# Patient Record
Sex: Female | Born: 1975
Health system: Southern US, Community
[De-identification: ages and names within clinical notes are randomized; demographics above are authoritative.]

## PROBLEM LIST (undated history)

## (undated) DIAGNOSIS — Z9289 Personal history of other medical treatment: Secondary | ICD-10-CM

## (undated) DIAGNOSIS — D649 Anemia, unspecified: Secondary | ICD-10-CM

## (undated) DIAGNOSIS — Z87440 Personal history of urinary (tract) infections: Secondary | ICD-10-CM

## (undated) DIAGNOSIS — I10 Essential (primary) hypertension: Secondary | ICD-10-CM

## (undated) DIAGNOSIS — T7840XA Allergy, unspecified, initial encounter: Secondary | ICD-10-CM

## (undated) HISTORY — DX: Anemia, unspecified: D64.9

## (undated) HISTORY — PX: BREAST BIOPSY: SHX20

## (undated) HISTORY — DX: Personal history of other medical treatment: Z92.89

## (undated) HISTORY — DX: Personal history of urinary (tract) infections: Z87.440

## (undated) HISTORY — PX: GANGLION CYST EXCISION: SHX1691

## (undated) HISTORY — DX: Allergy, unspecified, initial encounter: T78.40XA

## (undated) HISTORY — DX: Essential (primary) hypertension: I10

## (undated) HISTORY — PX: CARPAL TUNNEL RELEASE: SHX101

---

## 2001-01-20 HISTORY — PX: ABDOMINAL HYSTERECTOMY: SHX81

## 2002-06-13 ENCOUNTER — Encounter: Payer: Self-pay | Admitting: Obstetrics and Gynecology

## 2002-06-20 ENCOUNTER — Encounter (INDEPENDENT_AMBULATORY_CARE_PROVIDER_SITE_OTHER): Payer: Self-pay

## 2002-06-20 ENCOUNTER — Inpatient Hospital Stay (HOSPITAL_COMMUNITY): Admission: RE | Admit: 2002-06-20 | Discharge: 2002-06-22 | Payer: Self-pay | Admitting: Obstetrics and Gynecology

## 2009-01-20 ENCOUNTER — Emergency Department (HOSPITAL_BASED_OUTPATIENT_CLINIC_OR_DEPARTMENT_OTHER): Admission: EM | Admit: 2009-01-20 | Discharge: 2009-01-20 | Payer: Self-pay | Admitting: Emergency Medicine

## 2009-03-01 ENCOUNTER — Emergency Department (HOSPITAL_BASED_OUTPATIENT_CLINIC_OR_DEPARTMENT_OTHER): Admission: EM | Admit: 2009-03-01 | Discharge: 2009-03-01 | Payer: Self-pay | Admitting: Emergency Medicine

## 2009-04-10 ENCOUNTER — Ambulatory Visit: Payer: Self-pay | Admitting: Family

## 2009-04-10 ENCOUNTER — Ambulatory Visit: Payer: Self-pay | Admitting: Diagnostic Radiology

## 2009-04-10 ENCOUNTER — Ambulatory Visit (HOSPITAL_BASED_OUTPATIENT_CLINIC_OR_DEPARTMENT_OTHER): Admission: RE | Admit: 2009-04-10 | Discharge: 2009-04-10 | Payer: Self-pay | Admitting: Internal Medicine

## 2009-04-10 ENCOUNTER — Telehealth: Payer: Self-pay | Admitting: Family

## 2009-04-10 DIAGNOSIS — I10 Essential (primary) hypertension: Secondary | ICD-10-CM | POA: Insufficient documentation

## 2009-04-11 ENCOUNTER — Telehealth: Payer: Self-pay | Admitting: Family

## 2009-04-12 ENCOUNTER — Ambulatory Visit: Payer: Self-pay

## 2009-04-12 ENCOUNTER — Encounter: Payer: Self-pay | Admitting: Family

## 2009-04-30 ENCOUNTER — Telehealth: Payer: Self-pay | Admitting: Family

## 2009-05-02 ENCOUNTER — Telehealth: Payer: Self-pay | Admitting: Family

## 2009-05-10 ENCOUNTER — Ambulatory Visit: Payer: Self-pay | Admitting: Family

## 2009-05-11 ENCOUNTER — Telehealth: Payer: Self-pay | Admitting: Family

## 2009-05-11 ENCOUNTER — Encounter: Payer: Self-pay | Admitting: Family

## 2009-05-11 LAB — CONVERTED CEMR LAB
Chlamydia, DNA Probe: NEGATIVE
GC Probe Amp, Genital: NEGATIVE
Trich, Wet Prep: NONE SEEN
WBC, Wet Prep HPF POC: NONE SEEN
Yeast Wet Prep HPF POC: NONE SEEN

## 2009-05-14 ENCOUNTER — Telehealth (INDEPENDENT_AMBULATORY_CARE_PROVIDER_SITE_OTHER): Payer: Self-pay | Admitting: *Deleted

## 2009-05-15 ENCOUNTER — Telehealth: Payer: Self-pay | Admitting: Family

## 2009-05-16 ENCOUNTER — Encounter: Payer: Self-pay | Admitting: Family

## 2009-06-04 ENCOUNTER — Ambulatory Visit: Payer: Self-pay | Admitting: Family

## 2009-06-04 DIAGNOSIS — R5383 Other fatigue: Secondary | ICD-10-CM

## 2009-06-04 DIAGNOSIS — L708 Other acne: Secondary | ICD-10-CM | POA: Insufficient documentation

## 2009-06-04 DIAGNOSIS — R5381 Other malaise: Secondary | ICD-10-CM | POA: Insufficient documentation

## 2009-06-04 DIAGNOSIS — R7309 Other abnormal glucose: Secondary | ICD-10-CM | POA: Insufficient documentation

## 2009-06-04 LAB — CONVERTED CEMR LAB
Free T4: 0.85 ng/dL (ref 0.80–1.80)
Hgb A1c MFr Bld: 5.7 % — ABNORMAL HIGH (ref <5.7)
T3, Free: 2.8 pg/mL (ref 2.3–4.2)
TSH: 0.497 microintl units/mL (ref 0.350–4.500)
Testosterone: 51.58 ng/dL (ref 10–70)

## 2009-06-05 ENCOUNTER — Encounter: Payer: Self-pay | Admitting: Family

## 2009-06-29 ENCOUNTER — Telehealth: Payer: Self-pay | Admitting: Internal Medicine

## 2009-07-09 ENCOUNTER — Ambulatory Visit: Payer: Self-pay | Admitting: Family

## 2009-07-11 ENCOUNTER — Telehealth: Payer: Self-pay | Admitting: Family

## 2009-07-12 ENCOUNTER — Encounter: Payer: Self-pay | Admitting: Family

## 2009-07-13 ENCOUNTER — Encounter: Payer: Self-pay | Admitting: Family

## 2009-08-08 ENCOUNTER — Ambulatory Visit: Payer: Self-pay | Admitting: Family

## 2009-09-03 ENCOUNTER — Ambulatory Visit: Payer: Self-pay | Admitting: Family

## 2009-09-03 LAB — CONVERTED CEMR LAB
Trich, Wet Prep: NONE SEEN
WBC, Wet Prep HPF POC: NONE SEEN
Yeast Wet Prep HPF POC: NONE SEEN

## 2009-09-05 ENCOUNTER — Telehealth: Payer: Self-pay | Admitting: Family

## 2009-09-06 ENCOUNTER — Telehealth: Payer: Self-pay | Admitting: Family

## 2009-10-17 ENCOUNTER — Telehealth: Payer: Self-pay | Admitting: Family

## 2009-10-19 ENCOUNTER — Ambulatory Visit: Payer: Self-pay | Admitting: Family

## 2009-10-19 LAB — CONVERTED CEMR LAB
Candida species: NEGATIVE
Gardnerella vaginalis: POSITIVE — AB
Trichomonal Vaginitis: NEGATIVE

## 2009-10-22 ENCOUNTER — Telehealth: Payer: Self-pay | Admitting: Family

## 2009-11-22 ENCOUNTER — Telehealth: Payer: Self-pay | Admitting: Family

## 2009-12-21 ENCOUNTER — Telehealth: Payer: Self-pay | Admitting: Family

## 2010-02-10 ENCOUNTER — Encounter: Payer: Self-pay | Admitting: Obstetrics and Gynecology

## 2010-02-11 ENCOUNTER — Telehealth: Payer: Self-pay | Admitting: Family

## 2010-02-17 LAB — CONVERTED CEMR LAB
ALT: 13 units/L (ref 0–35)
AST: 17 units/L (ref 0–37)
Albumin: 4.1 g/dL (ref 3.5–5.2)
Alkaline Phosphatase: 56 units/L (ref 39–117)
BUN: 9 mg/dL (ref 6–23)
Basophils Absolute: 0 10*3/uL (ref 0.0–0.1)
Basophils Relative: 0 % (ref 0–1)
CO2: 21 meq/L (ref 19–32)
Calcium: 9.2 mg/dL (ref 8.4–10.5)
Chloride: 103 meq/L (ref 96–112)
Creatinine, Ser: 0.76 mg/dL (ref 0.40–1.20)
Eosinophils Absolute: 0.1 10*3/uL (ref 0.0–0.7)
Eosinophils Relative: 1 % (ref 0–5)
Glucose, Bld: 103 mg/dL — ABNORMAL HIGH (ref 70–99)
HCT: 36.4 % (ref 36.0–46.0)
Hemoglobin: 12.5 g/dL (ref 12.0–15.0)
Lymphocytes Relative: 35 % (ref 12–46)
Lymphs Abs: 2.7 10*3/uL (ref 0.7–4.0)
MCHC: 34.3 g/dL (ref 30.0–36.0)
MCV: 89.2 fL (ref 78.0–100.0)
Monocytes Absolute: 0.5 10*3/uL (ref 0.1–1.0)
Monocytes Relative: 6 % (ref 3–12)
Neutro Abs: 4.5 10*3/uL (ref 1.7–7.7)
Neutrophils Relative %: 58 % (ref 43–77)
Platelets: 338 10*3/uL (ref 150–400)
Potassium: 3.8 meq/L (ref 3.5–5.3)
Pro B Natriuretic peptide (BNP): 65.8 pg/mL (ref 0.0–100.0)
RBC: 4.08 M/uL (ref 3.87–5.11)
RDW: 12.9 % (ref 11.5–15.5)
Sodium: 137 meq/L (ref 135–145)
TSH: 0.509 microintl units/mL (ref 0.350–4.500)
Total Bilirubin: 0.2 mg/dL — ABNORMAL LOW (ref 0.3–1.2)
Total Protein: 7.2 g/dL (ref 6.0–8.3)
Trich, Wet Prep: NONE SEEN
WBC, Wet Prep HPF POC: NONE SEEN
WBC: 7.8 10*3/uL (ref 4.0–10.5)
Yeast Wet Prep HPF POC: NONE SEEN

## 2010-02-19 NOTE — Assessment & Plan Note (Signed)
Summary: weight gain /facial break out/  ?HRT/hea   Vital Signs:  Patient profile:   35 year old female Height:      65 inches Weight:      199.50 pounds BMI:     33.32 Temp:     98.5 degrees F oral Pulse rate:   84 / minute Pulse rhythm:   regular Resp:     12 per minute BP sitting:   132 / 78  (right arm) Cuff size:   regular  Vitals Entered By: Mervin Kung CMA (Jun 04, 2009 1:15 PM) CC: room 4  Pt would like hormones checked. Still having facial breakouts, mood swings and fluctuations with weight. Also states that bacterial infection is improved but not completely resolved. Is Patient Diabetic? No   CC:  room 4  Pt would like hormones checked. Still having facial breakouts and mood swings and fluctuations with weight. Also states that bacterial infection is improved but not completely resolved.Marland Kitchen  History of Present Illness: [name] is a pleasant 35 year old female who presents today with several complaints:  1)mood swings- feels teary.  Wants to sleep, wants to be alone often,   + fatigue.  + anxiety, occasional panic attacks. These symptoms have been present for several months. Denies suicide ideation.  2)Acne- she is seeing dermatology and was placed on OCP.  This is not helping her acne.  She is using an OTC wash.  "I feel like I am a teenager again!"  3)Bacterial Vaginosis- Last visit wet prep noted + clue cells.  She was treated with metronidazole tabs (can't affort co-pay for metrogel).  Noted improvment in her symptoms, but not resolved.    4) Weight fluctuations- notes that her weight can vary up to 7 pounds within a few days.  Eating healthier, exercising, denies LE edema.    Allergies: 1)  ! Ace Inhibitors  Physical Exam  General:  Well-developed,well-nourished,in no acute distress; alert,appropriate and cooperative throughout examination Head:  Normocephalic and atraumatic without obvious abnormalities. No apparent alopecia or balding. Lungs:  Normal  respiratory effort, chest expands symmetrically. Lungs are clear to auscultation, no crackles or wheezes. Heart:  Normal rate and regular rhythm. S1 and S2 normal without gallop, murmur, click, rub or other extra sounds. Extremities:  No LE edema Skin:  + acne noted on face with some associated erythema.   Impression & Recommendations:  Problem # 1:  ANXIETY DEPRESSION (ICD-300.4) Assessment New Suspect that patient is suffering from symptoms of both Depression and Anxiety.  Will likely benefit from addition of SSRI.  Pt instructed on common side effects and red flags per pt. instructions.  Plan f/u in 1 month.   Problem # 2:  VAGINITIS (ICD-616.10) Assessment: Improved Improved but not clinically resolved.  Will give addition rx of metronidazole.  The following medications were removed from the medication list:    Metronidazole 500 Mg Tabs (Metronidazole) ..... One tab by mouth three times a day x 7 days Her updated medication list for this problem includes:    Metronidazole 500 Mg Tabs (Metronidazole) ..... One tablet by mouth three times a day x 7 days  Problem # 3:  OTHER ACNE (ICD-706.1) Assessment: Deteriorated Will add Benzamycin gel.  Cont OCP, will check testosterone level.  The following medications were removed from the medication list:    Metronidazole 500 Mg Tabs (Metronidazole) ..... One tab by mouth three times a day x 7 days Her updated medication list for this problem includes:  Metronidazole 500 Mg Tabs (Metronidazole) ..... One tablet by mouth three times a day x 7 days    Benzoyl Peroxide-erythromycin 5-3 % Gel (Benzoyl peroxide-erythromycin) .Marland Kitchen... Wash and dry skin, then apply gel twice daily  Orders: T-Testosterone; Total [phone])  Problem # 4:  FATIGUE (ICD-780.79) Assessment: New May be related to depression.  Also TSH was WNL but borderline low last draw.  Will repeat today along with free T3/T4.  Will also check A1C as sugar was slightly elevated  last visit.  Per our records weight overall is actually down.    Complete Medication List: 1)  Hydrochlorothiazide 25 Mg Tabs (Hydrochlorothiazide) .... Take 1 tablet by mouth once a day 2)  Daily Multi Tabs (Multiple vitamins-minerals) .... Take 1 tablet by mouth once a day 3)  Ortho Tri-cyclen (28) 0.18/0.215/0.25 Mg-35 Mcg Tabs (Norgestim-eth estrad triphasic) .... Take as direct 4)  Celexa 20 Mg Tabs (Citalopram hydrobromide) .... One half tablet by mouth daily x 1 week 5)  Metronidazole 500 Mg Tabs (Metronidazole) .... One tablet by mouth three times a day x 7 days 6)  Benzoyl Peroxide-erythromycin 5-3 % Gel (Benzoyl peroxide-erythromycin) .... Wash and dry skin, then apply gel twice daily  Other Orders: T-TSH ([id number]) T-T3, Free [id number]) T-T4, Free [phone] [id number]) T-Hgb A1C ([id number])  Patient Instructions: 1)  Citalopram: please start 1/2 tablet one a day for one week, then increase to a full tablet. 2)  It will likely take several weeks before you will notice improvement. 3)  Side effects of this medicine may include drowsiness or nausea.  If this becomes an issue for you call for further instructions. 4)  Very rarely people may develop suicidal thoughts when taking these types of medicines- should this happen to you, discontinue medication and go directly to the emergency room. 5)  Please arrange a follow up appointment in 1 month  Prescriptions: BENZOYL PEROXIDE-ERYTHROMYCIN 5-3 % GEL (BENZOYL PEROXIDE-ERYTHROMYCIN) Wash and dry skin, then apply gel twice daily  #1 x 5   Entered and Authorized by:   Lemont Fillers FNP   Signed by:   Lemont Fillers FNP on 06/04/2009   Method used:   Electronically to        [facility]. [phone]* (retail)       [address]       [place], [place]  [address]       Ph: [phone]       Fax: [phone]   RxID:   [id number] METRONIDAZOLE 500 MG TABS (METRONIDAZOLE) one tablet by mouth three times a day x 7 days   #21 x 0   Entered and Authorized by:   Lemont Fillers FNP   Signed by:   Lemont Fillers FNP on 06/04/2009   Method used:   Electronically to        [facility] [phone]* (retail)       [address]       [place], [place]  [address]       Ph: [phone]       Fax: [phone]   RxID:   [id number] CELEXA 20 MG TABS (CITALOPRAM HYDROBROMIDE) one half tablet by mouth daily x 1 week  #30 x 1   Entered and Authorized by:   Lemont Fillers FNP   Signed by:   Lemont Fillers FNP on 06/04/2009   Method used:   Electronically to        [facility] [address] [id number]* (retail)       [address]       [place], [place]  [address]       Ph: [phone]       Fax: [phone]   RxID:   [id number]   Current Allergies (reviewed today): ! ACE INHIBITORS

## 2010-02-19 NOTE — Progress Notes (Signed)
Summary: refill--metronidazole  Phone Note Refill Request Message from:  Patient on November 22, 2009 10:22 AM  Refills Requested: Medication #1:  METRONIDAZOLE 500 MG TABS one tablet by mouth two times a day x 7 days.   Dosage confirmed as above?Dosage Confirmed Pt states she is having same symptoms as before and would like refill sent to pharmacy. "Does not feel she needs to be seen as her symptoms are the same". Please advise.  Initial call taken by: Mervin Kung CMA Duncan Dull),  November 22, 2009 10:24 AM  Follow-up for Phone Call        Metronidazole was sent to her pharmacy.  If symptoms not resolved in 1 week she will need to be seen in office. Follow-up by: Lemont Fillers FNP,  November 22, 2009 10:45 AM  Additional Follow-up for Phone Call Additional follow up Details #1::        Attempted to reach pt and received message that service is temporarily unavailable. Nicki Guadalajara Fergerson CMA Duncan Dull)  November 22, 2009 11:35 AM     Additional Follow-up for Phone Call Additional follow up Details #2::    Pt notified and voices understanding. Nicki Guadalajara Fergerson CMA Duncan Dull)  November 22, 2009 2:59 PM   Prescriptions: METRONIDAZOLE 500 MG TABS (METRONIDAZOLE) one tablet by mouth two times a day x 7 days  #14 x 0   Entered and Authorized by:   Lemont Fillers FNP   Signed by:   Lemont Fillers FNP on 11/22/2009   Method used:   Electronically to        [facility] [phone]* (retail)       [address]       [place], [place]  [address]       Ph: [phone]       Fax: [phone]   RxID:   [id number]

## 2010-02-19 NOTE — Miscellaneous (Signed)
Summary: Orders Update  Clinical Lists Changes  Orders: Added new Test order of Venous Duplex Lower Extremity (Venous Duplex Lower) - Signed 

## 2010-02-19 NOTE — Progress Notes (Signed)
Summary: Lab Results  Phone Note Call from Patient Call back at Work Phone [phone]   Caller: Patient Call For: Lemont Fillers FNP Reason for Call: Lab or Test Results Summary of Call: patient called and left voice message requesting lab results. Initial call taken by: Glendell Docker CMA,  September 05, 2009 11:57 AM  Follow-up for Phone Call        Pls call lab to request wet prep results.  Follow-up by: Lemont Fillers FNP,  September 05, 2009 12:02 PM  Additional Follow-up for Phone Call Additional follow up Details #1::        Lab results have not come back yet, results should be available by tomorrow or Friday Additional Follow-up by: Brenton Grills MA,  September 05, 2009 12:09 PM    Additional Follow-up for Phone Call Additional follow up Details #2::    Called pt. She is feeling pretty good- only mild vaginal irritation. Will call back when results become available. Follow-up by: Lemont Fillers FNP,  September 05, 2009 12:13 PM

## 2010-02-19 NOTE — Progress Notes (Signed)
Summary: Medication effects on employment drug screening  Phone Note Call from Patient   Caller: Patient Call For: Lemont Fillers FNP Summary of Call: Pt has signed a HIPAA release form 4.8.11, pls call Kandice Hams at [phone] to advise if pt's current medications will dilute a urine drug test. Initial call taken by: Lannette Donath,  May 02, 2009 2:27 PM  Follow-up for Phone Call        If employer has not yet done so, they will need to fax [place] HIPPA release form prior to [place] releasing medical info. Follow-up by: Lemont Fillers FNP,  May 02, 2009 3:38 PM  Additional Follow-up for Phone Call Additional follow up Details #1::        Pt states she dropped off HIPAA paperwork Friday 11th, I left a message for Medical Records at [facility] to call me back to see if they have her form, I'm not sure where else it would be. I'll send this to Marj too to see if she knows Diane Tomerlin  May 02, 2009 4:15 PM    Additional Follow-up for Phone Call Additional follow up Details #2::    Called patient.  She tells me that she was told by her employer that her urine was found to be diluted.  She signed medical release form and given me permission to call Kandice Hams and let her know that she is on a diurectic and that this may dilute her urine.  I have called Ms. Pricilla Holm and advised as such.  Follow-up by: Lemont Fillers FNP,  May 02, 2009 5:04 PM

## 2010-02-19 NOTE — Miscellaneous (Signed)
Summary: Controlled Substance Agreement  Controlled Substance Agreement   Imported By: Lanelle Bal 07/18/2009 08:51:00  _____________________________________________________________________  External Attachment:    Type:   Image     Comment:   External Document

## 2010-02-19 NOTE — Progress Notes (Signed)
Summary: yeast inf?  Phone Note Call from Patient Call back at [phone]  wk   Caller: Patient Call For: Lemont Fillers FNP Summary of Call: Pt called stating that she has been taking an abx from her dermatologist and she thinks she has yeast infection now.  Requesting rx for Diflucan.  Please advise.  Mervin Kung CMA  June 29, 2009 11:20 AM   Follow-up for Phone Call        Pt notified that rx had been sent to pharmacy.  Nicki Guadalajara Fergerson CMA  June 29, 2009 2:55 PM     New/Updated Medications: FLUCONAZOLE 150 MG TABS (FLUCONAZOLE) one by mouth once daily Prescriptions: FLUCONAZOLE 150 MG TABS (FLUCONAZOLE) one by mouth once daily  #2 x 0   Entered and Authorized by:   D. Thomos Lemons DO   Signed by:   D. Thomos Lemons DO on 06/29/2009   Method used:   Electronically to        [facility] [phone]* (retail)       [address]       [place], [place]  [address]       Ph: [phone]       Fax: [phone]   RxID:   [id number]

## 2010-02-19 NOTE — Progress Notes (Signed)
Summary: needs appt.  Phone Note Call from Patient   Caller: Patient Call For: Lemont Fillers FNP Summary of Call: Pt. requesting refill on Fluconazole.  She states that her symptoms have not gone away.  We gave her an rx. on 04/10/09 with 1 refill and she has used that refill as well.  Please advise?  Mervin Kung CMA  April 30, 2009 1:19 PM   Follow-up for Phone Call        Did you complete metronidazole?  If so she should return for follow up- wet prep showed bacteria rather than yeast last visit.   Follow-up by: Lemont Fillers FNP,  April 30, 2009 1:25 PM  Additional Follow-up for Phone Call Additional follow up Details #1::        Left message on machine to return call. Refill denial sent to pharmacy.  Mervin Kung CMA  April 30, 2009 1:53 PM   Advised pt. she needs to f/'u in the office since she still has symptoms.  Appt. made for 05/01/09 with Irja Wheless at 1:15pm.  Mervin Kung CMA  April 30, 2009 2:34 PM

## 2010-02-19 NOTE — Progress Notes (Signed)
Summary: refill Metronidazole  Phone Note Refill Request Call back at [phone] Message from:  Pharmacy on October 17, 2009 8:32 AM  Refills Requested: Medication #1:  METRONIDAZOLE 500 MG TABS one tablet by mouth two times a day x 7 days- do not drink alcohol while on this medication.   Dosage confirmed as above?Dosage Confirmed   Last Refilled: 09/07/2009 Left message for pt to return my call. If needs refill on antibiotic, pt will need to be seen. Nicki Guadalajara Fergerson CMA Duncan Dull)  October 17, 2009 8:33 AM    Follow-up for Phone Call        patient returned call stated her symptoms have not completely resolved she wanted to know if treatment could be repeated Follow-up by: Glendell Docker CMA,  October 17, 2009 1:05 PM  Additional Follow-up for Phone Call Additional follow up Details #1::        I will need to repeat a wet prep to determine what medication will best treat her symptoms.  Please have pt schedule a f/u visit. Additional Follow-up by: Lemont Fillers FNP,  October 17, 2009 1:12 PM    Additional Follow-up for Phone Call Additional follow up Details #2::    Advised pt per [facility] instructions. Pt scheduled f/u for 9/30 @ 9am. Mervin Kung CMA Duncan Dull)  October 18, 2009 9:55 AM

## 2010-02-19 NOTE — Progress Notes (Signed)
Summary: [facility] Refill  Phone Note Call from Patient Call back at Work Phone [phone]   Caller: Patient Call For: Lemont Fillers FNP Summary of Call: patient called and left voice message requesting a refill on  HCTZ. She states she is out of her medication and she is needing refills to her pharmacy Initial call taken by: Glendell Docker CMA,  May 15, 2009 1:03 PM  Follow-up for Phone Call        pls call pt and notify her that rx has been sent to pharmacy Follow-up by: Lemont Fillers FNP,  May 15, 2009 1:18 PM    Prescriptions: HYDROCHLOROTHIAZIDE 25 MG TABS (HYDROCHLOROTHIAZIDE) Take 1 tablet by mouth once a day  #30 x 3   Entered and Authorized by:   Lemont Fillers FNP   Signed by:   Lemont Fillers FNP on 05/15/2009   Method used:   Electronically to        [facility] [phone]* (retail)       [address]       [place], [place]  [address]       Ph: [phone]       Fax: [phone]   RxID:   [id number]   Appended Document: [facility] Refill Left message on machine that rx was sent to pharmacy and to call back if she has any questions.

## 2010-02-19 NOTE — Progress Notes (Signed)
----   Converted from flag ---- ---- 05/14/2009 10:42 AM, Lannette Donath wrote: Pls call Tonya back at [facility] re. lab test [phone]. ------------------------------ Spoke to [facility] @ [facility] and confirmed culture should be for GC/chlamydia per office note of 05/10/09.

## 2010-02-19 NOTE — Progress Notes (Signed)
  Phone Note Outgoing Call   Summary of Call: left message for patient to pick up rx at pharmacy. Initial call taken by: Lemont Fillers FNP,  May 11, 2009 11:43 AM    New/Updated Medications: METRONIDAZOLE 500 MG TABS (METRONIDAZOLE) one tab by mouth three times a day x 7 days Prescriptions: METRONIDAZOLE 500 MG TABS (METRONIDAZOLE) one tab by mouth three times a day x 7 days  #21 x 0   Entered and Authorized by:   Lemont Fillers FNP   Signed by:   Lemont Fillers FNP on 05/11/2009   Method used:   Electronically to        [facility] [phone]* (retail)       [address]       [place], [place]  [address]       Ph: [phone]       Fax: [phone]   RxID:   [id number]

## 2010-02-19 NOTE — Assessment & Plan Note (Signed)
Summary: STILL HAS INFECTION/DT   Vital Signs:  Patient profile:   35 year old female Weight:      204.75 pounds BMI:     34.20 O2 Sat:      100 % on Room air Temp:     98.2 degrees F oral Pulse rate:   86 / minute Pulse rhythm:   regular Resp:     16 per minute BP sitting:   124 / 82  (right arm) Cuff size:   large  Vitals Entered By: Glendell Docker CMA (May 10, 2009 1:12 PM)  O2 Flow:  Room air CC: Rm 2- vaginal discharge and odor Comments vaginal discharge cottage chees like, ongoing for the past week   CC:  Rm 2- vaginal discharge and odor.  History of Present Illness: [name] is a 35 year old female who presents today with complaint of thick white vaginal discharge, fishy odor.  She was treated in the end of March with metronidazole for BV.  Noted initially that these symptoms improved.   Denies abdominal pain or dyspareunia.    Allergies: 1)  ! Ace Inhibitors  Physical Exam  General:  Well-developed,well-nourished,in no acute distress; alert,appropriate and cooperative throughout examination Genitalia:  normal introitus and no external lesions.  Thin white discharge is noted.  + Fishy odor.   Psych:  Cognition and judgment appear intact. Alert and cooperative with normal attention span and concentration. No apparent delusions, illusions, hallucinations   Impression & Recommendations:  Problem # 1:  VAGINITIS (ICD-616.10) Assessment Deteriorated Wet prep and Gc/Chlamydia DNA probe sent to lab.  Will plan to treat accordingly.  The following medications were removed from the medication list:    Metronidazole 500 Mg Tabs (Metronidazole) ..... One tab by mouth two times a day x 7 days  Complete Medication List: 1)  Hydrochlorothiazide 25 Mg Tabs (Hydrochlorothiazide) .... Take 1 tablet by mouth once a day 2)  Daily Multi Tabs (Multiple vitamins-minerals) .... Take 1 tablet by mouth once a day 3)  Nouritress (hair Vitamin)  .... Take 1 tablet by mouth once a  day 4)  Ortho Tri-cyclen (28) 0.18/0.215/0.25 Mg-35 Mcg Tabs (Norgestim-eth estrad triphasic) .... Take as direct  Patient Instructions: 1)  I will call you with your results. 2)  Have a nice Easter.  Current Allergies (reviewed today): ! ACE INHIBITORS  Appended Document: Orders Update    Clinical Lists Changes  Orders: Added new Test order of T-Wet Prep by Molecular Probe [phone]) - Signed Added new Test order of T-Culture, GC Screen [phone]) - Signed Added new Service order of Specimen Handling ([id number]) - Signed

## 2010-02-19 NOTE — Progress Notes (Signed)
Summary: Cymbalta prior auth.  Phone Note Call from Patient Call back at [phone]   Caller: Patient Call For: Lemont Fillers FNP Summary of Call: Pt states her insurance does not cover Cymbalta and would like replacement. Please advise.  Mervin Kung CMA  July 11, 2009 1:12 PM   Follow-up for Phone Call        Spoke to [place] at [phone] [facility]). Cymbalta is covered for once daily dosing. Prior auth for Cymbalta 30mg  bid has been initiated. Case # [id number]; form will be faxed.  Mervin Kung CMA  July 11, 2009 2:39 PM   Additional Follow-up for Phone Call Additional follow up Details #1::        Spoke to pt. and advised her of prior auth process.  Notified pt. we have samples she can pick up at the front desk to take until insurance reaches a determination.  Mervin Kung CMA  July 11, 2009 2:52 PM     Additional Follow-up for Phone Call Additional follow up Details #2::    Form completed and faxed to [employer] at [phone]. Awaiting determination.  Mervin Kung CMA  July 12, 2009 2:32 PM     Additional Follow-up for Phone Call Additional follow up Details #3:: Details for Additional Follow-up Action Taken: Per Tyler Deis (pharmacist) @ [employer], Cymbalta 30mg  two times a day has been approved until 08/13/09.  If pt. tolerates two times a day dose then ins. will cover 60mg  once daily without need for prior auth. Left message of approval on [facility] voicemail. Advised pt. of approval and scheduled pt. a 1 month f/u for 08/08/09 @ 2:15pm.  Mervin Kung CMA  July 17, 2009 9:08 AM    Appended Document: Cymbalta prior auth. Received determination letter from [employer] stating Cymbalta 30mg  two times a day has been approved until 08/11/09; not 08/13/09 as stated above.

## 2010-02-19 NOTE — Assessment & Plan Note (Signed)
Summary: 1 MONTH FU/DT   Vital Signs:  Patient profile:   35 year old female Weight:      194.50 pounds BMI:     32.48 O2 Sat:      98 % on Room air Temp:     98.0 degrees F oral Pulse rate:   64 / minute Pulse rhythm:   regular Resp:     16 per minute BP sitting:   114 / 80  (right arm) Cuff size:   regular  Vitals Entered By: Glendell Docker CMA (July 09, 2009 9:50 AM)  O2 Flow:  Room air CC: Rm 5-1 Month Follow  Comments  medications reviewed, refill on Celexa, completed Fluconazole, and metronidazole, no longer taking Ortho Tri Cyclen   CC:  Rm 5-1 Month Follow .  History of Present Illness: [name] is a 35 year old female who presents today for follow up of her depression.  She notes some improvement since starting celexa, but is still having crying spells on a daily basis.  She often wishes to be alone.  Notes + anxiety at times (several times a week).  These episodes of anxiety are accompanied by crying and heart racing.  She reports that these episodes happens most often at home. Overall, she feels that her home life and work life are going pretty well.  She notes some stress due to her teenage children.  Patient denies suicide ideation.  Allergies: 1)  ! Ace Inhibitors  Physical Exam  General:  Well-developed,well-nourished,in no acute distress; alert,appropriate and cooperative throughout examination Psych:  Cognition and judgment appear intact. Alert and cooperative with normal attention span and concentration. No apparent delusions, illusions, hallucinations   Impression & Recommendations:  Problem # 1:  ANXIETY DEPRESSION (ICD-300.4) Assessment Improved Slight improvement in patient's depression and anxiety, however improvement is not yet satisfactory.  Will plan to change citalopram to Cymbalta and add in as needed klonopin to be used for anxiety.  Plan f/u in 1 month.  If no improvement at that time, will consider referral to psychiatry.  We did discuss  possibility of referral to counseling, however patient notes that she has undergone counseling in the past and that she did not find it to be very helpful.  She declines therapy referral at this time.  15 minutes was spent with patient.  Greater than 50% of this time was spent counseling patient on her depression and anxiety.    Complete Medication List: 1)  Hydrochlorothiazide 25 Mg Tabs (Hydrochlorothiazide) .... Take 1 tablet by mouth once a day 2)  Daily Multi Tabs (Multiple vitamins-minerals) .... Take 1 tablet by mouth once a day 3)  Ortho Tri-cyclen (28) 0.18/0.215/0.25 Mg-35 Mcg Tabs (Norgestim-eth estrad triphasic) .... Take as direct 4)  Celexa 20 Mg Tabs (Citalopram hydrobromide) .... One half tablet by mouth daily x 1 week 5)  Benzoyl Peroxide-erythromycin 5-3 % Gel (Benzoyl peroxide-erythromycin) .... Wash and dry skin, then apply gel twice daily 6)  Fluconazole 150 Mg Tabs (Fluconazole) .... One by mouth once daily 7)  Cymbalta 30 Mg Cpep (Duloxetine hcl) .... One tablet by mouth daily for one week, then increase to 2 tablet by mouth daily on second week 8)  Klonopin 0.5 Mg Tabs (Clonazepam) .... One tablet by mouth three times daily as needed for anxiety 9)  Solodyn 105 Mg Xr24h-tab (Minocycline hcl) .... One tablet by mouth daily  Patient Instructions: 1)  Stop Celexa, start Cymbalta. 2)  Please follow up in 1 month- sooner  if problems or concerns. Prescriptions: KLONOPIN 0.5 MG TABS (CLONAZEPAM) one tablet by mouth three times daily as needed for anxiety  #30 x 0   Entered and Authorized by:   Lemont Fillers FNP   Signed by:   Lemont Fillers FNP on 07/09/2009   Method used:   Print then Give to Patient   RxID:   [id number] CYMBALTA 30 MG CPEP (DULOXETINE HCL) one tablet by mouth daily for one week, then increase to 2 tablet by mouth daily on second week  #60 x 1   Entered and Authorized by:   Lemont Fillers FNP   Signed by:   Lemont Fillers FNP  on 07/09/2009   Method used:   Electronically to        [facility] [phone]* (retail)       [address]       [place], [place]  [address]       Ph: [phone]       Fax: [phone]   RxID:   [id number]

## 2010-02-19 NOTE — Medication Information (Signed)
Summary: Prior Authorization for Cymbalta/[employer]  Prior Authorization for Cymbalta/[employer]   Imported By: Lanelle Bal 07/30/2009 13:45:14  _____________________________________________________________________  External Attachment:    Type:   Image     Comment:   External Document

## 2010-02-19 NOTE — Progress Notes (Signed)
Summary: refill--metronidazole, needs appt  Phone Note Call from Patient Call back at [phone]   Caller: Patient Call For: Lemont Fillers FNP Summary of Call: Received voice message from pt stating that the pharmacy had sent [place] a request for medication refill on Monday and said we have not responded. Left message on pt's voicemail that we refilled HCTZ on 12/17/09 and have not received any other refill requests since. Advised pt to call us if needs other refills. Nicki Guadalajara Fergerson CMA Duncan Dull)  December 21, 2009 2:30 PM   Follow-up for Phone Call        Pt returned my call and states she needs refill on metronidazole. Advised pt per last phone note that she would need to be seen. Pt at work until [employer] today. Pt states that the infection seems to clear for a short while and then returns. Please advise. Nicki Guadalajara Fergerson CMA Duncan Dull)  December 21, 2009 2:34 PM   Additional Follow-up for Phone Call Additional follow up Details #1::        Please have her schedule apt first thing on Monday morning.  We really need to do another wet prep.  If symptoms worsen she can be seen in saturday AM clinic or urgent care. Additional Follow-up by: Lemont Fillers FNP,  December 21, 2009 2:57 PM    Additional Follow-up for Phone Call Additional follow up Details #2::    Advised pt per [facility] instructions. Pt states she will see about going to the clinic at [facility]. Mervin Kung CMA Duncan Dull)  December 21, 2009 3:03 PM

## 2010-02-19 NOTE — Assessment & Plan Note (Signed)
Summary: 1 month fu/dt   Vital Signs:  Patient profile:   35 year old female Height:      65 inches (165.10 cm) Weight:      194.25 pounds (88.30 kg) BMI:     32.44 Temp:     98.0 degrees F (36.67 degrees C) oral Pulse rate:   86 / minute BP sitting:   122 / 88  (right arm) Cuff size:   large  Vitals Entered By: Brenton Grills MA (September 03, 2009 3:56 PM) CC: 1 month F/U/aj, Depression   Primary Care Ja Ohman:  Lemont Fillers FNP  CC:  1 month F/U/aj and Depression.  History of Present Illness: [name] is a 35 year old female who presents today for follow up of her depression. Overall, she notes that  her depression is improving.  Not quite where she would like to be- notes that several times a week, she notes that she feels down, wants to be alone.  Notes that her anxiety has improved- rarely needign to take the klonopin.  Denies any significant life  stressors.     Vaginal Candidiasis-  Patient notes that she continues to have vaginal itching/discharge, no improvment with the weekly diflucan.   Depression History:      Positive alarm features for depression include fatigue (loss of energy).  However, she denies significant weight loss, significant weight gain, insomnia, impaired concentration (indecisiveness), and recurrent thoughts of death or suicide.         Current Medications (verified): 1)  Hydrochlorothiazide 25 Mg Tabs (Hydrochlorothiazide) .... Take 1 Tablet By Mouth Once A Day 2)  Daily Multi  Tabs (Multiple Vitamins-Minerals) .... Take 1 Tablet By Mouth Once A Day 3)  Ortho Tri-Cyclen (28) 0.18/0.215/0.25 Mg-35 Mcg Tabs (Norgestim-Eth Estrad Triphasic) .... Take As Direct 4)  Benzoyl Peroxide-Erythromycin 5-3 % Gel (Benzoyl Peroxide-Erythromycin) .... Wash and Dry Skin, Then Apply Gel Twice Daily 5)  Fluconazole 150 Mg Tabs (Fluconazole) .... One By Mouth Once Weekly As Needed For Vaginal Itching/discharge 6)  Celexa 20 Mg Tabs (Citalopram Hydrobromide)  .... One Tablet By Mouth Daily 7)  Klonopin 0.5 Mg Tabs (Clonazepam) .... One Tablet By Mouth Three Times Daily As Needed For Anxiety 8)  Solodyn 105 Mg Xr24h-Tab (Minocycline Hcl) .... One Tablet By Mouth Daily  Allergies (verified): 1)  ! Ace Inhibitors  Physical Exam  General:  Well-developed,well-nourished,in no acute distress; alert,appropriate and cooperative throughout examination Head:  Normocephalic and atraumatic without obvious abnormalities. No apparent alopecia or balding. Abdomen:  Bowel sounds positive,abdomen soft and non-tender without masses, organomegaly or hernias noted. Genitalia:  Pelvic Exam:        External: normal female genitalia without lesions or masses        Vagina: normal without lesions or masses        Cervix: surgically absent        Adnexa: normal bimanual exam without masses or fullness        Uterus:surgically absent        Pap smear: not performed   Impression & Recommendations:  Problem # 1:  ANXIETY DEPRESSION (ICD-300.4) Assessment Improved Improving, depression is still not quite where she would like to be.  Will try increasing citalopram to 40 mg. Pt to follow up in 2 months.    Problem # 2:  VAGINITIS (ICD-616.10) Assessment: Deteriorated Will send wet prep and will plan to treat accordingly. Her updated medication list for this problem includes:    Solodyn 105 Mg Xr24h-tab (  Minocycline hcl) ..... One tablet by mouth daily  Orders: T-Wet Prep by [facility] [phone]) Specimen Handling (29[address])  Complete Medication List: 1)  Hydrochlorothiazide 25 Mg Tabs (Hydrochlorothiazide) .... Take 1 tablet by mouth once a day 2)  Daily Multi Tabs (Multiple vitamins-minerals) .... Take 1 tablet by mouth once a day 3)  Ortho Tri-cyclen (28) 0.18/0.215/0.25 Mg-35 Mcg Tabs (Norgestim-eth estrad triphasic) .... Take as direct 4)  Benzoyl Peroxide-erythromycin 5-3 % Gel (Benzoyl peroxide-erythromycin) .... Wash and dry skin, then apply gel  twice daily 5)  Fluconazole 150 Mg Tabs (Fluconazole) .... One by mouth once weekly as needed for vaginal itching/discharge 6)  Citalopram Hydrobromide 40 Mg Tabs (Citalopram hydrobromide) .... One tablet by mouth daily 7)  Klonopin 0.5 Mg Tabs (Clonazepam) .... One tablet by mouth three times daily as needed for anxiety 8)  Solodyn 105 Mg Xr24h-tab (Minocycline hcl) .... One tablet by mouth daily  Patient Instructions: 1)  We will contact you with the results of your vaginal swab. 2)  Please plan to follow up in 2 months for your depression, sooner if your symptoms worsen or do not improve.  Prescriptions: CITALOPRAM HYDROBROMIDE 40 MG TABS (CITALOPRAM HYDROBROMIDE) one tablet by mouth daily  #30 x 2   Entered and Authorized by:   Lemont Fillers FNP   Signed by:   Lemont Fillers FNP on 09/03/2009   Method used:   Electronically to        [facility] [phone]* (retail)       [address]       [place], [place]  [address]       Ph: [phone]       Fax: [phone]   RxID:   [id number]

## 2010-02-19 NOTE — Assessment & Plan Note (Signed)
Summary: 1 month f/u / tf,cma--Rm 4   Vital Signs:  Patient profile:   35 year old female Height:      65 inches Weight:      200 pounds BMI:     33.40 Temp:     98.1 degrees F oral Pulse rate:   84 / minute Pulse rhythm:   regular Resp:     18 per minute BP sitting:   136 / 86  (right arm) Cuff size:   large  Vitals Entered By: Mervin Kung CMA Duncan Dull) (August 08, 2009 2:21 PM) CC: Room 4  1 month follow up. Pt states she is more moody on Cymbalta than with Celexa. Will need refill on HCTZ if pt. is to continue taking it. Is Patient Diabetic? No Comments Pt has completed Celexa, Benzoyl Peroxide-erythromycin and Fluconazole. Nicki Guadalajara Fergerson CMA (AAMA)  August 08, 2009 2:28 PM    CC:  Room 4  1 month follow up. Pt states she is more moody on Cymbalta than with Celexa. Will need refill on HCTZ if pt. is to continue taking it.Marland Kitchen  History of Present Illness: [name] is a 35 year old female who presents today for follow up of her anxiety and depression.  Last visit she was noted to have some improvement in her overall depression, but was still having crying spells nearly every day.  She was switched to cymbalta from Celexa.  Since switching to cymbalta she reports that her crying spells are less frequent but overall  she feels more irritable. Reports that she felt better on the Citalopram.  She denies suicide ideation.  Overall anxiety has improved, reports that she rarely needs the klonopin.    Acne- notes that her dermatologist has placed her on minocycline for her acne and plans to continue her on this medication for 4-5 months.  Notes that since she started the abx she has had frequent yeast infections (reports + vaginal itching without odor) which have not been improved by OTC monistat. She has had some improvement with Diflucan.  Allergies: 1)  ! Ace Inhibitors  Physical Exam  General:  Well-developed,well-nourished,in no acute distress; alert,appropriate and cooperative  throughout examination Psych:  Oriented X3, memory intact for recent and remote, and normally interactive.     Impression & Recommendations:  Problem # 1:  ANXIETY DEPRESSION (ICD-300.4) Assessment Unchanged Pt felt better on Celexa.  Will transition off of Cymbalta and resume celexa as noted in pt instructions. 15 minutes were spent with pt.  Greater than 50% of this time was spent counseling patient on her anxiety and depression.  Problem # 2:  VAGINITIS (ICD-616.10) Assessment: Deteriorated Will add Diflucan once weekly as needed for recurrent candidiasis while on minocycline. Her updated medication list for this problem includes:    Solodyn 105 Mg Xr24h-tab (Minocycline hcl) ..... One tablet by mouth daily  Complete Medication List: 1)  Hydrochlorothiazide 25 Mg Tabs (Hydrochlorothiazide) .... Take 1 tablet by mouth once a day 2)  Daily Multi Tabs (Multiple vitamins-minerals) .... Take 1 tablet by mouth once a day 3)  Ortho Tri-cyclen (28) 0.18/0.215/0.25 Mg-35 Mcg Tabs (Norgestim-eth estrad triphasic) .... Take as direct 4)  Benzoyl Peroxide-erythromycin 5-3 % Gel (Benzoyl peroxide-erythromycin) .... Wash and dry skin, then apply gel twice daily 5)  Fluconazole 150 Mg Tabs (Fluconazole) .... One by mouth once weekly as needed for vaginal itching/discharge 6)  Celexa 20 Mg Tabs (Citalopram hydrobromide) .... One tablet by mouth daily 7)  Klonopin 0.5 Mg Tabs (Clonazepam) .Marland KitchenMarland KitchenMarland Kitchen  One tablet by mouth three times daily as needed for anxiety 8)  Solodyn 105 Mg Xr24h-tab (Minocycline hcl) .... One tablet by mouth daily  Patient Instructions: 1)  Drop your cymbalta down to 30mg  once daily for one week, then stop.  Begin celexa 20mg  by mouth daily after you complete the cymbalta. 2)  Follow up in 1 month.  Call sooner if you develop worsening symptoms of depression or anxiety. Prescriptions: CELEXA 20 MG TABS (CITALOPRAM HYDROBROMIDE) one tablet by mouth daily  #30 x 1   Entered and  Authorized by:   Lemont Fillers FNP   Signed by:   Lemont Fillers FNP on 08/08/2009   Method used:   Electronically to        [facility] [phone]* (retail)       [address]       [place], [place]  [address]       Ph: [phone]       Fax: [phone]   RxID:   [id number]    Current Allergies (reviewed today): ! ACE INHIBITORS

## 2010-02-19 NOTE — Letter (Signed)
Summary: Controlled Substances Contract  [place] at [facility]  [address]   [place], [place] [address]   Phone: [phone]  Fax: [phone]    [facility] Controlled Substances Contract         Patient Name: Nicole Quinn Patient DOB: 25-Mar-1975        Patient MRN:  [id number]        Physician's Name: _________________________________________   Patients must complete this contract before doctors at the [facility] office will be willing to prescribe controlled substances. I understand that: ___1)  I am responsible for my controlled substance medications.  If my prescription is lost, misplaced or stolen, or if I take more than prescribed, my doctor will not write me a new prescription. ___2)  I will not request or accept controlled substances or controlled substance prescriptions from any other doctor or clinic while I am receiving controlled substance treatment at [facility].  The ONLY exception is if controlled substances are prescribed for the treatment of an acute condition that is NOT the diagnosis for which I am receiving treatment at [facility].   I will call my physician at [facility] if I receive controlled substance or controlled substance prescriptions from anywhere else. ___3)  Controlled substance refills will be made ONLY during regular office hours. ___4)  Refills will not be made if I run out early.  Refills will not be made during work-in or urgent care visits.  Refills will NOT be made for "emergencies", such as on a Friday afternoon or by on call service at night or weekends.  I understand that I am required to call at least 2 business days prior to expiration date for controlled substance and/or needing controlled substance refills.   ___5)  I will not use illicit (illegal) drugs.  ___6)  I agree to take urine or blood drug tests when requested for routine screening. ___7)  I agree to use only ONE pharmacy  for filling ALL my controlled substance prescriptions.             Name and Location of Pharmacy:                                                                                                                  ___8)  I understand that my doctor may review my use of controlled substances using the [facility] Controlled Substance Reporting System. ___9)  If I behave in an abusive way towards [facility] staff, my controlled substance prescriptions may be stopped, and I may be dismissed from this practice. ___10)  I understand that if I break any of the above terms of this contract, my pain prescription and/or treatment may be stopped immediately.  If I get controlled substances from someone else or use illegal drugs, I may be reported to all my doctors, medical facilities and appropriate authorities.  I have been fully informed by [facility] physicians and the staff regarding psychological dependence (  addiction) to controlled substances.  I understand that I should stop my medication ONLY under medical supervision or I may have withdrawal symptoms.  ***I have read this contract and it has been explained to me by [facility] physicians and/or their staff, and I fully understand the consequences of violating any of the terms of this contract.    Patient Signature _________________________________________ Date July 09, 2009   [facility] Staff Signature ____________________________________ Date July 09, 2009

## 2010-02-19 NOTE — Assessment & Plan Note (Signed)
Summary: gyn exam / tf,cma--Rm 4   Vital Signs:  Patient profile:   35 year old female Height:      65 inches Weight:      192 pounds BMI:     32.07 Temp:     98.2 degrees F oral Pulse rate:   78 / minute Pulse rhythm:   regular Resp:     16 per minute BP sitting:   122 / 82  (right arm) Cuff size:   regular  Vitals Entered By: Mervin Kung CMA Duncan Dull) (October 19, 2009 9:10 AM) CC: Rm 4  Pt having itching and clear discharge intermittent for some time, now has odor. Is Patient Diabetic? No Pain Assessment Patient in pain? no      Comments Pt no longer takes ortho tri cyclen, benzoyl peroxide, klonopin or solodyn. States Dr Joseph Art stopped meds. Nicki Guadalajara Fergerson CMA Duncan Dull)  October 19, 2009 9:17 AM   Primary Care Provider:  Lemont Fillers FNP  CC:  Rm 4  Pt having itching and clear discharge intermittent for some time and now has odor.Marland Kitchen  History of Present Illness: [name] is a 35 year old female who presents today with complaint of vaginal odor for several days.  Fishy/strong odor-  more noticeable after intercourse.  Itching and clear discharge x 3 days.  She is off of the minocycline and OCP which was being prescribed by her Dermatologist for her acne.  Patient tried diflucan on 9/23 without improvement.    Allergies: 1)  ! Ace Inhibitors  Physical Exam  General:  Well-developed,well-nourished,in no acute distress; alert,appropriate and cooperative throughout examination Genitalia:  normal introitus and no external lesions.  Cervix is surgically absent.  + white thick discharge noted on vaginal walls.    Impression & Recommendations:  Problem # 1:  VAGINITIS (ICD-616.10) Assessment Deteriorated Wet prep performed today and sent to the lab for evaluation.  Will plan to treat based on results.   The following medications were removed from the medication list:    Solodyn 105 Mg Xr24h-tab (Minocycline hcl) ..... One tablet by mouth daily  Metronidazole 500 Mg Tabs (Metronidazole) ..... One tablet by mouth two times a day x 7 days- do not drink alcohol while on this medication  Orders: Specimen Handling ([address]) T-Wet Prep by [facility] [phone])  Complete Medication List: 1)  Hydrochlorothiazide 25 Mg Tabs (Hydrochlorothiazide) .... Take 1 tablet by mouth once a day 2)  Daily Multi Tabs (Multiple vitamins-minerals) .... Take 1 tablet by mouth once a day 3)  Citalopram Hydrobromide 40 Mg Tabs (Citalopram hydrobromide) .... One tablet by mouth daily  Patient Instructions: 1)  We will contact you about your lab results.  Current Allergies (reviewed today): ! ACE INHIBITORS  Appended Document: gyn exam / tf,cma--Rm 4 Exam  Psych: A and O x 3. Calm, pleasant affect.

## 2010-02-19 NOTE — Letter (Signed)
   [place] at [facility] [address] [place], [place]  [address]  [place] Phone: [phone]      April 10, 2009   Avital TOWNSEND [address] [place], [place] [address]  RE:  LAB RESULTS  Dear  Ms. Viviann Spare,  The following is an interpretation of your most recent lab tests.  Please take note of any instructions provided or changes to medications that have resulted from your lab work.  ELECTROLYTES:  Good - no changes needed  KIDNEY FUNCTION TESTS:  Good - no changes needed  THYROID STUDIES:  Thyroid studies normal TSH: 0.509     DIABETIC STUDIES:  Good - no changes needed Blood Glucose: 103    CBC:  Good - no changes needed   Sincerely Yours,    Lemont Fillers FNP

## 2010-02-19 NOTE — Letter (Signed)
   [place] at [facility] [address] [place], [place]  [address]  [place] Phone: [phone]      May 16, 2009   Crissy TOWNSEND [address] [place], [place] [address]  RE:  LAB RESULTS  Dear  Ms. Viviann Spare,  The following is an interpretation of your most recent lab tests.  Please take note of any instructions provided or changes to medications that have resulted from your lab work.  The testing we performed was negative for Gonorrhea and Chlamydia.  The swab we sent to the lab was consistent with bacterial infection.  Please call if your symptoms are not improved after completing the metronidazole.    Sincerely Yours,    Lemont Fillers FNP

## 2010-02-19 NOTE — Assessment & Plan Note (Signed)
Summary: Nicole Quinn, new to est- jr   Vital Signs:  Patient profile:   35 year old female Height:      65 inches Weight:      203.50 pounds BMI:     33.99 O2 Sat:      98 % on Room air Temp:     98.4 degrees F oral Pulse rate:   92 / minute Pulse rhythm:   regular Resp:     16 per minute BP sitting:   130 / 88  (right arm) Cuff size:   regular  Vitals Entered By: Mervin Kung CMA (April 10, 2009 1:49 PM)  O2 Flow:  Room air Is Patient Diabetic? No   History of Present Illness: [name] is a 35 year old female who presents today to establish care.  Was being seen at [facility] for primary care previously.    Patient notes that she has developed swelling and shortness of breath which started on saturday. Notes  that she had a rash on her forearms last week but this resolved on its own.    Notes + 40 pound weight gain since january.  Notes bilateral lower extremity pain and tightness in her legs, notes that she has been wearing flip flops since she developed swelling.    Preventative- goes to gym, does elliptical, treadmill, nautilus.  Not sure when last tetanus shot was- thinks [facility] has this info. Notes healthy diet.    Preventive Screening-Counseling & Management  Alcohol-Tobacco     Alcohol drinks/day: 0     Smoking Status: never  Caffeine-Diet-Exercise     Caffeine use/day: 1-2 cans daily     Does Patient Exercise: yes     Type of exercise: treadmill, elliptical     Exercise (avg: min/session): 30-60     Times/week: 3  Allergies (verified): 1)  ! 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 Resp: Denies cough CV:  mild chest aching, not worse with exertion,  + SOB worse with exertion GI:  Denies nausea or vomitting GU: Denies dysuria Lymphatic: Denies lymphadenopathy Musculoskeletal:  Denies muscle/joint pain Skin:  Denies Rashes Psychiatric: Denies depression or anxiety Neuro: Denies numbness   Physical Exam  General:  Well-developed,well-nourished,in no acute distress; alert,appropriate and cooperative throughout examination Head:  Normocephalic and atraumatic without obvious abnormalities. No apparent alopecia or balding. Eyes:  PERRLA Ears:  External ear exam shows no significant lesions or deformities.  Otoscopic examination reveals clear canals, tympanic membranes are intact bilaterally without bulging, retraction, inflammation or discharge. Hearing is grossly normal bilaterally. Mouth:  Oral mucosa and oropharynx without lesions or exudates.  Teeth in good repair. Neck:  No deformities, masses, or tenderness noted. Breasts:  No mass, nodules, thickening,  tenderness, bulging, retraction, inflamation, nipple discharge or skin changes noted.   Lungs:  Normal respiratory effort, chest expands symmetrically. Lungs are clear to auscultation, no crackles or wheezes. Heart:  Normal rate and regular rhythm. S1 and S2 normal without gallop, murmur, click, rub or other extra sounds. Abdomen:  Bowel sounds positive,abdomen  soft and non-tender without masses, organomegaly or hernias noted. Genitalia:  uterus is surgically absent, + thick white discharge which is adherent to vaginal walls, no adnexal fullness on bimanual exam. Msk:  No deformity or scoliosis noted of thoracic or lumbar spine.   Extremities:  2+LLE swelling, 1-2+ RLE swelling Neurologic:  alert & oriented X3 and gait normal.   Skin:  Intact without suspicious lesions or rashes Cervical Nodes:  No lymphadenopathy noted Psych:  Cognition and judgment appear intact. Alert and cooperative with normal attention span and concentration. No apparent delusions, illusions, hallucinations   Impression & Recommendations:  Problem # 1:  Preventive Health Care (ICD-V70.0) Assessment Comment Only Patient counselled on diet, exercise, immunizations reviewed- may need tetanus, will try to obtain records from [facility] medical.   Orders: T-Comprehensive Metabolic Panel [id number]) T-CBC w/Diff ([id number]) T-TSH [phone])  Problem # 2:  INCREASED BLOOD PRESSURE (ICD-796.2) Notes + compliance with meds and low sodium diet. BP control is reasonable.   Her updated medication list for this problem includes:    Hydrochlorothiazide 25 Mg Tabs (Hydrochlorothiazide) .Marland Kitchen... Take 1 tablet by mouth once a day  BP today: 130/88  Instructed in low sodium diet (DASH Handout) and behavior modification.    Problem # 3:  EDEMA (ICD-782.3) Assessment: New I am concerned about patient's SOB and asymmetric lower extremity edema that she could have a PE, will send for CTA chest to r/o PE, as well as bilateral LE doppler to rule out DVT.   (EKG is NSR without any signs of ischemia.) Diastolic CHF is another possibility in setting of HTN and ? of left atrial enlargement on EKG.  Plan to refer for 2-D echo as well.   Her updated medication list for this problem includes:    Hydrochlorothiazide 25 Mg Tabs (Hydrochlorothiazide) .Marland Kitchen... Take 1 tablet by mouth once a  day  Orders: T-BNP  (B Natriuretic Peptide) ([id number]) Misc. Referral (Misc. Ref) Echo Referral (Echo)  Problem # 4:  VAGINITIS, CANDIDAL (ICD-112.1) Assessment: New  Her updated medication list for this problem includes:    Fluconazole 150 Mg Tabs (Fluconazole) ..... One tablet by mouth x 1, may repeat in 3 days as needed  Orders: T-Wet Prep by [facility] [id number]) Specimen Handling ([id number])  Complete Medication List: 1)  Hydrochlorothiazide 25 Mg Tabs (Hydrochlorothiazide) .... Take 1 tablet by mouth once a day 2)  Daily Multi Tabs (Multiple vitamins-minerals) .... Take 1 tablet by mouth once a day 3)  Nouritress (hair Vitamin)  .... Take 1 tablet by mouth once a day 4)  Ortho Tri-cyclen (28) 0.18/0.215/0.25 Mg-35 Mcg Tabs (Norgestim-eth estrad triphasic) .... Take as direct 5)  Fluconazole 150 Mg Tabs (Fluconazole) .... One tablet by mouth x 1, may repeat in 3 days as needed  Patient Instructions: 1)  Please follow up in 2 weeks. 2)  Complete CT scan today- I will call you with the results. 3)  Complete lab work today. 4)  Go to ER if you develop worsening shortness of breath or chest pain.   Prescriptions: FLUCONAZOLE 150 MG TABS (FLUCONAZOLE) one tablet by mouth x 1, may repeat in 3 days as needed  #1 x 1  Entered and Authorized by:   Lemont Fillers FNP   Signed by:   Lemont Fillers FNP on 04/10/2009   Method used:   Print then Give to Patient   RxID:   [id number]   Current Allergies (reviewed today): ! ACE INHIBITORS       Vital Signs:  Patient Profile:   35 year old female Height:     65 inches Weight:      203.50 pounds BMI:     33.99 O2 Sat:      98 % Temp:     98.4 degrees F oral Pulse rate:   92 / minute Pulse rhythm:   regular Resp:     16 per minute BP sitting:   130 / 88 Cuff size:   regular

## 2010-02-19 NOTE — Progress Notes (Signed)
Summary: please call patient   Phone Note Call from Patient   Caller: Patient Call For: Jeromey Kruer Summary of Call: patient would like return call [phone] Initial call taken by: Roselle Locus,  April 11, 2009 8:24 AM  Follow-up for Phone Call        I spoke to pt. and notified her of test results and need to start Metrogel per Ayerim Berquist. Follow-up by: Mervin Kung CMA,  April 11, 2009 11:57 AM

## 2010-02-19 NOTE — Progress Notes (Signed)
Summary: med change--lm  Phone Note Call from Patient Call back at [facility] Phone [phone]   Caller: Patient Summary of Call: Instead of the gel you called in for this patient, it would cheaper for patient to have the pills. Call with any questions Initial call taken by: Michaelle Copas,  April 11, 2009 9:36 AM  Follow-up for Phone Call        Please call pt and advise her that I have sent in pills to her pharmacy instead of gel.  Do not drink ETOH while on this medicine.  Metrogel rx was cancelled per Trula Ore at [facility].  Left message on machine to return call. Mervin Kung CMA  April 11, 2009 3:50 PM  Follow-up by: Lemont Fillers FNP,  April 11, 2009 1:24 PM  Additional Follow-up for Phone Call Additional follow up Details #1::        Pt. notified of rx change and advised to avoid alcohol while on this medication. Nicki Guadalajara Fergerson CMA  April 12, 2009 9:12 AM     New/Updated Medications: METRONIDAZOLE 500 MG TABS (METRONIDAZOLE) one tab by mouth two times a day x 7 days Prescriptions: METRONIDAZOLE 500 MG TABS (METRONIDAZOLE) one tab by mouth two times a day x 7 days  #14 x 0   Entered and Authorized by:   Lemont Fillers FNP   Signed by:   Lemont Fillers FNP on 04/11/2009   Method used:   Electronically to        [facility] [phone]* (retail)       [address]       [place], [place]  [address]       Ph: [phone]       Fax: [phone]   RxID:   [id number]

## 2010-02-19 NOTE — Progress Notes (Signed)
Summary: wet prep result  Phone Note Outgoing Call   Summary of Call: Please call patient and let her know that her wet prep notes + bacteria.  Will treat with metronidazole.  Rx has been sent to pharmacy. Initial call taken by: Lemont Fillers FNP,  October 22, 2009 8:24 AM  Follow-up for Phone Call        Pt notified. Nicki Guadalajara Fergerson CMA (AAMA)  October 22, 2009 12:10 PM     New/Updated Medications: METRONIDAZOLE 500 MG TABS (METRONIDAZOLE) one tablet by mouth two times a day x 7 days Prescriptions: METRONIDAZOLE 500 MG TABS (METRONIDAZOLE) one tablet by mouth two times a day x 7 days  #14 x 0   Entered and Authorized by:   Lemont Fillers FNP   Signed by:   Lemont Fillers FNP on 10/22/2009   Method used:   Electronically to        [facility] [phone]* (retail)       [address]       [place], [place]  [address]       Ph: [phone]       Fax: [phone]   RxID:   [id number]

## 2010-02-19 NOTE — Medication Information (Signed)
Summary: Approval for Cymbalta/[employer]  Approval for Cymbalta/[employer]   Imported By: Lanelle Bal 08/06/2009 08:36:20  _____________________________________________________________________  External Attachment:    Type:   Image     Comment:   External Document

## 2010-02-19 NOTE — Progress Notes (Signed)
  Phone Note Outgoing Call   Summary of Call: Called CT at [facility]- they told me they were still waiting on labs.  I reviewed renal function with sharon at CT, she will call patient and request that she come in tonight to complete CT.  She did tell me that she called patient at 9:30PM and she did not answer- but she will try again. Initial call taken by: Lemont Fillers FNP,  April 10, 2009 9:52 PM

## 2010-02-19 NOTE — Letter (Signed)
   [place] [facility] [address] [place], [place]  [address]  [place] Phone: [phone]      Jun 05, 2009   Parminder TOWNSEND [address] [place], [place] [address]  RE:  LAB RESULTS  Dear  Ms. Viviann Spare,  The following is an interpretation of your most recent lab tests.  Please take note of any instructions provided or changes to medications that have resulted from your lab work.  THYROID STUDIES:  Thyroid studies normal TSH: 0.497     Testosterone level is normal.  Thyroid studies are normal.  Your diabetic testing shows that your sugars are at the upper limit of normal.  Continue to work hard on healthy eating, diet and exercise.  Avoid concentrated sweets (i.e. soda, candy), substitute whole grained pasta and bread for white bread/pasta.  Try to eat smaller portions of carbohydrates and more vegetables.  We will discuss in further detail at your appointment in June.     Sincerely Yours,    Lemont Fillers FNP

## 2010-02-19 NOTE — Progress Notes (Signed)
Summary: CT / wet prep results  Phone Note Outgoing Call   Summary of Call: Please call ms townsend and let her know CTA chest negative for PE, also her wet prep looks like she has a bacterial infection.  I would like her to start metrogel. Initial call taken by: Lemont Fillers FNP,  April 11, 2009 7:21 AM  Follow-up for Phone Call        Pt advised CT negative for PE and wet prep shows bacterial infection.  Pt. made aware that rx. has been sent to pharmacy for this and instructions given. Follow-up by: Mervin Kung CMA,  April 11, 2009 8:31 AM    New/Updated Medications: METROGEL-VAGINAL 0.75 % GEL (METRONIDAZOLE) one applicator full in vagina at bedtime for 5 nights Prescriptions: METROGEL-VAGINAL 0.75 % GEL (METRONIDAZOLE) one applicator full in vagina at bedtime for 5 nights  #1 tube x 0   Entered and Authorized by:   Lemont Fillers FNP   Signed by:   Lemont Fillers FNP on 04/11/2009   Method used:   Electronically to        [facility] [phone]* (retail)       [address]       [place], [place]  [address]       Ph: [phone]       Fax: [phone]   RxID:   [id number]

## 2010-02-19 NOTE — Progress Notes (Signed)
Summary: Lab Results  Phone Note Outgoing Call   Summary of Call: Please call patient and let her know that it looks like she has a bacterial infection.  I have sent metrogel to her pharmacy.  thanks Initial call taken by: Lemont Fillers FNP,  September 06, 2009 12:57 PM  Follow-up for Phone Call        patient has been informed per Sandford Craze instructions, patient is requesting the pills instead of the cream. She states the cost is less for the pills  Follow-up by: Glendell Docker CMA,  September 07, 2009 12:55 PM  Additional Follow-up for Phone Call Additional follow up Details #1::        rx was sent. Additional Follow-up by: Lemont Fillers FNP,  September 07, 2009 1:45 PM    Additional Follow-up for Phone Call Additional follow up Details #2::    patient aware of rx change Follow-up by: Glendell Docker CMA,  September 10, 2009 8:01 AM  New/Updated Medications: METROGEL-VAGINAL 0.75 % GEL (METRONIDAZOLE) one applicator full in vagina at bedtime x 5 days METRONIDAZOLE 500 MG TABS (METRONIDAZOLE) one tablet by mouth two times a day x 7 days- do not drink alcohol while on this medication Prescriptions: METRONIDAZOLE 500 MG TABS (METRONIDAZOLE) one tablet by mouth two times a day x 7 days- do not drink alcohol while on this medication  #14 x 0   Entered and Authorized by:   Lemont Fillers FNP   Signed by:   Lemont Fillers FNP on 09/07/2009   Method used:   Electronically to        [facility] [phone]* (retail)       [address]       [place], [place]  [address]       Ph: [phone]       Fax: [phone]   RxID:   [id number] METROGEL-VAGINAL 0.75 % GEL (METRONIDAZOLE) one applicator full in vagina at bedtime x 5 days  #1 x 0   Entered and Authorized by:   Lemont Fillers FNP   Signed by:   Lemont Fillers FNP on 09/06/2009   Method used:   Electronically to        [facility] [phone]* (retail)       [address]       [place], [place]  [address]       Ph: [phone]       Fax: [phone]   RxID:   [id number]

## 2010-02-21 NOTE — Progress Notes (Signed)
Summary: refill --hctz  Phone Note Refill Request Message from:  Fax from Pharmacy on February 11, 2010 11:23 AM  Refills Requested: Medication #1:  HYDROCHLOROTHIAZIDE 25 MG TABS Take 1 tablet by mouth once a day   Dosage confirmed as above?Dosage Confirmed   Brand Name Necessary? No   Supply Requested: 1 month   Last Refilled: 01/14/2010  Method Requested: Electronic Next Appointment Scheduled: none Initial call taken by: Roselle Locus,  February 11, 2010 11:24 AM    Prescriptions: HYDROCHLOROTHIAZIDE 25 MG TABS (HYDROCHLOROTHIAZIDE) Take 1 tablet by mouth once a day  #30 Each x 1   Entered by:   Mervin Kung CMA (AAMA)   Authorized by:   Lemont Fillers FNP   Signed by:   Mervin Kung CMA (AAMA) on 02/11/2010   Method used:   Faxed to ...       [facility] [phone] (retail)       [address]       [place], [place]  [address]       Ph: [phone]       Fax: [phone]   RxID:   [id number]

## 2010-02-27 ENCOUNTER — Telehealth: Payer: Self-pay | Admitting: Family

## 2010-03-01 ENCOUNTER — Encounter: Payer: Self-pay | Admitting: Family

## 2010-03-01 ENCOUNTER — Ambulatory Visit (INDEPENDENT_AMBULATORY_CARE_PROVIDER_SITE_OTHER): Payer: Managed Care, Other (non HMO) | Admitting: Family

## 2010-03-01 DIAGNOSIS — R609 Edema, unspecified: Secondary | ICD-10-CM | POA: Insufficient documentation

## 2010-03-01 DIAGNOSIS — N898 Other specified noninflammatory disorders of vagina: Secondary | ICD-10-CM

## 2010-03-01 DIAGNOSIS — N760 Acute vaginitis: Secondary | ICD-10-CM

## 2010-03-01 DIAGNOSIS — I10 Essential (primary) hypertension: Secondary | ICD-10-CM

## 2010-03-02 ENCOUNTER — Encounter: Payer: Self-pay | Admitting: Family

## 2010-03-02 LAB — CONVERTED CEMR LAB
Candida species: NEGATIVE
Gardnerella vaginalis: POSITIVE — AB
Trichomonal Vaginitis: NEGATIVE

## 2010-03-04 ENCOUNTER — Telehealth: Payer: Self-pay | Admitting: Family

## 2010-03-07 NOTE — Assessment & Plan Note (Signed)
Summary: showing signs of bacterial infection?/ss--rm 5   Vital Signs:  Patient profile:   35 year old female Menstrual status:  hysterectomy Height:      65 inches Weight:      206.50 pounds BMI:     34.49 Temp:     98.1 degrees F oral Pulse rate:   84 / minute Pulse rhythm:   regular Resp:     16 per minute BP sitting:   156 / 96  (right arm) Cuff size:   large  Vitals Entered By: Mervin Kung CMA Duncan Dull) (March 01, 2010 8:17 AM) CC: Pt has vaginal discharge and slight itching. Also notes that BP has been elevated recently and having swelling all over. Is Patient Diabetic? No Pain Assessment Patient in pain? no      Comments Pt completed Metronidazole.      Menstrual Status hysterectomy   Primary Care Provider:  Lemont Fillers FNP  CC:  Pt has vaginal discharge and slight itching. Also notes that BP has been elevated recently and having swelling all over.Marland Kitchen  History of Present Illness: [name] is a 35 year old female who presents today with CC of vaginal discharge.  1.  Vaginal Discharge-  off and on for months. White, thin discharge.  +Odor.  Denies associated fever of pelvic pain.    2.  HTN-  Pt is continuing HCTZ.  Notes feeling of swelling in hands, feet and face.  Notes some shortness of breath with exertion- mild.  She attributes this to deconditioning.  Denies associated chest pain.  145/92.  Allergies: 1)  ! Ace Inhibitors  Past History:  Past Medical History: Last updated: 04/10/2009 Hx of anemia Allergies HTN Hx of UTI endometriosis  Past Surgical History: Last updated: 04/10/2009 partial hysterectomy--2003  Review of Systems       see HPI  Physical Exam  General:  Well-developed,well-nourished,in no acute distress; alert,appropriate and cooperative throughout examination Lungs:  Normal respiratory effort, chest expands symmetrically. Lungs are clear to auscultation, no crackles or wheezes. Heart:  Normal rate and  regular rhythm. S1 and S2 normal without gallop, murmur, click, rub or other extra sounds. Genitalia:  + thick white vaginal discharge noted on exam Extremities:  trace lower extremity edema.  No significant facial edema  is noted.   Impression & Recommendations:  Problem # 1:  VAGINITIS (ICD-616.10) Assessment New Wet prep was performed today.  Will await results and plan treatment accordingly.  Pt was instructed to avoid synthetic fabric underwear, and thongs and only wear cotton briefs.    Problem # 2:  EDEMA (ICD-782.3) Assessment: New Will refer for 2-D echo to further evaluate. Her updated medication list for this problem includes:    Hydrochlorothiazide 25 Mg Tabs (Hydrochlorothiazide) .Marland Kitchen... Take 1 tablet by mouth once a day  Orders: Misc. Referral (Misc. Ref)  Problem # 3:  HYPERTENSION (ICD-401.9) Assessment: Deteriorated F/u BP was improved 145/92. A  Pt to check bp at home and call us with the results.  If elevated next visit, will need to add antihypertensive medication. Her updated medication list for this problem includes:    Hydrochlorothiazide 25 Mg Tabs (Hydrochlorothiazide) .Marland Kitchen... Take 1 tablet by mouth once a day  Orders: Misc. Referral (Misc. Ref)  BP today: 156/96 Prior BP: 122/82 (10/19/2009)  Labs Reviewed: K+: 3.8 (04/10/2009) Creat: : 0.76 (04/10/2009)     Complete Medication List: 1)  Hydrochlorothiazide 25 Mg Tabs (Hydrochlorothiazide) .... Take 1 tablet by mouth once a day  2)  Daily Multi Tabs (Multiple vitamins-minerals) .... Take 1 tablet by mouth once a day 3)  Citalopram Hydrobromide 40 Mg Tabs (Citalopram hydrobromide) .... One tablet by mouth daily  Other Orders: Specimen Handling ([address]) T-Wet Prep by [facility] [phone])  Patient Instructions: 1)  Please check your blood pressure once daily for then next week and call us with the results.  Call if 160/90. 2)  You will be contacted about your echocardiogram and the results of  your wet prep. 3)  Follow up in 1 month.   Orders Added: 1)  Specimen Handling [[address]] 2)  T-Wet Prep by [facility] [[id number]] 3)  Misc. Referral [Misc. Ref] 4)  Est. Patient Level III [[address]]     Current Allergies (reviewed today): ! ACE INHIBITORS

## 2010-03-07 NOTE — Progress Notes (Signed)
Summary: med request  Phone Note Call from Patient   Caller: Patient Call For: Lemont Fillers FNP Summary of Call: Received message from pt that she has appt on Friday for wet prep but is wanting to know if we will prescribe something for her until she can be seen. Advised pt that we cannot prescribe anything until Efraim Kaufmann knows what she is treating. Pt voices understanding. Initial call taken by: Mervin Kung CMA Duncan Dull),  February 27, 2010 4:09 PM

## 2010-03-08 ENCOUNTER — Ambulatory Visit (HOSPITAL_COMMUNITY): Payer: Managed Care, Other (non HMO) | Attending: Family

## 2010-03-08 DIAGNOSIS — R609 Edema, unspecified: Secondary | ICD-10-CM | POA: Insufficient documentation

## 2010-03-08 DIAGNOSIS — R0609 Other forms of dyspnea: Secondary | ICD-10-CM | POA: Insufficient documentation

## 2010-03-08 DIAGNOSIS — R0989 Other specified symptoms and signs involving the circulatory and respiratory systems: Secondary | ICD-10-CM

## 2010-03-11 ENCOUNTER — Telehealth: Payer: Self-pay | Admitting: Family

## 2010-03-12 ENCOUNTER — Telehealth: Payer: Self-pay | Admitting: Family

## 2010-03-13 ENCOUNTER — Ambulatory Visit (INDEPENDENT_AMBULATORY_CARE_PROVIDER_SITE_OTHER): Payer: Managed Care, Other (non HMO) | Admitting: Family

## 2010-03-13 ENCOUNTER — Encounter: Payer: Self-pay | Admitting: Family

## 2010-03-13 DIAGNOSIS — B351 Tinea unguium: Secondary | ICD-10-CM | POA: Insufficient documentation

## 2010-03-13 DIAGNOSIS — I10 Essential (primary) hypertension: Secondary | ICD-10-CM

## 2010-03-13 LAB — CONVERTED CEMR LAB
ALT: 14 units/L (ref 0–35)
AST: 21 units/L (ref 0–37)
Albumin: 4.4 g/dL (ref 3.5–5.2)
Alkaline Phosphatase: 86 units/L (ref 39–117)
BUN: 15 mg/dL (ref 6–23)
Bilirubin, Direct: 0.1 mg/dL (ref 0.0–0.3)
CO2: 25 meq/L (ref 19–32)
Calcium: 9.3 mg/dL (ref 8.4–10.5)
Chloride: 104 meq/L (ref 96–112)
Creatinine, Ser: 0.79 mg/dL (ref 0.40–1.20)
Glucose, Bld: 74 mg/dL (ref 70–99)
Indirect Bilirubin: 0.3 mg/dL (ref 0.0–0.9)
Potassium: 3.8 meq/L (ref 3.5–5.3)
Sodium: 140 meq/L (ref 135–145)
Total Bilirubin: 0.4 mg/dL (ref 0.3–1.2)
Total Protein: 7.3 g/dL (ref 6.0–8.3)

## 2010-03-13 NOTE — Progress Notes (Signed)
  Phone Note Outgoing Call   Summary of Call: Spoke to patient.  She told me that her DBP did not go below 100 over the weekend.  Will add lopressor  Instructed patient to continue to monitor her pressure and let us know if her BP does not improve.  Reminded her to f/u in 1 month.  Also discussed wet prep results- will send metrogel to her pharmacy. Initial call taken by: Lemont Fillers FNP,  March 04, 2010 8:35 AM    New/Updated Medications: LOPRESSOR 50 MG TABS (METOPROLOL TARTRATE) one tablet by mouth two times a day METROGEL-VAGINAL 0.75 % GEL (METRONIDAZOLE) one applicator full in vagina at bedtime for 5 nights Prescriptions: METROGEL-VAGINAL 0.75 % GEL (METRONIDAZOLE) one applicator full in vagina at bedtime for 5 nights  #1 x 0   Entered and Authorized by:   Lemont Fillers FNP   Signed by:   Lemont Fillers FNP on 03/04/2010   Method used:   Electronically to        [facility] [phone]* (retail)       [address]       [place], [place]  [address]       Ph: [phone]       Fax: [phone]   RxID:   [id number] LOPRESSOR 50 MG TABS (METOPROLOL TARTRATE) one tablet by mouth two times a day  #60 x 1   Entered and Authorized by:   Lemont Fillers FNP   Signed by:   Lemont Fillers FNP on 03/04/2010   Method used:   Electronically to        [facility] [phone]* (retail)       [address]       [place], [place]  [address]       Ph: [phone]       Fax: [phone]   RxID:   [id number]

## 2010-03-13 NOTE — Progress Notes (Signed)
Summary: losartan concern  Phone Note Call from Patient Call back at Work Phone [phone]   Caller: Patient Call For: Lemont Fillers FNP Summary of Call: Received voice message from pt wanting to know if Losartan could possibly cause the same reaction that she has to ace inhibitors (lip swelling). She read that could be one of the side effects of Losartan and is concerned about taking it. Please advise. Initial call taken by: Mervin Kung CMA Duncan Dull),  March 04, 2010 11:44 AM  Follow-up for Phone Call        Spoke with pt.  Informed her that rx for Lopressor has been sent to pharmacy which is a different class of medications. Follow-up by: Lemont Fillers FNP,  March 04, 2010 12:27 PM

## 2010-03-19 NOTE — Assessment & Plan Note (Signed)
Summary: Possible nail fungus--rm 5   Vital Signs:  Patient profile:   35 year old female Menstrual status:  hysterectomy Height:      65 inches Weight:      204.25 pounds BMI:     34.11 Temp:     98.0 degrees F oral Pulse rate:   72 / minute Pulse rhythm:   regular Resp:     16 per minute BP sitting:   122 / 88  (right arm) Cuff size:   large  Vitals Entered By: Mervin Kung CMA Duncan Dull) (March 13, 2010 8:12 AM) CC: Pt states she lost the second toenail on each foot.  Is Patient Diabetic? No Pain Assessment Patient in pain? no      Comments Pt has completed Metrogel. Nicki Guadalajara Fergerson CMA (AAMA)  March 13, 2010 8:16 AM    Primary Care Provider:  Lemont Fillers FNP  CC:  Pt states she lost the second toenail on each foot. Marland Kitchen  History of Present Illness: Ms.  Viviann Spare is a 35 year old female who presents today due to loss of toenail.  She reports that the nail on her right second toes fell completely off two days ago.  The left second toe is also partially off.  Denies pain.  Noted some peeling, irregularity of the right 2nd toenail prior to falling off.  Admits to Agricultural engineer.    HTN- reports that she feels a little tired since starting the beta blocker.  Otherwise feeling well.    Vaginintis-  symptoms resolved.  Completed metrogel.  Allergies: 1)  ! Ace Inhibitors  Past History:  Past Medical History: Last updated: 04/10/2009 Hx of anemia Allergies HTN Hx of UTI endometriosis  Past Surgical History: Last updated: 04/10/2009 partial hysterectomy--2003  Review of Systems       see hpi  Physical Exam  General:  Well-developed,well-nourished,in no acute distress; alert,appropriate and cooperative throughout examination Extremities:  No peripheral edema is noted.  Right second toenail is absent.  Left second toenail is peeling and irregular.  Other toenails are noted to be slightly discolored.   Psych:  Cognition and judgment appear  intact. Alert and cooperative with normal attention span and concentration. No apparent delusions, illusions, hallucinations   Impression & Recommendations:  Problem # 1:  ONYCHOMYCOSIS, BILATERAL (ICD-110.1) Assessment New Will start terbinafine 250mg  by mouth daily.  Will plan to check baseline creatinine and lfts.  Pt to f/u in 6 weeks.  Will likely require 12 week treatment.  Her updated medication list for this problem includes:    Terbinafine Hcl 250 Mg Tabs (Terbinafine hcl) ..... One tablet by mouth daily  Problem # 2:  HYPERTENSION (ICD-401.9) Assessment: Improved BP is improved today since lopressor was added to her regimen.  She will let me know if the fatigue does not improve, or if it becomes too bothersome for her.   Her updated medication list for this problem includes:    Hydrochlorothiazide 25 Mg Tabs (Hydrochlorothiazide) .Marland Kitchen... Take 1 tablet by mouth once a day    Lopressor 50 Mg Tabs (Metoprolol tartrate) ..... One tablet by mouth two times a day  Orders: TLB-BMP (Basic Metabolic Panel-BMET) (80048-METABOL)  BP today: 122/88 Prior BP: 156/96 (03/01/2010)  Labs Reviewed: K+: 3.8 (04/10/2009) Creat: : 0.76 (04/10/2009)     Complete Medication List: 1)  Hydrochlorothiazide 25 Mg Tabs (Hydrochlorothiazide) .... Take 1 tablet by mouth once a day 2)  Daily Multi Tabs (Multiple vitamins-minerals) .... Take 1 tablet by  mouth once a day 3)  Citalopram Hydrobromide 40 Mg Tabs (Citalopram hydrobromide) .... One tablet by mouth daily 4)  Lopressor 50 Mg Tabs (Metoprolol tartrate) .... One tablet by mouth two times a day 5)  Metrogel-vaginal 0.75 % Gel (Metronidazole) .... One applicator full in vagina at bedtime for 5 nights 6)  Terbinafine Hcl 250 Mg Tabs (Terbinafine hcl) .... One tablet by mouth daily  Other Orders: TLB-Hepatic/Liver Function Pnl (80076-HEPATIC)  Patient Instructions: 1)  Please complete your lab work on the first floor. 2)  Follow up in 6  weeks. Prescriptions: TERBINAFINE HCL 250 MG TABS (TERBINAFINE HCL) one tablet by mouth daily  #30 x 1   Entered and Authorized by:   Lemont Fillers FNP   Signed by:   Lemont Fillers FNP on 03/13/2010   Method used:   Electronically to        [facility] [phone]* (retail)       [address]       [place], [place]  [address]       Ph: [phone]       Fax: [phone]   RxID:   [id number]    Orders Added: 1)  TLB-BMP (Basic Metabolic Panel-BMET) [80048-METABOL] 2)  TLB-Hepatic/Liver Function Pnl [80076-HEPATIC] 3)  Est. Patient Level III [32951]    Current Allergies (reviewed today): ! ACE INHIBITORS

## 2010-03-19 NOTE — Letter (Signed)
   [place] [facility] [address] [place], [place]  [address]  [place] Phone: [phone]      March 13, 2010   Nicole Quinn [address] [place], [place] [address]  RE:  LAB RESULTS  Dear  [name],  The following is an interpretation of your most recent lab tests.  Please take note of any instructions provided or changes to medications that have resulted from your lab work.  ELECTROLYTES:  Good - no changes needed  KIDNEY FUNCTION TESTS:  Good - no changes needed  LIVER FUNCTION TESTS:  Good - no changes needed     Sincerely Yours,    Lemont Fillers FNP  Appended Document:  Mailed.

## 2010-03-19 NOTE — Progress Notes (Signed)
Summary: toenail concern  Phone Note Call from Patient   Caller: Patient Call For: Lemont Fillers FNP Summary of Call: Received message from pt stating 2 toenails fell off over the weekend and she is wanting to know if there is anything she should be worried about?  Left message on voicemail @ [phone] to return my call.  Initial call taken by: Mervin Kung CMA Duncan Dull),  March 11, 2010 10:42 AM  Follow-up for Phone Call        Pt returned my call and states that her toenails looked they had cracked and then they fell off. She lost toenails from the second toe on each foot over the weekend. She denies any changes to her nails. No discoloration noted per pt. Denies redness or swelling of the toes. Wants to know if she should be concerned? Follow-up by: Mervin Kung CMA Duncan Dull),  March 11, 2010 10:53 AM  Additional Follow-up for Phone Call Additional follow up Details #1::        If she has not had any injury to her toenails, it may be due to toenail fungus.  I would recommend that she book apt to be seen and I will evaluate her for possible treatment with an antifungal medication. Additional Follow-up by: Lemont Fillers FNP,  March 11, 2010 10:57 AM    Additional Follow-up for Phone Call Additional follow up Details #2::    Pt notified and scheduled follow up for 03/13/10 @ 8:15am. Mervin Kung CMA (AAMA)  March 11, 2010 11:34 AM

## 2010-03-19 NOTE — Progress Notes (Signed)
Summary: echo results  Phone Note Call from Patient Call back at Work Phone [phone]   Summary of Call: Pt would like return call regarding echocardiogram results. Please advise. Initial call taken by: Mervin Kung CMA Duncan Dull),  March 12, 2010 8:22 AM  Follow-up for Phone Call        called pt. reviewed echo results. Follow-up by: Lemont Fillers FNP,  March 12, 2010 8:55 AM

## 2010-04-01 ENCOUNTER — Telehealth: Payer: Self-pay | Admitting: Family

## 2010-04-09 NOTE — Progress Notes (Signed)
Summary: refill-metrogel  Phone Note Refill Request Message from:  Fax from Pharmacy on April 01, 2010 8:47 AM  Refills Requested: Medication #1:  METROGEL-VAGINAL 0.75 % GEL one applicator full in vagina at bedtime for 5 nights   Dosage confirmed as above?Dosage Confirmed   Brand Name Necessary? No   Supply Requested: 1 month   Last Refilled: 03/04/2010 [facility] [address] [place] [address] fax [phone]   Method Requested: Electronic Next Appointment Scheduled: none Initial call taken by: Elba Barman,  April 01, 2010 8:49 AM    Prescriptions: METROGEL-VAGINAL 0.75 % GEL (METRONIDAZOLE) one applicator full in vagina at bedtime for 5 nights  #1 x 0   Entered and Authorized by:   Lemont Fillers FNP   Signed by:   Lemont Fillers FNP on 04/01/2010   Method used:   Electronically to        [facility] [phone]* (retail)       [address]       [place], [place]  [address]       Ph: [phone]       Fax: [phone]   RxID:   [id number]   Appended Document: refill-metrogel Pt wants to know if there is something she can take on an as needed basis when her symptoms of bacterial infection return or is there anything different she should be doing.   Appended Document: refill-metrogel She can try adding an over the counter probiotic supplement once daily to see if this helps.  She should wear cotton underwear, avoid use of douches, use a gentle soap.    Appended Document: refill-metrogel Pt notified.

## 2010-04-23 ENCOUNTER — Ambulatory Visit: Payer: Commercial Indemnity | Admitting: Family

## 2010-05-06 ENCOUNTER — Encounter: Payer: Self-pay | Admitting: Family

## 2010-05-07 ENCOUNTER — Ambulatory Visit (INDEPENDENT_AMBULATORY_CARE_PROVIDER_SITE_OTHER): Payer: Managed Care, Other (non HMO) | Admitting: Family

## 2010-05-07 ENCOUNTER — Encounter: Payer: Self-pay | Admitting: Family

## 2010-05-07 DIAGNOSIS — B9689 Other specified bacterial agents as the cause of diseases classified elsewhere: Secondary | ICD-10-CM

## 2010-05-07 DIAGNOSIS — A499 Bacterial infection, unspecified: Secondary | ICD-10-CM

## 2010-05-07 DIAGNOSIS — I10 Essential (primary) hypertension: Secondary | ICD-10-CM

## 2010-05-07 DIAGNOSIS — B351 Tinea unguium: Secondary | ICD-10-CM

## 2010-05-07 DIAGNOSIS — N760 Acute vaginitis: Secondary | ICD-10-CM

## 2010-05-07 MED ORDER — METRONIDAZOLE 500 MG PO TABS: 500.0000 mg | ORAL_TABLET | Freq: Three times a day (TID) | ORAL | Status: DC

## 2010-05-07 MED ORDER — AMLODIPINE BESYLATE 5 MG PO TABS: 5.0000 mg | ORAL_TABLET | Freq: Every day | ORAL | Status: DC

## 2010-05-07 NOTE — Patient Instructions (Addendum)
Please follow up in 1 month. Take your blood pressure once daily.  Do not take norvasc if your pressure is less than 110/80.

## 2010-05-07 NOTE — Progress Notes (Signed)
  Subjective:    Patient ID: Nicole Quinn, female    DOB: [birth year], 35 y.o.   MRN: [id number]  HPI  [name] is a  35 year old female who presents today for follow up of her HTN.  She has been checking at home and has note BP as high as187/146 on Friday night. She reports + compliance with her medication.  Denies LE edema.   Onychomycosis of toenails-  She continues terbinafine, notes that the affected toenail has grown back.   BV- she notes that she has intermittent recurrence of symptoms.  These symptoms resolved with the use of the metronidazole.  She feels that the metrogel causes her to develop a yeast infection.  She prefers the tabs.  Review of Systems see HPI    Past Medical History  Diagnosis Date  . Anemia   . Allergy   . Hypertension   . History of UTI   . Endometriosis     History   Social History  . Marital Status: Single    Spouse Name: N/A    Number of Children: 2  . Years of Education: N/A   Occupational History  .      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 Smoking status: Never Smoker   . Smokeless tobacco: Never Used  . Alcohol Use: No  . Drug Use: Not on file  . Sexually Active: Not on file   Other Topics Concern  . Not on file   Social History Narrative   Regular exercise: yes    Past Surgical History  Procedure Date  . Abdominal hysterectomy 2003    partial    Family History  Problem Relation Age of Onset  . Arthritis Mother   . Hyperlipidemia Mother   . Hypertension Mother   . Hypertension Father   . Diabetes Father   . Allergies Daughter   . Allergies Son   . Arthritis Maternal Grandmother   . Heart disease Maternal Grandmother   . Stroke Maternal Grandmother   . Hypertension Maternal Grandmother   . Leukemia Paternal Grandmother     Allergies  Allergen Reactions  . Ace Inhibitors     REACTION: lip swelling    Current Outpatient Prescriptions on File Prior to Visit    Medication Sig Dispense Refill  . citalopram (CELEXA) 40 MG tablet Take 40 mg by mouth daily.        . hydrochlorothiazide 25 MG tablet Take 25 mg by mouth daily.        . metoprolol (LOPRESSOR) 50 MG tablet Take 50 mg by mouth 2 (two) times daily.        . Multiple Vitamin (MULTIVITAMIN) tablet Take 1 tablet by mouth daily.          BP 134/86  Pulse 84  Temp(Src) 98.1 F (36.7 C) (Oral)  Resp 16  Wt 190 lb 1.9 oz (86.238 kg)    Objective:   Physical Exam  Constitutional: She appears well-developed and well-nourished.  Cardiovascular: Normal rate and regular rhythm.   Pulmonary/Chest: Effort normal and breath sounds normal.  Skin:       Right 2nd toenail has grown back- no significant discoloration or thickening is noted.           Assessment & Plan:

## 2010-05-08 DIAGNOSIS — B9689 Other specified bacterial agents as the cause of diseases classified elsewhere: Secondary | ICD-10-CM | POA: Insufficient documentation

## 2010-05-08 DIAGNOSIS — N760 Acute vaginitis: Secondary | ICD-10-CM | POA: Insufficient documentation

## 2010-05-08 NOTE — Assessment & Plan Note (Signed)
Pt with frequent recurrent symptoms.  No specific complaints today.  I have sent rx to her pharmacy with some refills to be used for "flare ups."

## 2010-05-08 NOTE — Assessment & Plan Note (Signed)
Clinically resolved.  Pt instructed to stop lamisil.

## 2010-05-08 NOTE — Assessment & Plan Note (Signed)
Will add norvasc to her regimen.  Her BP looks reasonable here in the office today.  I have instructed the patient to take BP prior to dose, and only take norvasc if BP >110/80.  F/u in 1 month.

## 2010-06-07 NOTE — H&P (Signed)
NAME:  Nicole Quinn, Nicole Quinn                      ACCOUNT NO.:  [id number]   MEDICAL RECORD NO.:  [id number]                   PATIENT TYPE:  INP   LOCATION:  NA                                   FACILITY:  [facility]   PHYSICIAN:  Katherine Roan, M.D.               DATE OF BIRTH:  [birth year]   DATE OF ADMISSION:  06/17/2002  DATE OF DISCHARGE:                                HISTORY & PHYSICAL   CHIEF COMPLAINT:  Continued abnormal bleeding and pelvic pain.   HISTORY OF PRESENT ILLNESS:  This is a 35 year old, gravida 2, para 2 female  who desires hysterectomy for heavy, painful periods.  She has had two D&C  hysteroscopy with ablation and desires permanent correction. She has also  failed on Depo-Provera and oral contraceptive therapy.   CURRENT MEDICATIONS:  Include atenolol, taken for mild hypertension.  She is  on no other medications.   PAST SURGICAL HISTORY:  1. As noted, she has had the above-mentioned gynecologic procedure.  2. Right carpal tunnel release.  3. Removal of cyst from her right hand.   REVIEW OF SYSTEMS:  HEENT:  She wears glasses but has noted no decrease in  vision or auditory acuity.  HEART:  No chest pain or shortness of breath.  She is on atenolol fo mild hypertension.  LUNGS:  No chronic cough, no  asthma.  GU:  No stress incontinence or history of UTIs.  GI:  No bowel  habit change, no melena, no anorexia.  MUSCLE, BONES, AND JOINTS:  No  fractures or arthritis.   SOCIAL HISTORY:  She works at [employer].  She does not smoke or  drink.   FAMILY HISTORY:  Both parents are 22, living and well.  She has one brother  who is living and well.  She has a mother and aunt with hypertension, and  her father is diabetic.   PHYSICAL EXAMINATION:  GENERAL:  Well-developed, pleasant female who is 35  years of age and appears to be her stated age.  She is oriented to time,  place, and recent events.  VITAL SIGNS:  Weight 166.  Blood pressure 120/80.  HEENT:  Unremarkable.  Oropharynx is not injected.  NECK:  Supple.  Carotid pulses are equal without bruits.  No adenopathy, and  the thyroid is not enlarged.  No bruits are heard.  BREASTS: No masses or tenderness.  LUNGS:  Clear to percussion and auscultation.  HEART:  Normal sinus rhythm, no murmur.  ABDOMEN:  Soft.  Live, spleen, or kidneys are not palpated.  Bowel sounds  are active and normal.  No bruits heard.  Femoral pulses are equal.  PELVIC:  Examination reveals a clean cervix.  Uterus is anterior, slightly  irregular and tender.  Adnexa negative.  EXTREMITIES:  Good range of motion.  Equal pulses and reflexes.   IMPRESSION:  Continued abnormal bleeding despite numerous conservative  attempts.   PLAN:  Hysterectomy.  Risks have been discussed with Yuritza including  bladder damage, bowel damage, infection, and hemorrhage with resulting risk  of a blood transfusion.  We have discussed the fact that she is 35 years of  age and is proceeding with definitive surgery.  She understands all of these  risks and is prepared to proceed.                                               Katherine Roan, M.D.    SDM/MEDQ  D:  06/17/2002  T:  06/17/2002  Job:  [id number]

## 2010-06-07 NOTE — Discharge Summary (Signed)
   NAME:  CHESNI, VOS                      ACCOUNT NO.:  [id number]   MEDICAL RECORD NO.:  [id number]                   PATIENT TYPE:  INP   LOCATION:  1610                                 [facility]ACILITY:  [facility]   PHYSICIAN:  Katherine Roan, M.D.               DATE OF BIRTH:  30-Oct-1975   DATE OF ADMISSION:  06/20/2002  DATE OF DISCHARGE:  06/22/2002                                 DISCHARGE SUMMARY   ADMISSION DIAGNOSES:  Menstrual pain and dysmenorrhea unresponsive to  conventional diagnosis.   DISCHARGE DIAGNOSES:  Menstrual pain and dysmenorrhea unresponsive to  conventional diagnosis.   OPERATION PERFORMED:  1. Pelvic examination under anesthesia.  2. Total hysterectomy.  3. Excision of cul-de-sac endometriosis.   BRIEF HISTORY:  The patient is a young 35 year old gravida 2, para 2 female  who had had numerous attempts at controlling her heavy, painful periods and  had opted for definitive therapy.  Prior to surgery her age and implications  of hysterectomy had been discussed at length.   A Pap smear was normal.   Her hemoglobin was 12.  Her routine chemistries revealed a potassium of 3.4.   Her cardiogram was said to be normal.   HOSPITAL COURSE:  The patient was admitted to the hospital and underwent a  hysterectomy with excision of cul-de-sac endometriosis.  Her postoperative  course was uncomplicated.  She was discharged on the second of June to home  and office care.  Asked to call for fever, bleeding, or any other  difficulties she may encounter.  She will return to the office in two weeks.   CONDITION ON DISCHARGE:  Improved.                                               Katherine Roan, M.D.    SDM/MEDQ  D:  07/04/2002  T:  07/04/2002  Job:  [id number]

## 2010-06-07 NOTE — Op Note (Signed)
NAME:  Nicole Quinn, Nicole Quinn                      ACCOUNT NO.:  [id number]   MEDICAL RECORD NO.:  [id number]                   PATIENT TYPE:  INP   LOCATION:  NA                                   FACILITY:  [facility]   PHYSICIAN:  Katherine Roan, M.D.               DATE OF BIRTH:  08-19-1975   DATE OF PROCEDURE:  06/20/2002  DATE OF DISCHARGE:                                 OPERATIVE REPORT   PREOPERATIVE DIAGNOSES:  Severe dysmenorrhea and menorrhagia, uncontrolled  with conservative measures, including hysteroscopy with ablation, dilatation  and curettage, oral contraceptive therapy and Depo-Provera.   POSTOPERATIVE DIAGNOSES:  Severe dysmenorrhea and menorrhagia, uncontrolled  with conservative measures, including hysteroscopy with ablation, dilatation  and curettage, oral contraceptive therapy and Depo-Provera, plus  endometriosis.   OPERATIONS PERFORMED:  Pelvic exam under anesthesia, excision of cul-de-sac,  endometriosis and total abdominal hysterectomy.   SURGEON:  Katherine Roan, M.D.   DESCRIPTION OF PROCEDURE:  The patient was placed in lithotomy position and  prepped and draped in the usual fashion.  Prior to preop exam under  anesthesia, confirmed a mobile, midline, essentially normal size uterus with  no masses.  A transverse incision was made in the abdomen after it was  infiltrated with 20 cc of 0.5% Marcaine with epinephrine, hemostasis with  the Bovie and the peritoneum was entered vertically.  The upper abdomen  appeared normal.  The abdominal viscera then packed away from the pelvic  viscera and visualization of the pelvis revealed cul-de-sac endometriosis,  both ovaries were free without evidence of involvement.  Cornual aspects of  the uterus were grasped and the utero ovarian anastomosis transected, as was  the round ligament.  The uterine vessels were skeletonized, the peritoneum  was dissected off the cervix.  The uterine vessels were skeletonized and  ligated with 0 chromic sutures, the cardinals and uterosacral ligaments were  handled in a similar fashion.  __________ of the vagina were entered, the  vagina was then closed in a vertical fashion with horizontal mattress  sutures of 0 chromic and 0 Vicryl.  The area of endometriosis was excised  and then closed with interrupted sutures of 2-0 Vicryl.  This in essence  provided culdoplasty.  The pelvis was then reperitonealized with interrupted  sutures of 3-0 Vicryl.  The pelvis was irrigated.  Parietal peritoneum was  closed with 2-0 PDS, sponge and needle count was verified to be correct.  The fascia was closed with a continuous suture of 2-0 PDS and one  interrupted suture at the midline.  The subcutaneum was closed with 3-0  Vicryl and the skin was closed with 3-0 plain.  A coverlet was applied, the  patient tolerated the procedure well.  Katherine Roan, M.D.    SDM/MEDQ  D:  06/20/2002  T:  06/20/2002  Job:  [id number]

## 2010-06-12 ENCOUNTER — Telehealth: Payer: Self-pay | Admitting: Family

## 2010-06-12 MED ORDER — HYDROCHLOROTHIAZIDE 25 MG PO TABS: 25.0000 mg | ORAL_TABLET | Freq: Every day | ORAL | Status: DC

## 2010-06-12 NOTE — Telephone Encounter (Signed)
Refill- hydrochlorothi 25mg  tab. Take one tablet by mouth every day. Qty 30. Last fill 4.23.12

## 2010-06-12 NOTE — Telephone Encounter (Signed)
Refill sent to pharmacy #30 x no refills with note to be seen for further refills.

## 2010-06-18 ENCOUNTER — Telehealth: Payer: Self-pay | Admitting: *Deleted

## 2010-06-18 NOTE — Telephone Encounter (Signed)
Pt left message stating she has been trying to refill her HCTZ since last week and the pharmacy told her we have not responded. Spoke with pt and advised her that refill was sent to [facility] on 06/12/10 and I would check with them once they open at 9am re: status. Pt scheduled f/u for 07/09/10 @ 3:45.

## 2010-06-18 NOTE — Telephone Encounter (Signed)
Spoke to pharmacy and they stated that Rx was ready to be picked up. Notified pt.

## 2010-06-20 ENCOUNTER — Other Ambulatory Visit: Payer: Self-pay | Admitting: Family

## 2010-06-20 NOTE — Telephone Encounter (Signed)
escribed by Nicki Guadalajara on 5/23--did not go through

## 2010-07-09 ENCOUNTER — Ambulatory Visit: Payer: Managed Care, Other (non HMO) | Admitting: Family

## 2010-07-10 ENCOUNTER — Encounter: Payer: Self-pay | Admitting: Family

## 2010-07-10 ENCOUNTER — Ambulatory Visit (INDEPENDENT_AMBULATORY_CARE_PROVIDER_SITE_OTHER): Payer: Managed Care, Other (non HMO) | Admitting: Family

## 2010-07-10 DIAGNOSIS — I10 Essential (primary) hypertension: Secondary | ICD-10-CM

## 2010-07-10 MED ORDER — HYDROCHLOROTHIAZIDE 25 MG PO TABS: 25.0000 mg | ORAL_TABLET | Freq: Every day | ORAL | Status: DC

## 2010-07-10 NOTE — Assessment & Plan Note (Signed)
BP Readings from Last 3 Encounters:  07/10/10 124/80  05/07/10 134/86  03/13/10 122/88  BP looks good- only taking HCTZ.  Continue on this med only, will repeat in 3 months time.

## 2010-07-10 NOTE — Progress Notes (Signed)
  Subjective:    Patient ID: Nicole Quinn, female    DOB: 1975-09-06, 35 y.o.   MRN: [id number]  HPI  Patient presents today for followup of hypertension.  Patient has been treated for Chronic HTN for quiet sometime. She is currently on HCTZ, and well controlled. No associated S/S related to HTN. Stopped amlodipine and metoprolol due to fatigue several weeks ago.    Quality: chronic Modifying factor: meds Duration: Quite sometime Associated S/S: None.  The patient denies the following associated symptoms: Chest pain, dyspnea, blurred vision, headache, or lower extremity edema.   Review of Systems See HPI  Past Medical History  Diagnosis Date  . Anemia   . Allergy   . Hypertension   . History of UTI   . Endometriosis     History   Social History  . Marital Status: Single    Spouse Name: N/A    Number of Children: 2  . Years of Education: N/A   Occupational History  .      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 Smoking status: Never Smoker   . Smokeless tobacco: Never Used  . Alcohol Use: No  . Drug Use: Not on file  . Sexually Active: Not on file   Other Topics Concern  . Not on file   Social History Narrative   Regular exercise: yes    Past Surgical History  Procedure Date  . Abdominal hysterectomy 2003    partial    Family History  Problem Relation Age of Onset  . Arthritis Mother   . Hyperlipidemia Mother   . Hypertension Mother   . Hypertension Father   . Diabetes Father   . Allergies Daughter   . Allergies Son   . Arthritis Maternal Grandmother   . Heart disease Maternal Grandmother   . Stroke Maternal Grandmother   . Hypertension Maternal Grandmother   . Leukemia Paternal Grandmother     Allergies  Allergen Reactions  . Ace Inhibitors     REACTION: lip swelling    Current Outpatient Prescriptions on File Prior to Visit  Medication Sig Dispense Refill  . citalopram (CELEXA) 40 MG tablet Take 40 mg by  mouth daily.        . Multiple Vitamin (MULTIVITAMIN) tablet Take 1 tablet by mouth daily.        Marland Kitchen DISCONTD: amLODipine (NORVASC) 5 MG tablet Take 1 tablet (5 mg total) by mouth daily.  30 tablet  2  . DISCONTD: hydrochlorothiazide 25 MG tablet TAKE ONE TABLET BY MOUTH EVERY DAY  30 tablet  0  . DISCONTD: metoprolol (LOPRESSOR) 50 MG tablet Take 50 mg by mouth 2 (two) times daily.          BP 124/80  Pulse 80  Temp(Src) 98 F (36.7 C) (Oral)  Resp 18  Wt 176 lb (79.833 kg)  SpO2 100%       Objective:   Physical Exam  Constitutional: She appears well-developed and well-nourished.  Cardiovascular: Normal rate and regular rhythm.   Pulmonary/Chest: Effort normal and breath sounds normal.  Musculoskeletal: She exhibits no edema.  Psychiatric: She has a normal mood and affect. Her behavior is normal. Judgment and thought content normal.          Assessment & Plan:

## 2010-07-10 NOTE — Patient Instructions (Signed)
Follow up in 3 months

## 2010-07-23 ENCOUNTER — Other Ambulatory Visit: Payer: Self-pay | Admitting: *Deleted

## 2010-07-23 DIAGNOSIS — L709 Acne, unspecified: Secondary | ICD-10-CM

## 2010-07-23 NOTE — Telephone Encounter (Signed)
Patient called and left voice message stating he dermatologist is retired and her face is starting to break out with acne. She is wanting to know if Melissa would prescribe the medication for her acne that her dermatologist has prescribed for her.

## 2010-07-23 NOTE — Telephone Encounter (Signed)
Call placed to patient [phone], she was asked to verify the rx needed . She states she was seen by Dr Doristine Section and the name of the medication is claravis apply bid to affected area as needed.

## 2010-07-23 NOTE — Telephone Encounter (Signed)
I can rx but need to know name and dose of med pls.

## 2010-07-26 ENCOUNTER — Telehealth: Payer: Self-pay | Admitting: Family

## 2010-07-26 NOTE — Telephone Encounter (Signed)
See phone note from 07/23/2010

## 2010-07-26 NOTE — Telephone Encounter (Signed)
CLAVARIS 40MG  CAPSULES TAKE 1 AM AND 1 PM.  PLEASE SEND TO [facility] ON [address] IN [place]

## 2010-07-26 NOTE — Telephone Encounter (Signed)
Dr Milinda Cave in [place] absence is it okay to fill the acne medication for this patient?

## 2010-07-26 NOTE — Telephone Encounter (Signed)
Call placed to patient at [phone], she was informed that medication refill was not approved. She was advised to check with someone in the dermatologist office for approval. She stated the provider was in a private practice and retired. She was asked when she last had the medication refilled,and stated that she could not recall. Patient asked that Efraim Kaufmann be consulted regarding the medication refill. She was informed message would be forwarded to [facility] and some would return patients call on Monday regarding the refill status. Patient has verbalized understanding and agrees to wait until 07/29/2010

## 2010-07-26 NOTE — Telephone Encounter (Signed)
No, because this medication can only be prescribed by health care professionals registered with the [facility] program (these are almost always dermatologists).  Sorry.--PM

## 2010-07-27 NOTE — Telephone Encounter (Signed)
Pls call patient and let her know that I agree with Dr. Milinda Cave re: this med needing to be prescribed by dermatology. I will refer her to a new dermatologist.

## 2010-07-29 ENCOUNTER — Other Ambulatory Visit: Payer: Self-pay | Admitting: *Deleted

## 2010-07-29 NOTE — Telephone Encounter (Signed)
Request denied. See 07/23/10 phone note.

## 2010-07-29 NOTE — Telephone Encounter (Signed)
Left message on machine to return my call. 

## 2010-07-29 NOTE — Telephone Encounter (Signed)
Pt requesting refill on Claravis. Please advise if ok to refill and how many refills?

## 2010-07-30 NOTE — Telephone Encounter (Signed)
Pt has been notified and is agreeable.

## 2010-07-30 NOTE — Telephone Encounter (Signed)
Left message at work # [phone] to return my call.

## 2010-08-13 ENCOUNTER — Other Ambulatory Visit: Payer: Self-pay | Admitting: Family

## 2010-08-13 NOTE — Telephone Encounter (Signed)
Refill sent.

## 2010-08-13 NOTE — Telephone Encounter (Signed)
Please advise re: refill request of Metronidazole.

## 2010-08-14 ENCOUNTER — Other Ambulatory Visit: Payer: Self-pay | Admitting: Family

## 2010-08-20 ENCOUNTER — Other Ambulatory Visit: Payer: Self-pay | Admitting: *Deleted

## 2010-08-20 NOTE — Telephone Encounter (Signed)
Received voice message from pt stating she had requested a refill from her pharmacy and the pharmacy told pt she needed to contact us. Attempted to reach pt and left message on voicemail to return my call. Pt returned my call and states she is needing refill on Metronidazole. Advised pt that we sent authorization to her pharmacy on 08/13/10. Pharmacy had sent two additional requests to [place] on 08/14/10 for the same med and I responded back to the pharmacy that refill had been authorized on 08/13/10. Advised pt to check back with the pharmacy. Pt voices understanding.

## 2010-09-16 ENCOUNTER — Other Ambulatory Visit: Payer: Self-pay | Admitting: Family

## 2010-09-17 ENCOUNTER — Other Ambulatory Visit: Payer: Self-pay | Admitting: Family

## 2010-09-18 ENCOUNTER — Other Ambulatory Visit: Payer: Self-pay | Admitting: Family

## 2010-09-18 NOTE — Telephone Encounter (Signed)
Pt needs visit please.

## 2010-09-18 NOTE — Telephone Encounter (Signed)
Spoke to pt and scheduled f/u for 09/20/10 @ 3:15pm.

## 2010-09-20 ENCOUNTER — Ambulatory Visit: Payer: Managed Care, Other (non HMO) | Admitting: Family

## 2010-09-27 ENCOUNTER — Ambulatory Visit: Payer: Managed Care, Other (non HMO) | Admitting: Family

## 2010-10-04 ENCOUNTER — Ambulatory Visit: Payer: Managed Care, Other (non HMO) | Admitting: Family

## 2010-10-04 ENCOUNTER — Ambulatory Visit (INDEPENDENT_AMBULATORY_CARE_PROVIDER_SITE_OTHER): Payer: Managed Care, Other (non HMO) | Admitting: Family

## 2010-10-04 ENCOUNTER — Encounter: Payer: Self-pay | Admitting: Family

## 2010-10-04 VITALS — BP 130/90 | HR 84 | Temp 98.1°F | Resp 16 | Ht 65.0 in | Wt 166.1 lb

## 2010-10-04 DIAGNOSIS — N760 Acute vaginitis: Secondary | ICD-10-CM

## 2010-10-04 DIAGNOSIS — A499 Bacterial infection, unspecified: Secondary | ICD-10-CM

## 2010-10-04 DIAGNOSIS — I10 Essential (primary) hypertension: Secondary | ICD-10-CM

## 2010-10-04 DIAGNOSIS — B9689 Other specified bacterial agents as the cause of diseases classified elsewhere: Secondary | ICD-10-CM

## 2010-10-04 LAB — BASIC METABOLIC PANEL
BUN: 18 mg/dL (ref 6–23)
CO2: 27 mEq/L (ref 19–32)
Calcium: 9.4 mg/dL (ref 8.4–10.5)
Chloride: 102 mEq/L (ref 96–112)
Creat: 0.86 mg/dL (ref 0.50–1.10)
Glucose, Bld: 81 mg/dL (ref 70–99)
Potassium: 3.9 mEq/L (ref 3.5–5.3)
Sodium: 137 mEq/L (ref 135–145)

## 2010-10-04 MED ORDER — HYDROCHLOROTHIAZIDE 25 MG PO TABS: 25.0000 mg | ORAL_TABLET | Freq: Every day | ORAL | Status: DC

## 2010-10-04 MED ORDER — METRONIDAZOLE 500 MG PO TABS: 500.0000 mg | ORAL_TABLET | Freq: Three times a day (TID) | ORAL | Status: DC

## 2010-10-04 NOTE — Progress Notes (Signed)
Subjective:    Patient ID: Nicole Quinn, female    DOB: 1975/08/13, 35 y.o.   MRN: [id number]  HPI  1. HTN- Patient presents today for followup of hypertension.   Patient has been treated for Chronic HTN for quiet sometime. She is currently on HCTZ, and well controlled. No associated S/S related to HTN.   Quality: chronic Modifying factor: meds Duration: Quite sometime Associated S/S: None.  The patient denies the following associated symptoms: Chest pain, dyspnea, blurred vision, headache, or lower extremity edema.  2. Bacterial Vaginosis-  These symptoms started about 2 weeks ago.  Thin vaginal discharge + odor, worse after sex.   3.  Obesity-   The patient has lost nearly 40 pounds since last winter with exercise and watching her diet.     Review of Systems    see HPI  Past Medical History  Diagnosis Date  . Anemia   . Allergy   . Hypertension   . History of UTI   . Endometriosis     History   Social History  . Marital Status: Single    Spouse Name: N/A    Number of Children: 2  . Years of Education: N/A   Occupational History  .      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 Smoking status: Never Smoker   . Smokeless tobacco: Never Used  . Alcohol Use: No  . Drug Use: Not on file  . Sexually Active: Not on file   Other Topics Concern  . Not on file   Social History Narrative   Regular exercise: yes    Past Surgical History  Procedure Date  . Abdominal hysterectomy 2003    partial    Family History  Problem Relation Age of Onset  . Arthritis Mother   . Hyperlipidemia Mother   . Hypertension Mother   . Hypertension Father   . Diabetes Father   . Allergies Daughter   . Allergies Son   . Arthritis Maternal Grandmother   . Heart disease Maternal Grandmother   . Stroke Maternal Grandmother   . Hypertension Maternal Grandmother   . Leukemia Paternal Grandmother     Allergies  Allergen Reactions  . Ace  Inhibitors     REACTION: lip swelling    Current Outpatient Prescriptions on File Prior to Visit  Medication Sig Dispense Refill  . citalopram (CELEXA) 40 MG tablet Take 40 mg by mouth daily.        . hydrochlorothiazide 25 MG tablet Take 1 tablet (25 mg total) by mouth daily.  90 tablet  0  . metroNIDAZOLE (FLAGYL) 500 MG tablet TAKE ONE TABLET BY MOUTH THREE TIMES DAILY  21 tablet  0  . Multiple Vitamin (MULTIVITAMIN) tablet Take 1 tablet by mouth daily.          BP 130/90  Pulse 84  Temp(Src) 98.1 F (36.7 C) (Oral)  Resp 16  Ht 5\' 5"  (1.651 m)  Wt 166 lb 1.3 oz (75.333 kg)  BMI 27.64 kg/m2    Objective:   Physical Exam  Constitutional: She appears well-developed and well-nourished.  Cardiovascular: Normal rate and regular rhythm.   No murmur heard. Pulmonary/Chest: Effort normal and breath sounds normal. No respiratory distress. She has no wheezes. She has no rales. She exhibits no tenderness.  Genitourinary:       Uterus surgically absent.  White vaginal discharge is noted.            Assessment &  Plan:

## 2010-10-04 NOTE — Assessment & Plan Note (Signed)
BP is stable.  I commended her on her weight loss.  Continue HCTZ, check BMET today.

## 2010-10-04 NOTE — Assessment & Plan Note (Signed)
Wet prep performed today.  Will refer to GYN for further recommendations given recurrent symptoms. Refill on metronidazole sent to pharmacy.  Pt instructed to hold off on starting med until we receive Wet prep results as the discharge is thick today and it could be due to yeast.

## 2010-10-04 NOTE — Patient Instructions (Signed)
Complete your lab work on the first floor today.  You will be contacted about your referral to GYN. Please follow up in 6 months, sooner if problems or concerns.

## 2010-10-05 LAB — WET PREP BY MOLECULAR PROBE
Candida species: NEGATIVE
Gardnerella vaginalis: POSITIVE — AB
Trichomonas vaginosis: NEGATIVE

## 2010-10-06 ENCOUNTER — Telehealth: Payer: Self-pay | Admitting: Family

## 2010-10-06 NOTE — Telephone Encounter (Signed)
Pls call pt and let her know that her wet prep does show bacterial vaginosis. She should complete metronidazole and plan to see GYN as we discussed.  Myriam Jacobson will call her with appointment.

## 2010-10-07 NOTE — Telephone Encounter (Signed)
Pt.notified

## 2010-11-24 ENCOUNTER — Other Ambulatory Visit: Payer: Self-pay | Admitting: Family

## 2010-11-25 NOTE — Telephone Encounter (Signed)
Please advise 

## 2010-11-27 NOTE — Telephone Encounter (Signed)
I sent refill, but please call pt and see if she saw GYN (we referred her last visit for her chronic infections).

## 2010-11-28 NOTE — Telephone Encounter (Signed)
Pt has first appt with GYN on 12/16/10 @ 3:30pm.

## 2010-11-28 NOTE — Telephone Encounter (Signed)
Left message on machine to return my call. 

## 2011-02-11 ENCOUNTER — Ambulatory Visit: Payer: Managed Care, Other (non HMO) | Admitting: Family

## 2011-02-11 ENCOUNTER — Ambulatory Visit (INDEPENDENT_AMBULATORY_CARE_PROVIDER_SITE_OTHER): Payer: Managed Care, Other (non HMO) | Admitting: Family

## 2011-02-11 ENCOUNTER — Encounter: Payer: Self-pay | Admitting: Family

## 2011-02-11 ENCOUNTER — Other Ambulatory Visit: Payer: Self-pay | Admitting: Family

## 2011-02-11 DIAGNOSIS — I10 Essential (primary) hypertension: Secondary | ICD-10-CM

## 2011-02-11 DIAGNOSIS — N760 Acute vaginitis: Secondary | ICD-10-CM

## 2011-02-11 DIAGNOSIS — B9689 Other specified bacterial agents as the cause of diseases classified elsewhere: Secondary | ICD-10-CM

## 2011-02-11 DIAGNOSIS — A499 Bacterial infection, unspecified: Secondary | ICD-10-CM

## 2011-02-11 DIAGNOSIS — L708 Other acne: Secondary | ICD-10-CM

## 2011-02-11 MED ORDER — BENZOYL PEROXIDE-ERYTHROMYCIN 5-3 % EX GEL: Freq: Two times a day (BID) | CUTANEOUS | Status: DC

## 2011-02-11 MED ORDER — SPIRONOLACTONE 25 MG PO TABS: 25.0000 mg | ORAL_TABLET | Freq: Every day | ORAL | Status: DC

## 2011-02-11 NOTE — Telephone Encounter (Signed)
Will discuss with pt at appt today

## 2011-02-11 NOTE — Patient Instructions (Addendum)
Try Replens over the counter for vaginal dryness.   Please follow up in 1 month.

## 2011-02-11 NOTE — Progress Notes (Signed)
Addended by: Mervin Kung A on: 02/11/2011 01:39 PM   Modules accepted: Orders

## 2011-02-11 NOTE — Assessment & Plan Note (Signed)
Stable.  Will switch to aldactone for her acne.  Repeat BP and obtain BMET next visit in 1 month.

## 2011-02-11 NOTE — Progress Notes (Signed)
  Subjective:    Patient ID: Nicole Quinn, female    DOB: [birth year], 36 y.o.   MRN: [id number]  HPI  Ms.  [name] is a 36 yr old female who presents today for follow up.  HTN- tolerating hctz.  Some days has some swelling, denies cp, shortness of breath.    Vaginal odor, discharge- clear.  Worse after intercourse. Complains of vaginal dryness.  Acne- she is following with laura lomax- stopped doxy. She also tried bactrim which she stopped as it wasn't working. She thinks that the antibiotics cause worsening of her vaginal symptoms.  She has tried birth control pills without improvement.     Review of Systems See HPI  Past Medical History  Diagnosis Date  . Anemia   . Allergy   . Hypertension   . History of UTI   . Endometriosis     History   Social History  . Marital Status: Single    Spouse Name: N/A    Number of Children: 2  . Years of Education: N/A   Occupational History  .      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 Smoking status: Never Smoker   . Smokeless tobacco: Never Used  . Alcohol Use: No  . Drug Use: Not on file  . Sexually Active: Not on file   Other Topics Concern  . Not on file   Social History Narrative   Regular exercise: yes    Past Surgical History  Procedure Date  . Abdominal hysterectomy 2003    partial    Family History  Problem Relation Age of Onset  . Arthritis Mother   . Hyperlipidemia Mother   . Hypertension Mother   . Hypertension Father   . Diabetes Father   . Allergies Daughter   . Allergies Son   . Arthritis Maternal Grandmother   . Heart disease Maternal Grandmother   . Stroke Maternal Grandmother   . Hypertension Maternal Grandmother   . Leukemia Paternal Grandmother     Allergies  Allergen Reactions  . Ace Inhibitors     REACTION: lip swelling    Current Outpatient Prescriptions on File Prior to Visit  Medication Sig Dispense Refill  . Multiple Vitamin (MULTIVITAMIN) tablet  Take 1 tablet by mouth daily.        Marland Kitchen doxycycline (MONODOX) 100 MG capsule Take 100 mg by mouth 2 (two) times daily.        . metroNIDAZOLE (FLAGYL) 500 MG tablet TAKE ONE TABLET BY MOUTH THREE TIMES DAILY  21 tablet  0    BP 130/90  Pulse 78  Temp(Src) 98.1 F (36.7 C) (Oral)  Resp 16  Wt 157 lb (71.215 kg)       Objective:   Physical Exam  Constitutional: She appears well-developed and well-nourished. No distress.  HENT:  Head: Normocephalic and atraumatic.  Cardiovascular: Normal rate and regular rhythm.   No murmur heard. Pulmonary/Chest: Breath sounds normal. No respiratory distress. She has no wheezes. She has no rales. She exhibits no tenderness.  Genitourinary:       + vaginal odor noted. Wet prep performed today.  Musculoskeletal: She exhibits no edema.  Skin:       Acne noted on cheeks  Psychiatric: She has a normal mood and affect. Her behavior is normal. Judgment and thought content normal.          Assessment & Plan:

## 2011-02-11 NOTE — Assessment & Plan Note (Signed)
Will try switching her from HCTZ to aldactone to see if this helps.  Will also add benzamycin gel.  She has stopped oral abx.

## 2011-02-11 NOTE — Assessment & Plan Note (Signed)
Probable BV. Await wet prep results to confirm and then plan to treat.

## 2011-02-11 NOTE — Telephone Encounter (Signed)
Pt was referred to GYN in September for treatment of bacterial vaginosis. Pt should contact GYN.

## 2011-02-12 ENCOUNTER — Telehealth: Payer: Self-pay | Admitting: Family

## 2011-02-12 LAB — WET PREP BY MOLECULAR PROBE
Candida species: NEGATIVE
Gardnerella vaginalis: POSITIVE — AB
Trichomonas vaginosis: NEGATIVE

## 2011-02-12 MED ORDER — METRONIDAZOLE 500 MG PO TABS: 500.0000 mg | ORAL_TABLET | Freq: Three times a day (TID) | ORAL | Status: AC

## 2011-02-12 NOTE — Telephone Encounter (Signed)
Pls call pt and let her know that her wet prep confirms Bacterial Vaginosis.  Rx for metronidazole tabs sent to her pharmacy.

## 2011-02-12 NOTE — Telephone Encounter (Signed)
Notified pt. 

## 2011-02-25 ENCOUNTER — Telehealth: Payer: Self-pay | Admitting: *Deleted

## 2011-02-25 MED ORDER — SPIRONOLACTONE 50 MG PO TABS: 50.0000 mg | ORAL_TABLET | Freq: Every day | ORAL | Status: DC

## 2011-02-25 NOTE — Telephone Encounter (Signed)
Notified pt and scheduled follow up for 03/04/11 at 3:15pm. Pt voices understanding.

## 2011-02-25 NOTE — Telephone Encounter (Signed)
Pt returned my call stating she is having swelling in her hands, legs and feet. Only able to urinate 1 - 2 times in an 8 hr period. Please advise.

## 2011-02-25 NOTE — Telephone Encounter (Signed)
I will increase her aldactone to 50mg  once daily and plan to see her back in the office in 1 week. However if shortness of breath or severe swelling, she will need to be seen in the office sooner.

## 2011-02-25 NOTE — Telephone Encounter (Signed)
Received message from pt that she has been retaining fluid since she stopped the HCTZ, not urinating as much. Wants to know if she can stay on spironolactone and hctz at the same time? Attempted to reach pt and left message for her to call & let us know if she is having swelling in hands, feet or legs. How many times does she urinate per day?

## 2011-02-27 NOTE — Telephone Encounter (Signed)
Received call from pt stating the swelling in her legs is leaving an indention when she crosses her legs. Denies difficulty breathing. Attempted to reach pt and left detailed message to call and schedule appt with [facility] for tomorrow instead of waiting until the 12th.

## 2011-02-27 NOTE — Telephone Encounter (Signed)
Received voice message from pt that she has increased aldactone and is still having swelling, has appt on 03/04/11. Attempted to reach pt and left message on voicemail to call me back with the extent of her swelling. Reminded pt as instructed below to keep appt as scheduled unless swelling is severe or having shortness of breath then she would need to be seen tomorrow.

## 2011-02-28 ENCOUNTER — Ambulatory Visit (INDEPENDENT_AMBULATORY_CARE_PROVIDER_SITE_OTHER): Payer: Managed Care, Other (non HMO) | Admitting: Family

## 2011-02-28 ENCOUNTER — Telehealth: Payer: Self-pay | Admitting: *Deleted

## 2011-02-28 ENCOUNTER — Encounter: Payer: Self-pay | Admitting: Family

## 2011-02-28 VITALS — BP 122/84 | HR 83 | Temp 98.2°F | Resp 18 | Wt 165.0 lb

## 2011-02-28 DIAGNOSIS — N760 Acute vaginitis: Secondary | ICD-10-CM

## 2011-02-28 DIAGNOSIS — B9689 Other specified bacterial agents as the cause of diseases classified elsewhere: Secondary | ICD-10-CM

## 2011-02-28 DIAGNOSIS — Z5181 Encounter for therapeutic drug level monitoring: Secondary | ICD-10-CM

## 2011-02-28 DIAGNOSIS — A499 Bacterial infection, unspecified: Secondary | ICD-10-CM

## 2011-02-28 DIAGNOSIS — L708 Other acne: Secondary | ICD-10-CM

## 2011-02-28 DIAGNOSIS — R609 Edema, unspecified: Secondary | ICD-10-CM

## 2011-02-28 MED ORDER — METRONIDAZOLE 500 MG PO TABS: 500.0000 mg | ORAL_TABLET | Freq: Two times a day (BID) | ORAL | Status: DC

## 2011-02-28 MED ORDER — NORETHINDRONE ACET-ETHINYL EST 1.5-30 MG-MCG PO TABS: 1.0000 | ORAL_TABLET | Freq: Every day | ORAL | Status: DC

## 2011-02-28 MED ORDER — HYDROCHLOROTHIAZIDE 25 MG PO TABS: 25.0000 mg | ORAL_TABLET | Freq: Every day | ORAL | Status: DC

## 2011-02-28 NOTE — Progress Notes (Signed)
  Subjective:    Patient ID: Nicole Quinn, female    DOB: 20-Mar-1975, 36 y.o.   MRN: [id number]  HPI  [name] is a 36 yr old female who presents today for follow up.  Acne-   Last visit her HCTZ was changed to Aldactone to help with her acne.  She notes some improvement in her Acne but notes increased swelling.  She continues to use the benzamycin gel.  Edema- says that it is worse at night.  Feels "bloated all over." reports swelling in face, hands, feet.  BV- notes improvement in the vaginal odor, but still has slight odor.  Review of Systems    see HPI  Past Medical History  Diagnosis Date  . Anemia   . Allergy   . Hypertension   . History of UTI   . Endometriosis     History   Social History  . Marital Status: Single    Spouse Name: N/A    Number of Children: 2  . Years of Education: N/A   Occupational History  .      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 Smoking status: Never Smoker   . Smokeless tobacco: Never Used  . Alcohol Use: No  . Drug Use: Not on file  . Sexually Active: Not on file   Other Topics Concern  . Not on file   Social History Narrative   Regular exercise: yes    Past Surgical History  Procedure Date  . Abdominal hysterectomy 2003    partial    Family History  Problem Relation Age of Onset  . Arthritis Mother   . Hyperlipidemia Mother   . Hypertension Mother   . Hypertension Father   . Diabetes Father   . Allergies Daughter   . Allergies Son   . Arthritis Maternal Grandmother   . Heart disease Maternal Grandmother   . Stroke Maternal Grandmother   . Hypertension Maternal Grandmother   . Leukemia Paternal Grandmother     Allergies  Allergen Reactions  . Ace Inhibitors     REACTION: lip swelling    Current Outpatient Prescriptions on File Prior to Visit  Medication Sig Dispense Refill  . benzoyl peroxide-erythromycin (BENZAMYCIN) gel Apply topically 2 (two) times daily.  23.3 g  2  .  Multiple Vitamin (MULTIVITAMIN) tablet Take 1 tablet by mouth daily.          BP 122/84  Pulse 83  Temp(Src) 98.2 F (36.8 C) (Oral)  Resp 18  Wt 165 lb 0.6 oz (74.862 kg)  SpO2 99%    Objective:   Physical Exam  Constitutional: She appears well-developed and well-nourished. No distress.  HENT:  Head: Normocephalic and atraumatic.  Eyes: Conjunctivae are normal.  Cardiovascular: Normal rate and regular rhythm.   Pulmonary/Chest: Effort normal and breath sounds normal.  Abdominal: Soft. Bowel sounds are normal.  Musculoskeletal: She exhibits no edema.  Skin: Skin is warm and dry.       Acne noted on cheeks- somewhat improved since last visit.   Psychiatric: She has a normal mood and affect. Her behavior is normal. Judgment and thought content normal.          Assessment & Plan:

## 2011-02-28 NOTE — Assessment & Plan Note (Signed)
Improved from Aldactone.   Unfortunately she is holding on to more fluid with this medication. Will continue benzamycin gel.  She is s/p hysterectomy, but will add OCP to see if this helps with acne.

## 2011-02-28 NOTE — Patient Instructions (Signed)
Please follow up in 1 month, sooner if problems/concerns.

## 2011-02-28 NOTE — Telephone Encounter (Signed)
Received call from pharmacy requesting clarification of Flagyl directions. Per Sandford Craze, NP directions are correct at 1 tablet twice a day but quantity should be #14. Notified pharmacist.

## 2011-02-28 NOTE — Telephone Encounter (Signed)
Pt was evaluated in the office today.  ?

## 2011-02-28 NOTE — Assessment & Plan Note (Signed)
Improved, but not resolved. Refill provided of metronidazole in case symptoms worsen.

## 2011-02-28 NOTE — Assessment & Plan Note (Signed)
Pt notes edema worst at night.  No visible edema today on exam, but her weight is up 8 pounds since 1/28.  Will switch her back to hctz (stop aldactone), obtain bmet to evaluated potassium and kidney function.

## 2011-03-04 ENCOUNTER — Ambulatory Visit: Payer: Managed Care, Other (non HMO) | Admitting: Family

## 2011-03-04 ENCOUNTER — Telehealth: Payer: Self-pay | Admitting: *Deleted

## 2011-03-04 MED ORDER — TINIDAZOLE 500 MG PO TABS: 1000.0000 mg | ORAL_TABLET | Freq: Every day | ORAL | Status: DC

## 2011-03-04 NOTE — Telephone Encounter (Signed)
Tinidazole was sent to pharmacy. Call if no improvement with Tinidazole.

## 2011-03-04 NOTE — Telephone Encounter (Signed)
Left detailed message on voicemail and to call if any questions. 

## 2011-03-04 NOTE — Telephone Encounter (Signed)
Received voice message from pt that she doesn't feel like metronidazole is working for her anymore. Pt states she has completed previous rx without relief of symptoms. Pt wants to try Clindamycin or Tinidazole in pill form.  Please advise.

## 2011-03-05 ENCOUNTER — Telehealth: Payer: Self-pay | Admitting: *Deleted

## 2011-03-05 MED ORDER — CLINDAMYCIN HCL 300 MG PO CAPS: 300.0000 mg | ORAL_CAPSULE | Freq: Two times a day (BID) | ORAL | Status: AC

## 2011-03-05 NOTE — Telephone Encounter (Signed)
Left detailed message on voicemail re: rx completion and to call if any questions. 

## 2011-03-05 NOTE — Telephone Encounter (Signed)
Clindamycin sent to pharmacy

## 2011-03-05 NOTE — Telephone Encounter (Signed)
Received voice message from pt that the tinidazole is too expensive and her ins. Told her Clindamycin will be cheaper. Pt is requesting that we send rx for Clindamycin to her pharmacy. Please advise.

## 2011-03-17 ENCOUNTER — Ambulatory Visit: Payer: Managed Care, Other (non HMO) | Admitting: Family

## 2011-03-17 DIAGNOSIS — Z0289 Encounter for other administrative examinations: Secondary | ICD-10-CM

## 2011-06-05 ENCOUNTER — Other Ambulatory Visit: Payer: Self-pay | Admitting: Family

## 2011-06-05 NOTE — Telephone Encounter (Signed)
Left detailed message informing patient that a 30 day supply of hctz has been sent in to the pharmacy and that she does need to make a follow up appointment.

## 2011-06-05 NOTE — Telephone Encounter (Signed)
30 day supply of hctz sent to pharmacy. Pt is due for f/u. Please call pt to arrange.

## 2011-07-01 ENCOUNTER — Ambulatory Visit: Payer: Managed Care, Other (non HMO) | Admitting: Family

## 2011-07-01 DIAGNOSIS — Z0289 Encounter for other administrative examinations: Secondary | ICD-10-CM

## 2011-07-02 ENCOUNTER — Ambulatory Visit: Payer: Managed Care, Other (non HMO) | Admitting: Family

## 2011-07-08 ENCOUNTER — Ambulatory Visit (INDEPENDENT_AMBULATORY_CARE_PROVIDER_SITE_OTHER): Payer: Managed Care, Other (non HMO) | Admitting: Family

## 2011-07-08 ENCOUNTER — Telehealth: Payer: Self-pay | Admitting: *Deleted

## 2011-07-08 ENCOUNTER — Encounter: Payer: Self-pay | Admitting: Family

## 2011-07-08 VITALS — BP 130/96 | HR 88 | Temp 98.4°F | Resp 16 | Ht 65.0 in | Wt 160.1 lb

## 2011-07-08 DIAGNOSIS — L708 Other acne: Secondary | ICD-10-CM

## 2011-07-08 DIAGNOSIS — I10 Essential (primary) hypertension: Secondary | ICD-10-CM

## 2011-07-08 LAB — BASIC METABOLIC PANEL
BUN: 15 mg/dL (ref 6–23)
CO2: 26 mEq/L (ref 19–32)
Calcium: 9.8 mg/dL (ref 8.4–10.5)
Chloride: 100 mEq/L (ref 96–112)
Creat: 0.88 mg/dL (ref 0.50–1.10)
Glucose, Bld: 65 mg/dL — ABNORMAL LOW (ref 70–99)
Potassium: 3.8 mEq/L (ref 3.5–5.3)
Sodium: 137 mEq/L (ref 135–145)

## 2011-07-08 MED ORDER — ADAPALENE 0.3 % EX GEL: 1.0000 "application " | Freq: Every day | CUTANEOUS | Status: DC

## 2011-07-08 MED ORDER — HYDROCHLOROTHIAZIDE 25 MG PO TABS: 25.0000 mg | ORAL_TABLET | Freq: Every day | ORAL | Status: DC

## 2011-07-08 MED ORDER — ADAPALENE-BENZOYL PEROXIDE 0.1-2.5 % EX GEL: 1.0000 "application " | Freq: Every day | CUTANEOUS | Status: DC

## 2011-07-08 NOTE — Assessment & Plan Note (Signed)
Unchanged.  She wishes to continue OCP as it is helping to prevent her recurrent BV.  Will try epiduo in place of benzamycin.  She was not able to tolerate oral abx for her acne due to recurrent yeast infections.

## 2011-07-08 NOTE — Telephone Encounter (Signed)
Received message from pt that the medication prescribed for her acne is going to cost her $275. Wants to know if we can prescribe her a cheaper alternative?

## 2011-07-08 NOTE — Assessment & Plan Note (Signed)
Follow up bp was 130/92 in the office today.  Plan to continue hctz.  Have pt follow back up in 2 months for bp recheck.  Obtain bmet.

## 2011-07-08 NOTE — Progress Notes (Signed)
  Subjective:    Patient ID: Nicole Quinn, female    DOB: 1976/01/10, 36 y.o.   MRN: [id number]  HPI  [name] is a 36 yr old female who presents today for follow up.  1) HTN- notes that the HCTZ keeps her swelling in her feet down.  Has been taking it regularly.   2) Acne- she continues the benzamycin gel and the OCP without significant improvement in her acne.  She does think that the OCP is helping to prevent BV.   Review of Systems See HPI  Past Medical History  Diagnosis Date  . Anemia   . Allergy   . Hypertension   . History of UTI   . Endometriosis     History   Social History  . Marital Status: Single    Spouse Name: N/A    Number of Children: 2  . Years of Education: N/A   Occupational History  .      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 Smoking status: Never Smoker   . Smokeless tobacco: Never Used  . Alcohol Use: No  . Drug Use: Not on file  . Sexually Active: Not on file   Other Topics Concern  . Not on file   Social History Narrative   Regular exercise: yes    Past Surgical History  Procedure Date  . Abdominal hysterectomy 2003    partial    Family History  Problem Relation Age of Onset  . Arthritis Mother   . Hyperlipidemia Mother   . Hypertension Mother   . Hypertension Father   . Diabetes Father   . Allergies Daughter   . Allergies Son   . Arthritis Maternal Grandmother   . Heart disease Maternal Grandmother   . Stroke Maternal Grandmother   . Hypertension Maternal Grandmother   . Leukemia Paternal Grandmother     Allergies  Allergen Reactions  . Ace Inhibitors     REACTION: lip swelling    Current Outpatient Prescriptions on File Prior to Visit  Medication Sig Dispense Refill  . benzoyl peroxide-erythromycin (BENZAMYCIN) gel Apply topically 2 (two) times daily.  23.3 g  2  . Multiple Vitamin (MULTIVITAMIN) tablet Take 1 tablet by mouth daily.        . Norethindrone Acetate-Ethinyl  Estradiol (JUNEL,LOESTRIN,MICROGESTIN) 1.5-30 MG-MCG tablet Take 1 tablet by mouth daily.  1 Package  11  . DISCONTD: hydrochlorothiazide (HYDRODIURIL) 25 MG tablet TAKE ONE TABLET BY MOUTH EVERY DAY  30 tablet  0    BP 130/96  Pulse 88  Temp 98.4 F (36.9 C) (Oral)  Resp 16  Ht 5\' 5"  (1.651 m)  Wt 160 lb 1.9 oz (72.63 kg)  BMI 26.65 kg/m2  SpO2 98%       Objective:   Physical Exam  Constitutional: She appears well-developed and well-nourished. No distress.  Cardiovascular: Normal rate and regular rhythm.   No murmur heard. Pulmonary/Chest: Effort normal and breath sounds normal. No respiratory distress. She has no wheezes. She has no rales. She exhibits no tenderness.  Musculoskeletal: She exhibits no edema.  Skin: Skin is warm and dry.       + acne noted on cheeks.  Psychiatric: She has a normal mood and affect. Her behavior is normal. Judgment and thought content normal.          Assessment & Plan:

## 2011-07-08 NOTE — Patient Instructions (Addendum)
Follow up in 2 months, sooner if problems/concerns.  Complete your lab work prior to leaving.

## 2011-07-08 NOTE — Telephone Encounter (Signed)
Spoke with pt- will see if generic adapalene is any cheaper.  If not, plan refill of benzamycin gel.  Pt verbalizese understanding.

## 2011-07-09 ENCOUNTER — Encounter: Payer: Self-pay | Admitting: Family

## 2011-07-09 MED ORDER — BENZOYL PEROXIDE-ERYTHROMYCIN 5-3 % EX GEL: Freq: Two times a day (BID) | CUTANEOUS | Status: DC

## 2011-07-09 NOTE — Telephone Encounter (Signed)
Pt called stating acne alternative is just as high ($270) as the first alternative.

## 2011-07-09 NOTE — Telephone Encounter (Signed)
Refill sent for benzamycin gel per instructions below and pt has been notified.

## 2011-07-09 NOTE — Addendum Note (Signed)
Addended by: Mervin Kung A on: 07/09/2011 09:14 AM   Modules accepted: Orders

## 2011-07-15 ENCOUNTER — Other Ambulatory Visit: Payer: Self-pay | Admitting: *Deleted

## 2011-07-15 MED ORDER — MINOCYCLINE HCL 50 MG PO CAPS: 50.0000 mg | ORAL_CAPSULE | Freq: Two times a day (BID) | ORAL | Status: DC

## 2011-07-15 NOTE — Telephone Encounter (Signed)
Minocycline 50mg  bid x 10d

## 2011-07-15 NOTE — Telephone Encounter (Signed)
Rx sent to [facility], pt notified.

## 2011-07-15 NOTE — Telephone Encounter (Signed)
Received call from pt stating benzamycin gel is just not clearing up her acne. Wants to know if we will call in Minocycline to take in addition to the gel? If ok to send rx please advise of strength/directions and quantity.

## 2011-07-21 ENCOUNTER — Telehealth: Payer: Self-pay | Admitting: *Deleted

## 2011-07-21 MED ORDER — MINOCYCLINE HCL 50 MG PO CAPS: 50.0000 mg | ORAL_CAPSULE | Freq: Two times a day (BID) | ORAL | Status: DC

## 2011-07-21 NOTE — Telephone Encounter (Signed)
Received voice message from pt stating she will complete minocycline rx on Thursday. Reports that she is still having breakouts. Wants to know if we will refill rx or what do you want pt to do? Please advise.

## 2011-07-21 NOTE — Telephone Encounter (Signed)
I will refill x 30 days. If no improvement at that time, she should be seen by derm.

## 2011-07-21 NOTE — Telephone Encounter (Signed)
Notified pt and she voices understanding. 

## 2011-08-07 ENCOUNTER — Telehealth: Payer: Self-pay | Admitting: *Deleted

## 2011-08-07 NOTE — Telephone Encounter (Signed)
Pt has not returned my call. Please advise.  

## 2011-08-07 NOTE — Telephone Encounter (Signed)
Received message from pt stating she has vaginal irritation that she thinks is caused by her antibiotic for acne. Pt is requesting medication to treat infection. Left message for pt call me back with her symptoms.

## 2011-08-08 MED ORDER — METRONIDAZOLE 500 MG PO TABS: 500.0000 mg | ORAL_TABLET | Freq: Two times a day (BID) | ORAL | Status: DC

## 2011-08-08 NOTE — Telephone Encounter (Signed)
Per verbal from Provider, ok to send metronidazole 500mg  bid x 7 days and to follow up if no improvement of symptoms. Rx sent and detailed message left on pt's voicemail re: completion.

## 2011-08-12 ENCOUNTER — Ambulatory Visit (INDEPENDENT_AMBULATORY_CARE_PROVIDER_SITE_OTHER): Payer: Managed Care, Other (non HMO) | Admitting: Family

## 2011-08-12 ENCOUNTER — Encounter: Payer: Self-pay | Admitting: Family

## 2011-08-12 VITALS — BP 120/88 | HR 93 | Temp 98.1°F | Resp 20 | Ht 65.0 in | Wt 162.0 lb

## 2011-08-12 DIAGNOSIS — J029 Acute pharyngitis, unspecified: Secondary | ICD-10-CM | POA: Insufficient documentation

## 2011-08-12 MED ORDER — AMOXICILLIN 500 MG PO CAPS: 500.0000 mg | ORAL_CAPSULE | Freq: Three times a day (TID) | ORAL | Status: AC

## 2011-08-12 MED ORDER — HYDROCODONE-ACETAMINOPHEN 5-500 MG PO TABS: 1.0000 | ORAL_TABLET | Freq: Three times a day (TID) | ORAL | Status: AC | PRN

## 2011-08-12 NOTE — Progress Notes (Signed)
Subjective:    Patient ID: Nicole Quinn, female    DOB: [birth year], 36 y.o.   MRN: [id number]  HPI  Nicole Quinn is a 35 yr old female who presents today with chief complaint of sore throat.  She reports that her sore throat started on 7/20.  She reports associated + SOB and Myalgia.  She denies associated fever.  Reports sore throat is severe and rates pain 10/10.  She has used advil without relief.    Vaginitis- she reports that vaginal symptoms have resolved with use of metronidazole.   Review of Systems See HPI  Past Medical History  Diagnosis Date  . Anemia   . Allergy   . Hypertension   . History of UTI   . Endometriosis     History   Social History  . Marital Status: Single    Spouse Name: N/A    Number of Children: 2  . Years of Education: N/A   Occupational History  .      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 Smoking status: Never Smoker   . Smokeless tobacco: Never Used  . Alcohol Use: No  . Drug Use: Not on file  . Sexually Active: Not on file   Other Topics Concern  . Not on file   Social History Narrative   Regular exercise: yes    Past Surgical History  Procedure Date  . Abdominal hysterectomy 2003    partial    Family History  Problem Relation Age of Onset  . Arthritis Mother   . Hyperlipidemia Mother   . Hypertension Mother   . Hypertension Father   . Diabetes Father   . Allergies Daughter   . Allergies Son   . Arthritis Maternal Grandmother   . Heart disease Maternal Grandmother   . Stroke Maternal Grandmother   . Hypertension Maternal Grandmother   . Leukemia Paternal Grandmother     Allergies  Allergen Reactions  . Ace Inhibitors     REACTION: lip swelling    Current Outpatient Prescriptions on File Prior to Visit  Medication Sig Dispense Refill  . benzoyl peroxide-erythromycin (BENZAMYCIN) gel Apply topically 2 (two) times daily.  23.3 g  2  . hydrochlorothiazide (HYDRODIURIL) 25 MG tablet  Take 1 tablet (25 mg total) by mouth daily.  30 tablet  2  . metroNIDAZOLE (FLAGYL) 500 MG tablet Take 1 tablet (500 mg total) by mouth 2 (two) times daily.  14 tablet  0  . minocycline (MINOCIN,DYNACIN) 50 MG capsule Take 1 capsule (50 mg total) by mouth 2 (two) times daily.  60 capsule  0  . Multiple Vitamin (MULTIVITAMIN) tablet Take 1 tablet by mouth daily.        . Norethindrone Acetate-Ethinyl Estradiol (JUNEL,LOESTRIN,MICROGESTIN) 1.5-30 MG-MCG tablet Take 1 tablet by mouth daily.  1 Package  11    BP 120/88  Pulse 93  Temp 98.1 F (36.7 C) (Oral)  Resp 20  Ht 5\' 5"  (1.651 m)  Wt 162 lb 0.6 oz (73.501 kg)  BMI 26.96 kg/m2  SpO2 99%       Objective:   Physical Exam  Constitutional: She appears well-developed and well-nourished.       Briefly became tearful during examination due to discomfort of throat pain.  HENT:  Head: Normocephalic and atraumatic.  Right Ear: Tympanic membrane and ear canal normal.  Left Ear: Tympanic membrane and ear canal normal.  Mouth/Throat: Posterior oropharyngeal erythema present. No posterior oropharyngeal edema.  L tonsillar exudate is noted  Cardiovascular: Normal rate and regular rhythm.   No murmur heard. Pulmonary/Chest: Effort normal and breath sounds normal. No respiratory distress. She has no wheezes. She has no rales. She exhibits no tenderness.  Lymphadenopathy:    She has cervical adenopathy.  Skin: Skin is warm and dry.  Psychiatric: She has a normal mood and affect. Her behavior is normal. Judgment and thought content normal.          Assessment & Plan:

## 2011-08-12 NOTE — Assessment & Plan Note (Signed)
Rapid strep is negative but strong clinical suspicion for strep.  Will treat with amoxicillin.  Rx sent for diflucan prn in case of vaginal yeast infection.  Rx provided for short course of vicodin due to severity of pain.  Pt advised not to drive while using this med.  When able, she should switch to motrin prn.  Pt verbalizes understanding.  Note provided for work.

## 2011-08-12 NOTE — Patient Instructions (Addendum)
Please call if your symptoms worsen or if you are not feeling better in 2-3 days.  

## 2011-08-18 ENCOUNTER — Other Ambulatory Visit: Payer: Self-pay | Admitting: Family

## 2011-08-18 ENCOUNTER — Telehealth: Payer: Self-pay | Admitting: *Deleted

## 2011-08-18 NOTE — Telephone Encounter (Signed)
Stop antibiotic.  She can use benadryl as needed.  I would like to see her back in the office tomorrow.  If worsening shortness of breath, she should be seen in the ED overnight.

## 2011-08-18 NOTE — Telephone Encounter (Signed)
Received message from pt stating she is still taking antibiotic. Developed rash on her feet after starting medication but reports that the rash is going away. Pt states she is feeling better but still has fatigue and feels like it is still hard to "catch her breath".  Please advise.

## 2011-08-18 NOTE — Telephone Encounter (Signed)
Refills sent to [facility] 

## 2011-08-18 NOTE — Telephone Encounter (Signed)
[facility] to send 1 month supply with 3 refills.

## 2011-08-18 NOTE — Telephone Encounter (Signed)
Please advise re: minocycline refills.

## 2011-08-18 NOTE — Telephone Encounter (Signed)
Attempted to reach pt and left detailed message on cell# re: instructions below and to call first thing in the am to arrange appt.

## 2011-08-19 NOTE — Telephone Encounter (Signed)
Attempted to reach pt re: status. Left message to return my call in the morning.

## 2011-08-20 NOTE — Telephone Encounter (Signed)
Pt left voice message wanting to know if she still needed to schedule appt. States she is feeling better but is still tired and sometimes short of breath. Attempted to reach pt and left detailed message on work # to call in the morning to schedule f/u on Friday per Provider's instruction.

## 2011-08-22 ENCOUNTER — Ambulatory Visit (INDEPENDENT_AMBULATORY_CARE_PROVIDER_SITE_OTHER): Payer: Managed Care, Other (non HMO) | Admitting: Family

## 2011-08-22 ENCOUNTER — Encounter: Payer: Self-pay | Admitting: Family

## 2011-08-22 VITALS — BP 156/100 | HR 80 | Temp 97.9°F | Resp 18 | Ht 65.0 in | Wt 163.1 lb

## 2011-08-22 DIAGNOSIS — I10 Essential (primary) hypertension: Secondary | ICD-10-CM

## 2011-08-22 DIAGNOSIS — R0989 Other specified symptoms and signs involving the circulatory and respiratory systems: Secondary | ICD-10-CM

## 2011-08-22 DIAGNOSIS — R0600 Dyspnea, unspecified: Secondary | ICD-10-CM

## 2011-08-22 DIAGNOSIS — R0609 Other forms of dyspnea: Secondary | ICD-10-CM

## 2011-08-22 MED ORDER — METOPROLOL SUCCINATE ER 25 MG PO TB24: 25.0000 mg | ORAL_TABLET | Freq: Every day | ORAL | Status: DC

## 2011-08-22 MED ORDER — ALBUTEROL SULFATE HFA 108 (90 BASE) MCG/ACT IN AERS: 2.0000 | INHALATION_SPRAY | Freq: Four times a day (QID) | RESPIRATORY_TRACT | Status: DC | PRN

## 2011-08-22 NOTE — Patient Instructions (Addendum)
Please follow up in 2 weeks, sooner if symptoms worsen or do not improve.

## 2011-08-22 NOTE — Progress Notes (Signed)
Subjective:    Patient ID: Nicole Quinn, female    DOB: 1975/11/15, 36 y.o.   MRN: [id number]  HPI  Nicole Quinn is a 36 yr old female who presents today with chief complaint of shortness of breath.  This is not a new problem.  She had same complaints back in 2011 and underwent a LE duplex and 2-D echo which were WNL.  She reports that she had a a sore throat but this resolved. Reports + DOE.  Happens when she exercising as well.  Notes 2 week hx.  She denies chest pain.  Denies LE/calf pain/swelling.  Denies wheezing.  She denies hx of asthma    Review of Systems See HPI  Past Medical History  Diagnosis Date  . Anemia   . Allergy   . Hypertension   . History of UTI   . Endometriosis     History   Social History  . Marital Status: Single    Spouse Name: N/A    Number of Children: 2  . Years of Education: N/A   Occupational History  .      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 Smoking status: Never Smoker   . Smokeless tobacco: Never Used  . Alcohol Use: No  . Drug Use: Not on file  . Sexually Active: Not on file   Other Topics Concern  . Not on file   Social History Narrative   Regular exercise: yes    Past Surgical History  Procedure Date  . Abdominal hysterectomy 2003    partial    Family History  Problem Relation Age of Onset  . Arthritis Mother   . Hyperlipidemia Mother   . Hypertension Mother   . Hypertension Father   . Diabetes Father   . Allergies Daughter   . Allergies Son   . Arthritis Maternal Grandmother   . Heart disease Maternal Grandmother   . Stroke Maternal Grandmother   . Hypertension Maternal Grandmother   . Leukemia Paternal Grandmother     Allergies  Allergen Reactions  . Amoxicillin     rash  . Ace Inhibitors     REACTION: lip swelling    Current Outpatient Prescriptions on File Prior to Visit  Medication Sig Dispense Refill  . benzoyl peroxide-erythromycin (BENZAMYCIN) gel Apply topically  2 (two) times daily.  23.3 g  2  . hydrochlorothiazide (HYDRODIURIL) 25 MG tablet Take 1 tablet (25 mg total) by mouth daily.  30 tablet  2  . minocycline (MINOCIN,DYNACIN) 50 MG capsule TAKE ONE CAPSULE BY MOUTH TWICE DAILY  60 capsule  3  . Multiple Vitamin (MULTIVITAMIN) tablet Take 1 tablet by mouth daily.        . Norethindrone Acetate-Ethinyl Estradiol (JUNEL,LOESTRIN,MICROGESTIN) 1.5-30 MG-MCG tablet Take 1 tablet by mouth daily.  1 Package  11  . albuterol (PROVENTIL HFA;VENTOLIN HFA) 108 (90 BASE) MCG/ACT inhaler Inhale 2 puffs into the lungs every 6 (six) hours as needed for wheezing.  1 Inhaler  0  . metoprolol succinate (TOPROL-XL) 25 MG 24 hr tablet Take 1 tablet (25 mg total) by mouth daily.  30 tablet  0    BP 156/100  Pulse 80  Temp 97.9 F (36.6 C) (Oral)  Resp 18  Ht 5\' 5"  (1.651 m)  Wt 163 lb 1.3 oz (73.973 kg)  BMI 27.14 kg/m2  SpO2 95%       Objective:   Physical Exam  Constitutional: She appears well-developed and well-nourished. No  distress.  HENT:  Head: Normocephalic and atraumatic.  Cardiovascular: Normal rate and regular rhythm.   No murmur heard. Pulmonary/Chest: Effort normal and breath sounds normal. No respiratory distress. She has no wheezes. She has no rales. She exhibits no tenderness.  Musculoskeletal: She exhibits no edema.  Skin: Skin is warm and dry.  Psychiatric: She has a normal mood and affect. Her behavior is normal. Judgment and thought content normal.          Assessment & Plan:

## 2011-08-24 DIAGNOSIS — R0600 Dyspnea, unspecified: Secondary | ICD-10-CM | POA: Insufficient documentation

## 2011-08-24 NOTE — Assessment & Plan Note (Signed)
Add prn albuterol.  Consider PFT's if symptoms do not improve.

## 2011-08-24 NOTE — Assessment & Plan Note (Signed)
Deteriorated.  This may be contributing to her symptoms. Add toprol.

## 2011-08-25 ENCOUNTER — Telehealth: Payer: Self-pay | Admitting: *Deleted

## 2011-08-25 MED ORDER — FLUCONAZOLE 150 MG PO TABS: 150.0000 mg | ORAL_TABLET | Freq: Once | ORAL | Status: AC

## 2011-08-25 NOTE — Telephone Encounter (Signed)
Attempted to reach pt and left detailed message on cell# that rx has been completed and to call if any questions.

## 2011-08-25 NOTE — Telephone Encounter (Signed)
Received message from pt stating she is starting to develop a yeast infection from the antibiotic that she is on and is requesting Rx be sent to [facility] on [address], [place].  Please advise.

## 2011-08-25 NOTE — Telephone Encounter (Signed)
rx sent

## 2011-09-01 ENCOUNTER — Other Ambulatory Visit: Payer: Self-pay | Admitting: Family

## 2011-09-01 NOTE — Telephone Encounter (Signed)
Please advise re: diflucan refill.

## 2011-09-01 NOTE — Telephone Encounter (Signed)
At this point, I think she needs to be evaluated for Wet Prep please.

## 2011-09-02 NOTE — Telephone Encounter (Signed)
Left detailed message on pt's cell# to call the office to schedule appt for further evaluation and to let us know if she has any questions.

## 2011-09-03 ENCOUNTER — Telehealth: Payer: Self-pay | Admitting: Family

## 2011-09-03 ENCOUNTER — Encounter: Payer: Self-pay | Admitting: Family

## 2011-09-03 ENCOUNTER — Ambulatory Visit (INDEPENDENT_AMBULATORY_CARE_PROVIDER_SITE_OTHER): Payer: Managed Care, Other (non HMO) | Admitting: Family

## 2011-09-03 VITALS — BP 128/78 | HR 81 | Temp 98.1°F | Resp 16 | Wt 167.0 lb

## 2011-09-03 DIAGNOSIS — R0602 Shortness of breath: Secondary | ICD-10-CM

## 2011-09-03 DIAGNOSIS — R0609 Other forms of dyspnea: Secondary | ICD-10-CM

## 2011-09-03 DIAGNOSIS — R400 Somnolence: Secondary | ICD-10-CM

## 2011-09-03 DIAGNOSIS — R0600 Dyspnea, unspecified: Secondary | ICD-10-CM

## 2011-09-03 DIAGNOSIS — L293 Anogenital pruritus, unspecified: Secondary | ICD-10-CM

## 2011-09-03 DIAGNOSIS — N898 Other specified noninflammatory disorders of vagina: Secondary | ICD-10-CM

## 2011-09-03 DIAGNOSIS — R0989 Other specified symptoms and signs involving the circulatory and respiratory systems: Secondary | ICD-10-CM

## 2011-09-03 DIAGNOSIS — R404 Transient alteration of awareness: Secondary | ICD-10-CM

## 2011-09-03 MED ORDER — ALBUTEROL SULFATE (2.5 MG/3ML) 0.083% IN NEBU
2.5000 mg | INHALATION_SOLUTION | Freq: Once | RESPIRATORY_TRACT | Status: AC
  Administered 2011-09-03: 2.5 mg via RESPIRATORY_TRACT

## 2011-09-03 NOTE — Patient Instructions (Addendum)
You will be contact about your referral for home sleep study.  Please let us know if you have not heard back within 1 week about your referral. Follow up in 1 month.

## 2011-09-03 NOTE — Telephone Encounter (Signed)
Refill denied, see 8/13 refill request.

## 2011-09-03 NOTE — Progress Notes (Signed)
Subjective:    Patient ID: Nicole Quinn, female    DOB: 1975-04-06, 36 y.o.   MRN: [id number]  HPI  Nicole Quinn is a 36 yr old female who presents today with chief complaint of vaginal itching.  She also reports SOB with exertion, not seeming to be helped by albuterol.   Vaginal itching, clear discharge x  Week.  No odor.  Has not tried any OTC meds.    Asthma- reports that she has been using her inhaler but still has some DOE.    Review of Systems See HPI  Past Medical History  Diagnosis Date  . Anemia   . Allergy   . Hypertension   . History of UTI   . Endometriosis     History   Social History  . Marital Status: Single    Spouse Name: N/A    Number of Children: 2  . Years of Education: N/A   Occupational History  .      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 Smoking status: Never Smoker   . Smokeless tobacco: Never Used  . Alcohol Use: No  . Drug Use: Not on file  . Sexually Active: Not on file   Other Topics Concern  . Not on file   Social History Narrative   Regular exercise: yes    Past Surgical History  Procedure Date  . Abdominal hysterectomy 2003    partial    Family History  Problem Relation Age of Onset  . Arthritis Mother   . Hyperlipidemia Mother   . Hypertension Mother   . Hypertension Father   . Diabetes Father   . Allergies Daughter   . Allergies Son   . Arthritis Maternal Grandmother   . Heart disease Maternal Grandmother   . Stroke Maternal Grandmother   . Hypertension Maternal Grandmother   . Leukemia Paternal Grandmother     Allergies  Allergen Reactions  . Amoxicillin     rash  . Ace Inhibitors     REACTION: lip swelling    Current Outpatient Prescriptions on File Prior to Visit  Medication Sig Dispense Refill  . albuterol (PROVENTIL HFA;VENTOLIN HFA) 108 (90 BASE) MCG/ACT inhaler Inhale 2 puffs into the lungs every 6 (six) hours as needed for wheezing.  1 Inhaler  0  . benzoyl  peroxide-erythromycin (BENZAMYCIN) gel Apply topically 2 (two) times daily.  23.3 g  2  . hydrochlorothiazide (HYDRODIURIL) 25 MG tablet Take 1 tablet (25 mg total) by mouth daily.  30 tablet  2  . metoprolol succinate (TOPROL-XL) 25 MG 24 hr tablet Take 1 tablet (25 mg total) by mouth daily.  30 tablet  0  . minocycline (MINOCIN,DYNACIN) 50 MG capsule TAKE ONE CAPSULE BY MOUTH TWICE DAILY  60 capsule  3  . Multiple Vitamin (MULTIVITAMIN) tablet Take 1 tablet by mouth daily.        . Norethindrone Acetate-Ethinyl Estradiol (JUNEL,LOESTRIN,MICROGESTIN) 1.5-30 MG-MCG tablet Take 1 tablet by mouth daily.  1 Package  11    BP 128/78  Pulse 81  Temp 98.1 F (36.7 C) (Oral)  Resp 16  Wt 167 lb 0.6 oz (75.769 kg)  SpO2 99%       Objective:   Physical Exam  Constitutional: She appears well-developed and well-nourished. No distress.  Cardiovascular: Normal rate and regular rhythm.   No murmur heard. Pulmonary/Chest: Effort normal and breath sounds normal. No respiratory distress. She has no wheezes. She has no rales. She exhibits  no tenderness.  Genitourinary:       Wet prep performed.  Psychiatric: She has a normal mood and affect. Her behavior is normal. Judgment and thought content normal.          Assessment & Plan:

## 2011-09-03 NOTE — Telephone Encounter (Signed)
Refill-fluconazole 150mg  tab. Take one tablet by mouth once. Qty 1 last fill 8.5.13

## 2011-09-04 DIAGNOSIS — N898 Other specified noninflammatory disorders of vagina: Secondary | ICD-10-CM | POA: Insufficient documentation

## 2011-09-04 LAB — WET PREP BY MOLECULAR PROBE
Candida species: NEGATIVE
Gardnerella vaginalis: NEGATIVE
Trichomonas vaginosis: NEGATIVE

## 2011-09-04 NOTE — Assessment & Plan Note (Signed)
Wet prep is negative.

## 2011-09-04 NOTE — Assessment & Plan Note (Signed)
This is a long standing problem which has been ongoing since 2011.  She has had a 2-D echo, CTA chest negative for PE. PFT's are performed today and look good pre-bronchodilator, with no significant improvement after bronchodilator. She notes significant daytime somnolence. Will refer for home sleep study to rule out OSA.

## 2011-09-16 ENCOUNTER — Telehealth: Payer: Self-pay | Admitting: *Deleted

## 2011-09-16 MED ORDER — FUROSEMIDE 20 MG PO TABS: 20.0000 mg | ORAL_TABLET | Freq: Every day | ORAL | Status: DC

## 2011-09-16 NOTE — Telephone Encounter (Signed)
Left detailed message on pt's cell # and to call and schedule appt in 1 week.

## 2011-09-16 NOTE — Telephone Encounter (Signed)
Pt called stating she has not heard from anyone re: home sleep study but does not think she needs to proceed with testing. Pt reports recent swelling in face, hands, feet and legs. Reports weight at home of 171, last wt on 8/14 was 167. Pt has noted that her shortness of breath has increased with the recent swelling. Please advise re: sleep study and swelling.

## 2011-09-16 NOTE — Telephone Encounter (Signed)
Pt should start furosemide 20mg  once daily in addition to current meds for swelling.  Call if sob/swelling worsens or does not improve.  Follow up in office in 1 week and we can evaluate her and discuss sleep study at that time.

## 2011-09-17 ENCOUNTER — Telehealth: Payer: Self-pay | Admitting: *Deleted

## 2011-09-17 NOTE — Telephone Encounter (Signed)
Attempted to reach pt and left detailed message on pt's cell # to call and schedule appt for this Friday afternoon.

## 2011-09-17 NOTE — Telephone Encounter (Signed)
Patient returned phone call and scheduled appointment for Friday afternoon

## 2011-09-17 NOTE — Telephone Encounter (Signed)
Received call from pt stating she started furosemide and has lost 8 lbs. Current weight at home is 153. Pt has scheduled f/u for 09/24/11 and reports that her SOB is only slightly improved. Advised pt to call if symptoms worsen.

## 2011-09-17 NOTE — Telephone Encounter (Signed)
I think she needs to be seen sooner in office please.

## 2011-09-18 ENCOUNTER — Telehealth: Payer: Self-pay | Admitting: *Deleted

## 2011-09-18 NOTE — Telephone Encounter (Signed)
Received message from [facility] at [facility] requesting status of order faxed to [place] on 09/08/11. After review of chart, do not see order scanned. Left detailed message on voicemail for order to be refaxed and to call if any questions.

## 2011-09-19 ENCOUNTER — Encounter: Payer: Self-pay | Admitting: Family

## 2011-09-19 ENCOUNTER — Ambulatory Visit (INDEPENDENT_AMBULATORY_CARE_PROVIDER_SITE_OTHER): Payer: Managed Care, Other (non HMO) | Admitting: Family

## 2011-09-19 VITALS — BP 122/82 | HR 76 | Temp 98.2°F | Resp 16 | Wt 165.0 lb

## 2011-09-19 DIAGNOSIS — R609 Edema, unspecified: Secondary | ICD-10-CM

## 2011-09-19 DIAGNOSIS — R0602 Shortness of breath: Secondary | ICD-10-CM

## 2011-09-19 NOTE — Assessment & Plan Note (Addendum)
EKG is performed today and notes TWI in leads V2 and V4 which is new compared to old EKG 04/10/09.  Etiology remains unclear to me.  I will refer to cardiology for further evaluation.  If cardiology work up is negative, then we discussed further treatment for anxiety.

## 2011-09-19 NOTE — Patient Instructions (Addendum)
Please complete your blood work prior to leaving.  You will be contact about your referral tp cardiology.Please let us know if you have not heard back within 1 week about your referral. Follow up in 6 weeks.

## 2011-09-19 NOTE — Progress Notes (Signed)
Subjective:    Patient ID: Nicole Quinn, female    DOB: [birth year], 36 y.o.   MRN: [id number]  HPI  [name] is a 36 yr old female who presents today for follow up of her LE edema and shortness of breath.  SOB- Patient has been complaining of shortness of breath intermittently since 2011.  She has had a work up which included a negative CTA chest in march 2011, a 2-D echo 02/2010 which was negative except note of mild LVH, and most recently, normal spirometry.  She reports that shortness of breath can happen at rest but is worse with exercise.  Denies associated chest pain.  Occasional wheezing with exercise. Feels like the shortness of breath is worst while at work.    Edema-  Reports that she lost 8 pounds in 24 hrs on furosemide.  No improvement in the breathing despite the improvement in her LE edema.  She had a bilateral LE doppler 3/11 for same complaint which was negative for DVT.   Review of Systems See HPI  Past Medical History  Diagnosis Date  . Anemia   . Allergy   . Hypertension   . History of UTI   . Endometriosis     History   Social History  . Marital Status: Single    Spouse Name: N/A    Number of Children: 2  . Years of Education: N/A   Occupational History  .      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 Smoking status: Never Smoker   . Smokeless tobacco: Never Used  . Alcohol Use: No  . Drug Use: Not on file  . Sexually Active: Not on file   Other Topics Concern  . Not on file   Social History Narrative   Regular exercise: yes    Past Surgical History  Procedure Date  . Abdominal hysterectomy 2003    partial    Family History  Problem Relation Age of Onset  . Arthritis Mother   . Hyperlipidemia Mother   . Hypertension Mother   . Hypertension Father   . Diabetes Father   . Allergies Daughter   . Allergies Son   . Arthritis Maternal Grandmother   . Heart disease Maternal Grandmother   . Stroke Maternal  Grandmother   . Hypertension Maternal Grandmother   . Leukemia Paternal Grandmother     Allergies  Allergen Reactions  . Amoxicillin     rash  . Ace Inhibitors     REACTION: lip swelling    Current Outpatient Prescriptions on File Prior to Visit  Medication Sig Dispense Refill  . albuterol (PROVENTIL HFA;VENTOLIN HFA) 108 (90 BASE) MCG/ACT inhaler Inhale 2 puffs into the lungs every 6 (six) hours as needed for wheezing.  1 Inhaler  0  . benzoyl peroxide-erythromycin (BENZAMYCIN) gel Apply topically 2 (two) times daily.  23.3 g  2  . furosemide (LASIX) 20 MG tablet Take 1 tablet (20 mg total) by mouth daily.  30 tablet  0  . hydrochlorothiazide (HYDRODIURIL) 25 MG tablet Take 1 tablet (25 mg total) by mouth daily.  30 tablet  2  . metoprolol succinate (TOPROL-XL) 25 MG 24 hr tablet Take 1 tablet (25 mg total) by mouth daily.  30 tablet  0  . minocycline (MINOCIN,DYNACIN) 50 MG capsule TAKE ONE CAPSULE BY MOUTH TWICE DAILY  60 capsule  3  . Multiple Vitamin (MULTIVITAMIN) tablet Take 1 tablet by mouth daily.        Marland Kitchen  Norethindrone Acetate-Ethinyl Estradiol (JUNEL,LOESTRIN,MICROGESTIN) 1.5-30 MG-MCG tablet Take 1 tablet by mouth daily.  1 Package  11    BP 122/82  Pulse 76  Temp 98.2 F (36.8 C) (Oral)  Resp 16  Wt 165 lb 0.6 oz (74.862 kg)  SpO2 97%       Objective:   Physical Exam  Constitutional: She appears well-developed and well-nourished. No distress.  Cardiovascular: Normal rate and regular rhythm.   No murmur heard. Pulmonary/Chest: Effort normal and breath sounds normal. No respiratory distress. She has no wheezes. She has no rales. She exhibits no tenderness.  Musculoskeletal:       Trace bilateral LE edema.            Assessment & Plan:

## 2011-09-20 LAB — BASIC METABOLIC PANEL
BUN: 19 mg/dL (ref 6–23)
CO2: 27 mEq/L (ref 19–32)
Calcium: 9.2 mg/dL (ref 8.4–10.5)
Chloride: 97 mEq/L (ref 96–112)
Creat: 1 mg/dL (ref 0.50–1.10)
Glucose, Bld: 80 mg/dL (ref 70–99)
Potassium: 3.2 mEq/L — ABNORMAL LOW (ref 3.5–5.3)
Sodium: 136 mEq/L (ref 135–145)

## 2011-09-23 ENCOUNTER — Other Ambulatory Visit: Payer: Self-pay | Admitting: Family

## 2011-09-23 ENCOUNTER — Encounter: Payer: Self-pay | Admitting: Cardiology

## 2011-09-23 ENCOUNTER — Ambulatory Visit (INDEPENDENT_AMBULATORY_CARE_PROVIDER_SITE_OTHER): Payer: Managed Care, Other (non HMO) | Admitting: Cardiology

## 2011-09-23 VITALS — BP 148/87 | HR 75 | Ht 63.0 in | Wt 167.0 lb

## 2011-09-23 DIAGNOSIS — E876 Hypokalemia: Secondary | ICD-10-CM

## 2011-09-23 DIAGNOSIS — R0602 Shortness of breath: Secondary | ICD-10-CM

## 2011-09-23 MED ORDER — FUROSEMIDE 20 MG PO TABS: 20.0000 mg | ORAL_TABLET | Freq: Every day | ORAL | Status: DC

## 2011-09-23 MED ORDER — POTASSIUM CHLORIDE CRYS ER 20 MEQ PO TBCR: 20.0000 meq | EXTENDED_RELEASE_TABLET | Freq: Every day | ORAL | Status: DC

## 2011-09-23 NOTE — Telephone Encounter (Signed)
LMOM with contact name & number for return call form patient RE: lab results & further physician instructions; f/u lab order placed in EMR [new Rx sent to pharmacy by Melissa O'Sullivan]/SLS

## 2011-09-23 NOTE — Telephone Encounter (Signed)
Pls call pt and let her know that her potassium is low.  I would like for her to take KDur once daily on the days that she takes lasix.   Follow up bmet in 2 weeks (hypokalemia).

## 2011-09-23 NOTE — Patient Instructions (Addendum)
The current medical regimen is effective;  continue present plan and medications.  Please have blood work (BMP and TSH) with results faxed to [phone]  Your physician has recommended that you have a cardiopulmonary stress test (CPX). CPX testing is a non-invasive measurement of heart and lung function. It replaces a traditional treadmill stress test. This type of test provides a tremendous amount of information that relates not only to your present condition but also for future outcomes. This test combines measurements of you ventilation, respiratory gas exchange in the lungs, electrocardiogram (EKG), blood pressure and physical response before, during, and following an exercise protocol.  Follow up will be based on results of testing

## 2011-09-23 NOTE — Telephone Encounter (Signed)
Patient informed, understood & agreed/SLS  

## 2011-09-23 NOTE — Progress Notes (Signed)
HPI Patient presents for evaluation of dyspnea. She has no prior cardiac. I did review a CT of her chest done in 2011 to rule out pulmonary emboli. This was negative.  She has had an echocardiogram in February 2012 that demonstrated mild LVH with normal systolic function there were no significant valvular abnormalities.  She reports that she's been getting progressively more dyspneic. She thinks she gets short of breath on level ground walking 20 feet. She's not describing PND or orthopnea. This seems to be worse over 2-3 months. She does do exercises routinely such as kick boxing but this is becoming more difficult. She has to take more frequent breaks during the sessions. She does get few palpitations but no sustained dysrhythmias. He doesn't describe presyncope or syncope. She gets occasional tingling in her left hand with activity but no arm discomfort. She gets no chest or neck discomfort. She does have high blood pressure and this is actively being managed. Her weights have been stable recently with a 40 lb weight loss since April of last year.   Allergies  Allergen Reactions  . Amoxicillin     rash  . Ace Inhibitors     REACTION: lip swelling    Current Outpatient Prescriptions  Medication Sig Dispense Refill  . albuterol (PROVENTIL HFA;VENTOLIN HFA) 108 (90 BASE) MCG/ACT inhaler Inhale 2 puffs into the lungs every 6 (six) hours as needed for wheezing.  1 Inhaler  0  . benzoyl peroxide-erythromycin (BENZAMYCIN) gel Apply topically 2 (two) times daily.  23.3 g  2  . furosemide (LASIX) 20 MG tablet Take 1 tablet (20 mg total) by mouth daily.  30 tablet  1  . hydrochlorothiazide (HYDRODIURIL) 25 MG tablet TAKE ONE TABLET BY MOUTH EVERY DAY  30 tablet  2  . metoprolol succinate (TOPROL-XL) 25 MG 24 hr tablet TAKE ONE TABLET BY MOUTH EVERY DAY  30 tablet  2  . minocycline (MINOCIN,DYNACIN) 50 MG capsule TAKE ONE CAPSULE BY MOUTH TWICE DAILY  60 capsule  3  . Multiple Vitamin (MULTIVITAMIN)  tablet Take 1 tablet by mouth daily.        . Norethindrone Acetate-Ethinyl Estradiol (JUNEL,LOESTRIN,MICROGESTIN) 1.5-30 MG-MCG tablet Take 1 tablet by mouth daily.  1 Package  11  . potassium chloride SA (K-DUR,KLOR-CON) 20 MEQ tablet Take 1 tablet (20 mEq total) by mouth daily.  30 tablet  0  . DISCONTD: furosemide (LASIX) 20 MG tablet Take 1 tablet (20 mg total) by mouth daily.  30 tablet  0  . DISCONTD: hydrochlorothiazide (HYDRODIURIL) 25 MG tablet Take 1 tablet (25 mg total) by mouth daily.  30 tablet  2  . DISCONTD: metoprolol succinate (TOPROL-XL) 25 MG 24 hr tablet Take 1 tablet (25 mg total) by mouth daily.  30 tablet  0    Past Medical History  Diagnosis Date  . Anemia   . Allergy   . Hypertension   . History of UTI   . Endometriosis     Past Surgical History  Procedure Date  . Abdominal hysterectomy 2003    partial    Family History  Problem Relation Age of Onset  . Arthritis Mother   . Hyperlipidemia Mother   . Hypertension Mother   . Hypertension Father   . Diabetes Father   . Allergies Daughter   . Allergies Son   . Arthritis Maternal Grandmother   . Heart disease Maternal Grandmother   . Stroke Maternal Grandmother   . Hypertension Maternal Grandmother   . Leukemia  Paternal Grandmother     History   Social History  . Marital Status: Single    Spouse Name: N/A    Number of Children: 2  . Years of Education: N/A   Occupational History  .      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 Smoking status: Never Smoker   . Smokeless tobacco: Never Used  . Alcohol Use: No  . Drug Use: Not on file  . Sexually Active: Not on file   Other Topics Concern  . Not on file   Social History Narrative   Regular exercise: yes    ROS:   Positive for joint swelling and asthma.   Otherwise as stated in the HPI and negative for all other systems.   PHYSICAL EXAM BP 148/87  Pulse 75  Ht 5\' 3"  (1.6 m)  Wt 167 lb (75.751 kg)  BMI  29.58 kg/m2 GENERAL:  Well appearing HEENT:  Pupils equal round and reactive, fundi not visualized, oral mucosa unremarkable NECK:  No jugular venous distention, waveform within normal limits, carotid upstroke brisk and symmetric, no bruits, no thyromegaly LYMPHATICS:  No cervical, inguinal adenopathy LUNGS:  Clear to auscultation bilaterally BACK:  No CVA tenderness CHEST:  Unremarkable HEART:  PMI not displaced or sustained,S1 and S2 within normal limits, no S3, no S4, no clicks, no rubs, no murmurs ABD:  Flat, positive bowel sounds normal in frequency in pitch, no bruits, no rebound, no guarding, no midline pulsatile mass, no hepatomegaly, no splenomegaly EXT:  2 plus pulses throughout, no edema, no cyanosis no clubbing SKIN:  No rashes no nodules NEURO:  Cranial nerves II through XII grossly intact, motor grossly intact throughout PSYCH:  Cognitively intact, oriented to person place and time   EKG:   Sinus rhythm, rate 69, axis within normal limits, intervals within normal limits, anterolateral T-wave inversions cannot exclude ischemia.  This was new compared to 2011.. 09/23/2011   ASSESSMENT AND PLAN   Dyspnea - The etiology of this is not entirely clear.  At this point I think cardiopulmonary stress testing would be the most reasonable starting point. I will also check a BNP and TSH. Further evaluation will be based on these results.  I will likely see her back after this with the abnormal EKG and significance of the symptoms.  HTN - Her blood pressure seems to fluctuate somewhat but is currently well controlled. We did discuss therapeutic lifestyle changes to include salt restriction. I will defer management of this to O'SULLIVAN,MELISSA S., NP  Weight - She has lost 55 pounds with exercise and diet. I congratulated her on this remarkable achievement.

## 2011-09-24 ENCOUNTER — Other Ambulatory Visit: Payer: Self-pay | Admitting: Cardiology

## 2011-09-24 ENCOUNTER — Ambulatory Visit: Payer: Managed Care, Other (non HMO) | Admitting: Family

## 2011-09-24 LAB — BASIC METABOLIC PANEL
BUN: 12 mg/dL (ref 6–23)
CO2: 27 mEq/L (ref 19–32)
Calcium: 9.5 mg/dL (ref 8.4–10.5)
Chloride: 101 mEq/L (ref 96–112)
Creat: 0.92 mg/dL (ref 0.50–1.10)
Glucose, Bld: 79 mg/dL (ref 70–99)
Potassium: 3.9 mEq/L (ref 3.5–5.3)
Sodium: 140 mEq/L (ref 135–145)

## 2011-09-24 LAB — TSH: TSH: 1.027 u[IU]/mL (ref 0.350–4.500)

## 2011-09-24 NOTE — Telephone Encounter (Signed)
Spoke with pt on 09/17/11 and pt voiced desire to hold off on home sleep study due to other health workup at present. Ok per verbal from Provider to hold off sleep study for now. Attempted to reach [facility] and received fast busy signal. Will try again later.

## 2011-09-25 NOTE — Telephone Encounter (Signed)
Left message with after hours service that we are postponing home sleep study at this time. They will forward message.

## 2011-09-26 NOTE — Telephone Encounter (Signed)
Spoke with Melissa at [facility] and notified her of cancellation of home sleep study at present.

## 2011-09-30 ENCOUNTER — Telehealth: Payer: Self-pay | Admitting: *Deleted

## 2011-09-30 NOTE — Telephone Encounter (Signed)
Pt send a email request for results of lab work done last week.  Pt made aware that blood work was completely normal.  She had no further questions.

## 2011-10-01 ENCOUNTER — Ambulatory Visit (HOSPITAL_COMMUNITY): Payer: Managed Care, Other (non HMO) | Attending: Cardiology

## 2011-10-01 ENCOUNTER — Other Ambulatory Visit: Payer: Self-pay | Admitting: Family

## 2011-10-01 DIAGNOSIS — R0602 Shortness of breath: Secondary | ICD-10-CM | POA: Insufficient documentation

## 2011-10-02 NOTE — Telephone Encounter (Signed)
Please advise re: metronidazole request.

## 2011-10-02 NOTE — Telephone Encounter (Signed)
Refill sent, pt notified

## 2011-10-02 NOTE — Telephone Encounter (Signed)
Ok, call if symptoms do not improve with flagyl.

## 2011-10-03 ENCOUNTER — Telehealth: Payer: Self-pay | Admitting: *Deleted

## 2011-10-03 NOTE — Telephone Encounter (Signed)
Pt left message requesting to be written out of work due tot he stress she is currently experiencing.  Attempted to reach pt for further details and left message on cell# to return my call.

## 2011-10-08 NOTE — Telephone Encounter (Signed)
I would like to see her back in the office please. Go to ER if she develops suicide ideations.

## 2011-10-08 NOTE — Telephone Encounter (Signed)
Spoke with pt, she reports various moods. Under a lot of stress at work. Feels angry, impatient and depressed. Reports crying for no reason, feels overwhelmed a lot. Pt states her heart is still racing. She reports that she thinks she does not need to take time off from work if she could take something for her anxiety.  Please advise.

## 2011-10-08 NOTE — Telephone Encounter (Signed)
Pt left voice message today that she would like something called in for anxiety. Attempted to reach pt and left message to return my call.

## 2011-10-08 NOTE — Telephone Encounter (Signed)
Notified pt and she voices understanding. Pt scheduled f/u for 10/10/11 @ 8:15am.

## 2011-10-10 ENCOUNTER — Encounter: Payer: Self-pay | Admitting: Family

## 2011-10-10 ENCOUNTER — Ambulatory Visit (INDEPENDENT_AMBULATORY_CARE_PROVIDER_SITE_OTHER): Payer: Managed Care, Other (non HMO) | Admitting: Family

## 2011-10-10 VITALS — BP 124/84 | HR 77 | Temp 98.4°F | Resp 16 | Ht 65.0 in | Wt 173.0 lb

## 2011-10-10 DIAGNOSIS — Z23 Encounter for immunization: Secondary | ICD-10-CM

## 2011-10-10 DIAGNOSIS — F341 Dysthymic disorder: Secondary | ICD-10-CM

## 2011-10-10 DIAGNOSIS — F32A Depression, unspecified: Secondary | ICD-10-CM | POA: Insufficient documentation

## 2011-10-10 DIAGNOSIS — F418 Other specified anxiety disorders: Secondary | ICD-10-CM

## 2011-10-10 MED ORDER — SERTRALINE HCL 50 MG PO TABS: 50.0000 mg | ORAL_TABLET | Freq: Every day | ORAL | Status: DC

## 2011-10-10 NOTE — Progress Notes (Signed)
Subjective:    Patient ID: Nicole Quinn, female    DOB: [birth year], 36 y.o.   MRN: [id number]  HPI  Nicole Quinn is a 36 yr old female who presents today with chief complaint of anxiety. She reports that recently she has felt very irritated, cries easily and has felt a lot of anxiety. Reports that things are going well at home, but work has been stressful due to increased work demands. She denies suicidal ideation.  BV- treated with metronidazole. Reports that symptoms are resolved.     Review of Systems See HPI  Past Medical History  Diagnosis Date  . Anemia   . Allergy   . Hypertension   . History of UTI   . Endometriosis     History   Social History  . Marital Status: Single    Spouse Name: N/A    Number of Children: 2  . Years of Education: N/A   Occupational History  .      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 Smoking status: Never Smoker   . Smokeless tobacco: Never Used  . Alcohol Use: No  . Drug Use: Not on file  . Sexually Active: Not on file   Other Topics Concern  . Not on file   Social History Narrative   Regular exercise: yes.   Lives with one child.  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      Past Surgical History  Procedure Date  . Abdominal hysterectomy 2003    partial  . Carpal tunnel release     right  . Ganglion cyst excision     right    Family History  Problem Relation Age of Onset  . Arthritis Mother   . Hyperlipidemia Mother   . Hypertension Mother   . Hypertension Father   . Diabetes Father   . Allergies Daughter   . Allergies Son   . Arthritis Maternal Grandmother   . Stroke Maternal Grandmother 50  . Hypertension Maternal Grandmother   . Leukemia Paternal Grandmother   . Coronary artery disease Maternal Aunt 50  . Coronary artery disease Maternal Aunt 50    Allergies  Allergen Reactions  . Amoxicillin     rash  . Ace Inhibitors     REACTION: lip swelling    Current Outpatient  Prescriptions on File Prior to Visit  Medication Sig Dispense Refill  . albuterol (PROVENTIL HFA;VENTOLIN HFA) 108 (90 BASE) MCG/ACT inhaler Inhale 2 puffs into the lungs every 6 (six) hours as needed for wheezing.  1 Inhaler  0  . benzoyl peroxide-erythromycin (BENZAMYCIN) gel Apply topically 2 (two) times daily.  23.3 g  2  . furosemide (LASIX) 20 MG tablet Take 1 tablet (20 mg total) by mouth daily.  30 tablet  1  . hydrochlorothiazide (HYDRODIURIL) 25 MG tablet TAKE ONE TABLET BY MOUTH EVERY DAY  30 tablet  2  . metoprolol succinate (TOPROL-XL) 25 MG 24 hr tablet TAKE ONE TABLET BY MOUTH EVERY DAY  30 tablet  2  . metroNIDAZOLE (FLAGYL) 500 MG tablet TAKE ONE TABLET BY MOUTH TWICE DAILY  14 tablet  0  . minocycline (MINOCIN,DYNACIN) 50 MG capsule TAKE ONE CAPSULE BY MOUTH TWICE DAILY  60 capsule  3  . Multiple Vitamin (MULTIVITAMIN) tablet Take 1 tablet by mouth daily.        . Norethindrone Acetate-Ethinyl Estradiol (JUNEL,LOESTRIN,MICROGESTIN) 1.5-30 MG-MCG tablet Take 1 tablet by mouth daily.  1 Package  11  . potassium chloride SA (K-DUR,KLOR-CON) 20 MEQ tablet Take 1 tablet (20 mEq total) by mouth daily.  30 tablet  0  . sertraline (ZOLOFT) 50 MG tablet Take 1 tablet (50 mg total) by mouth daily.  30 tablet  0    BP 124/84  Pulse 77  Temp 98.4 F (36.9 C) (Oral)  Resp 16  Ht 5\' 5"  (1.651 m)  Wt 173 lb 0.6 oz (78.49 kg)  BMI 28.80 kg/m2  SpO2 94%       Objective:   Physical Exam  Constitutional: She appears well-developed and well-nourished. No distress.  Psychiatric: Her behavior is normal. Judgment and thought content normal.       Tearful but pleasant.          Assessment & Plan:

## 2011-10-10 NOTE — Assessment & Plan Note (Signed)
Will initiated zoloft.  I instructed pt to start 1/2 tablet once daily for 1 week and then increase to a full tablet once daily on week two as tolerated.  We discussed common side effects such as nausea, drowsiness and weight gain.  Pt verbalizes understanding.  Plan follow up in 1 month to evaluate progress.

## 2011-10-10 NOTE — Patient Instructions (Addendum)
Sertraline- start 1/2 tablet once daily for 1 week and then increase to a full tablet once daily on week two as tolerated.  Please schedule a follow up appointment in 1 month.

## 2011-10-10 NOTE — Assessment & Plan Note (Signed)
>>  ASSESSMENT AND PLAN FOR ANXIETY ASSOCIATED WITH DEPRESSION WRITTEN ON 10/10/2011  8:35 AM BY O'SULLIVAN, Zebastian Carico, NP  Will initiated zoloft.  I instructed pt to start 1/2 tablet once daily for 1 week and then increase to a full tablet once daily on week two as tolerated.  We discussed common side effects such as nausea, drowsiness and weight gain.  Pt verbalizes understanding.  Plan follow up in 1 month to evaluate progress.

## 2011-10-21 ENCOUNTER — Telehealth: Payer: Self-pay | Admitting: Family

## 2011-10-21 DIAGNOSIS — R400 Somnolence: Secondary | ICD-10-CM

## 2011-10-21 NOTE — Telephone Encounter (Signed)
Please call pt and let her know that her insurance will not cover home sleep study, but I am told that they will cover an overnight study.  Order pended below.

## 2011-10-21 NOTE — Telephone Encounter (Signed)
Left detailed message and requested pt call with decision.

## 2011-10-22 NOTE — Telephone Encounter (Signed)
Pt left message that she is willing to proceed with overnight study. Order signed.

## 2011-10-24 ENCOUNTER — Telehealth: Payer: Self-pay | Admitting: Cardiology

## 2011-10-24 NOTE — Telephone Encounter (Signed)
Reviewed results of cpx with pt and scheduled f/u appt as ordered.

## 2011-10-24 NOTE — Telephone Encounter (Signed)
Pt rtn your call and you can reach her at the same number

## 2011-10-24 NOTE — Telephone Encounter (Signed)
New Problem:    Patient called returning your call.  Please call back. 

## 2011-11-05 ENCOUNTER — Encounter: Payer: Self-pay | Admitting: Family

## 2011-11-05 ENCOUNTER — Ambulatory Visit (INDEPENDENT_AMBULATORY_CARE_PROVIDER_SITE_OTHER): Payer: Managed Care, Other (non HMO) | Admitting: Family

## 2011-11-05 ENCOUNTER — Telehealth: Payer: Self-pay | Admitting: Family

## 2011-11-05 VITALS — BP 134/94 | HR 85 | Temp 98.0°F | Resp 12 | Ht 65.0 in | Wt 173.0 lb

## 2011-11-05 DIAGNOSIS — F418 Other specified anxiety disorders: Secondary | ICD-10-CM

## 2011-11-05 DIAGNOSIS — F341 Dysthymic disorder: Secondary | ICD-10-CM

## 2011-11-05 MED ORDER — SERTRALINE HCL 100 MG PO TABS: 100.0000 mg | ORAL_TABLET | Freq: Every day | ORAL | Status: DC

## 2011-11-05 NOTE — Assessment & Plan Note (Signed)
>>  ASSESSMENT AND PLAN FOR ANXIETY ASSOCIATED WITH DEPRESSION WRITTEN ON 11/05/2011  2:45 PM BY O'SULLIVAN, Haneef Hallquist, NP  Unchanged.  Will increase zoloft to 100mg .  She pays out of pockets for meds so cost is a concern.  Consider adding another generic next visit if no improvement.

## 2011-11-05 NOTE — Patient Instructions (Addendum)
Please follow up in 1 month.  

## 2011-11-05 NOTE — Progress Notes (Signed)
Subjective:    Patient ID: Nicole Quinn, female    DOB: 1975/05/09, 36 y.o.   MRN: [id number]  HPI  Patient presents today for her 1 month follow up appointment for anxiety/depression.  Last visit she was started on sertraline.  Still not sleeping well.  Has trouble falling asleep. Feels like mind races.  Only feeling slightly better since starting sertraline. Doesn't feel like she has support at home. Daughter recently went off to college.  Feels like all she wants to do on the weekends is sleep. Denis suicide ideation.  Review of Systems See HPI  Past Medical History  Diagnosis Date  . Anemia   . Allergy   . Hypertension   . History of UTI   . Endometriosis     History   Social History  . Marital Status: Single    Spouse Name: N/A    Number of Children: 2  . Years of Education: N/A   Occupational History  .      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 Smoking status: Never Smoker   . Smokeless tobacco: Never Used  . Alcohol Use: No  . Drug Use: Not on file  . Sexually Active: Not on file   Other Topics Concern  . Not on file   Social History Narrative   Regular exercise: yes.   Lives with one child.  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      Past Surgical History  Procedure Date  . Abdominal hysterectomy 2003    partial  . Carpal tunnel release     right  . Ganglion cyst excision     right    Family History  Problem Relation Age of Onset  . Arthritis Mother   . Hyperlipidemia Mother   . Hypertension Mother   . Hypertension Father   . Diabetes Father   . Allergies Daughter   . Allergies Son   . Arthritis Maternal Grandmother   . Stroke Maternal Grandmother 50  . Hypertension Maternal Grandmother   . Leukemia Paternal Grandmother   . Coronary artery disease Maternal Aunt 50  . Coronary artery disease Maternal Aunt 50    Allergies  Allergen Reactions  . Amoxicillin     rash  . Ace Inhibitors     REACTION:  lip swelling    Current Outpatient Prescriptions on File Prior to Visit  Medication Sig Dispense Refill  . albuterol (PROVENTIL HFA;VENTOLIN HFA) 108 (90 BASE) MCG/ACT inhaler Inhale 2 puffs into the lungs every 6 (six) hours as needed for wheezing.  1 Inhaler  0  . benzoyl peroxide-erythromycin (BENZAMYCIN) gel Apply topically 2 (two) times daily.  23.3 g  2  . furosemide (LASIX) 20 MG tablet Take 1 tablet (20 mg total) by mouth daily.  30 tablet  1  . hydrochlorothiazide (HYDRODIURIL) 25 MG tablet TAKE ONE TABLET BY MOUTH EVERY DAY  30 tablet  2  . metoprolol succinate (TOPROL-XL) 25 MG 24 hr tablet TAKE ONE TABLET BY MOUTH EVERY DAY  30 tablet  2  . minocycline (MINOCIN,DYNACIN) 50 MG capsule TAKE ONE CAPSULE BY MOUTH TWICE DAILY  60 capsule  3  . Multiple Vitamin (MULTIVITAMIN) tablet Take 1 tablet by mouth daily.        . Norethindrone Acetate-Ethinyl Estradiol (JUNEL,LOESTRIN,MICROGESTIN) 1.5-30 MG-MCG tablet Take 1 tablet by mouth daily.  1 Package  11  . potassium chloride SA (K-DUR,KLOR-CON) 20 MEQ tablet Take 1 tablet (20 mEq total) by  mouth daily.  30 tablet  0  . DISCONTD: sertraline (ZOLOFT) 50 MG tablet Take 1 tablet (50 mg total) by mouth daily.  30 tablet  0  . metroNIDAZOLE (FLAGYL) 500 MG tablet TAKE ONE TABLET BY MOUTH TWICE DAILY  14 tablet  0    BP 134/94  Pulse 85  Temp 98 F (36.7 C) (Oral)  Resp 12  Ht 5\' 5"  (1.651 m)  Wt 173 lb 0.6 oz (78.49 kg)  BMI 28.80 kg/m2  SpO2 99%       Objective:   Physical Exam  Constitutional: She appears well-developed. No distress.  Cardiovascular: Normal rate and regular rhythm.   No murmur heard. Pulmonary/Chest: Effort normal and breath sounds normal. No respiratory distress. She has no wheezes. She has no rales. She exhibits no tenderness.  Musculoskeletal: She exhibits no edema.  Psychiatric: She has a normal mood and affect. Her behavior is normal. Judgment and thought content normal.          Assessment & Plan:

## 2011-11-05 NOTE — Assessment & Plan Note (Signed)
Unchanged.  Will increase zoloft to 100mg .  She pays out of pockets for meds so cost is a concern.  Consider adding another generic next visit if no improvement.

## 2011-11-05 NOTE — Telephone Encounter (Signed)
Spoke with pt.  She confirms + loud snoring as well as day time somnolence.

## 2011-11-07 ENCOUNTER — Telehealth: Payer: Self-pay | Admitting: *Deleted

## 2011-11-07 NOTE — Telephone Encounter (Signed)
Received form for completion re: split night sleep study. Form completed and faxed to [facility] @ [phone].

## 2011-11-12 ENCOUNTER — Other Ambulatory Visit: Payer: Self-pay | Admitting: Family

## 2011-11-12 NOTE — Telephone Encounter (Signed)
Called pharmacy to check to see if pt still had refills on file for Microgestin. Per pharmacy tech pt has filled rx for Microgestin 1.5/30 on these dates: 2/8, 2/27, 3/20, 4/11, 5/5, 5/26, 6/17, 7/7, 7/30, 8/15, 9/9 and 10/1. Please advise.

## 2011-11-12 NOTE — Telephone Encounter (Signed)
Microgestin Fe 1.5/30 requested [Last Rx 02.08.13 #[id number]]; pharmacy not yet open to confirm, will try again/SLS

## 2011-11-13 NOTE — Telephone Encounter (Signed)
She has had hysterectomy, so I believe she is skipping the placebo week.  OK to send 1 pak with 11 refills.

## 2011-11-14 ENCOUNTER — Ambulatory Visit: Payer: Managed Care, Other (non HMO) | Admitting: Cardiology

## 2011-11-23 ENCOUNTER — Encounter (HOSPITAL_BASED_OUTPATIENT_CLINIC_OR_DEPARTMENT_OTHER): Payer: Managed Care, Other (non HMO)

## 2011-12-01 ENCOUNTER — Other Ambulatory Visit: Payer: Self-pay | Admitting: Family

## 2011-12-10 ENCOUNTER — Other Ambulatory Visit: Payer: Self-pay | Admitting: Family

## 2011-12-22 ENCOUNTER — Other Ambulatory Visit: Payer: Self-pay | Admitting: Family

## 2011-12-22 NOTE — Telephone Encounter (Signed)
Pt last seen in October and advised 1 month f/u.  A 30 day supply has been sent to her pharmacy for: metoprolol, sertraline and HCTZ. Pt needs to be seen for further refills. Please call pt to arrange appt.

## 2011-12-23 NOTE — Telephone Encounter (Signed)
Patient scheduled an appointment for 01/13/12 at 9:45

## 2011-12-23 NOTE — Telephone Encounter (Signed)
Left message for patient to return my call.

## 2012-01-12 ENCOUNTER — Encounter: Payer: Self-pay | Admitting: Family

## 2012-01-12 ENCOUNTER — Ambulatory Visit (INDEPENDENT_AMBULATORY_CARE_PROVIDER_SITE_OTHER): Payer: Managed Care, Other (non HMO) | Admitting: Family

## 2012-01-12 VITALS — BP 130/90 | HR 76 | Temp 98.0°F | Resp 16 | Ht 65.0 in | Wt 180.0 lb

## 2012-01-12 DIAGNOSIS — R21 Rash and other nonspecific skin eruption: Secondary | ICD-10-CM

## 2012-01-12 DIAGNOSIS — F341 Dysthymic disorder: Secondary | ICD-10-CM

## 2012-01-12 DIAGNOSIS — R635 Abnormal weight gain: Secondary | ICD-10-CM

## 2012-01-12 DIAGNOSIS — F418 Other specified anxiety disorders: Secondary | ICD-10-CM

## 2012-01-12 LAB — TSH: TSH: 1.264 u[IU]/mL (ref 0.350–4.500)

## 2012-01-12 MED ORDER — DULOXETINE HCL 60 MG PO CPEP: 60.0000 mg | ORAL_CAPSULE | Freq: Every day | ORAL | Status: DC

## 2012-01-12 MED ORDER — PREDNISONE 10 MG PO TABS: ORAL_TABLET | ORAL | Status: DC

## 2012-01-12 NOTE — Patient Instructions (Addendum)
Cut zoloft in half and take along with cymbalta 30mg  once daily for 1 week. On week two, stop zoloft and increase cymbalta to 60 mg once daily. Go to ER if you develop shortness of breath, tongue/lip swelling. Follow up in 1 month.

## 2012-01-12 NOTE — Progress Notes (Signed)
Subjective:    Patient ID: Nicole Quinn, female    DOB: Jul 26, 1975, 36 y.o.   MRN: [id number]  HPI  Anxiety/depression- She continues zoloft.  Reports that depression is "about the same." More bad than good days. "Just doesn't want to do anything."  Rash/hives-  2-3 weeks. No tongue/lip swelling, no wheezing.  Reports + allergy to fresh fruit.  Shellfish.  ? Peanut allergy.    Weight gain- she reports that she continues to eat a healthy diet and exercise.   Review of Systems    see HPI  Past Medical History  Diagnosis Date  . Anemia   . Allergy   . Hypertension   . History of UTI   . Endometriosis     History   Social History  . Marital Status: Single    Spouse Name: N/A    Number of Children: 2  . Years of Education: N/A   Occupational History  .      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 Smoking status: Never Smoker   . Smokeless tobacco: Never Used  . Alcohol Use: No  . Drug Use: Not on file  . Sexually Active: Not on file   Other Topics Concern  . Not on file   Social History Narrative   Regular exercise: yes.   Lives with one child.  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      Past Surgical History  Procedure Date  . Abdominal hysterectomy 2003    partial  . Carpal tunnel release     right  . Ganglion cyst excision     right    Family History  Problem Relation Age of Onset  . Arthritis Mother   . Hyperlipidemia Mother   . Hypertension Mother   . Hypertension Father   . Diabetes Father   . Allergies Daughter   . Allergies Son   . Arthritis Maternal Grandmother   . Stroke Maternal Grandmother 50  . Hypertension Maternal Grandmother   . Leukemia Paternal Grandmother   . Coronary artery disease Maternal Aunt 50  . Coronary artery disease Maternal Aunt 50    Allergies  Allergen Reactions  . Amoxicillin     rash  . Ace Inhibitors     REACTION: lip swelling    Current Outpatient Prescriptions on File  Prior to Visit  Medication Sig Dispense Refill  . albuterol (PROVENTIL HFA;VENTOLIN HFA) 108 (90 BASE) MCG/ACT inhaler Inhale 2 puffs into the lungs every 6 (six) hours as needed for wheezing.  1 Inhaler  0  . benzoyl peroxide-erythromycin (BENZAMYCIN) gel Apply topically 2 (two) times daily.  23.3 g  2  . furosemide (LASIX) 20 MG tablet TAKE ONE TABLET BY MOUTH EVERY DAY  30 tablet  2  . hydrochlorothiazide (HYDRODIURIL) 25 MG tablet TAKE ONE TABLET BY MOUTH EVERY DAY  30 tablet  0  . metoprolol succinate (TOPROL-XL) 25 MG 24 hr tablet TAKE ONE TABLET BY MOUTH EVERY DAY  30 tablet  0  . metroNIDAZOLE (FLAGYL) 500 MG tablet TAKE ONE TABLET BY MOUTH TWICE DAILY  14 tablet  0  . MICROGESTIN 1.5-30 MG-MCG tablet TAKE ONE TABLET BY MOUTH EVERY DAY  1 each  11  . minocycline (MINOCIN,DYNACIN) 50 MG capsule TAKE ONE CAPSULE BY MOUTH TWICE DAILY  60 capsule  3  . Multiple Vitamin (MULTIVITAMIN) tablet Take 1 tablet by mouth daily.        . potassium chloride SA (K-DUR,KLOR-CON)  20 MEQ tablet Take 1 tablet (20 mEq total) by mouth daily.  30 tablet  0  . sertraline (ZOLOFT) 100 MG tablet TAKE ONE TABLET BY MOUTH EVERY DAY  30 tablet  0    BP 130/90  Pulse 76  Temp 98 F (36.7 C) (Oral)  Resp 16  Ht 5\' 5"  (1.651 m)  Wt 180 lb (81.647 kg)  BMI 29.95 kg/m2  SpO2 99%     Objective:   Physical Exam  Constitutional: She appears well-developed and well-nourished. No distress.  Cardiovascular: Normal rate and regular rhythm.   No murmur heard. Pulmonary/Chest: Effort normal and breath sounds normal. No respiratory distress. She has no wheezes. She has no rales. She exhibits no tenderness.  Skin:       No visible rashes noted.           Assessment & Plan:

## 2012-01-13 ENCOUNTER — Telehealth: Payer: Self-pay | Admitting: *Deleted

## 2012-01-13 ENCOUNTER — Ambulatory Visit: Payer: Managed Care, Other (non HMO) | Admitting: Family

## 2012-01-13 DIAGNOSIS — R635 Abnormal weight gain: Secondary | ICD-10-CM | POA: Insufficient documentation

## 2012-01-13 DIAGNOSIS — R21 Rash and other nonspecific skin eruption: Secondary | ICD-10-CM | POA: Insufficient documentation

## 2012-01-13 NOTE — Telephone Encounter (Signed)
Pt reports that at OV 12.23.13 she was px Cymbalta & it was discussed that if cost of Rx was too high w/Insurance that provider would make change in medication. Pt is calling to request that change at this time d/t cost of medication/SLS Please advise. Thanks.

## 2012-01-13 NOTE — Assessment & Plan Note (Signed)
Unchanged.  Will try switching zoloft to generic Cymbalta.  Seven 30mg  tab samples provided as well as twenty-one 60 mg tabs.  See directions as outlined in pt instructions.

## 2012-01-13 NOTE — Assessment & Plan Note (Signed)
Looks ok today, but given hx and symptoms, will plan rx with prednisone and add zyrtec.  She is instructed to go to the ED if tongue/lip swelling occurs.

## 2012-01-13 NOTE — Assessment & Plan Note (Signed)
Obtain TSH °

## 2012-01-13 NOTE — Telephone Encounter (Signed)
Spoke with pt, gave her link to coupon for cymbalta.  She will call me on Friday if this does not help with her cost.

## 2012-01-13 NOTE — Assessment & Plan Note (Signed)
>>  ASSESSMENT AND PLAN FOR ANXIETY ASSOCIATED WITH DEPRESSION WRITTEN ON 01/13/2012  2:06 PM BY O'SULLIVAN, Jeanelle Dake, NP  Unchanged.  Will try switching zoloft to generic Cymbalta.  Seven 30mg  tab samples provided as well as twenty-one 60 mg tabs.  See directions as outlined in pt instructions.

## 2012-01-16 MED ORDER — FLUOXETINE HCL 20 MG PO TABS: 20.0000 mg | ORAL_TABLET | Freq: Every day | ORAL | Status: DC

## 2012-01-16 NOTE — Telephone Encounter (Signed)
Patient called back to report that coupon did not work at pharmacy [for Cymbalta] & would like your recommendation on "what to do next"./SLS Please advise.

## 2012-01-16 NOTE — Addendum Note (Signed)
Addended by: Sandford Craze on: 01/16/2012 01:14 PM   Modules accepted: Orders

## 2012-01-16 NOTE — Telephone Encounter (Signed)
Spoke with pt.  Will try fluoxetine in place of sertraline.  Pt is agreeable.

## 2012-01-17 ENCOUNTER — Other Ambulatory Visit: Payer: Self-pay | Admitting: Family

## 2012-01-19 MED ORDER — FLUOXETINE HCL 20 MG PO CAPS: 20.0000 mg | ORAL_CAPSULE | Freq: Every day | ORAL | Status: DC

## 2012-01-19 NOTE — Telephone Encounter (Signed)
Pt also left message that insurance will not cover fluoxetine tablets but will cover the capsules. Pt requests that rx be sent for capsules. Rx sent. Pt notified.

## 2012-01-23 ENCOUNTER — Telehealth: Payer: Self-pay | Admitting: *Deleted

## 2012-01-23 DIAGNOSIS — L509 Urticaria, unspecified: Secondary | ICD-10-CM

## 2012-01-23 DIAGNOSIS — F418 Other specified anxiety disorders: Secondary | ICD-10-CM

## 2012-01-23 NOTE — Telephone Encounter (Signed)
Received message from pt that she has completed prednisone and still has itching although it is some better, not sleeping at all and felt like heart was racing through the night. Pt reports that she is unsure if she is not sleeping due to the itching or for some other reason. Falls asleep but is not able to stay asleep. Does not find herself scratching when she wakes up.  Reports that heart still feels like it is racing like she is "anxious". Also feels a little short of breath but thinks it is due to her anxiety. Pt did not sound short of breath while talking on the phone. Pt also notes some swelling in her feet and legs this week. Pt currently taking Fluoxetine since this Monday. Please advise.

## 2012-01-26 MED ORDER — ALPRAZOLAM 0.25 MG PO TABS: ORAL_TABLET | ORAL | Status: DC

## 2012-01-26 NOTE — Telephone Encounter (Signed)
Rash is better but still itching.  She saw allergist at [facility].    Depression- mood is feels a little more agitated.  More anxious.   Refer to psych. Add xanax prn- pt advised to use sparingly as it can be habit forming  Swelling-has gained 5 pounds since last visit. Continues hctz, recommended furosemide once daily x 3 days.

## 2012-02-11 ENCOUNTER — Other Ambulatory Visit: Payer: Self-pay | Admitting: Family

## 2012-02-20 ENCOUNTER — Other Ambulatory Visit: Payer: Self-pay | Admitting: Family

## 2012-02-20 NOTE — Telephone Encounter (Signed)
Alprazolam last filled on 01/26/12, will wait until Monday to send refill.

## 2012-02-24 ENCOUNTER — Other Ambulatory Visit: Payer: Self-pay | Admitting: Family

## 2012-02-24 NOTE — Telephone Encounter (Signed)
Rx left on pharmacy voicemail, #30 x no refills.

## 2012-02-24 NOTE — Telephone Encounter (Signed)
Current alprazolam request denied as refill was just called to pharmacy voicemail this morning. Note sent to pharmacy.

## 2012-03-04 ENCOUNTER — Ambulatory Visit: Payer: Self-pay | Admitting: Family

## 2012-03-09 ENCOUNTER — Encounter: Payer: Self-pay | Admitting: Family

## 2012-03-09 ENCOUNTER — Ambulatory Visit (INDEPENDENT_AMBULATORY_CARE_PROVIDER_SITE_OTHER): Payer: Managed Care, Other (non HMO) | Admitting: Family

## 2012-03-09 VITALS — BP 110/86 | HR 77 | Temp 98.6°F | Resp 16 | Ht 65.0 in | Wt 195.0 lb

## 2012-03-09 DIAGNOSIS — F341 Dysthymic disorder: Secondary | ICD-10-CM

## 2012-03-09 DIAGNOSIS — L708 Other acne: Secondary | ICD-10-CM

## 2012-03-09 DIAGNOSIS — F418 Other specified anxiety disorders: Secondary | ICD-10-CM

## 2012-03-09 MED ORDER — FUROSEMIDE 20 MG PO TABS: 20.0000 mg | ORAL_TABLET | Freq: Every day | ORAL | Status: DC | PRN

## 2012-03-09 MED ORDER — FLUOXETINE HCL 10 MG PO TABS: 10.0000 mg | ORAL_TABLET | Freq: Every day | ORAL | Status: DC

## 2012-03-09 MED ORDER — NORETHIN ACE-ETH ESTRAD-FE 1.5-30 MG-MCG PO TABS: 1.0000 | ORAL_TABLET | Freq: Every day | ORAL | Status: DC

## 2012-03-09 NOTE — Patient Instructions (Addendum)
Fluoxetine 10mg  once daily for 2 weeks, then stop.   Please follow up in 1 month.

## 2012-03-09 NOTE — Assessment & Plan Note (Addendum)
20 minutes spent with pt today.  >50% of this time was spent counseling pt on her depression/anxiety. Stable, now less stress at home.  Weight gain and fatigue are likely related to SSRI.  She would like to try to come off of med.  Will taper.  If symptoms worsen, consider trial of effexor.

## 2012-03-09 NOTE — Assessment & Plan Note (Signed)
>>  ASSESSMENT AND PLAN FOR ANXIETY ASSOCIATED WITH DEPRESSION WRITTEN ON 03/09/2012  1:45 PM BY O'SULLIVAN, Tydarius Yawn, NP  20 minutes spent with pt today.  >50% of this time was spent counseling pt on her depression/anxiety. Stable, now less stress at home.  Weight gain and fatigue are likely related to SSRI.  She would like to try to come off of med.  Will taper.  If symptoms worsen, consider trial of effexor.

## 2012-03-09 NOTE — Assessment & Plan Note (Signed)
Deteriorated[facility]- [facility] junel 1.5 in stock.  Written Rx provided to the patient.

## 2012-03-09 NOTE — Progress Notes (Signed)
Subjective:    Patient ID: Nicole Quinn, female    DOB: Sep 12, 1975, 37 y.o.   MRN: [id number]  HPI  [name] is a 37 yr old female who presents today for follow up.  1) anxiety- well controlled. Reports that things are less stressful at home. She continues to work out and eat healthy but notes a steady weight increase since she started the fluoxetine.  Depression is well controlled. Still feels "so tired."   2) Acne- was unable to get microgestin (pharmacy was unable to get) She stopped microgestin and notes flare up of her acne.   Review of Systems See HPI  Past Medical History  Diagnosis Date  . Anemia   . Allergy   . Hypertension   . History of UTI   . Endometriosis     History   Social History  . Marital Status: Single    Spouse Name: N/A    Number of Children: 2  . Years of Education: N/A   Occupational History  .      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 Smoking status: Never Smoker   . Smokeless tobacco: Never Used  . Alcohol Use: No  . Drug Use: Not on file  . Sexually Active: Not on file   Other Topics Concern  . Not on file   Social History Narrative   Regular exercise: yes.   Lives with one child.  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            Past Surgical History  Procedure Laterality Date  . Abdominal hysterectomy  2003    partial  . Carpal tunnel release      right  . Ganglion cyst excision      right    Family History  Problem Relation Age of Onset  . Arthritis Mother   . Hyperlipidemia Mother   . Hypertension Mother   . Hypertension Father   . Diabetes Father   . Allergies Daughter   . Allergies Son   . Arthritis Maternal Grandmother   . Stroke Maternal Grandmother 50  . Hypertension Maternal Grandmother   . Leukemia Paternal Grandmother   . Coronary artery disease Maternal Aunt 50  . Coronary artery disease Maternal Aunt 50    Allergies  Allergen Reactions  . Amoxicillin     rash   . Ace Inhibitors     REACTION: lip swelling    Current Outpatient Prescriptions on File Prior to Visit  Medication Sig Dispense Refill  . ALPRAZolam (XANAX) 0.25 MG tablet TAKE ONE TABLET BY MOUTH THREE TIMES DAILY AS NEEDED FOR ANXIETY.  MAY BE USED PRIOR TO BED AS NEEDED FOR SLEEP.  30 tablet  0  . hydrochlorothiazide (HYDRODIURIL) 25 MG tablet TAKE ONE TABLET BY MOUTH EVERY DAY  30 tablet  2  . metoprolol succinate (TOPROL-XL) 25 MG 24 hr tablet TAKE ONE TABLET BY MOUTH EVERY DAY  30 tablet  2  . Multiple Vitamin (MULTIVITAMIN) tablet Take 1 tablet by mouth daily.        . cetirizine (ZYRTEC) 10 MG tablet 10 mg.      . MICROGESTIN 1.5-30 MG-MCG tablet TAKE ONE TABLET BY MOUTH EVERY DAY  1 each  11   No current facility-administered medications on file prior to visit.    BP 110/86  Pulse 77  Temp(Src) 98.6 F (37 C) (Oral)  Resp 16  Ht 5\' 5"  (1.651 m)  Wt 195 lb (  88.451 kg)  BMI 32.45 kg/m2  SpO2 99%        Objective:   Physical Exam  Constitutional: She is oriented to person, place, and time. She appears well-developed and well-nourished. No distress.  Pulmonary/Chest: Effort normal.  Neurological: She is alert and oriented to person, place, and time. No cranial nerve deficit.  Skin: Skin is warm and dry.  Acne noted on cheeks.  Psychiatric: She has a normal mood and affect. Her behavior is normal. Judgment and thought content normal.          Assessment & Plan:

## 2012-03-25 ENCOUNTER — Other Ambulatory Visit: Payer: Self-pay | Admitting: Family

## 2012-04-05 ENCOUNTER — Telehealth: Payer: Self-pay | Admitting: *Deleted

## 2012-04-05 NOTE — Telephone Encounter (Signed)
Received message from pt stating her BP and cholesterol were elevated at her recent health screening. Left detailed message on pt's cell# to call and arrange appt as last visit (03/09/12) pt was advised to f/u in 1 month. Advised pt to bring results with her and we discuss those at that time.

## 2012-04-09 ENCOUNTER — Encounter: Payer: Self-pay | Admitting: Family

## 2012-04-09 ENCOUNTER — Ambulatory Visit (INDEPENDENT_AMBULATORY_CARE_PROVIDER_SITE_OTHER): Payer: Managed Care, Other (non HMO) | Admitting: Family

## 2012-04-09 VITALS — BP 130/84 | HR 90 | Temp 98.1°F | Resp 16 | Ht 65.0 in | Wt 198.0 lb

## 2012-04-09 DIAGNOSIS — F418 Other specified anxiety disorders: Secondary | ICD-10-CM

## 2012-04-09 DIAGNOSIS — I10 Essential (primary) hypertension: Secondary | ICD-10-CM

## 2012-04-09 DIAGNOSIS — F341 Dysthymic disorder: Secondary | ICD-10-CM

## 2012-04-09 MED ORDER — FLUTICASONE PROPIONATE 50 MCG/ACT NA SUSP: 2.0000 | Freq: Every day | NASAL | Status: DC

## 2012-04-09 NOTE — Patient Instructions (Addendum)
Please keep up the good work with the healthy diet and exercise.

## 2012-04-09 NOTE — Progress Notes (Signed)
Subjective:    Patient ID: Nicole Quinn, female    DOB: [birth year], 37 y.o.   MRN: [id number]  HPI  Nicole Quinn is a 37 yr old female who presents today for follow up of her anxiety and depression.  Last visit she felt that her anxiety and depression were well controlled. She was frustrated by recent weight gain and tiredness.  Wished to wean off of prozac. Since that time she has successfully weaned off of the prozac.  She is working out twice a day- >2 hours a day in all. 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  Has gained a few pounds since last visit. Completely off prozac x 1 week.  HTN- notes that she had elevated BP at recent health fair.    Review of Systems See HPI  Past Medical History  Diagnosis Date  . Anemia   . Allergy   . Hypertension   . History of UTI   . Endometriosis     History   Social History  . Marital Status: Single    Spouse Name: N/A    Number of Children: 2  . Years of Education: N/A   Occupational History  .      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 Smoking status: Never Smoker   . Smokeless tobacco: Never Used  . Alcohol Use: No  . Drug Use: Not on file  . Sexually Active: Not on file   Other Topics Concern  . Not on file   Social History Narrative   Regular exercise: yes.   Lives with one child.  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            Past Surgical History  Procedure Laterality Date  . Abdominal hysterectomy  2003    partial  . Carpal tunnel release      right  . Ganglion cyst excision      right    Family History  Problem Relation Age of Onset  . Arthritis Mother   . Hyperlipidemia Mother   . Hypertension Mother   . Hypertension Father   . Diabetes Father   . Allergies Daughter   . Allergies Son   . Arthritis Maternal Grandmother   . Stroke Maternal Grandmother 50  . Hypertension Maternal Grandmother   . Leukemia Paternal Grandmother   . Coronary artery disease Maternal Aunt 50   . Coronary artery disease Maternal Aunt 50    Allergies  Allergen Reactions  . Amoxicillin     rash  . Ace Inhibitors     REACTION: lip swelling    Current Outpatient Prescriptions on File Prior to Visit  Medication Sig Dispense Refill  . ALPRAZolam (XANAX) 0.25 MG tablet TAKE ONE TABLET BY MOUTH THREE TIMES DAILY AS NEEDED FOR ANXIETY.  MAY BE USED PRIOR TO BED AS NEEDED FOR SLEEP.  30 tablet  0  . furosemide (LASIX) 20 MG tablet Take 1 tablet (20 mg total) by mouth daily as needed.  30 tablet  1  . hydrochlorothiazide (HYDRODIURIL) 25 MG tablet TAKE ONE TABLET BY MOUTH EVERY DAY  30 tablet  3  . metoprolol succinate (TOPROL-XL) 25 MG 24 hr tablet TAKE ONE TABLET BY MOUTH EVERY DAY  30 tablet  2  . MICROGESTIN 1.5-30 MG-MCG tablet TAKE ONE TABLET BY MOUTH EVERY DAY  1 each  11  . Multiple Vitamin (MULTIVITAMIN) tablet Take 1 tablet by mouth daily.        Marland Kitchen  norethindrone-ethinyl estradiol-iron (MICROGESTIN FE,GILDESS FE,LOESTRIN FE) 1.5-30 MG-MCG tablet Take 1 tablet by mouth daily.  1 Package  11  . FLUoxetine (PROZAC) 10 MG tablet Take 1 tablet (10 mg total) by mouth daily.  30 tablet  0   No current facility-administered medications on file prior to visit.    BP 130/84  Pulse 90  Temp(Src) 98.1 F (36.7 C) (Oral)  Resp 16  Ht 5\' 5"  (1.651 m)  Wt 198 lb (89.812 kg)  BMI 32.95 kg/m2  SpO2 99%       Objective:   Physical Exam        Assessment & Plan:

## 2012-04-09 NOTE — Assessment & Plan Note (Signed)
BP looks good here today in office.cont toprol and hctz.

## 2012-04-09 NOTE — Assessment & Plan Note (Signed)
>>  ASSESSMENT AND PLAN FOR ANXIETY ASSOCIATED WITH DEPRESSION WRITTEN ON 04/09/2012  9:41 PM BY O'SULLIVAN, Kamdon Reisig, NP  Stable off of fluoxetine. Monitor. Hopefully it will be easier for her to lose weight now that she is off prozac. I commended her hard work.

## 2012-04-09 NOTE — Assessment & Plan Note (Signed)
Stable off of fluoxetine. Monitor. Hopefully it will be easier for her to lose weight now that she is off prozac. I commended her hard work.

## 2012-04-22 ENCOUNTER — Other Ambulatory Visit: Payer: Self-pay | Admitting: Family

## 2012-04-22 NOTE — Telephone Encounter (Signed)
Pt requests refill of metronidazole. Pt reports slightly white vaginal discharge and odor. Please advise.

## 2012-04-22 NOTE — Telephone Encounter (Signed)
Sent!

## 2012-04-26 ENCOUNTER — Other Ambulatory Visit: Payer: Self-pay | Admitting: Family

## 2012-04-26 NOTE — Telephone Encounter (Signed)
Rx request to pharmacy/SLS  

## 2012-05-19 ENCOUNTER — Other Ambulatory Visit: Payer: Self-pay | Admitting: Family

## 2012-05-20 NOTE — Telephone Encounter (Signed)
Received refill requests for lasix and prozac.  Per 04/09/12 office note pt had weaned off of Prozac. Left detailed message for pt to call and verify if she is requesting to go back on medication.

## 2012-05-21 MED ORDER — FUROSEMIDE 20 MG PO TABS: ORAL_TABLET | ORAL | Status: DC

## 2012-05-21 NOTE — Addendum Note (Signed)
Addended by: Regis Bill on: 05/21/2012 04:56 PM   Modules accepted: Orders

## 2012-05-21 NOTE — Telephone Encounter (Signed)
Refill error-resent Rx to pharmacy/SLS

## 2012-05-21 NOTE — Telephone Encounter (Signed)
Prozac request DENIED--Medication Discontinued by Provider 03.21.14/SLS Lasix Rx request to pharmacy/SLS

## 2012-05-21 NOTE — Telephone Encounter (Signed)
Pt returned my call stating she feels she needs to restart Prozac. Is it ok to send rx for previous dose/directions?

## 2012-06-29 ENCOUNTER — Other Ambulatory Visit: Payer: Self-pay | Admitting: Family

## 2012-06-30 NOTE — Telephone Encounter (Signed)
HCTZ rx transmission to pharmacy failed. Rx called to pharmacy voicemail as below.  hydrochlorothiazide (HYDRODIURIL) 25 MG tablet 30 tablet 2 06/29/2012 Sig: TAKE ONE TABLET BY MOUTH ONCE DAILY E-Prescribing Status: Transmission to pharmacy failed (06/29/2012 9:45 AM EDT

## 2012-07-01 ENCOUNTER — Telehealth: Payer: Self-pay | Admitting: Family

## 2012-07-01 MED ORDER — METRONIDAZOLE 500 MG PO TABS: ORAL_TABLET | ORAL | Status: DC

## 2012-07-01 NOTE — Addendum Note (Signed)
Addended by: Sandford Craze on: 07/01/2012 10:37 PM   Modules accepted: Orders

## 2012-07-01 NOTE — Telephone Encounter (Signed)
Refill- metronidalzole 500mg  tab. Take one tablet by mouth twice daily. Qty 14 last fill 4.4.14

## 2012-07-01 NOTE — Telephone Encounter (Signed)
Please advise 

## 2012-07-01 NOTE — Telephone Encounter (Signed)
rx sent

## 2012-07-30 ENCOUNTER — Other Ambulatory Visit: Payer: Self-pay | Admitting: Family

## 2012-07-30 ENCOUNTER — Telehealth: Payer: Self-pay | Admitting: Family

## 2012-07-30 MED ORDER — FUROSEMIDE 20 MG PO TABS: ORAL_TABLET | ORAL | Status: DC

## 2012-07-30 NOTE — Telephone Encounter (Signed)
Refill- lasix 20mg  tab. Take one tablet by mouth once daily. Qty 30 last fill 6.20.14

## 2012-07-30 NOTE — Telephone Encounter (Signed)
Refill sent.

## 2012-07-30 NOTE — Telephone Encounter (Signed)
Rx transmission failed and was called to pharmacy voicemail.

## 2012-08-20 ENCOUNTER — Telehealth: Payer: Self-pay | Admitting: *Deleted

## 2012-08-20 ENCOUNTER — Ambulatory Visit (INDEPENDENT_AMBULATORY_CARE_PROVIDER_SITE_OTHER): Payer: Managed Care, Other (non HMO) | Admitting: Family

## 2012-08-20 ENCOUNTER — Encounter: Payer: Self-pay | Admitting: Family

## 2012-08-20 VITALS — BP 120/80 | HR 78 | Temp 97.6°F | Resp 18 | Ht 65.0 in | Wt 188.0 lb

## 2012-08-20 DIAGNOSIS — N760 Acute vaginitis: Secondary | ICD-10-CM | POA: Insufficient documentation

## 2012-08-20 MED ORDER — FLUCONAZOLE 150 MG PO TABS: ORAL_TABLET | ORAL | Status: DC

## 2012-08-20 NOTE — Addendum Note (Signed)
Addended by: Mervin Kung A on: 08/20/2012 11:04 AM   Modules accepted: Orders

## 2012-08-20 NOTE — Progress Notes (Signed)
Subjective:    Patient ID: Nicole Quinn, female    DOB: 1975-06-07, 37 y.o.   MRN: [id number]  HPI  [name] is a 37 yr old female who presents today with chief complaint of vaginal discomfort.  Reports that it is "irritating." Reports + dyspareunia.  + thick white vaginal discharge. Denies vaginal odor. Denies risk factor for STD. Denies fever. Has tried monistat without improvement.   Review of Systems See HPI  Past Medical History  Diagnosis Date  . Anemia   . Allergy   . Hypertension   . History of UTI   . Endometriosis     History   Social History  . Marital Status: Single    Spouse Name: N/A    Number of Children: 2  . Years of Education: N/A   Occupational History  .      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 Smoking status: Never Smoker   . Smokeless tobacco: Never Used  . Alcohol Use: No  . Drug Use: Not on file  . Sexually Active: Not on file   Other Topics Concern  . Not on file   Social History Narrative   Regular exercise: yes.   Lives with one child.  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            Past Surgical History  Procedure Laterality Date  . Abdominal hysterectomy  2003    partial  . Carpal tunnel release      right  . Ganglion cyst excision      right    Family History  Problem Relation Age of Onset  . Arthritis Mother   . Hyperlipidemia Mother   . Hypertension Mother   . Hypertension Father   . Diabetes Father   . Allergies Daughter   . Allergies Son   . Arthritis Maternal Grandmother   . Stroke Maternal Grandmother 50  . Hypertension Maternal Grandmother   . Leukemia Paternal Grandmother   . Coronary artery disease Maternal Aunt 50  . Coronary artery disease Maternal Aunt 50    Allergies  Allergen Reactions  . Amoxicillin     rash  . Ace Inhibitors     REACTION: lip swelling    Current Outpatient Prescriptions on File Prior to Visit  Medication Sig Dispense Refill  .  furosemide (LASIX) 20 MG tablet TAKE ONE TABLET BY MOUTH ONCE DAILY AS NEEDED  30 tablet  2  . hydrochlorothiazide (HYDRODIURIL) 25 MG tablet TAKE ONE TABLET BY MOUTH ONCE DAILY  30 tablet  2  . metoprolol succinate (TOPROL-XL) 25 MG 24 hr tablet TAKE ONE TABLET BY MOUTH ONCE DAILY  30 tablet  2  . Multiple Vitamin (MULTIVITAMIN) tablet Take 1 tablet by mouth daily.        . norethindrone-ethinyl estradiol-iron (MICROGESTIN FE,GILDESS FE,LOESTRIN FE) 1.5-30 MG-MCG tablet Take 1 tablet by mouth daily.  1 Package  11  . ALPRAZolam (XANAX) 0.25 MG tablet TAKE ONE TABLET BY MOUTH THREE TIMES DAILY AS NEEDED FOR ANXIETY.  MAY BE USED PRIOR TO BED AS NEEDED FOR SLEEP.  30 tablet  0   No current facility-administered medications on file prior to visit.    BP 120/80  Pulse 78  Temp(Src) 97.6 F (36.4 C) (Oral)  Resp 18  Ht 5\' 5"  (1.651 m)  Wt 188 lb (85.276 kg)  BMI 31.28 kg/m2  SpO2 96%       Objective:   Physical Exam  Constitutional: She appears well-developed and well-nourished. No distress.  Genitourinary:  Speculum exam reveals thick white discharge adherent to the vaginal walls. No vaginal lesions noted.           Assessment & Plan:

## 2012-08-20 NOTE — Patient Instructions (Addendum)
We will contact you with your results. Have a nice weekend!

## 2012-08-20 NOTE — Assessment & Plan Note (Signed)
Suspect yeast.  Will rx with diflucan, wet prep performed and sent to lab for evaluation.

## 2012-08-20 NOTE — Telephone Encounter (Signed)
Received message from pt reporting increased vaginal discomfort, irritation and swelling. Reports that OTC monistat has not been helping. Scheduled pt appt today at 10am for evaluation.

## 2012-08-21 LAB — WET PREP BY MOLECULAR PROBE
Candida species: NEGATIVE
Gardnerella vaginalis: POSITIVE — AB
Trichomonas vaginosis: NEGATIVE

## 2012-08-23 ENCOUNTER — Telehealth: Payer: Self-pay | Admitting: Family

## 2012-08-23 MED ORDER — METRONIDAZOLE 500 MG PO TABS: 500.0000 mg | ORAL_TABLET | Freq: Two times a day (BID) | ORAL | Status: DC

## 2012-08-23 NOTE — Telephone Encounter (Signed)
Patient notified of results and informed of rx.

## 2012-08-23 NOTE — Telephone Encounter (Signed)
Please call pt and let her know that her wet prep shows BV. Rx sent to her pharmacy for metronidazole.

## 2012-08-24 ENCOUNTER — Other Ambulatory Visit: Payer: Self-pay | Admitting: Family

## 2012-08-25 NOTE — Telephone Encounter (Signed)
Wet prep from 5 days ago is negative for yeast. Unless, new or worsened symptoms,  No need for diflucan at this time.

## 2012-08-25 NOTE — Telephone Encounter (Signed)
eScribe request for refill on Diflucan Last filled - 08.01.14, #1x0 [states may repeat in 3 days] Last AEX - 08.01.14 Please Advise/SLS

## 2012-08-25 NOTE — Telephone Encounter (Signed)
Left detailed message on cell# and to call as indicated below.

## 2012-09-10 ENCOUNTER — Other Ambulatory Visit: Payer: Self-pay | Admitting: *Deleted

## 2012-09-10 MED ORDER — HYDROCHLOROTHIAZIDE 25 MG PO TABS: ORAL_TABLET | ORAL | Status: DC

## 2012-09-10 NOTE — Telephone Encounter (Signed)
Rx request to pharmacy/SLS  

## 2012-09-10 NOTE — Addendum Note (Signed)
Addended by: Sandford Craze on: 09/10/2012 04:40 PM   Modules accepted: Orders

## 2012-09-17 ENCOUNTER — Telehealth: Payer: Self-pay | Admitting: *Deleted

## 2012-09-17 NOTE — Telephone Encounter (Signed)
Received message from pt that her pharmacy is telling her they have not received our refill on her HCTZ. Left detailed message on pt's cell# that rx was refilled on 09/10/12, #30 x 2 refills and receipt to pharmacy was confirmed by our EMR. Advised pt to call and let us know if she is using a different pharmacy otherwise I will call her back after I speak with pharmacy when they open at 9am.  Spoke with pharmacist, she states they did not received refill from 09/10/12. Refill given verbally per 09/10/12 documentation. Notified pt.

## 2012-10-14 ENCOUNTER — Other Ambulatory Visit: Payer: Self-pay | Admitting: Family

## 2012-10-14 NOTE — Telephone Encounter (Signed)
Furosemide Rx transmission to pharmacy failed and was called to pharmacy voicemail.

## 2012-11-03 ENCOUNTER — Telehealth: Payer: Self-pay | Admitting: *Deleted

## 2012-11-03 NOTE — Telephone Encounter (Signed)
Left detailed message on cell and to call if any questions. 

## 2012-11-03 NOTE — Telephone Encounter (Signed)
I would recommend that she take the lasix 1 tablet daily for next few days in addition to her hctz.  If not improved should be seen back in office please.

## 2012-11-03 NOTE — Telephone Encounter (Signed)
Received message from pt stating the swelling in her legs and feet has been worse the last few days. Reports the swelling seem a little better this morning but want to know if you can increase her HCTZ dose?  Please advise.

## 2012-11-23 ENCOUNTER — Other Ambulatory Visit: Payer: Self-pay | Admitting: Family

## 2012-11-25 ENCOUNTER — Other Ambulatory Visit: Payer: Self-pay

## 2012-11-30 ENCOUNTER — Other Ambulatory Visit: Payer: Self-pay | Admitting: Family

## 2012-12-03 ENCOUNTER — Other Ambulatory Visit: Payer: Self-pay | Admitting: Family

## 2013-01-18 ENCOUNTER — Telehealth: Payer: Self-pay | Admitting: Family

## 2013-01-18 MED ORDER — FUROSEMIDE 20 MG PO TABS: ORAL_TABLET | ORAL | Status: DC

## 2013-01-18 NOTE — Telephone Encounter (Signed)
Please call pt to arrange fasting CPE in early 2015.

## 2013-01-18 NOTE — Telephone Encounter (Signed)
refill-furosemide 20mg  tab. Take one tablet by mouth once daily as needed. Qty 30 last fill 12.3.14

## 2013-01-18 NOTE — Telephone Encounter (Signed)
Informed patient of medication refill and she scheduled cpe for 02/22/13

## 2013-01-18 NOTE — Telephone Encounter (Signed)
Refill sent to pharmacy.  Pt last seen in March for f/u of BP and depression and for acute illness in August.  Pt has no future appts on file.  When should pt follow up in the office again?

## 2013-01-18 NOTE — Telephone Encounter (Signed)
Answering for Melissa while she is out of office:      Patient's BP seems stable at visits over the past year with current medication regimen.  She is overdue for a complete physical.  I would recommend the patient schedule an appointment for annual exam with fasting labs sometime in early 2015.  That way we can check her kidney function and electrolytes and make sure they are within normal limits while on medications.

## 2013-02-03 ENCOUNTER — Other Ambulatory Visit: Payer: Self-pay | Admitting: Family

## 2013-02-03 NOTE — Telephone Encounter (Signed)
Rx request to pharmacy/SLS  

## 2013-02-22 ENCOUNTER — Ambulatory Visit (INDEPENDENT_AMBULATORY_CARE_PROVIDER_SITE_OTHER): Payer: Managed Care, Other (non HMO) | Admitting: Family

## 2013-02-22 ENCOUNTER — Encounter: Payer: Self-pay | Admitting: Family

## 2013-02-22 VITALS — BP 120/88 | HR 64 | Temp 97.6°F | Resp 16 | Ht 64.5 in | Wt 181.0 lb

## 2013-02-22 DIAGNOSIS — K5900 Constipation, unspecified: Secondary | ICD-10-CM

## 2013-02-22 DIAGNOSIS — Z0000 Encounter for general adult medical examination without abnormal findings: Secondary | ICD-10-CM

## 2013-02-22 DIAGNOSIS — K5909 Other constipation: Secondary | ICD-10-CM | POA: Insufficient documentation

## 2013-02-22 DIAGNOSIS — N760 Acute vaginitis: Secondary | ICD-10-CM

## 2013-02-22 DIAGNOSIS — E785 Hyperlipidemia, unspecified: Secondary | ICD-10-CM

## 2013-02-22 DIAGNOSIS — F341 Dysthymic disorder: Secondary | ICD-10-CM

## 2013-02-22 DIAGNOSIS — Z23 Encounter for immunization: Secondary | ICD-10-CM

## 2013-02-22 DIAGNOSIS — N898 Other specified noninflammatory disorders of vagina: Secondary | ICD-10-CM

## 2013-02-22 DIAGNOSIS — F418 Other specified anxiety disorders: Secondary | ICD-10-CM

## 2013-02-22 LAB — LIPID PANEL
Cholesterol: 244 mg/dL — ABNORMAL HIGH (ref 0–200)
HDL: 61 mg/dL (ref >39)
LDL Cholesterol: 175 mg/dL — ABNORMAL HIGH (ref 0–99)
Total CHOL/HDL Ratio: 4 Ratio
Triglycerides: 42 mg/dL (ref <150)
VLDL: 8 mg/dL (ref 0–40)

## 2013-02-22 LAB — CBC WITH DIFFERENTIAL/PLATELET
Basophils Absolute: 0 10*3/uL (ref 0.0–0.1)
Basophils Relative: 1 % (ref 0–1)
Eosinophils Absolute: 0 10*3/uL (ref 0.0–0.7)
Eosinophils Relative: 1 % (ref 0–5)
HCT: 38.2 % (ref 36.0–46.0)
Hemoglobin: 13.2 g/dL (ref 12.0–15.0)
Lymphocytes Relative: 36 % (ref 12–46)
Lymphs Abs: 2.3 10*3/uL (ref 0.7–4.0)
MCH: 30.8 pg (ref 26.0–34.0)
MCHC: 34.6 g/dL (ref 30.0–36.0)
MCV: 89 fL (ref 78.0–100.0)
Monocytes Absolute: 0.4 10*3/uL (ref 0.1–1.0)
Monocytes Relative: 6 % (ref 3–12)
Neutro Abs: 3.6 10*3/uL (ref 1.7–7.7)
Neutrophils Relative %: 56 % (ref 43–77)
Platelets: 356 10*3/uL (ref 150–400)
RBC: 4.29 MIL/uL (ref 3.87–5.11)
RDW: 13.3 % (ref 11.5–15.5)
WBC: 6.2 10*3/uL (ref 4.0–10.5)

## 2013-02-22 LAB — BASIC METABOLIC PANEL WITH GFR
BUN: 15 mg/dL (ref 6–23)
CO2: 30 mEq/L (ref 19–32)
Calcium: 9.4 mg/dL (ref 8.4–10.5)
Chloride: 98 mEq/L (ref 96–112)
Creat: 0.8 mg/dL (ref 0.50–1.10)
GFR, Est African American: >89 mL/min
GFR, Est Non African American: >89 mL/min
Glucose, Bld: 84 mg/dL (ref 70–99)
Potassium: 3.5 mEq/L (ref 3.5–5.3)
Sodium: 139 mEq/L (ref 135–145)

## 2013-02-22 LAB — TSH: TSH: 0.35 u[IU]/mL (ref 0.350–4.500)

## 2013-02-22 LAB — HEPATIC FUNCTION PANEL
ALT: 17 U/L (ref 0–35)
AST: 27 U/L (ref 0–37)
Albumin: 4.5 g/dL (ref 3.5–5.2)
Alkaline Phosphatase: 45 U/L (ref 39–117)
Bilirubin, Direct: 0.1 mg/dL (ref 0.0–0.3)
Indirect Bilirubin: 0.4 mg/dL (ref 0.2–1.2)
Total Bilirubin: 0.5 mg/dL (ref 0.2–1.2)
Total Protein: 7.5 g/dL (ref 6.0–8.3)

## 2013-02-22 MED ORDER — LUBIPROSTONE 8 MCG PO CAPS: 8.0000 ug | ORAL_CAPSULE | Freq: Two times a day (BID) | ORAL | Status: DC

## 2013-02-22 MED ORDER — ESCITALOPRAM OXALATE 10 MG PO TABS: 10.0000 mg | ORAL_TABLET | Freq: Every day | ORAL | Status: DC

## 2013-02-22 MED ORDER — METOPROLOL SUCCINATE ER 25 MG PO TB24: ORAL_TABLET | ORAL | Status: DC

## 2013-02-22 MED ORDER — ALPRAZOLAM 0.25 MG PO TABS: ORAL_TABLET | ORAL | Status: DC

## 2013-02-22 MED ORDER — HYDROCHLOROTHIAZIDE 25 MG PO TABS: ORAL_TABLET | ORAL | Status: DC

## 2013-02-22 NOTE — Addendum Note (Signed)
Addended by: Kelle Darting A on: 02/22/2013 10:55 AM   Modules accepted: Orders

## 2013-02-22 NOTE — Assessment & Plan Note (Signed)
Wet prep performed today 

## 2013-02-22 NOTE — Assessment & Plan Note (Signed)
Mainly depression at this point.  Will give her a trial of lexapro.

## 2013-02-22 NOTE — Assessment & Plan Note (Signed)
>>  ASSESSMENT AND PLAN FOR ANXIETY ASSOCIATED WITH DEPRESSION WRITTEN ON 02/22/2013 10:48 AM BY O'SULLIVAN, Yareliz Thorstenson, NP  Mainly depression at this point.  Will give her a trial of lexapro.

## 2013-02-22 NOTE — Patient Instructions (Signed)
lexapro 10 mg. Start 1/2 tab once daily, then increase to a full tab once daily. Complete lab work prior to leaving.  Follow up in 1 month.

## 2013-02-22 NOTE — Progress Notes (Signed)
Subjective:    Patient ID: Nicole Quinn, female    DOB: 06/21/1975, 38 y.o.   MRN: [id number]  HPI  Mr.  Nicole Quinn is a 38 yr old female who presents today for cpx. Pt here for fasting physical.  Would like flu shot today.  Unsure of last tetanus. Pt had hysterectomy. She still has her ovaries. She reports that she has been sticking to 1200 calories a day and is working out regularly. She has lost 17 pounds since this time last year.   Wt Readings from Last 3 Encounters:  02/22/13 181 lb 0.6 oz (82.119 kg)  08/20/12 188 lb (85.276 kg)  04/09/12 198 lb (89.812 kg)   Depression- reports feeling emotionally labile, down, easily tearful.  Reports that last weekend she just "cried all weekend" for no reason. Denies SI/HI.  Vaginal discharge- reports + discharge, would like to complete wet prep while she is here. She has hx or recurrent BV.  She reports chronic constipation despite great water intake and diet high in fiber.  Has about 2 BM's a week.  Review of Systems    see HPI  Past Medical History  Diagnosis Date  . Anemia   . Allergy   . Hypertension   . History of UTI   . Endometriosis     History   Social History  . Marital Status: Single    Spouse Name: N/A    Number of Children: 2  . Years of Education: N/A   Occupational History  .      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 Smoking status: Never Smoker   . Smokeless tobacco: Never Used  . Alcohol Use: No  . Drug Use: Not on file  . Sexual Activity: Not on file   Other Topics Concern  . Not on file   Social History Narrative   Regular exercise: yes.   Lives with one child.  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            Past Surgical History  Procedure Laterality Date  . Abdominal hysterectomy  2003    partial  . Carpal tunnel release      right  . Ganglion cyst excision      right    Family History  Problem Relation Age of Onset  . Arthritis Mother   .  Hyperlipidemia Mother   . Hypertension Mother   . Hypertension Father   . Diabetes Father   . Allergies Daughter   . Allergies Son   . Arthritis Maternal Grandmother   . Stroke Maternal Grandmother 50  . Hypertension Maternal Grandmother   . Leukemia Paternal Grandmother   . Coronary artery disease Maternal Aunt 50  . Coronary artery disease Maternal Aunt 50    Allergies  Allergen Reactions  . Amoxicillin     rash  . Ace Inhibitors     REACTION: lip swelling    Current Outpatient Prescriptions on File Prior to Visit  Medication Sig Dispense Refill  . furosemide (LASIX) 20 MG tablet TAKE ONE TABLET BY MOUTH ONCE DAILY AS NEEDED  30 tablet  1  . Multiple Vitamin (MULTIVITAMIN) tablet Take 1 tablet by mouth daily.        . norethindrone-ethinyl estradiol-iron (MICROGESTIN FE,GILDESS FE,LOESTRIN FE) 1.5-30 MG-MCG tablet Take 1 tablet by mouth daily.  1 Package  11   No current facility-administered medications on file prior to visit.    BP 120/88  Pulse  64  Temp(Src) 97.6 F (36.4 C) (Oral)  Resp 16  Ht 5' 4.5" (1.638 m)  Wt 181 lb 0.6 oz (82.119 kg)  BMI 30.61 kg/m2  SpO2 98%    Objective:   Physical Exam  Physical Exam  Constitutional: She is oriented to person, place, and time. She appears well-developed and well-nourished. No distress.  HENT:  Head: Normocephalic and atraumatic.  Right Ear: Tympanic membrane and ear canal normal.  Left Ear: Tympanic membrane and ear canal normal.  Mouth/Throat: Oropharynx is clear and moist.  Eyes: Pupils are equal, round, and reactive to light. No scleral icterus.  Neck: Normal range of motion. No thyromegaly present.  Cardiovascular: Normal rate and regular rhythm.   No murmur heard. Pulmonary/Chest: Effort normal and breath sounds normal. No respiratory distress. He has no wheezes. She has no rales. She exhibits no tenderness.  Abdominal: Soft. Bowel sounds are normal. He exhibits no distension and no mass. There is no  tenderness. There is no rebound and no guarding.  Musculoskeletal: She exhibits no edema.  Lymphadenopathy:    She has no cervical adenopathy.  Neurological: She is alert and oriented to person, place, and time. She has normal reflexes. She exhibits normal muscle tone. Coordination normal.  Skin: Skin is warm and dry. some facial acne is noted Psychiatric: She has a normal mood and affect. Her behavior is normal. Judgment and thought content normal.  Breasts: Examined lying Right: Without masses, retractions, discharge or axillary adenopathy.  Left: Without masses, retractions, discharge or axillary adenopathy.  Inguinal/mons: Normal without inguinal adenopathy  External genitalia: Normal  BUS/Urethra/Skene's glands: Normal  Bladder: Normal  Vagina: Normal  Cervix: surgically absent Uterus: surgically absent Adnexa/parametria:  Rt: Without masses or tenderness.  Lt: Without masses or tenderness.  Anus and perineum: Normal           Assessment & Plan:          Assessment & Plan:

## 2013-02-22 NOTE — Assessment & Plan Note (Signed)
Continue healthy diet, exercise, weight loss efforts.  Obtain fasting lab work.

## 2013-02-22 NOTE — Assessment & Plan Note (Signed)
?   IBS. Will give trial of amitiza. Samples provided.

## 2013-02-23 LAB — URINALYSIS, ROUTINE W REFLEX MICROSCOPIC
Bilirubin Urine: NEGATIVE
Glucose, UA: NEGATIVE mg/dL
Hgb urine dipstick: NEGATIVE
Ketones, ur: NEGATIVE mg/dL
Leukocytes, UA: NEGATIVE
Nitrite: NEGATIVE
Protein, ur: NEGATIVE mg/dL
Specific Gravity, Urine: 1.03 (ref 1.005–1.030)
Urobilinogen, UA: 0.2 mg/dL (ref 0.0–1.0)
pH: 5.5 (ref 5.0–8.0)

## 2013-02-23 LAB — URINALYSIS, MICROSCOPIC ONLY
Bacteria, UA: NONE SEEN
Casts: NONE SEEN
Crystals: NONE SEEN
Squamous Epithelial / LPF: NONE SEEN

## 2013-02-23 LAB — WET PREP BY MOLECULAR PROBE
Candida species: NEGATIVE
Gardnerella vaginalis: NEGATIVE
Trichomonas vaginosis: NEGATIVE

## 2013-02-24 ENCOUNTER — Encounter: Payer: Self-pay | Admitting: Family

## 2013-02-24 DIAGNOSIS — E785 Hyperlipidemia, unspecified: Secondary | ICD-10-CM | POA: Insufficient documentation

## 2013-02-24 NOTE — Assessment & Plan Note (Signed)
Lab Results  Component Value Date   CHOL 244* 02/22/2013   HDL 61 02/22/2013   LDLCALC 175* 02/22/2013   TRIG 42 02/22/2013   CHOLHDL 4.0 02/22/2013   Plan dietary changes, repeat in 6 months.

## 2013-03-08 ENCOUNTER — Other Ambulatory Visit: Payer: Self-pay | Admitting: Family

## 2013-03-21 ENCOUNTER — Ambulatory Visit (INDEPENDENT_AMBULATORY_CARE_PROVIDER_SITE_OTHER): Payer: Managed Care, Other (non HMO) | Admitting: Family

## 2013-03-21 ENCOUNTER — Encounter: Payer: Self-pay | Admitting: Family

## 2013-03-21 VITALS — BP 124/88 | HR 64 | Temp 98.1°F | Resp 16 | Ht 64.5 in | Wt 183.0 lb

## 2013-03-21 DIAGNOSIS — N760 Acute vaginitis: Secondary | ICD-10-CM

## 2013-03-21 NOTE — Progress Notes (Signed)
Pre visit review using our clinic review tool, if applicable. No additional management support is needed unless otherwise documented below in the visit note. 

## 2013-03-21 NOTE — Assessment & Plan Note (Signed)
Wet prep completed today.  Continue monistat. Further recommendations pending wet prep results.

## 2013-03-21 NOTE — Patient Instructions (Signed)
We will contact you with your results.   Continue monistat.

## 2013-03-21 NOTE — Progress Notes (Signed)
Subjective:    Patient ID: Nicole Quinn, female    DOB: 1975/06/05, 39 y.o.   MRN: [id number]  HPI  Nicole Quinn is a 38 yr old female who presents today with chief complaint of  vaginitis since Friday. She reports irritation, pruritis and vaginal discharge. Denies vaginal odor, denies new sexual partners. Started monistat on Friday with some improvement in her vaginal swelling. Review of Systems    see HPI  Past Medical History  Diagnosis Date  . Anemia   . Allergy   . Hypertension   . History of UTI   . Endometriosis     History   Social History  . Marital Status: Single    Spouse Name: N/A    Number of Children: 2  . Years of Education: N/A   Occupational History  .      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 Smoking status: Never Smoker   . Smokeless tobacco: Never Used  . Alcohol Use: No  . Drug Use: Not on file  . Sexual Activity: Not on file   Other Topics Concern  . Not on file   Social History Narrative   Regular exercise: yes.   Lives with one child.  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            Past Surgical History  Procedure Laterality Date  . Abdominal hysterectomy  2003    partial  . Carpal tunnel release      right  . Ganglion cyst excision      right    Family History  Problem Relation Age of Onset  . Arthritis Mother   . Hyperlipidemia Mother   . Hypertension Mother   . Hypertension Father   . Diabetes Father   . Allergies Daughter   . Allergies Son   . Arthritis Maternal Grandmother   . Stroke Maternal Grandmother 50  . Hypertension Maternal Grandmother   . Leukemia Paternal Grandmother   . Coronary artery disease Maternal Aunt 50  . Coronary artery disease Maternal Aunt 50    Allergies  Allergen Reactions  . Amoxicillin     rash  . Ace Inhibitors     REACTION: lip swelling    Current Outpatient Prescriptions on File Prior to Visit  Medication Sig Dispense Refill  . ALPRAZolam  (XANAX) 0.25 MG tablet TAKE ONE TABLET BY MOUTH THREE TIMES DAILY AS NEEDED FOR ANXIETY.  MAY BE USED PRIOR TO BED AS NEEDED FOR SLEEP.  30 tablet  0  . escitalopram (LEXAPRO) 10 MG tablet Take 1 tablet (10 mg total) by mouth daily.  30 tablet  0  . furosemide (LASIX) 20 MG tablet Take 1 tablet (20 mg total) by mouth daily.  30 tablet  2  . hydrochlorothiazide (HYDRODIURIL) 25 MG tablet TAKE ONE TABLET BY MOUTH ONCE DAILY  30 tablet  5  . lubiprostone (AMITIZA) 8 MCG capsule Take 1 capsule (8 mcg total) by mouth 2 (two) times daily with a meal.  32 capsule  0  . metoprolol succinate (TOPROL-XL) 25 MG 24 hr tablet TAKE ONE TABLET BY MOUTH ONCE DAILY  30 tablet  5  . Multiple Vitamin (MULTIVITAMIN) tablet Take 1 tablet by mouth daily.        . norethindrone-ethinyl estradiol-iron (MICROGESTIN FE,GILDESS FE,LOESTRIN FE) 1.5-30 MG-MCG tablet Take 1 tablet by mouth daily.  1 Package  11   No current facility-administered medications on file prior to visit.  BP 124/88  Pulse 64  Temp(Src) 98.1 F (36.7 C) (Oral)  Resp 16  Ht 5' 4.5" (1.638 m)  Wt 183 lb 0.6 oz (83.026 kg)  BMI 30.94 kg/m2    Objective:   Physical Exam  Constitutional: She is oriented to person, place, and time. She appears well-developed and well-nourished.  Genitourinary: There is no rash or lesion on the right labia. There is no rash or lesion on the left labia.  Neurological: She is alert and oriented to person, place, and time.  Psychiatric: She has a normal mood and affect. Her behavior is normal. Judgment and thought content normal.          Assessment & Plan:

## 2013-03-23 ENCOUNTER — Encounter: Payer: Self-pay | Admitting: Family

## 2013-03-23 ENCOUNTER — Telehealth: Payer: Self-pay | Admitting: Family

## 2013-03-23 ENCOUNTER — Ambulatory Visit: Payer: Managed Care, Other (non HMO) | Admitting: Family

## 2013-03-23 NOTE — Telephone Encounter (Signed)
Left message for pt to return my call.

## 2013-03-23 NOTE — Telephone Encounter (Signed)
Notified pt and scheduled appt with PA, Hassell Done on 03/24/13 at 4pm. PA has been made aware of no charge visit.

## 2013-03-23 NOTE — Telephone Encounter (Signed)
Please let pt know that unfortunately, the specimen was misplaced.  If she is still having symptoms, I would ask her to return to the office for repeat swab.  I will not charge her an office visit when she returns for this.  Our apologies for the inconvenience.

## 2013-03-23 NOTE — Telephone Encounter (Signed)
Patient is requesting wet prep results

## 2013-03-23 NOTE — Telephone Encounter (Signed)
Spoke with lab, they have no record of receiving a specimen without an order. Last specimen on record with [facility] is 02/22/13.  Please advise.

## 2013-03-23 NOTE — Telephone Encounter (Signed)
See 03/23/13 phone note.

## 2013-03-23 NOTE — Telephone Encounter (Signed)
The most recent result in EPIC for this is 02/22/13? I do not see a lab order entered for most recent visit 03/21/13. I will check with the lab after 1:30 when they return from lunch.

## 2013-03-24 ENCOUNTER — Other Ambulatory Visit (HOSPITAL_COMMUNITY)
Admission: RE | Admit: 2013-03-24 | Discharge: 2013-03-24 | Disposition: A | Discharge status: Home / Self Care | Payer: Managed Care, Other (non HMO) | Source: Ambulatory Visit | Attending: Physician Assistant | Admitting: Physician Assistant

## 2013-03-24 ENCOUNTER — Ambulatory Visit (INDEPENDENT_AMBULATORY_CARE_PROVIDER_SITE_OTHER): Payer: Managed Care, Other (non HMO) | Admitting: Physician Assistant

## 2013-03-24 ENCOUNTER — Encounter: Payer: Self-pay | Admitting: Physician Assistant

## 2013-03-24 VITALS — BP 116/80 | HR 77 | Temp 98.0°F | Resp 14 | Ht 64.5 in | Wt 183.0 lb

## 2013-03-24 DIAGNOSIS — N760 Acute vaginitis: Secondary | ICD-10-CM | POA: Insufficient documentation

## 2013-03-24 DIAGNOSIS — A499 Bacterial infection, unspecified: Secondary | ICD-10-CM

## 2013-03-24 DIAGNOSIS — Z113 Encounter for screening for infections with a predominantly sexual mode of transmission: Secondary | ICD-10-CM | POA: Insufficient documentation

## 2013-03-24 DIAGNOSIS — B9689 Other specified bacterial agents as the cause of diseases classified elsewhere: Secondary | ICD-10-CM

## 2013-03-24 MED ORDER — METRONIDAZOLE 500 MG PO TABS: 500.0000 mg | ORAL_TABLET | Freq: Three times a day (TID) | ORAL | Status: DC

## 2013-03-24 NOTE — Patient Instructions (Signed)
Please take medication three times a day as prescribe.  Do not drink alcohol while on medication.  I will call you with the results of your wet-prep.  We will change therapy if indicated.  Take a probiotic daily to help prevent recurrence of infection.  Bacterial Vaginosis Bacterial vaginosis is a vaginal infection that occurs when the normal balance of bacteria in the vagina is disrupted. It results from an overgrowth of certain bacteria. This is the most common vaginal infection in women of childbearing age. Treatment is important to prevent complications, especially in pregnant women, as it can cause a premature delivery. CAUSES  Bacterial vaginosis is caused by an increase in harmful bacteria that are normally present in smaller amounts in the vagina. Several different kinds of bacteria can cause bacterial vaginosis. However, the reason that the condition develops is not fully understood. RISK FACTORS Certain activities or behaviors can put you at an increased risk of developing bacterial vaginosis, including:  Having a new sex partner or multiple sex partners.  Douching.  Using an intrauterine device (IUD) for contraception. Women do not get bacterial vaginosis from toilet seats, bedding, swimming pools, or contact with objects around them. SIGNS AND SYMPTOMS  Some women with bacterial vaginosis have no signs or symptoms. Common symptoms include:  Grey vaginal discharge.  A fishlike odor with discharge, especially after sexual intercourse.  Itching or burning of the vagina and vulva.  Burning or pain with urination. DIAGNOSIS  Your health care provider will take a medical history and examine the vagina for signs of bacterial vaginosis. A sample of vaginal fluid may be taken. Your health care provider will look at this sample under a microscope to check for bacteria and abnormal cells. A vaginal pH test may also be done.  TREATMENT  Bacterial vaginosis may be treated with antibiotic  medicines. These may be given in the form of a pill or a vaginal cream. A second round of antibiotics may be prescribed if the condition comes back after treatment.  HOME CARE INSTRUCTIONS   Only take over-the-counter or prescription medicines as directed by your health care provider.  If antibiotic medicine was prescribed, take it as directed. Make sure you finish it even if you start to feel better.  Do not have sex until treatment is completed.  Tell all sexual partners that you have a vaginal infection. They should see their health care provider and be treated if they have problems, such as a mild rash or itching.  Practice safe sex by using condoms and only having one sex partner. SEEK MEDICAL CARE IF:   Your symptoms are not improving after 3 days of treatment.  You have increased discharge or pain.  You have a fever. MAKE SURE YOU:   Understand these instructions.  Will watch your condition.  Will get help right away if you are not doing well or get worse. FOR MORE INFORMATION  Centers for Disease Control and Prevention, Division of STD Prevention: [employer]raiser[employer]raud[employer] American Sexual Health Association (ASHA): www.ashastd.org  Document Released: 01/06/2005 Document Revised: 10/27/2012 Document Reviewed: 08/18/2012 [facility] Patient Information 2014 [place].

## 2013-03-24 NOTE — Progress Notes (Signed)
Pre visit review using our clinic review tool, if applicable. No additional management support is needed unless otherwise documented below in the visit note/SLS  

## 2013-03-24 NOTE — Assessment & Plan Note (Addendum)
Seems most likely giving history, exam and fishy odor.  Will empirically treat with Metronidazole.  Ancillary testing sent to Lab to include -- candidiasis, trichomonas, gardnerella, g/c.  Will call patient with results.  Will alter therapy if indicated. No charge for examination.  Patient is aware she will still have to pay for labs and medication.

## 2013-03-24 NOTE — Progress Notes (Signed)
Patient presents to clinic today for repeat pelvic exam and vaginal wet prep. Patient presented to clinic at the beginning of the week c/o vaginal pruritus, irritation and discharge.  Exam performed and wet prep obtained.  Patient diagnosed with vaginitis but specific etiology was not determined.  Patient seen by NP who deferred treatment until wet prep had resulted due nonspecific exam findings.  Wet prep was not received or processed by lab.  Patient instructed to RTC for repeat exam so diagnosis could be made.  Patient complains of vaginal discomfort, pruritus and discharge.  Denies fever, chills, sweats.  Denies recent intercourse.  Has significant history of bacterial vaginosis.  Patient endorses odor to vaginal discharge.    Past Medical History  Diagnosis Date  . Anemia   . Allergy   . Hypertension   . History of UTI   . Endometriosis     Current Outpatient Prescriptions on File Prior to Visit  Medication Sig Dispense Refill  . ALPRAZolam (XANAX) 0.25 MG tablet TAKE ONE TABLET BY MOUTH THREE TIMES DAILY AS NEEDED FOR ANXIETY.  MAY BE USED PRIOR TO BED AS NEEDED FOR SLEEP.  30 tablet  0  . escitalopram (LEXAPRO) 10 MG tablet Take 1 tablet (10 mg total) by mouth daily.  30 tablet  0  . furosemide (LASIX) 20 MG tablet Take 1 tablet (20 mg total) by mouth daily.  30 tablet  2  . hydrochlorothiazide (HYDRODIURIL) 25 MG tablet TAKE ONE TABLET BY MOUTH ONCE DAILY  30 tablet  5  . lubiprostone (AMITIZA) 8 MCG capsule Take 1 capsule (8 mcg total) by mouth 2 (two) times daily with a meal.  32 capsule  0  . metoprolol succinate (TOPROL-XL) 25 MG 24 hr tablet TAKE ONE TABLET BY MOUTH ONCE DAILY  30 tablet  5  . Multiple Vitamin (MULTIVITAMIN) tablet Take 1 tablet by mouth daily.        . norethindrone-ethinyl estradiol-iron (MICROGESTIN FE,GILDESS FE,LOESTRIN FE) 1.5-30 MG-MCG tablet Take 1 tablet by mouth daily.  1 Package  11   No current facility-administered medications on file prior to visit.     Allergies  Allergen Reactions  . Amoxicillin     rash  . Ace Inhibitors     REACTION: lip swelling    Family History  Problem Relation Age of Onset  . Arthritis Mother   . Hyperlipidemia Mother   . Hypertension Mother   . Hypertension Father   . Diabetes Father   . Allergies Daughter   . Allergies Son   . Arthritis Maternal Grandmother   . Stroke Maternal Grandmother 50  . Hypertension Maternal Grandmother   . Leukemia Paternal Grandmother   . Coronary artery disease Maternal Aunt 50  . Coronary artery disease Maternal Aunt 50    History   Social History  . Marital Status: Single    Spouse Name: N/A    Number of Children: 2  . Years of Education: N/A   Occupational History  .      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 Smoking status: Never Smoker   . Smokeless tobacco: Never Used  . Alcohol Use: No  . Drug Use: None  . Sexual Activity: None   Other Topics Concern  . None   Social History Narrative   Regular exercise: yes.   Lives with one child.  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           Review of  Systems - See HPI.  All other ROS are negative.  BP 116/80  Pulse 77  Temp(Src) 98 F (36.7 C) (Oral)  Resp 14  Ht 5' 4.5" (1.638 m)  Wt 183 lb (83.008 kg)  BMI 30.94 kg/m2  SpO2 99%  Physical Exam  Vitals reviewed. Constitutional: She is oriented to person, place, and time and well-developed, well-nourished, and in no distress.  HENT:  Head: Normocephalic and atraumatic.  Eyes: Conjunctivae are normal. Pupils are equal, round, and reactive to light.  Neck: Neck supple.  Cardiovascular: Normal rate, regular rhythm, normal heart sounds and intact distal pulses.   Pulmonary/Chest: Effort normal and breath sounds normal.  Genitourinary: Cervix normal. Cervix exhibits no motion tenderness and no lesion. Vulva exhibits tenderness. Vulva exhibits no erythema, no exudate, no lesion and no rash. Vagina exhibits no  lesion. Thin  fishy  white and vaginal discharge found.  Neurological: She is alert and oriented to person, place, and time.  Skin: Skin is warm and dry. No rash noted.  Psychiatric: Affect normal.    Recent Results (from the past 2160 hour(s))  BASIC METABOLIC PANEL WITH GFR     Status: None   Collection Time    02/22/13 10:09 AM      Result Value Ref Range   Sodium 139  135 - 145 mEq/L   Potassium 3.5  3.5 - 5.3 mEq/L   Chloride 98  96 - 112 mEq/L   CO2 30  19 - 32 mEq/L   Glucose, Bld 84  70 - 99 mg/dL   BUN 15  6 - 23 mg/dL   Creat 0.80  0.50 - 1.10 mg/dL   Calcium 9.4  8.4 - 10.5 mg/dL   GFR, Est African American >89     GFR, Est Non African American >89     Comment:       The estimated GFR is a calculation valid for adults (>=43 years old)     that uses the CKD-EPI algorithm to adjust for age and sex. It is       not to be used for children, pregnant women, hospitalized patients,        patients on dialysis, or with rapidly changing kidney function.     According to the NKDEP, eGFR >89 is normal, 60-89 shows mild     impairment, 30-59 shows moderate impairment, 15-29 shows severe     impairment and <15 is ESRD.        CBC WITH DIFFERENTIAL     Status: None   Collection Time    02/22/13 10:09 AM      Result Value Ref Range   WBC 6.2  4.0 - 10.5 K/uL   RBC 4.29  3.87 - 5.11 MIL/uL   Hemoglobin 13.2  12.0 - 15.0 g/dL   HCT 38.2  36.0 - 46.0 %   MCV 89.0  78.0 - 100.0 fL   MCH 30.8  26.0 - 34.0 pg   MCHC 34.6  30.0 - 36.0 g/dL   RDW 13.3  11.5 - 15.5 %   Platelets 356  150 - 400 K/uL   Neutrophils Relative % 56  43 - 77 %   Neutro Abs 3.6  1.7 - 7.7 K/uL   Lymphocytes Relative 36  12 - 46 %   Lymphs Abs 2.3  0.7 - 4.0 K/uL   Monocytes Relative 6  3 - 12 %   Monocytes Absolute 0.4  0.1 - 1.0 K/uL   Eosinophils Relative 1  0 - 5 %   Eosinophils Absolute 0.0  0.0 - 0.7 K/uL   Basophils Relative 1  0 - 1 %   Basophils Absolute 0.0  0.0 - 0.1 K/uL   Smear Review  Criteria for review not met    HEPATIC FUNCTION PANEL     Status: None   Collection Time    02/22/13 10:09 AM      Result Value Ref Range   Total Bilirubin 0.5  0.2 - 1.2 mg/dL   Comment: ** Please note change in reference range(s). **   Bilirubin, Direct 0.1  0.0 - 0.3 mg/dL   Indirect Bilirubin 0.4  0.2 - 1.2 mg/dL   Comment: ** Please note change in reference range(s). **   Alkaline Phosphatase 45  39 - 117 U/L   AST 27  0 - 37 U/L   ALT 17  0 - 35 U/L   Total Protein 7.5  6.0 - 8.3 g/dL   Albumin 4.5  3.5 - 5.2 g/dL  LIPID PANEL     Status: Abnormal   Collection Time    02/22/13 10:09 AM      Result Value Ref Range   Cholesterol 244 (*) 0 - 200 mg/dL   Comment: ATP III Classification:           < 200        mg/dL        Desirable          200 - 239     mg/dL        Borderline High          >= 240        mg/dL        High         Triglycerides 42  <150 mg/dL   HDL 61  >39 mg/dL   Total CHOL/HDL Ratio 4.0     VLDL 8  0 - 40 mg/dL   LDL Cholesterol 175 (*) 0 - 99 mg/dL   Comment:       Total Cholesterol/HDL Ratio:CHD Risk                            Coronary Heart Disease Risk Table                                            Men       Women              1/2 Average Risk              3.4        3.3                  Average Risk              5.0        4.4               2X Average Risk              9.6        7.1               3X Average Risk             23.4       11.0  Use the calculated Patient Ratio above and the CHD Risk table      to determine the patient's CHD Risk.     ATP III Classification (LDL):           < 100        mg/dL         Optimal          100 - 129     mg/dL         Near or Above Optimal          130 - 159     mg/dL         Borderline High          160 - 189     mg/dL         High           > 190        mg/dL         Very High        TSH     Status: None   Collection Time    02/22/13 10:09 AM      Result Value Ref Range   TSH 0.350  0.350 -  4.500 uIU/mL  URINALYSIS, ROUTINE W REFLEX MICROSCOPIC     Status: Abnormal   Collection Time    02/22/13 10:09 AM      Result Value Ref Range   Color, Urine YELLOW  YELLOW   APPearance TURBID (*) CLEAR   Specific Gravity, Urine 1.030  1.005 - 1.030   pH 5.5  5.0 - 8.0   Glucose, UA NEG  NEG mg/dL   Bilirubin Urine NEG  NEG   Ketones, ur NEG  NEG mg/dL   Hgb urine dipstick NEG  NEG   Protein, ur NEG  NEG mg/dL   Urobilinogen, UA 0.2  0.0 - 1.0 mg/dL   Nitrite NEG  NEG   Leukocytes, UA NEG  NEG  URINALYSIS, MICROSCOPIC ONLY     Status: None   Collection Time    02/22/13 10:09 AM      Result Value Ref Range   Squamous Epithelial / LPF NONE SEEN  RARE   Crystals NONE SEEN  NONE SEEN   Casts NONE SEEN  NONE SEEN   WBC, UA 0-2  <3 WBC/hpf   RBC / HPF 0-2  <3 RBC/hpf   Bacteria, UA NONE SEEN  RARE  WET PREP BY MOLECULAR PROBE     Status: None   Collection Time    02/22/13 10:54 AM      Result Value Ref Range   Candida species NEG  Negative   Trichomonas vaginosis NEG  Negative   Gardnerella vaginalis NEG  Negative   Assessment/Plan: Bacterial vaginosis Seems most likely giving history, exam and fishy odor.  Will empirically treat with Metronidazole.  Ancillary testing sent to Lab to include -- candidiasis, trichomonas, gardnerella, g/c.  Will call patient with results.  Will alter therapy if indicated. No charge for examination.  Patient is aware she will still have to pay for labs and medication.

## 2013-03-28 LAB — CERVICOVAGINAL ANCILLARY ONLY
Chlamydia: NEGATIVE
Neisseria Gonorrhea: NEGATIVE
Trichomonas: NEGATIVE

## 2013-03-30 ENCOUNTER — Encounter: Payer: Self-pay | Admitting: *Deleted

## 2013-03-30 LAB — CERVICOVAGINAL ANCILLARY ONLY
Bacterial vaginitis: POSITIVE — AB
Candida vaginitis: NEGATIVE

## 2013-04-25 ENCOUNTER — Telehealth: Payer: Self-pay | Admitting: *Deleted

## 2013-04-25 NOTE — Telephone Encounter (Signed)
Pt called back denying chest pain or shortness of breath. Notes increased fluid in her legs and feet. States swelling was some better today.  Please advise.

## 2013-04-25 NOTE — Telephone Encounter (Signed)
Left detailed message on pt's cell # to call and arrange appointment.

## 2013-04-25 NOTE — Telephone Encounter (Signed)
Lets bring her back in to the office please for further evaluation.

## 2013-04-25 NOTE — Telephone Encounter (Signed)
Received message from pt that she has been retaining fluid lately and she is wondering if she should increase her BP med?. Pt states she lost 10 pounds from Friday to Monday.  Attempted to reach pt and left message asking her to call and verify dose of Rxs that she is taking, is she still taking lasix? Also has she been having any shortness of breath or chest pain, is swelling in one extremity?

## 2013-05-14 ENCOUNTER — Other Ambulatory Visit: Payer: Self-pay | Admitting: Family

## 2013-05-20 ENCOUNTER — Ambulatory Visit (INDEPENDENT_AMBULATORY_CARE_PROVIDER_SITE_OTHER): Payer: Managed Care, Other (non HMO) | Admitting: Family

## 2013-05-20 ENCOUNTER — Encounter: Payer: Self-pay | Admitting: Family

## 2013-05-20 VITALS — BP 124/92 | HR 69 | Temp 98.5°F | Resp 16 | Ht 64.5 in | Wt 190.0 lb

## 2013-05-20 DIAGNOSIS — R631 Polydipsia: Secondary | ICD-10-CM

## 2013-05-20 DIAGNOSIS — R635 Abnormal weight gain: Secondary | ICD-10-CM

## 2013-05-20 DIAGNOSIS — R609 Edema, unspecified: Secondary | ICD-10-CM | POA: Insufficient documentation

## 2013-05-20 LAB — BASIC METABOLIC PANEL
BUN: 14 mg/dL (ref 6–23)
CO2: 30 mEq/L (ref 19–32)
Calcium: 9.8 mg/dL (ref 8.4–10.5)
Chloride: 98 mEq/L (ref 96–112)
Creat: 0.92 mg/dL (ref 0.50–1.10)
Glucose, Bld: 75 mg/dL (ref 70–99)
Potassium: 3.5 mEq/L (ref 3.5–5.3)
Sodium: 138 mEq/L (ref 135–145)

## 2013-05-20 LAB — HEMOGLOBIN A1C
Hgb A1c MFr Bld: 5.5 % (ref <5.7)
Mean Plasma Glucose: 111 mg/dL (ref <117)

## 2013-05-20 NOTE — Assessment & Plan Note (Signed)
Could be related to diuretic use.  Obtain glucose and a1C.  If normal, consider backing off some on the diuretics as tolerated.

## 2013-05-20 NOTE — Patient Instructions (Signed)
Please complete lab work prior to leaving. You will be contacted about your echocardiogram. Follow up in 3 months, sooner if problems/concerns.

## 2013-05-20 NOTE — Assessment & Plan Note (Signed)
Continue lasix/hctz for now. Obtain 2D echo to assess cardiac function.

## 2013-05-20 NOTE — Progress Notes (Signed)
Subjective:    Patient ID: Nicole Quinn, female    DOB: 12-25-1975, 38 y.o.   MRN: [id number]  HPI  [name] is a 38 yr old female who presents today to discuss edema.  Reports Edema is worse at the end of the day.She has gained 7 pounds since her last visit in March.  She reports + polydipsia. Feels like these symptoms worsened about 3 weeks ago. Reports that she is taking both hctz and lasix.  She reports some sob with walking.  Works out 5-6 days a week with cardio work out.   Wt Readings from Last 3 Encounters:  05/20/13 190 lb (86.183 kg)  03/24/13 183 lb (83.008 kg)  03/21/13 183 lb 0.6 oz (83.026 kg)    BP Readings from Last 3 Encounters:  05/20/13 124/92  03/24/13 116/80  03/21/13 124/88    Review of Systems See HPI  Past Medical History  Diagnosis Date  . Anemia   . Allergy   . Hypertension   . History of UTI   . Endometriosis     History   Social History  . Marital Status: Single    Spouse Name: N/A    Number of Children: 2  . Years of Education: N/A   Occupational History  .      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 Smoking status: Never Smoker   . Smokeless tobacco: Never Used  . Alcohol Use: No  . Drug Use: Not on file  . Sexual Activity: Not on file   Other Topics Concern  . Not on file   Social History Narrative   Regular exercise: yes.   Lives with one child.  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            Past Surgical History  Procedure Laterality Date  . Abdominal hysterectomy  2003    partial  . Carpal tunnel release      right  . Ganglion cyst excision      right    Family History  Problem Relation Age of Onset  . Arthritis Mother   . Hyperlipidemia Mother   . Hypertension Mother   . Hypertension Father   . Diabetes Father   . Allergies Daughter   . Allergies Son   . Arthritis Maternal Grandmother   . Stroke Maternal Grandmother 50  . Hypertension Maternal Grandmother   .  Leukemia Paternal Grandmother   . Coronary artery disease Maternal Aunt 50  . Coronary artery disease Maternal Aunt 50    Allergies  Allergen Reactions  . Amoxicillin     rash  . Ace Inhibitors     REACTION: lip swelling    Current Outpatient Prescriptions on File Prior to Visit  Medication Sig Dispense Refill  . ALPRAZolam (XANAX) 0.25 MG tablet TAKE ONE TABLET BY MOUTH THREE TIMES DAILY AS NEEDED FOR ANXIETY.  MAY BE USED PRIOR TO BED AS NEEDED FOR SLEEP.  30 tablet  0  . escitalopram (LEXAPRO) 10 MG tablet Take 1 tablet (10 mg total) by mouth daily.  30 tablet  0  . furosemide (LASIX) 20 MG tablet TAKE ONE TABLET BY MOUTH ONCE DAILY  30 tablet  0  . hydrochlorothiazide (HYDRODIURIL) 25 MG tablet TAKE ONE TABLET BY MOUTH ONCE DAILY  30 tablet  5  . lubiprostone (AMITIZA) 8 MCG capsule Take 1 capsule (8 mcg total) by mouth 2 (two) times daily with a meal.  32 capsule  0  . metoprolol succinate (TOPROL-XL) 25 MG 24 hr tablet TAKE ONE TABLET BY MOUTH ONCE DAILY  30 tablet  5  . metroNIDAZOLE (FLAGYL) 500 MG tablet Take 1 tablet (500 mg total) by mouth 3 (three) times daily.  21 tablet  0  . Multiple Vitamin (MULTIVITAMIN) tablet Take 1 tablet by mouth daily.        . norethindrone-ethinyl estradiol-iron (MICROGESTIN FE,GILDESS FE,LOESTRIN FE) 1.5-30 MG-MCG tablet Take 1 tablet by mouth daily.  1 Package  11   No current facility-administered medications on file prior to visit.    BP 124/92  Pulse 69  Temp(Src) 98.5 F (36.9 C) (Oral)  Resp 16  Ht 5' 4.5" (1.638 m)  Wt 190 lb (86.183 kg)  BMI 32.12 kg/m2  SpO2 100%       Objective:   Physical Exam  Constitutional: She is oriented to person, place, and time. She appears well-developed and well-nourished. No distress.  Cardiovascular: Normal rate and regular rhythm.   No murmur heard. Pulmonary/Chest: Effort normal and breath sounds normal. No respiratory distress. She has no wheezes. She has no rales. She exhibits no  tenderness.  Musculoskeletal:  Trace bilateral LE edema  Neurological: She is alert and oriented to person, place, and time.  Psychiatric: She has a normal mood and affect. Her behavior is normal. Judgment and thought content normal.          Assessment & Plan:

## 2013-05-20 NOTE — Assessment & Plan Note (Signed)
Continue healthy diet, exercise

## 2013-05-23 ENCOUNTER — Encounter: Payer: Self-pay | Admitting: Family

## 2013-05-27 ENCOUNTER — Telehealth: Payer: Self-pay | Admitting: *Deleted

## 2013-05-27 NOTE — Telephone Encounter (Signed)
Pt left message requesting test results from last Friday. Advised pt we mailed letter to her this week and all results were normal.

## 2013-06-01 ENCOUNTER — Ambulatory Visit (HOSPITAL_BASED_OUTPATIENT_CLINIC_OR_DEPARTMENT_OTHER): Payer: Managed Care, Other (non HMO)

## 2013-06-06 ENCOUNTER — Telehealth: Payer: Self-pay | Admitting: *Deleted

## 2013-06-06 MED ORDER — ESCITALOPRAM OXALATE 10 MG PO TABS: 10.0000 mg | ORAL_TABLET | Freq: Every day | ORAL | Status: DC

## 2013-06-06 NOTE — Telephone Encounter (Signed)
OK to send #30 with 1 refill.  Please ask her how she if feeling since we changed her to lexapro.

## 2013-06-06 NOTE — Telephone Encounter (Signed)
Pt left message requesting refill of lexapro.  Last rx sent 02/2013.  Please advise.

## 2013-06-06 NOTE — Telephone Encounter (Signed)
Refill sent. Left detailed message on cell re: Rx completion and to call and let us know how she has been doing on Lexapro.

## 2013-06-07 NOTE — Telephone Encounter (Signed)
Pt called back stating she has been feeling ok on the lexapro and will let us know if anything changes.

## 2013-06-09 ENCOUNTER — Other Ambulatory Visit: Payer: Self-pay | Admitting: Family

## 2013-06-09 NOTE — Telephone Encounter (Signed)
Rx request to pharmacy/SLS  

## 2013-06-10 ENCOUNTER — Ambulatory Visit: Payer: PRIVATE HEALTH INSURANCE | Admitting: Podiatry

## 2013-06-14 ENCOUNTER — Ambulatory Visit (INDEPENDENT_AMBULATORY_CARE_PROVIDER_SITE_OTHER): Payer: PRIVATE HEALTH INSURANCE | Admitting: Podiatry

## 2013-06-14 ENCOUNTER — Encounter: Payer: Self-pay | Admitting: Podiatry

## 2013-06-14 VITALS — BP 130/92 | HR 63 | Ht 64.5 in | Wt 190.0 lb

## 2013-06-14 DIAGNOSIS — M2040 Other hammer toe(s) (acquired), unspecified foot: Secondary | ICD-10-CM

## 2013-06-14 DIAGNOSIS — M79609 Pain in unspecified limb: Secondary | ICD-10-CM

## 2013-06-14 DIAGNOSIS — M79606 Pain in leg, unspecified: Secondary | ICD-10-CM

## 2013-06-14 NOTE — Progress Notes (Signed)
2nd on right, 3, 4, 5 toes on left.  SUBJECTIVE: 38 y.o. year old female presents complaining of painful corns on both feet for over 5 years. She has had previous hammer toe surgeries on both feet 10 years ago. Those same toes have corns and hurts in shoes. Patient request these corns removed.   OBJECTIVE: DERMATOLOGIC EXAMINATION: Normal nails. Digital corns at DIPJ on 2nd right, 3rd and 4th left, and PIPJ 5th digt left. VASCULAR EXAMINATION OF LOWER LIMBS: Pedal pulses: All pedal pulses are palpable with normal pulsation.  Capillary Filling times within 3 seconds in all digits.  Temperature gradient from tibial crest to dorsum of foot is within normal bilateral. NEUROLOGIC EXAMINATION OF THE LOWER LIMBS: All epicritic and tactile sensations grossly intact.  MUSCULOSKELETAL EXAMINATION: Mild Hallux valgus with bunion bilateral. Status post digital surgery with fused IPJ 2-4 bilateral.  Contracted DIPJ 2nd right, 3rd and 4th left, contracted PIPJ 5th left. RADIOGRAPHIC STUDIES:  Multiple digital contractures bilateral. Post surgical phalangeal head resection 2-4 bilateral.   ASSESSMENT: Painful hammer toes with digital corns 2nd right, 3-5 left.   PLAN: Reviewed clinical findings and available options. Patient request surgical options. Surgery consent from for Hammer toe repair 3td, 4th, and 5th left foot reviewed and signed. Also reviewed possible recurrence and failure of procedures, and possible complications.

## 2013-06-14 NOTE — Patient Instructions (Signed)
Seen for painful corn. Surgery scheduled for 3rd, 4th, and 5th digit left foot. Call if there is any questions.

## 2013-06-15 DIAGNOSIS — M2040 Other hammer toe(s) (acquired), unspecified foot: Secondary | ICD-10-CM | POA: Insufficient documentation

## 2013-06-15 DIAGNOSIS — M79606 Pain in leg, unspecified: Secondary | ICD-10-CM | POA: Insufficient documentation

## 2013-06-28 ENCOUNTER — Other Ambulatory Visit: Payer: Self-pay | Admitting: Family

## 2013-07-01 ENCOUNTER — Telehealth: Payer: Self-pay | Admitting: *Deleted

## 2013-07-01 NOTE — Telephone Encounter (Signed)
Attempted to reach pt at work and cell number- no answer.  We definitely can consider adjusting her medications.  However, I think she needs to been seen in the office.  Please confirm no SI/HI and arrange follow up appointment.

## 2013-07-01 NOTE — Telephone Encounter (Signed)
Pt left message that she has been having increased depression, crying and weight gain. Wants to know if Lexapro can be adjusted.  Please advise.

## 2013-07-01 NOTE — Telephone Encounter (Signed)
Left detailed message on cell# re: below recommendations. No available openings in the office today and per verbal from Provider ok to wait until early next week. Left message advising pt to call the office to arrange appt and if she is having suicidal or homicidal thoughts she should be evaluated in the ER right away. Per Provider, medication adjustment may not be sufficient; may need to change to different medication.

## 2013-07-06 ENCOUNTER — Ambulatory Visit: Payer: Managed Care, Other (non HMO) | Admitting: Physician Assistant

## 2013-07-07 ENCOUNTER — Encounter: Payer: Self-pay | Admitting: Physician Assistant

## 2013-07-07 ENCOUNTER — Ambulatory Visit (INDEPENDENT_AMBULATORY_CARE_PROVIDER_SITE_OTHER): Payer: Managed Care, Other (non HMO) | Admitting: Physician Assistant

## 2013-07-07 VITALS — BP 124/86 | HR 78 | Temp 98.1°F | Resp 16 | Ht 64.5 in | Wt 196.0 lb

## 2013-07-07 DIAGNOSIS — R609 Edema, unspecified: Secondary | ICD-10-CM

## 2013-07-07 DIAGNOSIS — F32A Depression, unspecified: Secondary | ICD-10-CM

## 2013-07-07 DIAGNOSIS — F419 Anxiety disorder, unspecified: Principal | ICD-10-CM

## 2013-07-07 DIAGNOSIS — F341 Dysthymic disorder: Secondary | ICD-10-CM

## 2013-07-07 DIAGNOSIS — F418 Other specified anxiety disorders: Secondary | ICD-10-CM

## 2013-07-07 DIAGNOSIS — F329 Major depressive disorder, single episode, unspecified: Secondary | ICD-10-CM

## 2013-07-07 MED ORDER — BUPROPION HCL ER (SR) 150 MG PO TB12: 150.0000 mg | ORAL_TABLET | Freq: Two times a day (BID) | ORAL | Status: DC

## 2013-07-07 MED ORDER — FUROSEMIDE 20 MG PO TABS: ORAL_TABLET | ORAL | Status: DC

## 2013-07-07 MED ORDER — ALPRAZOLAM 0.25 MG PO TABS
ORAL_TABLET | ORAL | Status: DC
Start: 2013-07-07 — End: 2013-08-12

## 2013-07-07 NOTE — Patient Instructions (Signed)
Please start taking the Lexapro every other day for 1 week.  Then begin the Wellbutrin as directed -- take 1 tablet daily for 2-3 days.  Then increase to 1 tablet by mouth twice daily.  Follow-up with Melissa in 1 month. IF you develop worsening depressive symptoms or suicidal thought, please call the office immediately.

## 2013-07-10 NOTE — Progress Notes (Signed)
Patient presents to clinic today to discuss medication management for antidepressant medications.  Patient currently on Lexapro 10 mg daily.  Denies improvement in anxiety or mood. Denies SI/HI.  Also endorses significant weight gain with medication despite diet and exercise.  Is requesting other option for symptom relief.  Past Medical History  Diagnosis Date  . Anemia   . Allergy   . Hypertension   . History of UTI   . Endometriosis     Current Outpatient Prescriptions on File Prior to Visit  Medication Sig Dispense Refill  . hydrochlorothiazide (HYDRODIURIL) 25 MG tablet TAKE ONE TABLET BY MOUTH ONCE DAILY  30 tablet  5  . lubiprostone (AMITIZA) 8 MCG capsule Take 1 capsule (8 mcg total) by mouth 2 (two) times daily with a meal.  32 capsule  0  . metoprolol succinate (TOPROL-XL) 25 MG 24 hr tablet TAKE ONE TABLET BY MOUTH ONCE DAILY  30 tablet  5  . Multiple Vitamin (MULTIVITAMIN) tablet Take 1 tablet by mouth daily.        . norethindrone-ethinyl estradiol-iron (MICROGESTIN FE,GILDESS FE,LOESTRIN FE) 1.5-30 MG-MCG tablet Take 1 tablet by mouth daily.  1 Package  11   No current facility-administered medications on file prior to visit.    Allergies  Allergen Reactions  . Amoxicillin     rash  . Ace Inhibitors     REACTION: lip swelling    Family History  Problem Relation Age of Onset  . Arthritis Mother   . Hyperlipidemia Mother   . Hypertension Mother   . Hypertension Father   . Diabetes Father   . Allergies Daughter   . Allergies Son   . Arthritis Maternal Grandmother   . Stroke Maternal Grandmother 50  . Hypertension Maternal Grandmother   . Leukemia Paternal Grandmother   . Coronary artery disease Maternal Aunt 50  . Coronary artery disease Maternal Aunt 50    History   Social History  . Marital Status: Single    Spouse Name: N/A    Number of Children: 2  . Years of Education: N/A   Occupational History  .      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 Smoking status: Never Smoker   . Smokeless tobacco: Never Used  . Alcohol Use: No  . Drug Use: None  . Sexual Activity: None   Other Topics Concern  . None   Social History Narrative   Regular exercise: yes.   Lives with one child.  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           Review of Systems - See HPI.  All other ROS are negative.  BP 124/86  Pulse 78  Temp(Src) 98.1 F (36.7 C) (Oral)  Resp 16  Ht 5' 4.5" (1.638 m)  Wt 196 lb (88.905 kg)  BMI 33.14 kg/m2  SpO2 98%  Physical Exam  Constitutional: She is oriented to person, place, and time and well-developed, well-nourished, and in no distress.  HENT:  Head: Normocephalic and atraumatic.  Eyes: Conjunctivae are normal.  Cardiovascular: Normal rate, regular rhythm, normal heart sounds and intact distal pulses.   Pulmonary/Chest: Effort normal and breath sounds normal. No respiratory distress.  Neurological: She is alert and oriented to person, place, and time.  Skin: Skin is warm and dry. No rash noted.  Psychiatric: Affect normal.    Recent Results (from the past 2160 hour(s))  HEMOGLOBIN A1C     Status: None  Collection Time    05/20/13  9:18 AM      Result Value Ref Range   Hemoglobin A1C 5.5  <5.7 %   Comment:                                                                            According to the ADA Clinical Practice Recommendations for 2011, when     HbA1c is used as a screening test:             >=6.5%   Diagnostic of Diabetes Mellitus                (if abnormal result is confirmed)           5.7-6.4%   Increased risk of developing Diabetes Mellitus           References:Diagnosis and Classification of Diabetes Mellitus,Diabetes     PYKD,9833,82(NKNLZ 1):S62-S69 and Standards of Medical Care in             Diabetes - 2011,Diabetes Care,2011,34 (Suppl 1):S11-S61.         Mean Plasma Glucose 111  <117 mg/dL  BASIC METABOLIC PANEL     Status: None    Collection Time    05/20/13  9:18 AM      Result Value Ref Range   Sodium 138  135 - 145 mEq/L   Potassium 3.5  3.5 - 5.3 mEq/L   Chloride 98  96 - 112 mEq/L   CO2 30  19 - 32 mEq/L   Glucose, Bld 75  70 - 99 mg/dL   BUN 14  6 - 23 mg/dL   Creat 0.92  0.50 - 1.10 mg/dL   Calcium 9.8  8.4 - 10.5 mg/dL    Assessment/Plan: Edema Medication refilled.  Anxiety associated with depression Will titrate off of Lexapro following instructions and begin Wellbutrin SR.  Continue Xanax as directed.  Follow-up with PCP in 3-4 weeks.

## 2013-07-10 NOTE — Assessment & Plan Note (Signed)
>>  ASSESSMENT AND PLAN FOR ANXIETY ASSOCIATED WITH DEPRESSION WRITTEN ON 07/10/2013  8:44 PM BY Waldon MerlMARTIN, WILLIAM C, PA-C  Will titrate off of Lexapro following instructions and begin Wellbutrin SR.  Continue Xanax as directed.  Follow-up with PCP in 3-4 weeks.

## 2013-07-10 NOTE — Assessment & Plan Note (Signed)
Medication refilled

## 2013-07-10 NOTE — Assessment & Plan Note (Signed)
Will titrate off of Lexapro following instructions and begin Wellbutrin SR.  Continue Xanax as directed.  Follow-up with PCP in 3-4 weeks.

## 2013-07-20 ENCOUNTER — Other Ambulatory Visit: Payer: Self-pay | Admitting: Family

## 2013-07-29 ENCOUNTER — Other Ambulatory Visit: Payer: Self-pay | Admitting: Physician Assistant

## 2013-07-29 NOTE — Telephone Encounter (Signed)
Pt due for follow up of depression anxiety on or after 08/06/13.  Please call pt to arrange appt.

## 2013-08-01 NOTE — Telephone Encounter (Signed)
Left message for patient to return my call.

## 2013-08-01 NOTE — Telephone Encounter (Signed)
Appointment scheduled for 08/12/13.

## 2013-08-10 ENCOUNTER — Other Ambulatory Visit: Payer: Self-pay | Admitting: Physician Assistant

## 2013-08-11 ENCOUNTER — Telehealth: Payer: Self-pay | Admitting: *Deleted

## 2013-08-11 NOTE — Telephone Encounter (Signed)
Faxed refill request received from [facility] for Metronidazole 500 mg Last filled by MD on 03.05.15, #[id number] Last AEX - 04.25.15 [06.18.15 Acute w/[facility] for Depression/Anxiety: to f/u in 1 mth] Next AEX - 3-Mths [has appt scheduled for 07.24.15 at 2:00pm] Please Advise on refills/SLS

## 2013-08-12 ENCOUNTER — Ambulatory Visit (INDEPENDENT_AMBULATORY_CARE_PROVIDER_SITE_OTHER): Payer: Managed Care, Other (non HMO) | Admitting: Family

## 2013-08-12 ENCOUNTER — Encounter: Payer: Self-pay | Admitting: Family

## 2013-08-12 VITALS — BP 130/92 | HR 66 | Temp 98.2°F | Resp 16 | Ht 64.5 in | Wt 202.0 lb

## 2013-08-12 DIAGNOSIS — N760 Acute vaginitis: Secondary | ICD-10-CM

## 2013-08-12 DIAGNOSIS — F419 Anxiety disorder, unspecified: Principal | ICD-10-CM

## 2013-08-12 DIAGNOSIS — F32A Depression, unspecified: Secondary | ICD-10-CM

## 2013-08-12 DIAGNOSIS — F329 Major depressive disorder, single episode, unspecified: Secondary | ICD-10-CM

## 2013-08-12 DIAGNOSIS — F418 Other specified anxiety disorders: Secondary | ICD-10-CM

## 2013-08-12 DIAGNOSIS — F341 Dysthymic disorder: Secondary | ICD-10-CM

## 2013-08-12 DIAGNOSIS — E669 Obesity, unspecified: Secondary | ICD-10-CM | POA: Insufficient documentation

## 2013-08-12 DIAGNOSIS — I10 Essential (primary) hypertension: Secondary | ICD-10-CM

## 2013-08-12 MED ORDER — ALPRAZOLAM 0.25 MG PO TABS: ORAL_TABLET | ORAL | Status: DC

## 2013-08-12 MED ORDER — METRONIDAZOLE 500 MG PO TABS: 500.0000 mg | ORAL_TABLET | Freq: Two times a day (BID) | ORAL | Status: DC

## 2013-08-12 NOTE — Assessment & Plan Note (Signed)
Discussed continuing healthy diet, exercise.  Unfortunately, may need to bring her calories down to 1000 to see weight loss.

## 2013-08-12 NOTE — Addendum Note (Signed)
Addended by: Kelle Darting A on: 08/12/2013 04:20 PM   Modules accepted: Orders

## 2013-08-12 NOTE — Patient Instructions (Signed)
Continue wellbutrin. Start metronidzole. Follow up in 3 months.

## 2013-08-12 NOTE — Progress Notes (Signed)
Pre visit review using our clinic review tool, if applicable. No additional management support is needed unless otherwise documented below in the visit note. 

## 2013-08-12 NOTE — Assessment & Plan Note (Signed)
Follow up BP ok today. Monitor on current meds.

## 2013-08-12 NOTE — Assessment & Plan Note (Signed)
Wet prep obtained. Hx of recurrent BV, start empiric metronidazole.

## 2013-08-12 NOTE — Assessment & Plan Note (Signed)
Improved on wellbutrin, continue same.  Refill xanax.

## 2013-08-12 NOTE — Progress Notes (Signed)
Subjective:    Patient ID: Nicole Quinn, female    DOB: [birth year], 38 y.o.   MRN: [id number]  HPI  Depression/anxiety-  Maintained on prn xanax.    Vaginal itching- itching and odor x 4 days with associated discharge.   HTN-  On furosemide prn- uses every other day prn swelling.  She continues daily hctz and toprol xl.   Has gained weight despite eating only 1200 calories a day and exercising regularly. Body mass index is 34.15 kg/(m^2).  Wt Readings from Last 3 Encounters:  08/12/13 202 lb (91.627 kg)  07/07/13 196 lb (88.905 kg)  06/14/13 190 lb (86.183 kg)    Wt Readings from Last 3 Encounters:  08/12/13 202 lb (91.627 kg)  07/07/13 196 lb (88.905 kg)  06/14/13 190 lb (86.183 kg)   Depression/anxiety- overall notes feeling better on wellbutrin. She would like a refill on xanax.    Review of Systems See HPI  Past Medical History  Diagnosis Date  . Anemia   . Allergy   . Hypertension   . History of UTI   . Endometriosis     History   Social History  . Marital Status: Single    Spouse Name: N/A    Number of Children: 2  . Years of Education: N/A   Occupational History  .      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 Smoking status: Never Smoker   . Smokeless tobacco: Never Used  . Alcohol Use: No  . Drug Use: Not on file  . Sexual Activity: Not on file   Other Topics Concern  . Not on file   Social History Narrative   Regular exercise: yes.   Lives with one child.  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            Past Surgical History  Procedure Laterality Date  . Abdominal hysterectomy  2003    partial  . Carpal tunnel release      right  . Ganglion cyst excision      right    Family History  Problem Relation Age of Onset  . Arthritis Mother   . Hyperlipidemia Mother   . Hypertension Mother   . Hypertension Father   . Diabetes Father   . Allergies Daughter   . Allergies Son   . Arthritis Maternal  Grandmother   . Stroke Maternal Grandmother 50  . Hypertension Maternal Grandmother   . Leukemia Paternal Grandmother   . Coronary artery disease Maternal Aunt 50  . Coronary artery disease Maternal Aunt 50    Allergies  Allergen Reactions  . Amoxicillin     rash  . Ace Inhibitors     REACTION: lip swelling    Current Outpatient Prescriptions on File Prior to Visit  Medication Sig Dispense Refill  . ALPRAZolam (XANAX) 0.25 MG tablet TAKE ONE TABLET BY MOUTH THREE TIMES DAILY AS NEEDED FOR ANXIETY.  MAY BE USED PRIOR TO BED AS NEEDED FOR SLEEP.  30 tablet  0  . buPROPion (WELLBUTRIN SR) 150 MG 12 hr tablet Take 1 tablet (150 mg total) by mouth 2 (two) times daily.  60 tablet  1  . furosemide (LASIX) 20 MG tablet TAKE ONE TABLET BY MOUTH ONCE DAILY  30 tablet  2  . hydrochlorothiazide (HYDRODIURIL) 25 MG tablet TAKE ONE TABLET BY MOUTH ONCE DAILY  30 tablet  3  . metoprolol succinate (TOPROL-XL) 25 MG 24 hr tablet TAKE  ONE TABLET BY MOUTH ONCE DAILY  30 tablet  3  . metroNIDAZOLE (FLAGYL) 500 MG tablet Take 1 tablet (500 mg total) by mouth 2 (two) times daily.  14 tablet  0  . Multiple Vitamin (MULTIVITAMIN) tablet Take 1 tablet by mouth daily.         No current facility-administered medications on file prior to visit.    BP 140/80  Pulse 66  Temp(Src) 98.2 F (36.8 C) (Oral)  Resp 16  Ht 5' 4.5" (1.638 m)  Wt 202 lb (91.627 kg)  BMI 34.15 kg/m2  SpO2 99%       Objective:   Physical Exam  Constitutional: She is oriented to person, place, and time. She appears well-developed and well-nourished. No distress.  Cardiovascular: Normal rate and regular rhythm.   No murmur heard. Pulmonary/Chest: Effort normal and breath sounds normal. No respiratory distress. She has no wheezes. She has no rales. She exhibits no tenderness.  Genitourinary:  Slight white vaginal discharge noted at introitus  Musculoskeletal: She exhibits no edema.  Neurological: She is alert and oriented to  person, place, and time.          Assessment & Plan:

## 2013-08-12 NOTE — Assessment & Plan Note (Signed)
>>  ASSESSMENT AND PLAN FOR ANXIETY ASSOCIATED WITH DEPRESSION WRITTEN ON 08/12/2013  2:36 PM BY O'SULLIVAN, Nimra Puccinelli, NP  Improved on wellbutrin, continue same.  Refill xanax.

## 2013-08-13 LAB — WET PREP BY MOLECULAR PROBE
Candida species: NEGATIVE
Gardnerella vaginalis: POSITIVE — AB
Trichomonas vaginosis: NEGATIVE

## 2013-08-31 ENCOUNTER — Telehealth: Payer: Self-pay | Admitting: Family

## 2013-08-31 NOTE — Telephone Encounter (Signed)
Refill called to pharmacy voicemail and left detailed message on pt's cell# to call with response to below questions.

## 2013-08-31 NOTE — Telephone Encounter (Signed)
Please contact pt and ask her how often she is using xanax.  Has her anxiety worsened since last visit?  If so, we should schedule her a follow up and ok to provide 1 refill.

## 2013-08-31 NOTE — Telephone Encounter (Signed)
Pt last seen on 08/12/13 and advised follow up in 3 months. Rx last printed 08/12/13. Please advise.  Medication name:  Name from pharmacy:  ALPRAZolam (XANAX) 0.25 MG tablet  ALPRAZolam 0.25MG  TAB Sig: TAKE ONE TABLET BY MOUTH THREE TIMES DAILY AS NEEDED FOR ANXIETY Dispense: 30 tablet Refills: 0 Start: 08/31/2013 Class: Normal Requested on: 08/12/2013 Originally ordered on: 01/26/2012 Last refill: 08/12/2013

## 2013-09-02 NOTE — Telephone Encounter (Signed)
Patient left message stating her depression and anxiety have gotten worse recently and she is taking the xanax more often, she did not state how often she was having to take the medication, she can be reached back at [phone]

## 2013-09-02 NOTE — Telephone Encounter (Signed)
Left detailed message on cell# to return my call re: below questions.

## 2013-09-02 NOTE — Telephone Encounter (Signed)
Steph, please call pt to arrange follow up with Melissa to discuss her anxiety.

## 2013-09-06 ENCOUNTER — Other Ambulatory Visit: Payer: Self-pay | Admitting: Physician Assistant

## 2013-09-06 NOTE — Telephone Encounter (Signed)
Appointment scheduled for 09/12/13 with Melissa. I offered patient earlier appointment with [facility] but patient declined.

## 2013-09-06 NOTE — Telephone Encounter (Signed)
Left message for patient to return my call.

## 2013-09-12 ENCOUNTER — Encounter: Payer: Self-pay | Admitting: Family

## 2013-09-12 ENCOUNTER — Ambulatory Visit (INDEPENDENT_AMBULATORY_CARE_PROVIDER_SITE_OTHER): Payer: Managed Care, Other (non HMO) | Admitting: Family

## 2013-09-12 VITALS — BP 138/94 | HR 67 | Temp 98.2°F | Resp 16 | Ht 64.5 in | Wt 198.0 lb

## 2013-09-12 DIAGNOSIS — Z23 Encounter for immunization: Secondary | ICD-10-CM

## 2013-09-12 NOTE — Progress Notes (Signed)
Pre visit review using our clinic review tool, if applicable. No additional management support is needed unless otherwise documented below in the visit note. 

## 2013-09-13 ENCOUNTER — Telehealth: Payer: Self-pay | Admitting: *Deleted

## 2013-09-13 NOTE — Telephone Encounter (Signed)
Left detailed message on pt's cell, that if she is having continued pain, needs to schedule sooner office visit for evaluation. If pain is completely resolved, we can discuss further at her appointment on 9/1.

## 2013-09-13 NOTE — Telephone Encounter (Signed)
Pt left message that she had an episode of "intense lower left side pain" yesterday evening that lasted about 1 hour. Wonders if this could be coming from her ovaries and wants to know if we can order an u/s?  Pt has a f/u with [place] on 09/20/13.  Please advise.

## 2013-09-15 NOTE — Progress Notes (Signed)
Patient left without being seen as she had to pick up her child.

## 2013-09-20 ENCOUNTER — Ambulatory Visit: Payer: Managed Care, Other (non HMO) | Admitting: Family

## 2013-09-20 ENCOUNTER — Encounter: Payer: Self-pay | Admitting: Family

## 2013-09-20 ENCOUNTER — Ambulatory Visit (INDEPENDENT_AMBULATORY_CARE_PROVIDER_SITE_OTHER): Payer: Managed Care, Other (non HMO) | Admitting: Family

## 2013-09-20 VITALS — BP 124/78 | HR 68 | Temp 97.8°F | Resp 14 | Ht 64.5 in | Wt 192.2 lb

## 2013-09-20 DIAGNOSIS — F341 Dysthymic disorder: Secondary | ICD-10-CM

## 2013-09-20 DIAGNOSIS — F418 Other specified anxiety disorders: Secondary | ICD-10-CM

## 2013-09-20 DIAGNOSIS — E669 Obesity, unspecified: Secondary | ICD-10-CM

## 2013-09-20 MED ORDER — VENLAFAXINE HCL ER 37.5 MG PO CP24: ORAL_CAPSULE | ORAL | Status: DC

## 2013-09-20 NOTE — Progress Notes (Signed)
Pre visit review using our clinic review tool, if applicable. No additional management support is needed unless otherwise documented below in the visit note. 

## 2013-09-20 NOTE — Assessment & Plan Note (Addendum)
deteriorated- depression>anxiety. Will add effexor. We did discuss rare/serious risk of serotonin syndrome. She is instructed to go to the ED if she develops SI/HI and she verbalizes understanding. 15 minutes spent with pt.  >50% of this time was spent counseling pt on anxiety/depression

## 2013-09-20 NOTE — Progress Notes (Signed)
Subjective:    Patient ID: Nicole Quinn, female    DOB: 1975/06/16, 38 y.o.   MRN: [id number]  HPI  Anxiety-  Reports increased aggitation, worry. "any little thing can set me off." Feels unmotivated wants to sleep all the time. However at night she "can't sleep" at night. Taking xanax twice daily.  Xanax does help with her symptoms of anxiety.   Wt Readings from Last 3 Encounters:  09/20/13 192 lb 3.2 oz (87.181 kg)  09/12/13 198 lb (89.812 kg)  08/12/13 202 lb (91.627 kg)   She is working hard on healthy diet and exercise.  She has lost 10 pounds since July.   Review of Systems See HPI  Past Medical History  Diagnosis Date  . Anemia   . Allergy   . Hypertension   . History of UTI   . Endometriosis     History   Social History  . Marital Status: Single    Spouse Name: N/A    Number of Children: 2  . Years of Education: N/A   Occupational History  .      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 Smoking status: Never Smoker   . Smokeless tobacco: Never Used  . Alcohol Use: No  . Drug Use: Not on file  . Sexual Activity: Not on file   Other Topics Concern  . Not on file   Social History Narrative   Regular exercise: yes.   Lives with one child.  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            Past Surgical History  Procedure Laterality Date  . Abdominal hysterectomy  2003    partial  . Carpal tunnel release      right  . Ganglion cyst excision      right    Family History  Problem Relation Age of Onset  . Arthritis Mother   . Hyperlipidemia Mother   . Hypertension Mother   . Hypertension Father   . Diabetes Father   . Allergies Daughter   . Allergies Son   . Arthritis Maternal Grandmother   . Stroke Maternal Grandmother 50  . Hypertension Maternal Grandmother   . Leukemia Paternal Grandmother   . Coronary artery disease Maternal Aunt 50  . Coronary artery disease Maternal Aunt 50    Allergies  Allergen  Reactions  . Amoxicillin     rash  . Ace Inhibitors     REACTION: lip swelling    Current Outpatient Prescriptions on File Prior to Visit  Medication Sig Dispense Refill  . ALPRAZolam (XANAX) 0.25 MG tablet TAKE ONE TABLET BY MOUTH THREE TIMES DAILY AS NEEDED FOR ANXIETY  30 tablet  0  . buPROPion (WELLBUTRIN SR) 150 MG 12 hr tablet TAKE ONE TABLET BY MOUTH TWICE DAILY  60 tablet  0  . furosemide (LASIX) 20 MG tablet TAKE ONE TABLET BY MOUTH ONCE DAILY  30 tablet  2  . hydrochlorothiazide (HYDRODIURIL) 25 MG tablet TAKE ONE TABLET BY MOUTH ONCE DAILY  30 tablet  3  . metoprolol succinate (TOPROL-XL) 25 MG 24 hr tablet TAKE ONE TABLET BY MOUTH ONCE DAILY  30 tablet  3  . Multiple Vitamin (MULTIVITAMIN) tablet Take 1 tablet by mouth daily.         No current facility-administered medications on file prior to visit.    BP 124/78  Pulse 68  Temp(Src) 97.8 F (36.6 C) (Oral)  Resp 14  Ht 5' 4.5" (1.638 m)  Wt 192 lb 3.2 oz (87.181 kg)  BMI 32.49 kg/m2  SpO2 98%       Objective:   Physical Exam  Constitutional: She is oriented to person, place, and time. She appears well-developed and well-nourished. No distress.  Neurological: She is alert and oriented to person, place, and time.  Psychiatric: Her speech is normal and behavior is normal. Judgment and thought content normal. Cognition and memory are normal.  Flat affect          Assessment & Plan:

## 2013-09-20 NOTE — Patient Instructions (Signed)
Please continue wellbutrin.   Start effexor. Go directly to ER if you develop thoughts of hurting yourself or others.

## 2013-09-20 NOTE — Assessment & Plan Note (Signed)
>>  ASSESSMENT AND PLAN FOR ANXIETY ASSOCIATED WITH DEPRESSION WRITTEN ON 09/20/2013  3:57 PM BY O'SULLIVAN, Iylah Dworkin, NP  deteriorated- depression>anxiety. Will add effexor. We did discuss rare/serious risk of serotonin syndrome. She is instructed to go to the ED if she develops SI/HI and she verbalizes understanding. 15 minutes spent with pt.  >50% of this time was spent counseling pt on anxiety/depression

## 2013-09-20 NOTE — Assessment & Plan Note (Signed)
Commended pt on her weight loss.

## 2013-10-05 ENCOUNTER — Other Ambulatory Visit: Payer: Self-pay | Admitting: Family

## 2013-10-20 ENCOUNTER — Other Ambulatory Visit: Payer: Self-pay | Admitting: Family

## 2013-10-21 NOTE — Telephone Encounter (Signed)
Refill sent per [facility] refill protocol/SLS  

## 2013-11-03 ENCOUNTER — Other Ambulatory Visit: Payer: Self-pay | Admitting: Family

## 2013-11-04 NOTE — Telephone Encounter (Signed)
Rx request to pharmacy/SLS  

## 2013-11-08 ENCOUNTER — Ambulatory Visit (INDEPENDENT_AMBULATORY_CARE_PROVIDER_SITE_OTHER): Payer: Managed Care, Other (non HMO) | Admitting: Family

## 2013-11-08 ENCOUNTER — Encounter: Payer: Self-pay | Admitting: Family

## 2013-11-08 VITALS — BP 130/80 | HR 69 | Temp 98.3°F | Resp 16 | Ht 64.5 in | Wt 196.6 lb

## 2013-11-08 DIAGNOSIS — F418 Other specified anxiety disorders: Secondary | ICD-10-CM

## 2013-11-08 DIAGNOSIS — I10 Essential (primary) hypertension: Secondary | ICD-10-CM

## 2013-11-08 LAB — BASIC METABOLIC PANEL
BUN: 9 mg/dL (ref 6–23)
CO2: 27 mEq/L (ref 19–32)
Calcium: 8.7 mg/dL (ref 8.4–10.5)
Chloride: 108 mEq/L (ref 96–112)
Creatinine, Ser: 0.7 mg/dL (ref 0.4–1.2)
GFR: 115.57 mL/min (ref >60.00)
Glucose, Bld: 82 mg/dL (ref 70–99)
Potassium: 3.3 mEq/L — ABNORMAL LOW (ref 3.5–5.1)
Sodium: 139 mEq/L (ref 135–145)

## 2013-11-08 MED ORDER — METOPROLOL SUCCINATE ER 25 MG PO TB24: ORAL_TABLET | ORAL | Status: DC

## 2013-11-08 MED ORDER — ALPRAZOLAM 0.25 MG PO TABS: ORAL_TABLET | ORAL | Status: DC

## 2013-11-08 MED ORDER — HYDROCHLOROTHIAZIDE 25 MG PO TABS: ORAL_TABLET | ORAL | Status: DC

## 2013-11-08 MED ORDER — VENLAFAXINE HCL ER 75 MG PO CP24: 75.0000 mg | ORAL_CAPSULE | Freq: Every day | ORAL | Status: DC

## 2013-11-08 MED ORDER — FUROSEMIDE 20 MG PO TABS: ORAL_TABLET | ORAL | Status: DC

## 2013-11-08 NOTE — Progress Notes (Signed)
Subjective:    Patient ID: Nicole Quinn, female    DOB: February 01, 1975, 38 y.o.   MRN: [id number]  HPI Nicole Quinn is a 38 year old female who presents today for follow up. 1. Anxiety- Last visit was prescribed Effexor. She feels like her anxiety is improved and her temper is better.  She continues taking her Wellbutrin.  She does not report any adverse effects to the medication. Since starting the Effexor, she only needs her alprazolam at night.  She also feels that her depression has improved.  Denies increasing feelings of depression and suicidal ideation.    2. Hypertension - Stable on meds.  Takes hctz, metoprolol and furosemide prn for swelling.  Today's blood pressure is 130/80.    Review of Systems  Constitutional: Negative for activity change, appetite change and fatigue.  HENT: Negative.   Respiratory: Negative.  Negative for cough and shortness of breath.   Cardiovascular: Negative for chest pain, palpitations and leg swelling.  Psychiatric/Behavioral: Negative for confusion, decreased concentration and agitation. The patient is not nervous/anxious.    Past Medical History  Diagnosis Date  . Anemia   . Allergy   . Hypertension   . History of UTI   . Endometriosis     History   Social History  . Marital Status: Single    Spouse Name: N/A    Number of Children: 2  . Years of Education: N/A   Occupational History  .      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 Smoking status: Never Smoker   . Smokeless tobacco: Never Used  . Alcohol Use: No  . Drug Use: Not on file  . Sexual Activity: Not on file   Other Topics Concern  . Not on file   Social History Narrative   Regular exercise: yes.   Lives with one child.  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            Past Surgical History  Procedure Laterality Date  . Abdominal hysterectomy  2003    partial  . Carpal tunnel release      right  . Ganglion cyst excision      right     Family History  Problem Relation Age of Onset  . Arthritis Mother   . Hyperlipidemia Mother   . Hypertension Mother   . Hypertension Father   . Diabetes Father   . Allergies Daughter   . Allergies Son   . Arthritis Maternal Grandmother   . Stroke Maternal Grandmother 50  . Hypertension Maternal Grandmother   . Leukemia Paternal Grandmother   . Coronary artery disease Maternal Aunt 50  . Coronary artery disease Maternal Aunt 50    Allergies  Allergen Reactions  . Amoxicillin     rash  . Ace Inhibitors     REACTION: lip swelling    Current Outpatient Prescriptions on File Prior to Visit  Medication Sig Dispense Refill  . buPROPion (WELLBUTRIN SR) 150 MG 12 hr tablet TAKE ONE TABLET BY MOUTH TWICE DAILY  60 tablet  3  . Multiple Vitamin (MULTIVITAMIN) tablet Take 1 tablet by mouth daily.         No current facility-administered medications on file prior to visit.    BP 130/80  Pulse 69  Temp(Src) 98.3 F (36.8 C) (Oral)  Resp 16  Ht 5' 4.5" (1.638 m)  Wt 196 lb 9.6 oz (89.177 kg)  BMI 33.24 kg/m2  SpO2  99%       Objective:   Physical Exam  Constitutional: She is oriented to person, place, and time. She appears well-developed and well-nourished.  HENT:  Head: Normocephalic and atraumatic.  Cardiovascular: Normal rate, regular rhythm and normal heart sounds.   Pulmonary/Chest: Effort normal and breath sounds normal. No respiratory distress.  Musculoskeletal: Normal range of motion.  Neurological: She is alert and oriented to person, place, and time.  Skin: Skin is warm and dry.  Psychiatric: She has a normal mood and affect. Her behavior is normal. Thought content normal.          Assessment & Plan:  Continue with Effexor and alprazolam as needed.  Will obtain Bmet today as well as urine toxicity.  Follow up in three months.    I have personally seen and examined patient and agree with Jettie Booze NP student's assessment and plan.

## 2013-11-08 NOTE — Progress Notes (Signed)
Pre visit review using our clinic review tool, if applicable. No additional management support is needed unless otherwise documented below in the visit note. 

## 2013-11-08 NOTE — Assessment & Plan Note (Signed)
BP stable on HCTZ, metoprolol and furosemide as needed for swelling.  Will obtain bmet today.

## 2013-11-08 NOTE — Patient Instructions (Addendum)
Please complete lab work prior to leaving. (blood work and assured toxicology) Follow up in 3 months.

## 2013-11-08 NOTE — Assessment & Plan Note (Signed)
>>  ASSESSMENT AND PLAN FOR ANXIETY ASSOCIATED WITH DEPRESSION WRITTEN ON 11/08/2013  7:59 AM BY SMITH, ERIN E  Continue with Effexor and alprazolam as needed.  Follow up in 3 months.

## 2013-11-08 NOTE — Assessment & Plan Note (Addendum)
Continue with Effexor and alprazolam as needed.  Follow up in 3 months.

## 2013-11-09 ENCOUNTER — Telehealth: Payer: Self-pay | Admitting: Family

## 2013-11-09 DIAGNOSIS — E876 Hypokalemia: Secondary | ICD-10-CM

## 2013-11-09 MED ORDER — POTASSIUM CHLORIDE CRYS ER 20 MEQ PO TBCR: 20.0000 meq | EXTENDED_RELEASE_TABLET | Freq: Every day | ORAL | Status: DC

## 2013-11-09 NOTE — Telephone Encounter (Signed)
Potassium is low. Add Kdur 20 MeQ once daily, repeat bmet in 1 week, dx hypokalemia.

## 2013-11-09 NOTE — Telephone Encounter (Signed)
Patient notified of results, orders entered. Voices understanding.

## 2013-11-22 ENCOUNTER — Ambulatory Visit (INDEPENDENT_AMBULATORY_CARE_PROVIDER_SITE_OTHER): Payer: PRIVATE HEALTH INSURANCE | Admitting: Podiatry

## 2013-11-22 ENCOUNTER — Encounter: Payer: Self-pay | Admitting: Podiatry

## 2013-11-22 VITALS — BP 144/96 | HR 65 | Ht 64.5 in | Wt 190.0 lb

## 2013-11-22 DIAGNOSIS — M79606 Pain in leg, unspecified: Secondary | ICD-10-CM

## 2013-11-22 DIAGNOSIS — M2041 Other hammer toe(s) (acquired), right foot: Secondary | ICD-10-CM

## 2013-11-22 DIAGNOSIS — M2042 Other hammer toe(s) (acquired), left foot: Secondary | ICD-10-CM

## 2013-11-22 NOTE — Progress Notes (Signed)
SUBJECTIVE: 38 y.o. year old female presents complaining of painful corns on both feet for duration of 5 years.  She has had previous hammer toe surgeries on both feet in 2002 and corns came back and hurts on both feet, 2nd on right, 3rd and 4th on left. Patient requests surgical intervention on these painful corns.   OBJECTIVE: DERMATOLOGIC EXAMINATION: Normal nails. Digital corns at DIPJ on 2nd right, 3rd and 4th left, and PIPJ 5th digt left. VASCULAR EXAMINATION OF LOWER LIMBS: Pedal pulses: All pedal pulses are palpable with normal pulsation.  NEUROLOGIC EXAMINATION OF THE LOWER LIMBS: All epicritic and tactile sensations grossly intact.  MUSCULOSKELETAL EXAMINATION: Mild Hallux valgus with bunion bilateral. Status post digital surgery with fused IPJ 2-4 bilateral.  Contracted DIPJ 2nd right, 3rd and 4th left, contracted PIPJ 5th left.  RADIOGRAPHIC STUDIES:  Multiple digital contractures bilateral. Post surgical phalangeal head resection 2-4 bilateral.   ASSESSMENT: Painful hammer toes with digital corns 2nd right, 3-5 left.   PLAN: Reviewed clinical findings and available options. Patient request surgical options. Surgery consent form was reviewed for Hammer toe repair 3rd and 4th left foot, 2nd on right foot reviewed and signed. Also reviewed possible recurrence and failure of procedures, and possible complications.

## 2013-11-22 NOTE — Patient Instructions (Signed)
Pre op discussion on Hammer toe repair 2nd right, 3rd and 4th left done.

## 2013-12-18 ENCOUNTER — Other Ambulatory Visit: Payer: Self-pay | Admitting: Family

## 2013-12-19 NOTE — Telephone Encounter (Signed)
Pt last seen in 10/2013 and advised f/u in January. Last Rx printed 11/21/13.  Please advise:  Medication name:  Name from pharmacy:  ALPRAZolam (XANAX) 0.25 MG tablet ALPRAZolam 0.25MG  TAB     Sig: TAKE ONE TABLET BY MOUTH THREE TIMES DAILY AS NEEDED FOR ANXIETY    Dispense: 30 tablet   Refills: 0   Start: 12/18/2013   Class: Normal    Requested on: 11/08/2013    Originally ordered on: 01/26/2012 11/21/2013

## 2013-12-20 NOTE — Telephone Encounter (Signed)
Rx called to pharmacy voicemail as below. 

## 2013-12-20 NOTE — Telephone Encounter (Signed)
OK to send 30 tabs zero refills.  

## 2013-12-29 DIAGNOSIS — M2040 Other hammer toe(s) (acquired), unspecified foot: Secondary | ICD-10-CM

## 2013-12-29 HISTORY — PX: OTHER SURGICAL HISTORY: SHX169

## 2014-01-03 ENCOUNTER — Telehealth: Payer: Self-pay | Admitting: Family

## 2014-01-03 ENCOUNTER — Ambulatory Visit (INDEPENDENT_AMBULATORY_CARE_PROVIDER_SITE_OTHER): Payer: PRIVATE HEALTH INSURANCE | Admitting: Podiatry

## 2014-01-03 ENCOUNTER — Encounter: Payer: Self-pay | Admitting: Podiatry

## 2014-01-03 VITALS — BP 139/87 | HR 73

## 2014-01-03 DIAGNOSIS — M2041 Other hammer toe(s) (acquired), right foot: Secondary | ICD-10-CM

## 2014-01-03 DIAGNOSIS — M2042 Other hammer toe(s) (acquired), left foot: Secondary | ICD-10-CM

## 2014-01-03 NOTE — Telephone Encounter (Signed)
Pt in need of clinical advice

## 2014-01-03 NOTE — Progress Notes (Signed)
One week post op, hammer toe repair 3rd, 4th left, 2nd right.  Wounds are clean and healing well.  Wound cleansed with Iodine solution and new dressing applied. Return in one week.

## 2014-01-03 NOTE — Patient Instructions (Signed)
Seen for one week post op check. Wound healing is normal and doing good. Keep the dressing dry. Return in one week.

## 2014-01-03 NOTE — Telephone Encounter (Signed)
Pt had foot surgery on Thursday.  She said that she was started on Vicodin and anti-inflammatory.  On Friday, she started experiencing whitish vaginal discharge and odor.  Afebrile. She denies itching and burning.  Has significant history of BV.  Pt was scheduled an appointment with Debbrah Alar tomorrow (01/04/14) at 10:30 am.

## 2014-01-03 NOTE — Telephone Encounter (Signed)
Caller name: Romona, Murdy Relation to pt: self  Call back [phone]  Reason for call:   Pt had surgery on 12/29/13 pt states in need of clinical advice regarding a possible vagainal infection.

## 2014-01-04 ENCOUNTER — Ambulatory Visit (INDEPENDENT_AMBULATORY_CARE_PROVIDER_SITE_OTHER): Payer: Managed Care, Other (non HMO) | Admitting: Family

## 2014-01-04 ENCOUNTER — Encounter: Payer: Self-pay | Admitting: Family

## 2014-01-04 ENCOUNTER — Other Ambulatory Visit (HOSPITAL_COMMUNITY)
Admission: RE | Admit: 2014-01-04 | Discharge: 2014-01-04 | Disposition: A | Discharge status: Home / Self Care | Payer: Managed Care, Other (non HMO) | Source: Ambulatory Visit | Attending: Family | Admitting: Family

## 2014-01-04 VITALS — BP 110/80 | HR 66 | Temp 97.9°F | Resp 16 | Ht 64.5 in | Wt 196.6 lb

## 2014-01-04 DIAGNOSIS — B9689 Other specified bacterial agents as the cause of diseases classified elsewhere: Secondary | ICD-10-CM

## 2014-01-04 DIAGNOSIS — E876 Hypokalemia: Secondary | ICD-10-CM

## 2014-01-04 DIAGNOSIS — N760 Acute vaginitis: Secondary | ICD-10-CM | POA: Insufficient documentation

## 2014-01-04 DIAGNOSIS — A499 Bacterial infection, unspecified: Secondary | ICD-10-CM

## 2014-01-04 MED ORDER — METRONIDAZOLE 0.75 % VA GEL: VAGINAL | Status: DC

## 2014-01-04 NOTE — Addendum Note (Signed)
Addended by: Kelle Darting A on: 01/04/2014 01:57 PM   Modules accepted: Orders

## 2014-01-04 NOTE — Patient Instructions (Addendum)
Start metrogel one nightly for 5 days, then once weekly. Call if symptoms worsen, do not improve or recur.   Follow up in 6 months.

## 2014-01-04 NOTE — Progress Notes (Signed)
Subjective:    Patient ID: Nicole Quinn, female    DOB: [birth year], 38 y.o.   MRN: [id number]  HPI  Nicole Quinn is a 38 yr old female who presents today to discuss vaginitis. Reports + vaginal discharge since Friday.  + odor.  Discharge is thin white.  Denies associate itching.   Review of Systems    see HPI  Past Medical History  Diagnosis Date  . Anemia   . Allergy   . Hypertension   . History of UTI   . Endometriosis     History   Social History  . Marital Status: Single    Spouse Name: N/A    Number of Children: 2  . Years of Education: N/A   Occupational History  .      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 Smoking status: Never Smoker   . Smokeless tobacco: Never Used  . Alcohol Use: No  . Drug Use: Not on file  . Sexual Activity: Not on file   Other Topics Concern  . Not on file   Social History Narrative   Regular exercise: yes.   Lives with one child.  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            Past Surgical History  Procedure Laterality Date  . Abdominal hysterectomy  2003    partial  . Carpal tunnel release      right  . Ganglion cyst excision      right  . Hammer toe repair Bilateral 12/29/2013    Lt #3, #4, Rt #2.    Family History  Problem Relation Age of Onset  . Arthritis Mother   . Hyperlipidemia Mother   . Hypertension Mother   . Hypertension Father   . Diabetes Father   . Allergies Daughter   . Allergies Son   . Arthritis Maternal Grandmother   . Stroke Maternal Grandmother 50  . Hypertension Maternal Grandmother   . Leukemia Paternal Grandmother   . Coronary artery disease Maternal Aunt 50  . Coronary artery disease Maternal Aunt 50    Allergies  Allergen Reactions  . Amoxicillin     rash  . Ace Inhibitors     REACTION: lip swelling    Current Outpatient Prescriptions on File Prior to Visit  Medication Sig Dispense Refill  . ALPRAZolam (XANAX) 0.25 MG tablet TAKE ONE  TABLET BY MOUTH THREE TIMES DAILY AS NEEDED FOR ANXIETY 30 tablet 0  . buPROPion (WELLBUTRIN SR) 150 MG 12 hr tablet TAKE ONE TABLET BY MOUTH TWICE DAILY 60 tablet 3  . furosemide (LASIX) 20 MG tablet TAKE ONE TABLET BY MOUTH ONCE DAILY 90 tablet 1  . hydrochlorothiazide (HYDRODIURIL) 25 MG tablet TAKE ONE TABLET BY MOUTH ONCE DAILY 90 tablet 1  . HYDROcodone-acetaminophen (NORCO) 7.5-325 MG per tablet     . metoprolol succinate (TOPROL-XL) 25 MG 24 hr tablet TAKE ONE TABLET BY MOUTH ONCE DAILY 90 tablet 1  . Multiple Vitamin (MULTIVITAMIN) tablet Take 1 tablet by mouth daily.      . nabumetone (RELAFEN) 500 MG tablet     . potassium chloride SA (K-DUR,KLOR-CON) 20 MEQ tablet Take 1 tablet (20 mEq total) by mouth daily. 30 tablet 3  . venlafaxine XR (EFFEXOR-XR) 75 MG 24 hr capsule Take 1 capsule (75 mg total) by mouth daily with breakfast. 90 capsule 1   No current facility-administered medications on file prior to visit.  BP 110/80 mmHg  Pulse 66  Temp(Src) 97.9 F (36.6 C) (Oral)  Resp 16  Ht 5' 4.5" (1.638 m)  Wt 196 lb 9.6 oz (89.177 kg)  BMI 33.24 kg/m2  SpO2 98%    Objective:   Physical Exam  Constitutional: She is oriented to person, place, and time. She appears well-developed and well-nourished. No distress.  HENT:  Head: Normocephalic and atraumatic.  Cardiovascular: Normal rate and regular rhythm.   No murmur heard. Pulmonary/Chest: Effort normal and breath sounds normal. No respiratory distress. She has no wheezes. She has no rales. She exhibits no tenderness.  Genitourinary:  Normal external vulva  Neurological: She is alert and oriented to person, place, and time.  Psychiatric: She has a normal mood and affect. Her behavior is normal. Judgment and thought content normal.          Assessment & Plan:

## 2014-01-04 NOTE — Progress Notes (Signed)
Pre visit review using our clinic review tool, if applicable. No additional management support is needed unless otherwise documented below in the visit note. 

## 2014-01-04 NOTE — Assessment & Plan Note (Signed)
Recurrent. Rx with metrogel and will try once weekly dosing thereafter to hopefully help recurrence.

## 2014-01-05 LAB — CERVICOVAGINAL ANCILLARY ONLY
Wet Prep (BD Affirm): NEGATIVE
Wet Prep (BD Affirm): NEGATIVE
Wet Prep (BD Affirm): POSITIVE — AB

## 2014-01-10 ENCOUNTER — Ambulatory Visit (INDEPENDENT_AMBULATORY_CARE_PROVIDER_SITE_OTHER): Payer: PRIVATE HEALTH INSURANCE | Admitting: Podiatry

## 2014-01-10 ENCOUNTER — Encounter: Payer: Self-pay | Admitting: Podiatry

## 2014-01-10 DIAGNOSIS — M2041 Other hammer toe(s) (acquired), right foot: Secondary | ICD-10-CM

## 2014-01-10 DIAGNOSIS — M2042 Other hammer toe(s) (acquired), left foot: Secondary | ICD-10-CM

## 2014-01-10 NOTE — Patient Instructions (Signed)
Post op. Doing well. Suture removed. Return in one week.

## 2014-01-10 NOTE — Progress Notes (Signed)
Post op Hammer toe bilateral. Doing well. Wound well coapted. Sutures removed. Mefix tape applied. Return in one week.

## 2014-01-16 ENCOUNTER — Other Ambulatory Visit: Payer: Self-pay | Admitting: Family

## 2014-01-17 ENCOUNTER — Encounter: Payer: Self-pay | Admitting: Podiatry

## 2014-01-17 ENCOUNTER — Ambulatory Visit (INDEPENDENT_AMBULATORY_CARE_PROVIDER_SITE_OTHER): Payer: PRIVATE HEALTH INSURANCE | Admitting: Podiatry

## 2014-01-17 DIAGNOSIS — Z9889 Other specified postprocedural states: Secondary | ICD-10-CM

## 2014-01-17 NOTE — Progress Notes (Signed)
Post op wound. Still need more healing on 3rd left. Return in one week. Keep bandage dry.

## 2014-01-17 NOTE — Patient Instructions (Signed)
Post op wound. Still need more healing on 3rd left. Return in one week. Keep bandage dry.

## 2014-01-18 ENCOUNTER — Telehealth: Payer: Self-pay | Admitting: Family

## 2014-01-18 MED ORDER — FLUCONAZOLE 150 MG PO TABS: 150.0000 mg | ORAL_TABLET | Freq: Once | ORAL | Status: DC

## 2014-01-18 NOTE — Telephone Encounter (Signed)
Sent Diflucan to the pharmacy.

## 2014-01-18 NOTE — Telephone Encounter (Signed)
Caller name: Loan, Oguin Relation to pt: self  Call back number: [phone] [facility]: [facility] [phone]  Reason for call:  Pt states metroNIDAZOLE (METROGEL) 0.75 % vaginal gel caused her to have a yeast infection pt expercicing vaginal discharge. As per patient medication would be sent in if yeast infection occurred. Please advised

## 2014-01-18 NOTE — Telephone Encounter (Signed)
Left a message making patient aware.

## 2014-01-18 NOTE — Telephone Encounter (Signed)
Please advise.  Pt was last seen on 01/04/14.

## 2014-01-24 ENCOUNTER — Ambulatory Visit (INDEPENDENT_AMBULATORY_CARE_PROVIDER_SITE_OTHER): Payer: PRIVATE HEALTH INSURANCE | Admitting: Podiatry

## 2014-01-24 ENCOUNTER — Encounter: Payer: Self-pay | Admitting: Podiatry

## 2014-01-24 DIAGNOSIS — Z9889 Other specified postprocedural states: Secondary | ICD-10-CM

## 2014-01-24 NOTE — Progress Notes (Signed)
Post op wound well healed. No sign of infection or edema noted. Continue keep coban for a while. Return as needed.

## 2014-01-24 NOTE — Patient Instructions (Signed)
Post op wound healed well. Return as needed.

## 2014-01-27 ENCOUNTER — Encounter: Payer: Self-pay | Admitting: Physician Assistant

## 2014-01-27 ENCOUNTER — Ambulatory Visit (INDEPENDENT_AMBULATORY_CARE_PROVIDER_SITE_OTHER): Payer: Managed Care, Other (non HMO) | Admitting: Physician Assistant

## 2014-01-27 VITALS — BP 135/95 | HR 72 | Temp 98.4°F | Resp 16 | Ht 64.5 in | Wt 195.0 lb

## 2014-01-27 DIAGNOSIS — L309 Dermatitis, unspecified: Secondary | ICD-10-CM

## 2014-01-27 MED ORDER — PREDNISONE 10 MG PO TABS: ORAL_TABLET | ORAL | Status: DC

## 2014-01-27 NOTE — Patient Instructions (Signed)
Please take steroid taper following the instructions on the bottle and you AVS summary.  Continue Benadryl at bedtime and start Claritin in the morning.  Use Sarna lotion for itch.  Cool compresses may also be beneficial.  Please call if symptoms are not improving on steroid.

## 2014-01-27 NOTE — Progress Notes (Signed)
Pre visit review using our clinic review tool, if applicable. No additional management support is needed unless otherwise documented below in the visit note/SLS  

## 2014-01-29 DIAGNOSIS — L309 Dermatitis, unspecified: Secondary | ICD-10-CM | POA: Insufficient documentation

## 2014-01-29 NOTE — Progress Notes (Signed)
Patient presents to clinic today c/o pruritic rash of torso and upper abdomen and upper extremities that has been present for 2 weeks.  Denies recent travel.  Denies sick contact with similar rash.  Denies personal history of similar rash.  Denies change to detergents, soaps, lotions or other hygiene products.  Denies new pet.  Denies recent change to medications.  Past Medical History  Diagnosis Date  . Anemia   . Allergy   . Hypertension   . History of UTI   . Endometriosis     Current Outpatient Prescriptions on File Prior to Visit  Medication Sig Dispense Refill  . ALPRAZolam (XANAX) 0.25 MG tablet TAKE ONE TABLET BY MOUTH THREE TIMES DAILY AS NEEDED FOR  ANXIETY 30 tablet 0  . buPROPion (WELLBUTRIN SR) 150 MG 12 hr tablet TAKE ONE TABLET BY MOUTH TWICE DAILY 60 tablet 3  . fluconazole (DIFLUCAN) 150 MG tablet Take 1 tablet (150 mg total) by mouth once. 1 tablet 0  . furosemide (LASIX) 20 MG tablet TAKE ONE TABLET BY MOUTH ONCE DAILY 90 tablet 1  . hydrochlorothiazide (HYDRODIURIL) 25 MG tablet TAKE ONE TABLET BY MOUTH ONCE DAILY 90 tablet 1  . HYDROcodone-acetaminophen (NORCO) 7.5-325 MG per tablet     . metoprolol succinate (TOPROL-XL) 25 MG 24 hr tablet TAKE ONE TABLET BY MOUTH ONCE DAILY 90 tablet 1  . metroNIDAZOLE (METROGEL) 0.75 % vaginal gel One applicator full in vagina nightly for 5 nights. Then once weekly 70 g 5  . Multiple Vitamin (MULTIVITAMIN) tablet Take 1 tablet by mouth daily.      . nabumetone (RELAFEN) 500 MG tablet     . potassium chloride SA (K-DUR,KLOR-CON) 20 MEQ tablet Take 1 tablet (20 mEq total) by mouth daily. 30 tablet 3  . venlafaxine XR (EFFEXOR-XR) 75 MG 24 hr capsule Take 1 capsule (75 mg total) by mouth daily with breakfast. 90 capsule 1   No current facility-administered medications on file prior to visit.    Allergies  Allergen Reactions  . Amoxicillin     rash  . Ace Inhibitors     REACTION: lip swelling    Family History  Problem  Relation Age of Onset  . Arthritis Mother   . Hyperlipidemia Mother   . Hypertension Mother   . Hypertension Father   . Diabetes Father   . Allergies Daughter   . Allergies Son   . Arthritis Maternal Grandmother   . Stroke Maternal Grandmother 50  . Hypertension Maternal Grandmother   . Leukemia Paternal Grandmother   . Coronary artery disease Maternal Aunt 50  . Coronary artery disease Maternal Aunt 50    History   Social History  . Marital Status: Single    Spouse Name: N/A    Number of Children: 2  . Years of Education: N/A   Occupational History  .      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 Smoking status: Never Smoker   . Smokeless tobacco: Never Used  . Alcohol Use: No  . Drug Use: None  . Sexual Activity: None   Other Topics Concern  . None   Social History Narrative   Regular exercise: yes.   Lives with one child.  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           Review of Systems - See HPI.  All other ROS are negative.  BP 135/95 mmHg  Pulse 72  Temp(Src) 98.4 F (  36.9 C) (Oral)  Resp 16  Ht 5' 4.5" (1.638 m)  Wt 195 lb (88.451 kg)  BMI 32.97 kg/m2  SpO2 100%  Physical Exam  Constitutional: She is oriented to person, place, and time and well-developed, well-nourished, and in no distress.  HENT:  Head: Normocephalic and atraumatic.  Eyes: Conjunctivae are normal. Pupils are equal, round, and reactive to light.  Neck: Neck supple.  Cardiovascular: Normal rate, regular rhythm, normal heart sounds and intact distal pulses.   Pulmonary/Chest: Effort normal and breath sounds normal. No respiratory distress. She has no wheezes. She has no rales. She exhibits no tenderness.  Lymphadenopathy:    She has no cervical adenopathy.  Neurological: She is alert and oriented to person, place, and time.  Skin: Skin is warm and dry. Rash noted. Rash is maculopapular.  Vitals reviewed.   Recent Results (from the past 2160  hour(s))  Basic metabolic panel     Status: Abnormal   Collection Time: 11/08/13  8:13 AM  Result Value Ref Range   Sodium 139 135 - 145 mEq/L   Potassium 3.3 (L) 3.5 - 5.1 mEq/L   Chloride 108 96 - 112 mEq/L   CO2 27 19 - 32 mEq/L   Glucose, Bld 82 70 - 99 mg/dL   BUN 9 6 - 23 mg/dL   Creatinine, Ser 0.7 0.4 - 1.2 mg/dL   Calcium 8.7 8.4 - 10.5 mg/dL   GFR 115.57 >60.00 mL/min  Cervicovaginal ancillary only     Status: Abnormal   Collection Time: 01/04/14 12:00 AM  Result Value Ref Range   BD affirm Candida: Negative     Comment: Normal Reference Range - Negative   BD affirm **Gardnerella: POSITIVE** (A)     Comment: Normal Reference Range - Negative   BD affirm Trichomonas: Negative     Comment: Normal Reference Range - Negative    Assessment/Plan: Dermatitis Rx prednisone taper. Benadryl at bedtime and AM Claritin for itch.  Cool compresses and Sarna lotion may also be beneficial. Monitor for potential triggers of symptoms. Follow-up in 1 week if symptoms are not progressively improving.

## 2014-01-29 NOTE — Assessment & Plan Note (Signed)
Rx prednisone taper. Benadryl at bedtime and AM Claritin for itch.  Cool compresses and Sarna lotion may also be beneficial. Monitor for potential triggers of symptoms. Follow-up in 1 week if symptoms are not progressively improving.

## 2014-01-30 ENCOUNTER — Telehealth: Payer: Self-pay | Admitting: Family

## 2014-01-30 MED ORDER — CLOBETASOL PROPIONATE 0.05 % EX LOTN: 1.0000 "application " | TOPICAL_LOTION | Freq: Two times a day (BID) | CUTANEOUS | Status: DC

## 2014-01-30 NOTE — Telephone Encounter (Signed)
That unfortunately can happen with steroid medication. How has the rash responded to the steroid?  If mostly cleared, we can stop the oral steroid and begin a cream.  If no improvement, I recommend she stop steroid and come in for re-evaluation.

## 2014-01-30 NOTE — Telephone Encounter (Signed)
LMOM with contact name and number for return call RE: advice on medication & rash per provider instructions/SLS

## 2014-01-30 NOTE — Telephone Encounter (Signed)
Stop oral steroid.  Rx clobetasol BID. Continue supportive measures.  Follow-up Friday.

## 2014-01-30 NOTE — Telephone Encounter (Signed)
Caller name: [name], [name] Relation to pt: self  Call back number: [phone]   Reason for call:  Pt was seen 01/27/13 for an allergic reaction and now pt states predniSONE (DELTASONE) 10 MG tablet is causing bruising. In need of clinical advice.

## 2014-01-30 NOTE — Telephone Encounter (Signed)
Patient states that Rash is gone, just having the itching. Best [phone]

## 2014-01-31 NOTE — Telephone Encounter (Signed)
Left detailed message informing patient of this and to schedule appointment for Friday.

## 2014-01-31 NOTE — Telephone Encounter (Signed)
Patient informed, understood & agreed; f/u appt scheduled for Fri, 01.15.16 at 4:00pm/SLS

## 2014-02-03 ENCOUNTER — Ambulatory Visit (INDEPENDENT_AMBULATORY_CARE_PROVIDER_SITE_OTHER): Payer: Managed Care, Other (non HMO) | Admitting: Physician Assistant

## 2014-02-03 ENCOUNTER — Encounter: Payer: Self-pay | Admitting: Physician Assistant

## 2014-02-03 VITALS — HR 77 | Temp 99.4°F | Resp 16 | Ht 64.5 in | Wt 200.2 lb

## 2014-02-03 DIAGNOSIS — L309 Dermatitis, unspecified: Secondary | ICD-10-CM

## 2014-02-03 DIAGNOSIS — R233 Spontaneous ecchymoses: Secondary | ICD-10-CM

## 2014-02-03 LAB — CBC WITH DIFFERENTIAL/PLATELET
Basophils Absolute: 0 10*3/uL (ref 0.0–0.1)
Basophils Relative: 0 % (ref 0–1)
Eosinophils Absolute: 0.2 10*3/uL (ref 0.0–0.7)
Eosinophils Relative: 3 % (ref 0–5)
HCT: 34.5 % — ABNORMAL LOW (ref 36.0–46.0)
Hemoglobin: 12 g/dL (ref 12.0–15.0)
Lymphocytes Relative: 47 % — ABNORMAL HIGH (ref 12–46)
Lymphs Abs: 3.8 10*3/uL (ref 0.7–4.0)
MCH: 31.6 pg (ref 26.0–34.0)
MCHC: 34.8 g/dL (ref 30.0–36.0)
MCV: 90.8 fL (ref 78.0–100.0)
MPV: 9.7 fL (ref 8.6–12.4)
Monocytes Absolute: 0.5 10*3/uL (ref 0.1–1.0)
Monocytes Relative: 6 % (ref 3–12)
Neutro Abs: 3.5 10*3/uL (ref 1.7–7.7)
Neutrophils Relative %: 44 % (ref 43–77)
Platelets: 329 10*3/uL (ref 150–400)
RBC: 3.8 MIL/uL — ABNORMAL LOW (ref 3.87–5.11)
RDW: 13.9 % (ref 11.5–15.5)
WBC: 8 10*3/uL (ref 4.0–10.5)

## 2014-02-03 MED ORDER — HYDROXYZINE HCL 10 MG PO TABS: 10.0000 mg | ORAL_TABLET | Freq: Three times a day (TID) | ORAL | Status: DC | PRN

## 2014-02-03 NOTE — Patient Instructions (Signed)
Please continue topical steroid.  Please take the Hydroxyzine as directed for itch.  Cool compresses will be beneficial.  I will call you with lab results.  You will be contacted by a Dermatology office for evaluation.

## 2014-02-03 NOTE — Progress Notes (Signed)
Pre visit review using our clinic review tool, if applicable. No additional management support is needed unless otherwise documented below in the visit note/SLS  

## 2014-02-04 LAB — PROTIME-INR
INR: 0.97 (ref <1.50)
Prothrombin Time: 12.9 seconds (ref 11.6–15.2)

## 2014-02-04 LAB — APTT: aPTT: 29 seconds (ref 24–37)

## 2014-02-04 NOTE — Progress Notes (Signed)
  Subjective:     Nicole Quinn is a 39 y.o. female who presents for evaluation of a rash involving the chest, face, upper body and upper extremity. Rash started 1 week ago. Lesions are pink, and raised in texture. Rash has changed over time. Rash is pruritic. Associated symptoms: none. Patient denies: abdominal pain, arthralgia, decrease in appetite, decrease in energy level, fever, headache and irritability. Patient has not had contacts with similar rash. Patient has not had new exposures (soaps, lotions, laundry detergents, foods, medications, plants, insects or animals).  The following portions of the patient's history were reviewed and updated as appropriate: allergies, current medications, past family history, past medical history, past social history, past surgical history and problem list.  Review of Systems Pertinent items are noted in HPI.    Objective:    Pulse 77  Temp(Src) 99.4 F (37.4 C) (Oral)  Resp 16  Ht 5' 4.5" (1.638 m)  Wt 200 lb 4 oz (90.833 kg)  BMI 33.85 kg/m2  SpO2 100% General:  alert, cooperative, appears stated age and no distress  Skin:  papules noted on extremities and trunk and purpura/ecchymosis noted on extremities     Assessment:    Unspecified Dermatitis    Plan:    Medications: topical steroid: Clobetasol and Hydroxyzine 10 mg TID.  Ecchymosis resolving since DC of oral steroid.  Will obtain CBC, PT and PTT today.  Referral placed to dermatology for further evaluation and management. Written patient instruction given. Follow up in 1 week.

## 2014-02-22 ENCOUNTER — Encounter: Payer: Self-pay | Admitting: Podiatry

## 2014-02-22 ENCOUNTER — Ambulatory Visit (INDEPENDENT_AMBULATORY_CARE_PROVIDER_SITE_OTHER): Payer: PRIVATE HEALTH INSURANCE | Admitting: Podiatry

## 2014-02-22 DIAGNOSIS — M2040 Other hammer toe(s) (acquired), unspecified foot: Secondary | ICD-10-CM | POA: Insufficient documentation

## 2014-02-22 DIAGNOSIS — M79606 Pain in leg, unspecified: Secondary | ICD-10-CM

## 2014-02-22 NOTE — Patient Instructions (Signed)
Hammer toe surgery follow up. Right 2nd toe has undissolved suture surfacing out. Will use compression wrap with tape. Return in one week.

## 2014-02-22 NOTE — Progress Notes (Signed)
Patient complaining about pain over incision area both 2nd toe. Right 2nd toe has bleeding spot.  Objective: Friction caused erythema on both 2nd toe. Un-dissolved inner suture surfacing out in opening hole. Radiographic examination reveal no abnormal findings. Consistent with hammer toe surgery performed on 2nd digit bilateral.  Plan: Area cleansed with Iodine and compression tape applied. Will re examine next week. If not helping, will make open incision and re suture the area. Return in one week.

## 2014-02-24 ENCOUNTER — Encounter: Payer: PRIVATE HEALTH INSURANCE | Admitting: Podiatry

## 2014-02-27 ENCOUNTER — Encounter: Payer: Self-pay | Admitting: Family

## 2014-02-27 ENCOUNTER — Ambulatory Visit (INDEPENDENT_AMBULATORY_CARE_PROVIDER_SITE_OTHER): Payer: Managed Care, Other (non HMO) | Admitting: Family

## 2014-02-27 VITALS — BP 118/82 | HR 89 | Temp 97.4°F | Resp 18 | Ht 64.5 in | Wt 213.4 lb

## 2014-02-27 DIAGNOSIS — J069 Acute upper respiratory infection, unspecified: Secondary | ICD-10-CM

## 2014-02-27 NOTE — Patient Instructions (Signed)
Upper Respiratory Infection, Adult An upper respiratory infection (URI) is also sometimes known as the common cold. The upper respiratory tract includes the nose, sinuses, throat, trachea, and bronchi. Bronchi are the airways leading to the lungs. Most people improve within 1 week, but symptoms can last up to 2 weeks. A residual cough may last even longer.  CAUSES Many different viruses can infect the tissues lining the upper respiratory tract. The tissues become irritated and inflamed and often become very moist. Mucus production is also common. A cold is contagious. You can easily spread the virus to others by oral contact. This includes kissing, sharing a glass, coughing, or sneezing. Touching your mouth or nose and then touching a surface, which is then touched by another person, can also spread the virus. SYMPTOMS  Symptoms typically develop 1 to 3 days after you come in contact with a cold virus. Symptoms vary from person to person. They may include:  Runny nose.  Sneezing.  Nasal congestion.  Sinus irritation.  Sore throat.  Loss of voice (laryngitis).  Cough.  Fatigue.  Muscle aches.  Loss of appetite.  Headache.  Low-grade fever. DIAGNOSIS  You might diagnose your own cold based on familiar symptoms, since most people get a cold 2 to 3 times a year. Your caregiver can confirm this based on your exam. Most importantly, your caregiver can check that your symptoms are not due to another disease such as strep throat, sinusitis, pneumonia, asthma, or epiglottitis. Blood tests, throat tests, and X-rays are not necessary to diagnose a common cold, but they may sometimes be helpful in excluding other more serious diseases. Your caregiver will decide if any further tests are required. RISKS AND COMPLICATIONS  You may be at risk for a more severe case of the common cold if you smoke cigarettes, have chronic heart disease (such as heart failure) or lung disease (such as asthma), or if  you have a weakened immune system. The very young and very old are also at risk for more serious infections. Bacterial sinusitis, middle ear infections, and bacterial pneumonia can complicate the common cold. The common cold can worsen asthma and chronic obstructive pulmonary disease (COPD). Sometimes, these complications can require emergency medical care and may be life-threatening. PREVENTION  The best way to protect against getting a cold is to practice good hygiene. Avoid oral or hand contact with people with cold symptoms. Wash your hands often if contact occurs. There is no clear evidence that vitamin C, vitamin E, echinacea, or exercise reduces the chance of developing a cold. However, it is always recommended to get plenty of rest and practice good nutrition. TREATMENT  Treatment is directed at relieving symptoms. There is no cure. Antibiotics are not effective, because the infection is caused by a virus, not by bacteria. Treatment may include:  Increased fluid intake. Sports drinks offer valuable electrolytes, sugars, and fluids.  Breathing heated mist or steam (vaporizer or shower).  Eating chicken soup or other clear broths, and maintaining good nutrition.  Getting plenty of rest.  Using gargles or lozenges for comfort.  Controlling fevers with ibuprofen or acetaminophen as directed by your caregiver.  Increasing usage of your inhaler if you have asthma. Zinc gel and zinc lozenges, taken in the first 24 hours of the common cold, can shorten the duration and lessen the severity of symptoms. Pain medicines may help with fever, muscle aches, and throat pain. A variety of non-prescription medicines are available to treat congestion and runny nose. Your caregiver   can make recommendations and may suggest nasal or lung inhalers for other symptoms.  HOME CARE INSTRUCTIONS   Only take over-the-counter or prescription medicines for pain, discomfort, or fever as directed by your  caregiver.  Use a warm mist humidifier or inhale steam from a shower to increase air moisture. This may keep secretions moist and make it easier to breathe.  Drink enough water and fluids to keep your urine clear or pale yellow.  Rest as needed.  Return to work when your temperature has returned to normal or as your caregiver advises. You may need to stay home longer to avoid infecting others. You can also use a face mask and careful hand washing to prevent spread of the virus. SEEK MEDICAL CARE IF:   After the first few days, you feel you are getting worse rather than better.  You need your caregiver's advice about medicines to control symptoms.  You develop chills, worsening shortness of breath, or brown or red sputum. These may be signs of pneumonia.  You develop yellow or brown nasal discharge or pain in the face, especially when you bend forward. These may be signs of sinusitis.  You develop a fever, swollen neck glands, pain with swallowing, or white areas in the back of your throat. These may be signs of strep throat. SEEK IMMEDIATE MEDICAL CARE IF:   You have a fever.  You develop severe or persistent headache, ear pain, sinus pain, or chest pain.  You develop wheezing, a prolonged cough, cough up blood, or have a change in your usual mucus (if you have chronic lung disease).  You develop sore muscles or a stiff neck. Document Released: 07/02/2000 Document Revised: 03/31/2011 Document Reviewed: 04/13/2013 [employer] Patient Information 2015 [employer]. This information is not intended to replace advice given to you by your health care provider. Make sure you discuss any questions you have with your health care provider.  

## 2014-02-27 NOTE — Progress Notes (Signed)
Subjective:    Patient ID: Nicole Quinn, female    DOB: 09-01-1975, 39 y.o.   MRN: [id number]  HPI [name] is here today for sore throat, body aches, and non-productive cough for 3 days. Nasal discharge is clear. She also reports dizziness, frontal sinus pressure, headache, laryngitis, SOB.  Denies sneezing, itchy watery eyes, fever. Denies sick contacts. Taking Dayquil and Nyquil without relief. Ibuprofen helps with HA and body aches. She took the flu vaccine this season.  Reports edema in legs (calves and thighs) in last three days. She takes lasix for this and took a dose this am without any reduction in edema.  Review of Systems  Constitutional:       Reports reduced appetite.  HENT: Positive for congestion, rhinorrhea, sinus pressure and sore throat. Negative for dental problem, ear pain and sneezing.   Respiratory: Positive for cough, shortness of breath and wheezing.   Gastrointestinal: Positive for nausea.  Musculoskeletal: Positive for myalgias.  Hematological: Negative for adenopathy.   Past Medical History  Diagnosis Date  . Anemia   . Allergy   . Hypertension   . History of UTI   . Endometriosis     History   Social History  . Marital Status: Single    Spouse Name: N/A    Number of Children: 2  . Years of Education: N/A   Occupational History  .      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 Smoking status: Never Smoker   . Smokeless tobacco: Never Used  . Alcohol Use: No  . Drug Use: Not on file  . Sexual Activity: Not on file   Other Topics Concern  . Not on file   Social History Narrative   Regular exercise: yes.   Lives with one child.  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            Past Surgical History  Procedure Laterality Date  . Abdominal hysterectomy  2003    partial  . Carpal tunnel release      right  . Ganglion cyst excision      right  . Hammer toe repair Bilateral 12/29/2013    Lt #3, #4, Rt  #2.    Family History  Problem Relation Age of Onset  . Arthritis Mother   . Hyperlipidemia Mother   . Hypertension Mother   . Hypertension Father   . Diabetes Father   . Allergies Daughter   . Allergies Son   . Arthritis Maternal Grandmother   . Stroke Maternal Grandmother 50  . Hypertension Maternal Grandmother   . Leukemia Paternal Grandmother   . Coronary artery disease Maternal Aunt 50  . Coronary artery disease Maternal Aunt 50    Allergies  Allergen Reactions  . Amoxicillin     rash  . Ace Inhibitors     REACTION: lip swelling    Current Outpatient Prescriptions on File Prior to Visit  Medication Sig Dispense Refill  . ALPRAZolam (XANAX) 0.25 MG tablet TAKE ONE TABLET BY MOUTH THREE TIMES DAILY AS NEEDED FOR  ANXIETY 30 tablet 0  . buPROPion (WELLBUTRIN SR) 150 MG 12 hr tablet TAKE ONE TABLET BY MOUTH TWICE DAILY 60 tablet 3  . Clobetasol Propionate 0.05 % lotion Apply 1 application topically 2 (two) times daily. 59 mL 0  . fluconazole (DIFLUCAN) 150 MG tablet Take 1 tablet (150 mg total) by mouth once. 1 tablet 0  . furosemide (LASIX) 20  MG tablet TAKE ONE TABLET BY MOUTH ONCE DAILY 90 tablet 1  . hydrochlorothiazide (HYDRODIURIL) 25 MG tablet TAKE ONE TABLET BY MOUTH ONCE DAILY 90 tablet 1  . HYDROcodone-acetaminophen (NORCO) 7.5-325 MG per tablet     . hydrOXYzine (ATARAX/VISTARIL) 10 MG tablet Take 1 tablet (10 mg total) by mouth 3 (three) times daily as needed. 30 tablet 0  . metoprolol succinate (TOPROL-XL) 25 MG 24 hr tablet TAKE ONE TABLET BY MOUTH ONCE DAILY 90 tablet 1  . metroNIDAZOLE (METROGEL) 0.75 % vaginal gel One applicator full in vagina nightly for 5 nights. Then once weekly 70 g 5  . Multiple Vitamin (MULTIVITAMIN) tablet Take 1 tablet by mouth daily.      . nabumetone (RELAFEN) 500 MG tablet     . potassium chloride SA (K-DUR,KLOR-CON) 20 MEQ tablet Take 1 tablet (20 mEq total) by mouth daily. 30 tablet 3  . venlafaxine XR (EFFEXOR-XR) 75 MG 24 hr  capsule Take 1 capsule (75 mg total) by mouth daily with breakfast. 90 capsule 1   No current facility-administered medications on file prior to visit.    BP 118/82 mmHg  Pulse 89  Temp(Src) 97.4 F (36.3 C) (Oral)  Resp 18  Ht 5' 4.5" (1.638 m)  Wt 213 lb 6.4 oz (96.798 kg)  BMI 36.08 kg/m2  SpO2 98%       Objective:   Physical Exam  Constitutional: She appears well-developed and well-nourished.  HENT:  Head: Normocephalic and atraumatic.  Right Ear: Tympanic membrane and ear canal normal.  Left Ear: Tympanic membrane and ear canal normal.  Mouth/Throat: Posterior oropharyngeal erythema present. No oropharyngeal exudate or posterior oropharyngeal edema.  Cardiovascular: Normal rate, regular rhythm and normal heart sounds.   No murmur heard. Pulmonary/Chest: Effort normal and breath sounds normal. No respiratory distress. She has no wheezes.  Psychiatric: She has a normal mood and affect. Her behavior is normal. Judgment and thought content normal.          Assessment & Plan:

## 2014-02-27 NOTE — Progress Notes (Signed)
Pre visit review using our clinic review tool, if applicable. No additional management support is needed unless otherwise documented below in the visit note. 

## 2014-03-01 ENCOUNTER — Encounter: Payer: Self-pay | Admitting: Podiatry

## 2014-03-01 ENCOUNTER — Ambulatory Visit (INDEPENDENT_AMBULATORY_CARE_PROVIDER_SITE_OTHER): Payer: PRIVATE HEALTH INSURANCE | Admitting: Podiatry

## 2014-03-01 DIAGNOSIS — M79604 Pain in right leg: Secondary | ICD-10-CM

## 2014-03-01 NOTE — Progress Notes (Signed)
Follow up on Hammer toe surgery. Left foot doing well. Right 2nd toe still painful inclosed in shoes. She is going through a respiratory infection. Findings show red pin point spot at dorsal incision site 2nd toe right. Will have the area covered with Coban bandage till she gets over with her chest cold. We might excise the area and place suture when she returns.

## 2014-03-01 NOTE — Patient Instructions (Signed)
Continue with Coban wrap on 2nd toe right.

## 2014-03-02 ENCOUNTER — Telehealth: Payer: Self-pay | Admitting: Family

## 2014-03-02 DIAGNOSIS — J069 Acute upper respiratory infection, unspecified: Secondary | ICD-10-CM | POA: Insufficient documentation

## 2014-03-02 MED ORDER — DOXYCYCLINE HYCLATE 100 MG PO TABS: 100.0000 mg | ORAL_TABLET | Freq: Two times a day (BID) | ORAL | Status: DC

## 2014-03-02 NOTE — Telephone Encounter (Signed)
Left message on patient voicemail and notified patient of Rx and need for OV if symptoms do not improve.  eal

## 2014-03-02 NOTE — Assessment & Plan Note (Signed)
Pt advised on supportive measures and to call if symptoms worsen or if symptoms are not improved in 3 days.

## 2014-03-02 NOTE — Telephone Encounter (Signed)
Will send rx for doxy. Should be seen back in office if not better in 3 days or if symptoms worsen.

## 2014-03-02 NOTE — Telephone Encounter (Signed)
Caller name: Nesreen Relation to pt: self Call back number: [phone] [facility]acy: [facility] [address]  Reason for call:   Patient states that she is not feeling any better since Monday. Same symptoms. Sore throat, cough, headaches.

## 2014-03-02 NOTE — Telephone Encounter (Signed)
Spoke with patient who states her symptoms (URI) have not gotten better since she was seen (Monday).  She would like to know if something can be called in or if she should make an appt?   Please advise.

## 2014-03-13 ENCOUNTER — Encounter: Payer: Self-pay | Admitting: Podiatry

## 2014-03-13 ENCOUNTER — Ambulatory Visit (INDEPENDENT_AMBULATORY_CARE_PROVIDER_SITE_OTHER): Payer: PRIVATE HEALTH INSURANCE | Admitting: Podiatry

## 2014-03-13 DIAGNOSIS — M2040 Other hammer toe(s) (acquired), unspecified foot: Secondary | ICD-10-CM

## 2014-03-13 NOTE — Patient Instructions (Signed)
Follow up on hammer toe surgery. Return in one month for possible cortisone injection.

## 2014-03-13 NOTE — Progress Notes (Signed)
The 2nd toe right is not bleeding but still painful on both 2nd toes. Noted of increased fibrosis. Will continue with Coban wrap and will try Cortisone injection next month.

## 2014-03-22 ENCOUNTER — Ambulatory Visit (INDEPENDENT_AMBULATORY_CARE_PROVIDER_SITE_OTHER): Payer: Managed Care, Other (non HMO) | Admitting: Family

## 2014-03-22 ENCOUNTER — Encounter: Payer: Self-pay | Admitting: Family

## 2014-03-22 ENCOUNTER — Other Ambulatory Visit (HOSPITAL_COMMUNITY)
Admission: RE | Admit: 2014-03-22 | Discharge: 2014-03-22 | Disposition: A | Discharge status: Home / Self Care | Payer: Managed Care, Other (non HMO) | Source: Ambulatory Visit | Attending: Family | Admitting: Family

## 2014-03-22 VITALS — BP 126/90 | HR 73 | Temp 98.1°F | Resp 16 | Ht 64.5 in | Wt 207.8 lb

## 2014-03-22 DIAGNOSIS — R609 Edema, unspecified: Secondary | ICD-10-CM

## 2014-03-22 DIAGNOSIS — N760 Acute vaginitis: Secondary | ICD-10-CM | POA: Insufficient documentation

## 2014-03-22 DIAGNOSIS — J329 Chronic sinusitis, unspecified: Secondary | ICD-10-CM

## 2014-03-22 MED ORDER — LEVOFLOXACIN 500 MG PO TABS: 500.0000 mg | ORAL_TABLET | Freq: Every day | ORAL | Status: DC

## 2014-03-22 MED ORDER — FLUCONAZOLE 150 MG PO TABS: ORAL_TABLET | ORAL | Status: DC

## 2014-03-22 NOTE — Progress Notes (Signed)
Pre visit review using our clinic review tool, if applicable. No additional management support is needed unless otherwise documented below in the visit note. 

## 2014-03-22 NOTE — Patient Instructions (Signed)
Start diflucan, start levaquin. You will be contacted about your echo. Call if symptoms worsen or do not improve.

## 2014-03-22 NOTE — Assessment & Plan Note (Signed)
Suspect venous insufficiency.  Continue lasix, advised trial of support stockings. Obtain baseline echo.

## 2014-03-22 NOTE — Assessment & Plan Note (Signed)
Suspect yeast.  Wet prep performed today. Rx with diflucan empirically

## 2014-03-22 NOTE — Assessment & Plan Note (Signed)
Given duration of sxs will rx with levaquin

## 2014-03-22 NOTE — Addendum Note (Signed)
Addended by: Kelle Darting A on: 03/22/2014 09:24 AM   Modules accepted: Orders

## 2014-03-22 NOTE — Progress Notes (Signed)
Subjective:    Patient ID: Nicole Quinn, female    DOB: Dec 22, 1975, 39 y.o.   MRN: [id number]  HPI  [name] is a 39 yr old female who presents today with the following concerns:  1) Edema- continues. Began on 2/5. Located in calves and thighs.  Reports intermittent pain in legs 8/10 at times.  Denies orthopnea.  Reports SOB at rest and with activity. Continues lasix every day  Wt Readings from Last 3 Encounters:  03/22/14 207 lb 12.8 oz (94.257 kg)  02/27/14 213 lb 6.4 oz (96.798 kg)  02/03/14 200 lb 4 oz (90.833 kg)   2)URI- reports symptoms have improved somewhat, cough product yellow mucous, since 2/5, sinus pressure. Denies fever or ear pain.    3) Vaginal itching- reports "cheesy discharge."     Review of Systems    see HPI  Past Medical History  Diagnosis Date  . Anemia   . Allergy   . Hypertension   . History of UTI   . Endometriosis     History   Social History  . Marital Status: Single    Spouse Name: N/A  . Number of Children: 2  . Years of Education: N/A   Occupational History  .      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 Smoking status: Never Smoker   . Smokeless tobacco: Never Used  . Alcohol Use: No  . Drug Use: Not on file  . Sexual Activity: Not on file   Other Topics Concern  . Not on file   Social History Narrative   Regular exercise: yes.   Lives with one child.  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            Past Surgical History  Procedure Laterality Date  . Abdominal hysterectomy  2003    partial  . Carpal tunnel release      right  . Ganglion cyst excision      right  . Hammer toe repair Bilateral 12/29/2013    Lt #3, #4, Rt #2.    Family History  Problem Relation Age of Onset  . Arthritis Mother   . Hyperlipidemia Mother   . Hypertension Mother   . Hypertension Father   . Diabetes Father   . Allergies Daughter   . Allergies Son   . Arthritis Maternal Grandmother   . Stroke  Maternal Grandmother 50  . Hypertension Maternal Grandmother   . Leukemia Paternal Grandmother   . Coronary artery disease Maternal Aunt 50  . Coronary artery disease Maternal Aunt 50    Allergies  Allergen Reactions  . Amoxicillin     rash  . Ace Inhibitors     REACTION: lip swelling    Current Outpatient Prescriptions on File Prior to Visit  Medication Sig Dispense Refill  . ALPRAZolam (XANAX) 0.25 MG tablet TAKE ONE TABLET BY MOUTH THREE TIMES DAILY AS NEEDED FOR  ANXIETY 30 tablet 0  . buPROPion (WELLBUTRIN SR) 150 MG 12 hr tablet TAKE ONE TABLET BY MOUTH TWICE DAILY 60 tablet 3  . fluconazole (DIFLUCAN) 150 MG tablet Take 1 tablet (150 mg total) by mouth once. 1 tablet 0  . furosemide (LASIX) 20 MG tablet TAKE ONE TABLET BY MOUTH ONCE DAILY 90 tablet 1  . hydrochlorothiazide (HYDRODIURIL) 25 MG tablet TAKE ONE TABLET BY MOUTH ONCE DAILY 90 tablet 1  . metoprolol succinate (TOPROL-XL) 25 MG 24 hr tablet TAKE ONE TABLET BY MOUTH  ONCE DAILY 90 tablet 1  . metroNIDAZOLE (METROGEL) 0.75 % vaginal gel One applicator full in vagina nightly for 5 nights. Then once weekly 70 g 5  . Multiple Vitamin (MULTIVITAMIN) tablet Take 1 tablet by mouth daily.      . potassium chloride SA (K-DUR,KLOR-CON) 20 MEQ tablet Take 1 tablet (20 mEq total) by mouth daily. 30 tablet 3  . venlafaxine XR (EFFEXOR-XR) 75 MG 24 hr capsule Take 1 capsule (75 mg total) by mouth daily with breakfast. 90 capsule 1   No current facility-administered medications on file prior to visit.    BP 126/90 mmHg  Pulse 73  Temp(Src) 98.1 F (36.7 C) (Oral)  Resp 16  Ht 5' 4.5" (1.638 m)  Wt 207 lb 12.8 oz (94.257 kg)  BMI 35.13 kg/m2  SpO2 99%    Objective:   Physical Exam  Constitutional: She is oriented to person, place, and time. She appears well-developed and well-nourished.  HENT:  Right Ear: Tympanic membrane and ear canal normal.  Left Ear: Tympanic membrane and ear canal normal.  Nose: Right sinus  exhibits no maxillary sinus tenderness and no frontal sinus tenderness. Left sinus exhibits no maxillary sinus tenderness and no frontal sinus tenderness.  Cardiovascular: Normal rate, regular rhythm and normal heart sounds.   No murmur heard. Pulmonary/Chest: Effort normal and breath sounds normal. No respiratory distress. She has no wheezes.  Genitourinary: There is no rash on the right labia. There is no rash on the left labia.  No obvious external vaginal discharge  Musculoskeletal: She exhibits no edema.  Neurological: She is alert and oriented to person, place, and time.  Skin: Skin is warm and dry.  Psychiatric: She has a normal mood and affect. Her behavior is normal. Judgment and thought content normal.          Assessment & Plan:

## 2014-03-23 ENCOUNTER — Encounter: Payer: Self-pay | Admitting: Family

## 2014-03-23 LAB — CERVICOVAGINAL ANCILLARY ONLY
Wet Prep (BD Affirm): NEGATIVE
Wet Prep (BD Affirm): NEGATIVE
Wet Prep (BD Affirm): POSITIVE — AB

## 2014-03-23 MED ORDER — ORLISTAT 120 MG PO CAPS: 120.0000 mg | ORAL_CAPSULE | Freq: Three times a day (TID) | ORAL | Status: DC

## 2014-03-27 MED ORDER — LORCASERIN HCL 10 MG PO TABS: 10.0000 mg | ORAL_TABLET | Freq: Two times a day (BID) | ORAL | Status: DC

## 2014-03-27 NOTE — Addendum Note (Signed)
Addended by: Debbrah Alar on: 03/27/2014 08:51 AM   Modules accepted: Orders

## 2014-03-27 NOTE — Telephone Encounter (Signed)
See Rx. Please contact pt that rx is ready and leave at front desk with controlled substance contract. Also, need uds.

## 2014-03-29 ENCOUNTER — Ambulatory Visit (HOSPITAL_BASED_OUTPATIENT_CLINIC_OR_DEPARTMENT_OTHER)
Admission: RE | Admit: 2014-03-29 | Discharge: 2014-03-29 | Disposition: A | Discharge status: Home / Self Care | Payer: 59 | Source: Ambulatory Visit | Attending: Family | Admitting: Family

## 2014-03-29 DIAGNOSIS — R609 Edema, unspecified: Secondary | ICD-10-CM

## 2014-03-29 NOTE — Progress Notes (Signed)
  Echocardiogram 2D Echocardiogram has been performed.  Nicole Quinn M 03/29/2014, 12:03 PM

## 2014-03-30 ENCOUNTER — Encounter: Payer: Self-pay | Admitting: Family

## 2014-03-31 ENCOUNTER — Other Ambulatory Visit: Payer: Self-pay | Admitting: Family

## 2014-04-02 MED ORDER — FLUCONAZOLE 150 MG PO TABS: ORAL_TABLET | ORAL | Status: DC

## 2014-04-03 MED ORDER — ALPRAZOLAM 0.25 MG PO TABS: 0.2500 mg | ORAL_TABLET | Freq: Three times a day (TID) | ORAL | Status: DC | PRN

## 2014-04-03 NOTE — Telephone Encounter (Signed)
Rx faxed to the pharmacy.

## 2014-04-03 NOTE — Telephone Encounter (Signed)
Last alprazolam Rx on 01/16/14, #30 x no refills. Pt last seen 03/22/14. Rx printed and forwarded to Provider for signature.

## 2014-04-07 ENCOUNTER — Ambulatory Visit (INDEPENDENT_AMBULATORY_CARE_PROVIDER_SITE_OTHER): Payer: PRIVATE HEALTH INSURANCE | Admitting: Podiatry

## 2014-04-07 ENCOUNTER — Encounter: Payer: Self-pay | Admitting: Podiatry

## 2014-04-07 DIAGNOSIS — Z9889 Other specified postprocedural states: Secondary | ICD-10-CM

## 2014-04-07 NOTE — Progress Notes (Signed)
Well healed post op wound on 2nd digit right, 3rd and 4th digit left. No edema or erythema noted. May continue wrap with Coban bandage as needed. Return as needed.

## 2014-04-07 NOTE — Patient Instructions (Signed)
Post op wound has healed well. May wrap as needed.

## 2014-04-10 ENCOUNTER — Ambulatory Visit (INDEPENDENT_AMBULATORY_CARE_PROVIDER_SITE_OTHER): Payer: Managed Care, Other (non HMO) | Admitting: Medical

## 2014-04-10 ENCOUNTER — Encounter: Payer: Self-pay | Admitting: Medical

## 2014-04-10 ENCOUNTER — Telehealth: Payer: Self-pay | Admitting: Family

## 2014-04-10 VITALS — BP 126/77 | HR 92 | Temp 101.1°F | Ht 64.5 in | Wt 206.8 lb

## 2014-04-10 DIAGNOSIS — J029 Acute pharyngitis, unspecified: Secondary | ICD-10-CM

## 2014-04-10 DIAGNOSIS — R52 Pain, unspecified: Secondary | ICD-10-CM

## 2014-04-10 DIAGNOSIS — B349 Viral infection, unspecified: Secondary | ICD-10-CM

## 2014-04-10 LAB — POCT INFLUENZA A/B
Influenza A, POC: NEGATIVE
Influenza B, POC: NEGATIVE

## 2014-04-10 LAB — POCT RAPID STREP A (OFFICE): Rapid Strep A Screen: NEGATIVE

## 2014-04-10 MED ORDER — OSELTAMIVIR PHOSPHATE 75 MG PO CAPS: 75.0000 mg | ORAL_CAPSULE | Freq: Two times a day (BID) | ORAL | Status: DC

## 2014-04-10 MED ORDER — AZITHROMYCIN 250 MG PO TABS: ORAL_TABLET | ORAL | Status: DC

## 2014-04-10 NOTE — Telephone Encounter (Signed)
Error/gd °

## 2014-04-10 NOTE — Progress Notes (Signed)
Subjective:    Patient ID: Nicole Quinn, female    DOB: [birth year], 39 y.o.   MRN: [id number]  HPI   Pt states on Friday she felt st(severe pain) then on saturday she had body aches all over. Occasional  cough but high fever.  Fatigue. No family or friend sick. Pt does office work.   LMP- hysterectomy.    Review of Systems  Constitutional: Positive for fever and fatigue. Negative for chills.  HENT: Positive for congestion, ear pain and sore throat. Negative for ear discharge and postnasal drip.        Ear pain.  Respiratory: Positive for cough. Negative for chest tightness and wheezing.   Cardiovascular: Negative for chest pain and palpitations.  Gastrointestinal: Negative for abdominal pain.  Musculoskeletal:       Head to toe achiness.  Neurological: Positive for headaches. Negative for dizziness, syncope and weakness.  Hematological: Positive for adenopathy. Does not bruise/bleed easily.  Psychiatric/Behavioral: Negative for suicidal ideas, confusion, dysphoric mood and agitation.   Past Medical History  Diagnosis Date  . Anemia   . Allergy   . Hypertension   . History of UTI   . Endometriosis     History   Social History  . Marital Status: Single    Spouse Name: N/A  . Number of Children: 2  . Years of Education: N/A   Occupational History  .      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 Smoking status: Never Smoker   . Smokeless tobacco: Never Used  . Alcohol Use: No  . Drug Use: Not on file  . Sexual Activity: Not on file   Other Topics Concern  . Not on file   Social History Narrative   Regular exercise: yes.   Lives with one child.  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            Past Surgical History  Procedure Laterality Date  . Abdominal hysterectomy  2003    partial  . Carpal tunnel release      right  . Ganglion cyst excision      right  . Hammer toe repair Bilateral 12/29/2013    Lt #3, #4, Rt #2.     Family History  Problem Relation Age of Onset  . Arthritis Mother   . Hyperlipidemia Mother   . Hypertension Mother   . Hypertension Father   . Diabetes Father   . Allergies Daughter   . Allergies Son   . Arthritis Maternal Grandmother   . Stroke Maternal Grandmother 50  . Hypertension Maternal Grandmother   . Leukemia Paternal Grandmother   . Coronary artery disease Maternal Aunt 50  . Coronary artery disease Maternal Aunt 50    Allergies  Allergen Reactions  . Amoxicillin     rash  . Ace Inhibitors     REACTION: lip swelling    Current Outpatient Prescriptions on File Prior to Visit  Medication Sig Dispense Refill  . ALPRAZolam (XANAX) 0.25 MG tablet Take 1 tablet (0.25 mg total) by mouth 3 (three) times daily as needed for anxiety. 30 tablet 0  . buPROPion (WELLBUTRIN SR) 150 MG 12 hr tablet TAKE ONE TABLET BY MOUTH TWICE DAILY 60 tablet 3  . fluconazole (DIFLUCAN) 150 MG tablet One tab by mouth today, repeat in 3 days 2 tablet 0  . furosemide (LASIX) 20 MG tablet TAKE ONE TABLET BY MOUTH ONCE DAILY 90 tablet 1  .  hydrochlorothiazide (HYDRODIURIL) 25 MG tablet TAKE ONE TABLET BY MOUTH ONCE DAILY 90 tablet 1  . levofloxacin (LEVAQUIN) 500 MG tablet Take 1 tablet (500 mg total) by mouth daily. 7 tablet 0  . Lorcaserin HCl (BELVIQ) 10 MG TABS Take 10 mg by mouth 2 (two) times daily. 60 tablet 0  . metoprolol succinate (TOPROL-XL) 25 MG 24 hr tablet TAKE ONE TABLET BY MOUTH ONCE DAILY 90 tablet 1  . metroNIDAZOLE (METROGEL) 0.75 % vaginal gel One applicator full in vagina nightly for 5 nights. Then once weekly 70 g 5  . Multiple Vitamin (MULTIVITAMIN) tablet Take 1 tablet by mouth daily.      Marland Kitchen orlistat (XENICAL) 120 MG capsule Take 1 capsule (120 mg total) by mouth 3 (three) times daily with meals. 90 capsule 2  . potassium chloride SA (K-DUR,KLOR-CON) 20 MEQ tablet Take 1 tablet (20 mEq total) by mouth daily. 30 tablet 3  . venlafaxine XR (EFFEXOR-XR) 75 MG 24 hr capsule  Take 1 capsule (75 mg total) by mouth daily with breakfast. 90 capsule 1   No current facility-administered medications on file prior to visit.    BP 126/77 mmHg  Pulse 92  Temp(Src) 101.1 F (38.4 C) (Oral)  Ht 5' 4.5" (1.638 m)  Wt 206 lb 12.8 oz (93.804 kg)  BMI 34.96 kg/m2  SpO2 100%      Objective:   Physical Exam  General  Mental Status - Alert. General Appearance - Well groomed. Not in acute distress. But looks ill.  Skin Rashes- No Rashes.  HEENT Head- Normal. Ear Auditory Canal - Left- Normal. Right - Normal.Tympanic Membrane- Left- bright red. Right- Normal. Eye Sclera/Conjunctiva- Left- Normal. Right- Normal. Nose & Sinuses Nasal Mucosa- Left-  Mild boggy + Congested. Right-  Mild boggy + Congested. Mouth & Throat Lips: Upper Lip- Normal: no dryness, cracking, pallor, cyanosis, or vesicular eruption. Lower Lip-Normal: no dryness, cracking, pallor, cyanosis or vesicular eruption. Buccal Mucosa- Bilateral- No Aphthous ulcers. Oropharynx- No Discharge or Erythema. Tonsils: Characteristics- Bilateral- moderte Erythema _Congestion. Size/Enlargement- Bilateral- 1+t. Discharge- bilateral-None.  Neck Neck- Supple. No Masses. Moderate enlarged submandibular lymph nodes.   Chest and Lung Exam Auscultation: Breath Sounds:- even and unlabored, Cardiovascular Auscultation:Rythm- Regular, rate and rhythm. Murmurs & Other Heart Sounds:Ausculatation of the heart reveal- No Murmurs.  Lymphatic Head & Neck General Head & Neck Lymphatics: Bilateral: Description- No Localized lymphadenopathy.       Assessment & Plan:

## 2014-04-10 NOTE — Assessment & Plan Note (Signed)
Flu like. You appear to have the flu even though your  rapid flu test was negative.(Important to note sometimes flu test can be falsely negative.) Therefore,  I am treating you with tamiflu based on your clinical presentation. Rest, hydrate and take tylenol for fever. Alternate ibuprofen  if necessary for body ache or fever. You do appear to have lt om. So I am going to rx azithromycin antibiotic. This would treat your throat as well in event you had strep infection along with flu.

## 2014-04-10 NOTE — Progress Notes (Signed)
Pre visit review using our clinic review tool, if applicable. No additional management support is needed unless otherwise documented below in the visit note. 

## 2014-04-10 NOTE — Patient Instructions (Signed)
Viral syndrome Flu like. You appear to have the flu even though your  rapid flu test was negative.(Important to note sometimes flu test can be falsely negative.) Therefore,  I am treating you with tamiflu based on your clinical presentation. Rest, hydrate and take tylenol for fever. Alternate ibuprofen  if necessary for body ache or fever. You do appear to have lt om. So I am going to rx azithromycin antibiotic. This would treat your throat as well in event you had strep infection along with flu.       Follow up in 7 days or as needed.

## 2014-04-11 ENCOUNTER — Encounter: Payer: PRIVATE HEALTH INSURANCE | Admitting: Podiatry

## 2014-04-24 ENCOUNTER — Other Ambulatory Visit: Payer: Self-pay | Admitting: Family

## 2014-05-10 ENCOUNTER — Encounter: Payer: Self-pay | Admitting: Family

## 2014-05-17 ENCOUNTER — Telehealth: Payer: Self-pay | Admitting: Family

## 2014-05-17 NOTE — Telephone Encounter (Signed)
Pre visit letter for annual exam mailed

## 2014-05-23 ENCOUNTER — Telehealth: Payer: Self-pay | Admitting: Family

## 2014-05-23 MED ORDER — ALPRAZOLAM 0.25 MG PO TABS: 0.2500 mg | ORAL_TABLET | Freq: Three times a day (TID) | ORAL | Status: DC | PRN

## 2014-05-23 NOTE — Telephone Encounter (Signed)
Spoke with pharmacy re: alprazolam refill. They have not received authorization from [place] yet. Last 04/03/14. Pt has follow up 06/07/14.  Please advise:

## 2014-05-23 NOTE — Telephone Encounter (Signed)
Rx printed and forwarded to Provider for signature.

## 2014-05-23 NOTE — Addendum Note (Signed)
Addended by: Kelle Darting A on: 05/23/2014 04:02 PM   Modules accepted: Orders

## 2014-05-24 NOTE — Telephone Encounter (Signed)
Rx faxed to pharmacy at 7:50am.

## 2014-05-29 ENCOUNTER — Other Ambulatory Visit: Payer: Self-pay | Admitting: Family

## 2014-05-29 ENCOUNTER — Telehealth: Payer: Self-pay | Admitting: Family

## 2014-05-29 NOTE — Telephone Encounter (Signed)
Pre Visit letter sent  °

## 2014-05-30 ENCOUNTER — Other Ambulatory Visit: Payer: Self-pay | Admitting: Family

## 2014-05-31 MED ORDER — FLUCONAZOLE 150 MG PO TABS: ORAL_TABLET | ORAL | Status: DC

## 2014-06-07 ENCOUNTER — Encounter: Payer: Managed Care, Other (non HMO) | Admitting: Family

## 2014-06-15 ENCOUNTER — Encounter: Payer: Self-pay | Admitting: Family

## 2014-06-16 ENCOUNTER — Telehealth: Payer: Self-pay | Admitting: *Deleted

## 2014-06-16 MED ORDER — METRONIDAZOLE 500 MG PO TABS: 500.0000 mg | ORAL_TABLET | Freq: Two times a day (BID) | ORAL | Status: DC

## 2014-06-16 NOTE — Telephone Encounter (Signed)
Unable to reach patient at time of Pre Visit Call.   

## 2014-06-20 ENCOUNTER — Encounter: Payer: Self-pay | Admitting: Family

## 2014-06-20 ENCOUNTER — Telehealth: Payer: Self-pay | Admitting: Family

## 2014-06-20 ENCOUNTER — Ambulatory Visit (INDEPENDENT_AMBULATORY_CARE_PROVIDER_SITE_OTHER): Payer: Managed Care, Other (non HMO) | Admitting: Family

## 2014-06-20 VITALS — BP 120/82 | HR 72 | Temp 98.1°F | Resp 14 | Ht 64.5 in | Wt 190.0 lb

## 2014-06-20 DIAGNOSIS — E876 Hypokalemia: Secondary | ICD-10-CM

## 2014-06-20 DIAGNOSIS — R809 Proteinuria, unspecified: Secondary | ICD-10-CM

## 2014-06-20 DIAGNOSIS — Z0000 Encounter for general adult medical examination without abnormal findings: Secondary | ICD-10-CM

## 2014-06-20 LAB — CBC WITH DIFFERENTIAL/PLATELET
Basophils Absolute: 0 10*3/uL (ref 0.0–0.1)
Basophils Relative: 0.8 % (ref 0.0–3.0)
Eosinophils Absolute: 0 10*3/uL (ref 0.0–0.7)
Eosinophils Relative: 1.1 % (ref 0.0–5.0)
HCT: 37.3 % (ref 36.0–46.0)
Hemoglobin: 12.6 g/dL (ref 12.0–15.0)
Lymphocytes Relative: 48.6 % — ABNORMAL HIGH (ref 12.0–46.0)
Lymphs Abs: 2.2 10*3/uL (ref 0.7–4.0)
MCHC: 33.7 g/dL (ref 30.0–36.0)
MCV: 91 fl (ref 78.0–100.0)
Monocytes Absolute: 0.3 10*3/uL (ref 0.1–1.0)
Monocytes Relative: 7.4 % (ref 3.0–12.0)
Neutro Abs: 1.9 10*3/uL (ref 1.4–7.7)
Neutrophils Relative %: 42.1 % — ABNORMAL LOW (ref 43.0–77.0)
Platelets: 243 10*3/uL (ref 150.0–400.0)
RBC: 4.1 Mil/uL (ref 3.87–5.11)
RDW: 13.5 % (ref 11.5–15.5)
WBC: 4.6 10*3/uL (ref 4.0–10.5)

## 2014-06-20 LAB — HEPATIC FUNCTION PANEL
ALT: 13 U/L (ref 0–35)
AST: 18 U/L (ref 0–37)
Albumin: 4.1 g/dL (ref 3.5–5.2)
Alkaline Phosphatase: 55 U/L (ref 39–117)
Bilirubin, Direct: 0.1 mg/dL (ref 0.0–0.3)
Total Bilirubin: 0.4 mg/dL (ref 0.2–1.2)
Total Protein: 7.1 g/dL (ref 6.0–8.3)

## 2014-06-20 LAB — URINALYSIS, ROUTINE W REFLEX MICROSCOPIC
Hgb urine dipstick: NEGATIVE
Leukocytes, UA: NEGATIVE
Nitrite: NEGATIVE
Specific Gravity, Urine: >=1.03 — AB (ref 1.000–1.030)
Urine Glucose: NEGATIVE
Urobilinogen, UA: 0.2 (ref 0.0–1.0)
pH: 5.5 (ref 5.0–8.0)

## 2014-06-20 LAB — BASIC METABOLIC PANEL
BUN: 6 mg/dL (ref 6–23)
CO2: 27 mEq/L (ref 19–32)
Calcium: 9 mg/dL (ref 8.4–10.5)
Chloride: 107 mEq/L (ref 96–112)
Creatinine, Ser: 0.82 mg/dL (ref 0.40–1.20)
GFR: 100.72 mL/min (ref >60.00)
Glucose, Bld: 82 mg/dL (ref 70–99)
Potassium: 3.3 mEq/L — ABNORMAL LOW (ref 3.5–5.1)
Sodium: 139 mEq/L (ref 135–145)

## 2014-06-20 LAB — LIPID PANEL
Cholesterol: 143 mg/dL (ref 0–200)
HDL: 39 mg/dL — ABNORMAL LOW (ref >39.00)
LDL Cholesterol: 93 mg/dL (ref 0–99)
NonHDL: 104
Total CHOL/HDL Ratio: 4
Triglycerides: 53 mg/dL (ref 0.0–149.0)
VLDL: 10.6 mg/dL (ref 0.0–40.0)

## 2014-06-20 LAB — TSH: TSH: 1.36 u[IU]/mL (ref 0.35–4.50)

## 2014-06-20 MED ORDER — POTASSIUM CHLORIDE CRYS ER 20 MEQ PO TBCR: 20.0000 meq | EXTENDED_RELEASE_TABLET | Freq: Every day | ORAL | Status: DC

## 2014-06-20 NOTE — Progress Notes (Signed)
Subjective:    Patient ID: Nicole Quinn, female    DOB: February 16, 1975, 39 y.o.   MRN: [id number]  HPI  Patient presents today for complete physical.  Immunizations: tetanus 2015 Diet:healthy diet Exercise: works out 3-4 days a week. Pap Smear:hysterectomy Wt Readings from Last 3 Encounters:  06/20/14 190 lb (86.183 kg)  04/10/14 206 lb 12.8 oz (93.804 kg)  03/22/14 207 lb 12.8 oz (94.257 kg)  dental:  Up to date Vision- 2 years ago- due   Review of Systems  Constitutional: Negative for unexpected weight change.  HENT: Negative for hearing loss and rhinorrhea.   Eyes: Negative for visual disturbance.  Respiratory: Negative for cough.   Cardiovascular: Negative for leg swelling.  Gastrointestinal: Negative for nausea.       Occasional constipation  Genitourinary: Negative for dysuria and frequency.       Recent BV sxs- reports resolved with flagyl.   Musculoskeletal: Negative for myalgias and arthralgias.  Skin: Negative for rash.  Neurological: Negative for headaches.  Hematological: Negative for adenopathy.  Psychiatric/Behavioral: Negative for dysphoric mood and agitation.   Past Medical History  Diagnosis Date  . Anemia   . Allergy   . Hypertension   . History of UTI   . Endometriosis     History   Social History  . Marital Status: Single    Spouse Name: N/A  . Number of Children: 2  . Years of Education: N/A   Occupational History  .      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 Smoking status: Never Smoker   . Smokeless tobacco: Never Used  . Alcohol Use: No  . Drug Use: Not on file  . Sexual Activity: Not on file   Other Topics Concern  . Not on file   Social History Narrative   Regular exercise: yes.   Lives with one child.  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            Past Surgical History  Procedure Laterality Date  . Abdominal hysterectomy  2003    partial  . Carpal tunnel release      right  .  Ganglion cyst excision      right  . Hammer toe repair Bilateral 12/29/2013    Lt #3, #4, Rt #2.    Family History  Problem Relation Age of Onset  . Arthritis Mother   . Hyperlipidemia Mother   . Hypertension Mother   . Hypertension Father   . Diabetes Father   . Allergies Daughter   . Allergies Son   . Arthritis Maternal Grandmother   . Stroke Maternal Grandmother 50  . Hypertension Maternal Grandmother   . Leukemia Paternal Grandmother   . Coronary artery disease Maternal Aunt 50  . Coronary artery disease Maternal Aunt 50    Allergies  Allergen Reactions  . Amoxicillin     rash  . Ace Inhibitors     REACTION: lip swelling    Current Outpatient Prescriptions on File Prior to Visit  Medication Sig Dispense Refill  . ALPRAZolam (XANAX) 0.25 MG tablet Take 1 tablet (0.25 mg total) by mouth 3 (three) times daily as needed for anxiety. 30 tablet 0  . furosemide (LASIX) 20 MG tablet TAKE ONE TABLET BY MOUTH ONCE DAILY 90 tablet 0  . hydrochlorothiazide (HYDRODIURIL) 25 MG tablet TAKE ONE TABLET BY MOUTH ONCE DAILY 90 tablet 0  . metoprolol succinate (TOPROL-XL) 25 MG 24 hr tablet TAKE  ONE TABLET BY MOUTH ONCE DAILY 90 tablet 1  . metroNIDAZOLE (FLAGYL) 500 MG tablet Take 1 tablet (500 mg total) by mouth 2 (two) times daily. 14 tablet 2  . Multiple Vitamin (MULTIVITAMIN) tablet Take 1 tablet by mouth daily.       No current facility-administered medications on file prior to visit.    BP 120/82 mmHg  Pulse 72  Temp(Src) 98.1 F (36.7 C) (Oral)  Resp 14  Ht 5' 4.5" (1.638 m)  Wt 190 lb (86.183 kg)  BMI 32.12 kg/m2  SpO2 98%       Objective:   Physical Exam Physical Exam  Constitutional: She is oriented to person, place, and time. She appears well-developed and well-nourished. No distress.  HENT:  Head: Normocephalic and atraumatic.  Right Ear: Tympanic membrane and ear canal normal.  Left Ear: Tympanic membrane and ear canal normal.  Mouth/Throat: Oropharynx  is clear and moist.  Eyes: Pupils are equal, round, and reactive to light. No scleral icterus.  Neck: Normal range of motion. No thyromegaly present.  Cardiovascular: Normal rate and regular rhythm.   No murmur heard. Pulmonary/Chest: Effort normal and breath sounds normal. No respiratory distress. He has no wheezes. She has no rales. She exhibits no tenderness.  Abdominal: Soft. Bowel sounds are normal. He exhibits no distension and no mass. There is no tenderness. There is no rebound and no guarding.  Musculoskeletal: She exhibits no edema.  Lymphadenopathy:    She has no cervical adenopathy.  Neurological: She is alert and oriented to person, place, and time. She has normal reflexes. She exhibits normal muscle tone. Coordination normal.  Skin: Skin is warm and dry.  Psychiatric: She has a normal mood and affect. Her behavior is normal. Judgment and thought content normal.  Breasts: Examined lying Right: Without masses, retractions, discharge or axillary adenopathy.  Left: Without masses, retractions, discharge or axillary adenopathy.      Assessment & Plan:          Assessment & Plan:

## 2014-06-20 NOTE — Telephone Encounter (Signed)
Please let pt know that her urine show some protein.  I would like her to complete a 24 hour urine so we can measure the protein for further evaluation. Cholesterol is much better than it was 1 year ago.  Continue diet and exercise.  Potassium is low. (likely due to HCTZ) add Kdur 50mEQ once daily and repeat in 1 week.

## 2014-06-20 NOTE — Progress Notes (Signed)
Pre visit review using our clinic review tool, if applicable. No additional management support is needed unless otherwise documented below in the visit note. 

## 2014-06-20 NOTE — Telephone Encounter (Signed)
Left detailed message on pt's cell# and sent mychart message as well. Future lab order entered and Rx sent to pharmacy.

## 2014-06-20 NOTE — Assessment & Plan Note (Signed)
Encouraged pt to continue healthy diet, exercise weight loss efforts.  Immunizations reviewed and up to date. Obtain routine labs.

## 2014-06-20 NOTE — Patient Instructions (Signed)
Please complete lab work prior to leaving. Follow up in 6 months, sooner if problems/concerns. Keep up the good work with healthy diet, exercise and weight loss.

## 2014-06-22 NOTE — Addendum Note (Signed)
Addended by: Peggyann Shoals on: 06/22/2014 07:45 AM   Modules accepted: Orders

## 2014-06-23 ENCOUNTER — Encounter: Payer: Self-pay | Admitting: Family

## 2014-06-23 LAB — PROTEIN, URINE, 24 HOUR: Protein, Urine: <4 mg/dL — ABNORMAL LOW (ref 5–24)

## 2014-06-29 ENCOUNTER — Other Ambulatory Visit (INDEPENDENT_AMBULATORY_CARE_PROVIDER_SITE_OTHER): Payer: Managed Care, Other (non HMO)

## 2014-06-29 DIAGNOSIS — E876 Hypokalemia: Secondary | ICD-10-CM | POA: Diagnosis not present

## 2014-06-29 LAB — BASIC METABOLIC PANEL
BUN: 4 mg/dL — ABNORMAL LOW (ref 6–23)
CO2: 28 mEq/L (ref 19–32)
Calcium: 9.3 mg/dL (ref 8.4–10.5)
Chloride: 104 mEq/L (ref 96–112)
Creatinine, Ser: 0.76 mg/dL (ref 0.40–1.20)
GFR: 109.94 mL/min (ref >60.00)
Glucose, Bld: 74 mg/dL (ref 70–99)
Potassium: 3.6 mEq/L (ref 3.5–5.1)
Sodium: 138 mEq/L (ref 135–145)

## 2014-06-30 ENCOUNTER — Encounter: Payer: Self-pay | Admitting: Family

## 2014-07-19 ENCOUNTER — Encounter: Payer: Self-pay | Admitting: Family

## 2014-08-04 ENCOUNTER — Other Ambulatory Visit: Payer: Self-pay | Admitting: Family

## 2014-08-07 ENCOUNTER — Other Ambulatory Visit: Payer: Self-pay | Admitting: Family

## 2014-08-07 MED ORDER — HYDROCHLOROTHIAZIDE 25 MG PO TABS: 25.0000 mg | ORAL_TABLET | Freq: Every day | ORAL | Status: DC

## 2014-08-07 MED ORDER — FUROSEMIDE 20 MG PO TABS: 20.0000 mg | ORAL_TABLET | Freq: Every day | ORAL | Status: DC

## 2014-09-20 ENCOUNTER — Encounter: Payer: Self-pay | Admitting: Family

## 2014-09-20 MED ORDER — FLUCONAZOLE 150 MG PO TABS: ORAL_TABLET | ORAL | Status: DC

## 2014-10-04 ENCOUNTER — Other Ambulatory Visit: Payer: Self-pay | Admitting: Family

## 2014-10-04 MED ORDER — ALPRAZOLAM 0.25 MG PO TABS: 0.2500 mg | ORAL_TABLET | Freq: Three times a day (TID) | ORAL | Status: DC | PRN

## 2014-10-04 NOTE — Telephone Encounter (Signed)
Rx faxed to pharmacy  

## 2014-10-04 NOTE — Telephone Encounter (Signed)
Ok to send refill  

## 2014-10-04 NOTE — Addendum Note (Signed)
Addended by: Debbrah Alar on: 10/04/2014 01:13 PM   Modules accepted: Orders

## 2014-10-06 ENCOUNTER — Encounter: Payer: Self-pay | Admitting: Family

## 2014-10-11 ENCOUNTER — Ambulatory Visit: Payer: Managed Care, Other (non HMO) | Admitting: Family

## 2014-10-23 ENCOUNTER — Telehealth: Payer: Self-pay | Admitting: Family

## 2014-10-23 ENCOUNTER — Ambulatory Visit: Payer: Managed Care, Other (non HMO) | Admitting: Family

## 2014-10-30 ENCOUNTER — Ambulatory Visit (INDEPENDENT_AMBULATORY_CARE_PROVIDER_SITE_OTHER): Payer: Managed Care, Other (non HMO) | Admitting: Family

## 2014-10-30 ENCOUNTER — Other Ambulatory Visit (HOSPITAL_COMMUNITY)
Admission: RE | Admit: 2014-10-30 | Discharge: 2014-10-30 | Disposition: A | Discharge status: Home / Self Care | Payer: Managed Care, Other (non HMO) | Source: Ambulatory Visit | Attending: Family | Admitting: Family

## 2014-10-30 ENCOUNTER — Encounter: Payer: Self-pay | Admitting: Family

## 2014-10-30 VITALS — BP 120/78 | HR 83 | Temp 98.1°F | Resp 16 | Ht 65.0 in

## 2014-10-30 DIAGNOSIS — F329 Major depressive disorder, single episode, unspecified: Secondary | ICD-10-CM | POA: Diagnosis not present

## 2014-10-30 DIAGNOSIS — N760 Acute vaginitis: Secondary | ICD-10-CM | POA: Insufficient documentation

## 2014-10-30 DIAGNOSIS — Z113 Encounter for screening for infections with a predominantly sexual mode of transmission: Secondary | ICD-10-CM | POA: Diagnosis not present

## 2014-10-30 DIAGNOSIS — F32A Depression, unspecified: Secondary | ICD-10-CM

## 2014-10-30 MED ORDER — VENLAFAXINE HCL ER 37.5 MG PO CP24: ORAL_CAPSULE | ORAL | Status: DC

## 2014-10-30 NOTE — Addendum Note (Signed)
Addended by: Kelle Darting A on: 10/30/2014 06:09 PM   Modules accepted: Orders

## 2014-10-30 NOTE — Patient Instructions (Signed)
Start effexor 37.5 mg one tab by mouth daily for 3 days,then increase to 2 tabs by mouth once daily on 4th day. Follow up in 1 month.

## 2014-10-30 NOTE — Assessment & Plan Note (Signed)
Pt has been on effexor in the past and has tolerated. Advised pt as follows:  Start effexor 37.5 mg one tab by mouth daily for 3 days,then increase to 2 tabs by mouth once daily on 4th day. Go to ER if SI/HI occurs. Follow up in 1 month. Pt verbalizes understanding.

## 2014-10-30 NOTE — Progress Notes (Signed)
Subjective:    Patient ID: Nicole Quinn, female    DOB: May 03, 1975, 39 y.o.   MRN: [id number]  HPI  [name] is a 39 yr old female with hx of anxiety and depression, who presents today with reports of feeling irritable, tearful, crying a lot, can't fall asleep at night. Denies thoughts of hurting other or self.  Often will sleep until 12 or 1 on the weekends.   BP Readings from Last 3 Encounters:  10/30/14 120/78  06/20/14 120/82  04/10/14 126/77   Wt Readings from Last 3 Encounters:  06/20/14 190 lb (86.183 kg)  04/10/14 206 lb 12.8 oz (93.804 kg)  03/22/14 207 lb 12.8 oz (94.257 kg)   Reports some vaginal discharge. Requests STD testing.     Review of Systems See HPI  Past Medical History  Diagnosis Date  . Anemia   . Allergy   . Hypertension   . History of UTI   . Endometriosis     Social History   Social History  . Marital Status: Single    Spouse Name: N/A  . Number of Children: 2  . Years of Education: N/A   Occupational History  .      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 Smoking status: Never Smoker   . Smokeless tobacco: Never Used  . Alcohol Use: No  . Drug Use: Not on file  . Sexual Activity: Not on file   Other Topics Concern  . Not on file   Social History Narrative   Regular exercise: yes.   Lives with one child.  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            Past Surgical History  Procedure Laterality Date  . Abdominal hysterectomy  2003    partial  . Carpal tunnel release      right  . Ganglion cyst excision      right  . Hammer toe repair Bilateral 12/29/2013    Lt #3, #4, Rt #2.    Family History  Problem Relation Age of Onset  . Arthritis Mother   . Hyperlipidemia Mother   . Hypertension Mother   . Hypertension Father   . Diabetes Father   . Allergies Daughter   . Allergies Son   . Arthritis Maternal Grandmother   . Stroke Maternal Grandmother 50  . Hypertension Maternal  Grandmother   . Leukemia Paternal Grandmother   . Coronary artery disease Maternal Aunt 50  . Coronary artery disease Maternal Aunt 50    Allergies  Allergen Reactions  . Amoxicillin     rash  . Ace Inhibitors     REACTION: lip swelling    Current Outpatient Prescriptions on File Prior to Visit  Medication Sig Dispense Refill  . ALPRAZolam (XANAX) 0.25 MG tablet Take 1 tablet (0.25 mg total) by mouth 3 (three) times daily as needed for anxiety. 30 tablet 0  . furosemide (LASIX) 20 MG tablet Take 1 tablet (20 mg total) by mouth daily. 90 tablet 1  . hydrochlorothiazide (HYDRODIURIL) 25 MG tablet Take 1 tablet (25 mg total) by mouth daily. 90 tablet 1  . metoprolol succinate (TOPROL-XL) 25 MG 24 hr tablet TAKE ONE TABLET BY MOUTH ONCE DAILY 90 tablet 1  . Multiple Vitamin (MULTIVITAMIN) tablet Take 1 tablet by mouth daily.      . potassium chloride SA (K-DUR,KLOR-CON) 20 MEQ tablet Take 1 tablet (20 mEq total) by mouth daily. 30 tablet  2   No current facility-administered medications on file prior to visit.    BP 120/78 mmHg  Pulse 83  Temp(Src) 98.1 F (36.7 C) (Oral)  Resp 16  Ht 5\' 5"  (1.651 m)  SpO2 100%       Objective:   Physical Exam  Constitutional: She is oriented to person, place, and time. She appears well-developed and well-nourished. No distress.  Genitourinary: Vagina normal.  Neurological: She is alert and oriented to person, place, and time.  Psychiatric:  Slightly flat affect noted          Assessment & Plan:  Vaginitis- send wet prep. GC/Chlamydia, requests std testing- obtain rpr and hiv.

## 2014-10-30 NOTE — Progress Notes (Signed)
Pre visit review using our clinic review tool, if applicable. No additional management support is needed unless otherwise documented below in the visit note. 

## 2014-10-31 LAB — HIV ANTIBODY (ROUTINE TESTING W REFLEX): HIV 1&2 Ab, 4th Generation: NONREACTIVE

## 2014-10-31 LAB — RPR

## 2014-11-01 ENCOUNTER — Encounter: Payer: Self-pay | Admitting: Family

## 2014-11-01 ENCOUNTER — Telehealth: Payer: Self-pay | Admitting: Family

## 2014-11-01 LAB — CERVICOVAGINAL ANCILLARY ONLY
Chlamydia: NEGATIVE
Neisseria Gonorrhea: NEGATIVE
Trichomonas: NEGATIVE

## 2014-11-01 MED ORDER — ALPRAZOLAM 0.25 MG PO TABS: 0.2500 mg | ORAL_TABLET | Freq: Three times a day (TID) | ORAL | Status: DC | PRN

## 2014-11-01 NOTE — Addendum Note (Signed)
Addended by: Kelle Darting A on: 11/01/2014 02:01 PM   Modules accepted: Orders

## 2014-11-01 NOTE — Telephone Encounter (Signed)
No previous CSC or UDS on file. Will need to collect at f/u in November if not previously done. Rx printed and forwarded to PCP for signature.

## 2014-11-01 NOTE — Telephone Encounter (Signed)
Could you please check status of garderella?  thanks

## 2014-11-02 NOTE — Telephone Encounter (Signed)
Pt was no show 10/23/14 6:00pm, pt came in 10/30/14, 1st no show I see, charge or no charge?

## 2014-11-02 NOTE — Telephone Encounter (Signed)
No charge. 

## 2014-11-03 NOTE — Telephone Encounter (Signed)
Nicole Quinn-- I spoke with cytology on Wednesday. They said it would take a couple more days for yeast and BV as it would be send out due to specimen type that was submitted.

## 2014-11-03 NOTE — Telephone Encounter (Signed)
Rx faxed to pharmacy on 11/01/14 at 3:22pm.

## 2014-11-05 LAB — CERVICOVAGINAL ANCILLARY ONLY
Bacterial vaginitis: POSITIVE — AB
Candida vaginitis: NEGATIVE

## 2014-11-06 ENCOUNTER — Telehealth: Payer: Self-pay | Admitting: Family

## 2014-11-06 MED ORDER — TINIDAZOLE 500 MG PO TABS
500.0000 mg | ORAL_TABLET | Freq: Two times a day (BID) | ORAL | Status: DC
Start: 2014-11-06 — End: 2014-12-26

## 2014-11-06 NOTE — Telephone Encounter (Signed)
Sent mychart message to pt and left message on voicemail to check mychart account. GC/chlamydia, trich testing has resulted and is negative.

## 2014-11-06 NOTE — Telephone Encounter (Signed)
Lab work + for BV. Lets have her try tindamax this time.  GC/Chlamydia testing still pending.

## 2014-12-01 ENCOUNTER — Other Ambulatory Visit: Payer: Self-pay | Admitting: Family

## 2014-12-01 MED ORDER — ALPRAZOLAM 0.25 MG PO TABS: 0.2500 mg | ORAL_TABLET | Freq: Three times a day (TID) | ORAL | Status: DC | PRN

## 2014-12-01 NOTE — Addendum Note (Signed)
Addended by: Raiford Noble on: 12/01/2014 10:50 AM   Modules accepted: Orders

## 2014-12-01 NOTE — Telephone Encounter (Signed)
Ok to call in 30 tabs zero refills.  

## 2014-12-05 ENCOUNTER — Other Ambulatory Visit: Payer: Self-pay | Admitting: Family

## 2014-12-05 NOTE — Telephone Encounter (Signed)
Left msg for pt to schedule f/u within 30 days.

## 2014-12-05 NOTE — Telephone Encounter (Signed)
30 day supply sent in and pt will need to follow up before the medication is out. Please call pt to schedule an appointment.

## 2014-12-26 ENCOUNTER — Encounter: Payer: Self-pay | Admitting: Family

## 2014-12-26 ENCOUNTER — Ambulatory Visit (INDEPENDENT_AMBULATORY_CARE_PROVIDER_SITE_OTHER): Payer: Managed Care, Other (non HMO) | Admitting: Family

## 2014-12-26 VITALS — BP 150/98 | Ht 65.0 in | Wt 186.4 lb

## 2014-12-26 DIAGNOSIS — F418 Other specified anxiety disorders: Secondary | ICD-10-CM | POA: Diagnosis not present

## 2014-12-26 DIAGNOSIS — I10 Essential (primary) hypertension: Secondary | ICD-10-CM

## 2014-12-26 MED ORDER — HYDROCHLOROTHIAZIDE 25 MG PO TABS: 25.0000 mg | ORAL_TABLET | Freq: Every day | ORAL | Status: DC

## 2014-12-26 MED ORDER — METOPROLOL SUCCINATE ER 25 MG PO TB24: 50.0000 mg | ORAL_TABLET | Freq: Every day | ORAL | Status: DC

## 2014-12-26 MED ORDER — FUROSEMIDE 20 MG PO TABS: 20.0000 mg | ORAL_TABLET | Freq: Every day | ORAL | Status: DC

## 2014-12-26 NOTE — Assessment & Plan Note (Signed)
Stable/improved on effexor. Continue same.  I reviewed her historical weights and her weight is actually down today.

## 2014-12-26 NOTE — Progress Notes (Signed)
Subjective:    Patient ID: Nicole Quinn, female    DOB: 1975-10-21, 39 y.o.   MRN: [id number]  HPI  Nicole Quinn is a 39 yr old female who presents today to discuss weight gain.  Wt Readings from Last 3 Encounters:  06/20/14 190 lb (86.183 kg)  04/10/14 206 lb 12.8 oz (93.804 kg)  03/22/14 207 lb 12.8 oz (94.257 kg)   186.4 lb today.   Pt reports that she continues to exercise regularly and watch her diet closely.  Anxiety/depression is better since she began effexor.   Notes occasional HA's, nausea, however the side effects are not bad enough for her to stop the medication.   HTN- Reports + compliance with BP meds.  BP Readings from Last 3 Encounters:  12/26/14 150/98  10/30/14 120/78  06/20/14 120/82    Review of Systems See HPI  Past Medical History  Diagnosis Date  . Anemia   . Allergy   . Hypertension   . History of UTI   . Endometriosis     Social History   Social History  . Marital Status: Single    Spouse Name: N/A  . Number of Children: 2  . Years of Education: N/A   Occupational History  .      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 Smoking status: Never Smoker   . Smokeless tobacco: Never Used  . Alcohol Use: No  . Drug Use: Not on file  . Sexual Activity: Not on file   Other Topics Concern  . Not on file   Social History Narrative   Regular exercise: yes.   Lives with one child.  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            Past Surgical History  Procedure Laterality Date  . Abdominal hysterectomy  2003    partial  . Carpal tunnel release      right  . Ganglion cyst excision      right  . Hammer toe repair Bilateral 12/29/2013    Lt #3, #4, Rt #2.    Family History  Problem Relation Age of Onset  . Arthritis Mother   . Hyperlipidemia Mother   . Hypertension Mother   . Hypertension Father   . Diabetes Father   . Allergies Daughter   . Allergies Son   . Arthritis Maternal Grandmother     . Stroke Maternal Grandmother 50  . Hypertension Maternal Grandmother   . Leukemia Paternal Grandmother   . Coronary artery disease Maternal Aunt 50  . Coronary artery disease Maternal Aunt 50    Allergies  Allergen Reactions  . Amoxicillin     rash  . Ace Inhibitors     REACTION: lip swelling    Current Outpatient Prescriptions on File Prior to Visit  Medication Sig Dispense Refill  . ALPRAZolam (XANAX) 0.25 MG tablet Take 1 tablet (0.25 mg total) by mouth 3 (three) times daily as needed for anxiety. 30 tablet 0  . Multiple Vitamin (MULTIVITAMIN) tablet Take 1 tablet by mouth daily.      . potassium chloride SA (K-DUR,KLOR-CON) 20 MEQ tablet Take 1 tablet (20 mEq total) by mouth daily. 30 tablet 2  . venlafaxine XR (EFFEXOR XR) 37.5 MG 24 hr capsule One tab by mouth once daily for 3 days, then increase to 2 tabs once daily 60 capsule 1   No current facility-administered medications on file prior to visit.    BP  150/98 mmHg  Ht 5\' 5"  (1.651 m)  Wt 186 lb 6.4 oz (84.55 kg)  BMI 31.02 kg/m2       Objective:   Physical Exam  Constitutional: She appears well-developed and well-nourished.  Cardiovascular: Normal rate, regular rhythm and normal heart sounds.   No murmur heard. Pulmonary/Chest: Effort normal and breath sounds normal. No respiratory distress. She has no wheezes.  Musculoskeletal:  Trace bilateral LE edema.   Psychiatric: She has a normal mood and affect. Her behavior is normal. Judgment and thought content normal.          Assessment & Plan:

## 2014-12-26 NOTE — Assessment & Plan Note (Signed)
BP up, increase toprol xl from 25 mg to 50mg .

## 2014-12-26 NOTE — Patient Instructions (Signed)
Please schedule nurse visit in 2 weeks with lab work.   Increase metoprolol to 50mg  (2 tabs once daily).

## 2014-12-26 NOTE — Progress Notes (Signed)
Pre visit review using our clinic review tool, if applicable. No additional management support is needed unless otherwise documented below in the visit note. 

## 2014-12-26 NOTE — Assessment & Plan Note (Signed)
>>  ASSESSMENT AND PLAN FOR ANXIETY ASSOCIATED WITH DEPRESSION WRITTEN ON 12/26/2014 10:21 PM BY O'SULLIVAN, Aveya Beal, NP  Stable/improved on effexor. Continue same.  I reviewed her historical weights and her weight is actually down today.

## 2015-01-08 ENCOUNTER — Encounter: Payer: Self-pay | Admitting: Family

## 2015-01-08 ENCOUNTER — Other Ambulatory Visit (INDEPENDENT_AMBULATORY_CARE_PROVIDER_SITE_OTHER): Payer: Managed Care, Other (non HMO)

## 2015-01-08 ENCOUNTER — Telehealth: Payer: Self-pay | Admitting: Family

## 2015-01-08 ENCOUNTER — Ambulatory Visit (INDEPENDENT_AMBULATORY_CARE_PROVIDER_SITE_OTHER): Payer: Managed Care, Other (non HMO) | Admitting: Family

## 2015-01-08 VITALS — BP 137/90 | HR 94 | Temp 98.0°F | Resp 18 | Ht 65.0 in | Wt 190.0 lb

## 2015-01-08 DIAGNOSIS — R300 Dysuria: Secondary | ICD-10-CM | POA: Diagnosis not present

## 2015-01-08 DIAGNOSIS — I10 Essential (primary) hypertension: Secondary | ICD-10-CM

## 2015-01-08 DIAGNOSIS — R109 Unspecified abdominal pain: Secondary | ICD-10-CM | POA: Diagnosis not present

## 2015-01-08 DIAGNOSIS — E876 Hypokalemia: Secondary | ICD-10-CM

## 2015-01-08 LAB — POCT URINALYSIS DIPSTICK
Bilirubin, UA: NEGATIVE
Blood, UA: NEGATIVE
Glucose, UA: NEGATIVE
Leukocytes, UA: NEGATIVE
Nitrite, UA: NEGATIVE
Spec Grav, UA: 1.025
Urobilinogen, UA: NEGATIVE
pH, UA: 6

## 2015-01-08 LAB — BASIC METABOLIC PANEL WITH GFR
BUN: 16 mg/dL (ref 6–23)
CO2: 31 meq/L (ref 19–32)
Calcium: 9.2 mg/dL (ref 8.4–10.5)
Chloride: 101 meq/L (ref 96–112)
Creatinine, Ser: 0.84 mg/dL (ref 0.40–1.20)
GFR: 97.67 mL/min
Glucose, Bld: 79 mg/dL (ref 70–99)
Potassium: 2.9 meq/L — ABNORMAL LOW (ref 3.5–5.1)
Sodium: 141 meq/L (ref 135–145)

## 2015-01-08 MED ORDER — CIPROFLOXACIN HCL 500 MG PO TABS: 500.0000 mg | ORAL_TABLET | Freq: Two times a day (BID) | ORAL | Status: DC

## 2015-01-08 MED ORDER — POTASSIUM CHLORIDE CRYS ER 20 MEQ PO TBCR: 20.0000 meq | EXTENDED_RELEASE_TABLET | Freq: Every day | ORAL | Status: DC

## 2015-01-08 NOTE — Telephone Encounter (Signed)
Notified pt and she voices understanding. Lab appt scheduled for 01/16/15 at 9:30am, lab order entered.  Pt also states she has had lower right side back pain x 2 weeks and dysuria x 2 days. Can pt come in and provide urine sample or will she need office visit?

## 2015-01-08 NOTE — Patient Instructions (Addendum)
Please start cipro twice daily for possible kidney infection. Call if symptoms worsen or if not improved in 2-3 days.  You may use tylenol or motrin as needed for pain.

## 2015-01-08 NOTE — Telephone Encounter (Signed)
Pt needs office visit please 

## 2015-01-08 NOTE — Telephone Encounter (Signed)
Notified pt and schedule appt for today for 5:45pm.

## 2015-01-08 NOTE — Addendum Note (Signed)
Addended by: Kelle Darting A on: 01/08/2015 07:49 PM   Modules accepted: Orders

## 2015-01-08 NOTE — Progress Notes (Signed)
Pre visit review using our clinic review tool, if applicable. No additional management support is needed unless otherwise documented below in the visit note. 

## 2015-01-08 NOTE — Telephone Encounter (Signed)
Please let pt know that K is very low.  I would recommend that she take 2 tabs of K dur now, 1 tab this evening, then continue 1 tab by mouth once daily.  Follow up bmet in 1 week

## 2015-01-08 NOTE — Progress Notes (Signed)
Subjective:    Patient ID: Nicole Quinn, female    DOB: [birth year], 39 y.o.   MRN: [id number]  HPI   Nicole Quinn is a 39 yr old female who presents today with chief complaint of right sided low back pain.  Pain has been present x 2 weeks. Dysuria x 2 days. Urine has been darker than usual despite good intake of water. She denies fever. Denies nausea/vomitting.     Review of Systems See HPI  Past Medical History  Diagnosis Date  . Anemia   . Allergy   . Hypertension   . History of UTI   . Endometriosis     Social History   Social History  . Marital Status: Single    Spouse Name: N/A  . Number of Children: 2  . Years of Education: N/A   Occupational History  .      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 Smoking status: Never Smoker   . Smokeless tobacco: Never Used  . Alcohol Use: No  . Drug Use: Not on file  . Sexual Activity: Not on file   Other Topics Concern  . Not on file   Social History Narrative   Regular exercise: yes.   Lives with one child.  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            Past Surgical History  Procedure Laterality Date  . Abdominal hysterectomy  2003    partial  . Carpal tunnel release      right  . Ganglion cyst excision      right  . Hammer toe repair Bilateral 12/29/2013    Lt #3, #4, Rt #2.    Family History  Problem Relation Age of Onset  . Arthritis Mother   . Hyperlipidemia Mother   . Hypertension Mother   . Hypertension Father   . Diabetes Father   . Allergies Daughter   . Allergies Son   . Arthritis Maternal Grandmother   . Stroke Maternal Grandmother 50  . Hypertension Maternal Grandmother   . Leukemia Paternal Grandmother   . Coronary artery disease Maternal Aunt 50  . Coronary artery disease Maternal Aunt 50    Allergies  Allergen Reactions  . Amoxicillin     rash  . Ace Inhibitors     REACTION: lip swelling    Current Outpatient Prescriptions on File Prior to  Visit  Medication Sig Dispense Refill  . ALPRAZolam (XANAX) 0.25 MG tablet Take 1 tablet (0.25 mg total) by mouth 3 (three) times daily as needed for anxiety. 30 tablet 0  . furosemide (LASIX) 20 MG tablet Take 1 tablet (20 mg total) by mouth daily. 90 tablet 1  . hydrochlorothiazide (HYDRODIURIL) 25 MG tablet Take 1 tablet (25 mg total) by mouth daily. 90 tablet 1  . metoprolol succinate (TOPROL-XL) 25 MG 24 hr tablet Take 2 tablets (50 mg total) by mouth daily. 90 tablet 1  . Multiple Vitamin (MULTIVITAMIN) tablet Take 1 tablet by mouth daily.      . potassium chloride SA (K-DUR,KLOR-CON) 20 MEQ tablet Take 1 tablet (20 mEq total) by mouth daily. 30 tablet 2  . venlafaxine XR (EFFEXOR XR) 37.5 MG 24 hr capsule One tab by mouth once daily for 3 days, then increase to 2 tabs once daily 60 capsule 1   No current facility-administered medications on file prior to visit.    BP 137/90 mmHg  Pulse 94  Temp(Src) 98  F (36.7 C) (Oral)  Resp 18  Ht 5\' 5"  (1.651 m)  Wt 190 lb (86.183 kg)  BMI 31.62 kg/m2  SpO2 100%       Objective:   Physical Exam  Constitutional: She is oriented to person, place, and time. She appears well-developed and well-nourished.  Cardiovascular: Normal rate, regular rhythm and normal heart sounds.   No murmur heard. Pulmonary/Chest: Effort normal and breath sounds normal. No respiratory distress. She has no wheezes.  Genitourinary:  + R CVAT Neg L CVAT  Musculoskeletal: She exhibits no edema.  Neurological: She is alert and oriented to person, place, and time.  Psychiatric: She has a normal mood and affect. Her behavior is normal. Judgment and thought content normal.          Assessment & Plan:  Flank pain- UA unremarkable, but hx and exam concerning for possible pyelo. Rx with cipro and will send urine for culture. Pt is advised to call if symptoms worsen or if symptoms are not improved in 2-3 day.

## 2015-01-10 LAB — URINE CULTURE
Colony Count: NO GROWTH
Organism ID, Bacteria: NO GROWTH

## 2015-01-11 ENCOUNTER — Encounter: Payer: Self-pay | Admitting: Family

## 2015-01-12 ENCOUNTER — Telehealth: Payer: Self-pay | Admitting: Family

## 2015-01-12 NOTE — Telephone Encounter (Signed)
See my chart message

## 2015-01-16 ENCOUNTER — Other Ambulatory Visit (INDEPENDENT_AMBULATORY_CARE_PROVIDER_SITE_OTHER): Payer: Managed Care, Other (non HMO)

## 2015-01-16 DIAGNOSIS — E876 Hypokalemia: Secondary | ICD-10-CM | POA: Diagnosis not present

## 2015-01-16 LAB — BASIC METABOLIC PANEL
BUN: 15 mg/dL (ref 6–23)
CO2: 29 mEq/L (ref 19–32)
Calcium: 9.2 mg/dL (ref 8.4–10.5)
Chloride: 103 mEq/L (ref 96–112)
Creatinine, Ser: 0.77 mg/dL (ref 0.40–1.20)
GFR: 107.97 mL/min (ref >60.00)
Glucose, Bld: 85 mg/dL (ref 70–99)
Potassium: 3.1 mEq/L — ABNORMAL LOW (ref 3.5–5.1)
Sodium: 140 mEq/L (ref 135–145)

## 2015-01-18 ENCOUNTER — Other Ambulatory Visit: Payer: Self-pay | Admitting: Family

## 2015-01-18 ENCOUNTER — Other Ambulatory Visit: Payer: Self-pay | Admitting: General Practice

## 2015-01-18 DIAGNOSIS — E876 Hypokalemia: Secondary | ICD-10-CM

## 2015-01-18 MED ORDER — POTASSIUM CHLORIDE CRYS ER 20 MEQ PO TBCR: 20.0000 meq | EXTENDED_RELEASE_TABLET | Freq: Two times a day (BID) | ORAL | Status: DC

## 2015-01-26 ENCOUNTER — Telehealth: Payer: Self-pay | Admitting: Physician Assistant

## 2015-01-26 MED ORDER — ALPRAZOLAM 0.25 MG PO TABS: 0.2500 mg | ORAL_TABLET | Freq: Three times a day (TID) | ORAL | Status: DC | PRN

## 2015-01-26 NOTE — Telephone Encounter (Signed)
Rx faxed to pharmacy  

## 2015-01-26 NOTE — Telephone Encounter (Signed)
Last Rx given 12/01/14, #30.  Last UDS 01/08/15,  Next UDS due 04/08/15.  Rx printed and forwarded to PCP for signature.

## 2015-02-23 ENCOUNTER — Encounter: Payer: Self-pay | Admitting: Family

## 2015-02-23 DIAGNOSIS — L700 Acne vulgaris: Secondary | ICD-10-CM

## 2015-02-26 ENCOUNTER — Telehealth: Payer: Self-pay | Admitting: Family

## 2015-02-26 MED ORDER — BENZOYL PEROXIDE-ERYTHROMYCIN 5-3 % EX GEL: Freq: Two times a day (BID) | CUTANEOUS | Status: DC

## 2015-02-26 MED ORDER — MINOCYCLINE HCL 50 MG PO TABS: 50.0000 mg | ORAL_TABLET | Freq: Two times a day (BID) | ORAL | Status: DC

## 2015-02-26 NOTE — Telephone Encounter (Signed)
Caller name: Hailey   Relationship to patient: Pharmacist   Can be reached: [phone]  Pharmacy: [facility] [place], [place]  Reason for call: she would like to know if pt can be switched to a less expensive medication for her [facility] Rx.    Fax: [phone]

## 2015-02-26 NOTE — Telephone Encounter (Signed)
Rx sent for oral minocycline.  Will defer further treatment to derm after she sees them.

## 2015-02-26 NOTE — Addendum Note (Signed)
Addended by: Debbrah Alar on: 02/26/2015 01:15 PM   Modules accepted: Orders

## 2015-02-27 NOTE — Telephone Encounter (Signed)
No further recs at this time.  

## 2015-02-27 NOTE — Telephone Encounter (Signed)
Spoke with pharmacy and they state that minocycline will still cost pt $128. Advised her further recommendations need to come from dermatology. Please advise if any further advice?

## 2015-03-08 ENCOUNTER — Encounter: Payer: Self-pay | Admitting: Family

## 2015-05-14 ENCOUNTER — Other Ambulatory Visit: Payer: Self-pay | Admitting: Family

## 2015-05-15 ENCOUNTER — Encounter: Payer: Self-pay | Admitting: Family

## 2015-05-15 ENCOUNTER — Other Ambulatory Visit: Payer: Self-pay | Admitting: Family

## 2015-05-15 NOTE — Telephone Encounter (Signed)
Spoke with pharmacy. They state pt received #90 in December and then refilled Rx for #30 in February, March and April. They are unsure if pt lost part of original Rx but they are showing refills are now expired. Spoke with pt. She thought the pharmacy has only been giving her a 30 day supply at a time and states she is taking each medication once a day. Please advise refills? Pt last seen 12/2014 and has no future appts scheduled. When should pt f/u in office ?

## 2015-05-15 NOTE — Telephone Encounter (Signed)
Caller name: Self    Pharmacy:  [facility] [place], [place] - [address] [phone] (Phone) [phone] (Fax)         Reason for call: Request refills on hydrochlorothiazide (HYDRODIURIL) 25 MG tablet [id number] and furosemide (LASIX) 20 MG tablet [id number]

## 2015-05-16 MED ORDER — HYDROCHLOROTHIAZIDE 25 MG PO TABS: 25.0000 mg | ORAL_TABLET | Freq: Every day | ORAL | Status: DC

## 2015-05-16 MED ORDER — FUROSEMIDE 20 MG PO TABS: 20.0000 mg | ORAL_TABLET | Freq: Every day | ORAL | Status: DC

## 2015-05-16 NOTE — Telephone Encounter (Signed)
Refills sent. Lets see her for 6 month follow up.

## 2015-05-16 NOTE — Telephone Encounter (Signed)
Mychart message sent to pt to schedule her 6 month follow up in June.

## 2015-07-12 ENCOUNTER — Ambulatory Visit (INDEPENDENT_AMBULATORY_CARE_PROVIDER_SITE_OTHER): Payer: PRIVATE HEALTH INSURANCE | Admitting: Family

## 2015-07-12 ENCOUNTER — Telehealth: Payer: Self-pay | Admitting: Emergency Medicine

## 2015-07-12 ENCOUNTER — Encounter: Payer: Self-pay | Admitting: Family

## 2015-07-12 ENCOUNTER — Other Ambulatory Visit: Payer: Self-pay | Admitting: Family

## 2015-07-12 VITALS — BP 138/98 | HR 88 | Temp 98.4°F | Resp 18 | Ht 65.0 in | Wt 185.0 lb

## 2015-07-12 DIAGNOSIS — F418 Other specified anxiety disorders: Secondary | ICD-10-CM | POA: Diagnosis not present

## 2015-07-12 DIAGNOSIS — E876 Hypokalemia: Secondary | ICD-10-CM

## 2015-07-12 DIAGNOSIS — I10 Essential (primary) hypertension: Secondary | ICD-10-CM

## 2015-07-12 DIAGNOSIS — R5383 Other fatigue: Secondary | ICD-10-CM | POA: Diagnosis not present

## 2015-07-12 DIAGNOSIS — R7989 Other specified abnormal findings of blood chemistry: Secondary | ICD-10-CM | POA: Diagnosis not present

## 2015-07-12 DIAGNOSIS — R945 Abnormal results of liver function studies: Secondary | ICD-10-CM

## 2015-07-12 LAB — CBC WITH DIFFERENTIAL/PLATELET
Basophils Absolute: 0 10*3/uL (ref 0.0–0.1)
Basophils Relative: 0.4 % (ref 0.0–3.0)
Eosinophils Absolute: 0 10*3/uL (ref 0.0–0.7)
Eosinophils Relative: 0.2 % (ref 0.0–5.0)
HCT: 37 % (ref 36.0–46.0)
Hemoglobin: 12.5 g/dL (ref 12.0–15.0)
Lymphocytes Relative: 41 % (ref 12.0–46.0)
Lymphs Abs: 3.2 10*3/uL (ref 0.7–4.0)
MCHC: 33.8 g/dL (ref 30.0–36.0)
MCV: 90.5 fl (ref 78.0–100.0)
Monocytes Absolute: 0.4 10*3/uL (ref 0.1–1.0)
Monocytes Relative: 5.3 % (ref 3.0–12.0)
Neutro Abs: 4.1 10*3/uL (ref 1.4–7.7)
Neutrophils Relative %: 53.1 % (ref 43.0–77.0)
Platelets: 358 10*3/uL (ref 150.0–400.0)
RBC: 4.09 Mil/uL (ref 3.87–5.11)
RDW: 13.1 % (ref 11.5–15.5)
WBC: 7.7 10*3/uL (ref 4.0–10.5)

## 2015-07-12 LAB — BASIC METABOLIC PANEL
BUN: 17 mg/dL (ref 6–23)
CO2: 31 mEq/L (ref 19–32)
Calcium: 9.7 mg/dL (ref 8.4–10.5)
Chloride: 96 mEq/L (ref 96–112)
Creatinine, Ser: 0.94 mg/dL (ref 0.40–1.20)
GFR: 85.55 mL/min (ref >60.00)
Glucose, Bld: 88 mg/dL (ref 70–99)
Potassium: 2.5 mEq/L — CL (ref 3.5–5.1)
Sodium: 134 mEq/L — ABNORMAL LOW (ref 135–145)

## 2015-07-12 LAB — TSH: TSH: 0.74 u[IU]/mL (ref 0.35–4.50)

## 2015-07-12 LAB — HEPATIC FUNCTION PANEL
ALT: 16 U/L (ref 0–35)
AST: 24 U/L (ref 0–37)
Albumin: 4.5 g/dL (ref 3.5–5.2)
Alkaline Phosphatase: 70 U/L (ref 39–117)
Bilirubin, Direct: 0 mg/dL (ref 0.0–0.3)
Total Bilirubin: 0.3 mg/dL (ref 0.2–1.2)
Total Protein: 8 g/dL (ref 6.0–8.3)

## 2015-07-12 MED ORDER — HYDROCHLOROTHIAZIDE 25 MG PO TABS: 25.0000 mg | ORAL_TABLET | Freq: Every day | ORAL | Status: DC

## 2015-07-12 MED ORDER — METOPROLOL SUCCINATE ER 25 MG PO TB24: 50.0000 mg | ORAL_TABLET | Freq: Every day | ORAL | Status: DC

## 2015-07-12 MED ORDER — METOPROLOL SUCCINATE ER 25 MG PO TB24: 75.0000 mg | ORAL_TABLET | Freq: Every day | ORAL | Status: DC

## 2015-07-12 MED ORDER — VENLAFAXINE HCL ER 37.5 MG PO CP24: ORAL_CAPSULE | ORAL | Status: DC

## 2015-07-12 MED ORDER — FUROSEMIDE 20 MG PO TABS
20.0000 mg | ORAL_TABLET | Freq: Every day | ORAL | Status: DC | PRN
Start: 2015-07-12 — End: 2015-11-16

## 2015-07-12 MED ORDER — ALPRAZOLAM 0.25 MG PO TABS
0.2500 mg | ORAL_TABLET | Freq: Three times a day (TID) | ORAL | Status: DC | PRN
Start: 2015-07-12 — End: 2015-08-17

## 2015-07-12 MED ORDER — POTASSIUM CHLORIDE CRYS ER 20 MEQ PO TBCR: EXTENDED_RELEASE_TABLET | ORAL | Status: DC

## 2015-07-12 NOTE — Assessment & Plan Note (Signed)
>>  ASSESSMENT AND PLAN FOR ANXIETY ASSOCIATED WITH DEPRESSION WRITTEN ON 07/12/2015  1:11 PM BY O'SULLIVAN, Matalie Romberger, NP  Uncontrolled. Restart effexor.

## 2015-07-12 NOTE — Telephone Encounter (Signed)
Message read by pt at 2:54pm.

## 2015-07-12 NOTE — Assessment & Plan Note (Signed)
DBP is elevated.  I have asked pt to increase her metoprolol to 75mg  once daily.

## 2015-07-12 NOTE — Telephone Encounter (Signed)
Attempted to reach pt and left detailed message on her cell# re: below instructions. Also sent information via mychart. Lab order entered.

## 2015-07-12 NOTE — Telephone Encounter (Signed)
[id number] from [facility] called w/critical low Potassium of 2.5

## 2015-07-12 NOTE — Progress Notes (Signed)
Pre visit review using our clinic review tool, if applicable. No additional management support is needed unless otherwise documented below in the visit note. 

## 2015-07-12 NOTE — Assessment & Plan Note (Signed)
Uncontrolled. Restart effexor.

## 2015-07-12 NOTE — Progress Notes (Signed)
Subjective:    Patient ID: Nicole Quinn, female    DOB: [birth year], 40 y.o.   MRN: [id number]  HPI  [name] is a 40 yr old female who presents today for follow up.  1) HTN- on hctz and toprol xl. Uses lasix prn, usually 5 days a week.   BP Readings from Last 3 Encounters:  07/12/15 138/98  01/08/15 137/90  12/26/14 150/98   2) Anxiety/depression- sometimes can have some irritability sometimes.  Denies panic attacks. Rarely uses xanax.  Only about 1-2 times a week. Ran out of effexor a few months ago, was feeling better and wishes to restart.   3) Fatigue-reports good diet/regular cardio. Exhausted.  + snoring.  Not bad per husband. No report of witnessed apnea. Never falls asleep during the day unintentionally.    4) abnormal LFT- Reports that she was told that LFT's were briefly elevated then returned to normal. She reports that she had a lot of LE edema.    Review of Systems    see HPI  Past Medical History  Diagnosis Date  . Anemia   . Allergy   . Hypertension   . History of UTI   . Endometriosis      Social History   Social History  . Marital Status: Single    Spouse Name: N/A  . Number of Children: 2  . Years of Education: N/A   Occupational History  .      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 Smoking status: Never Smoker   . Smokeless tobacco: Never Used  . Alcohol Use: No  . Drug Use: Not on file  . Sexual Activity: Not on file   Other Topics Concern  . Not on file   Social History Narrative   Regular exercise: yes.   Lives with one child.  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            Past Surgical History  Procedure Laterality Date  . Abdominal hysterectomy  2003    partial  . Carpal tunnel release      right  . Ganglion cyst excision      right  . Hammer toe repair Bilateral 12/29/2013    Lt #3, #4, Rt #2.    Family History  Problem Relation Age of Onset  . Arthritis Mother   .  Hyperlipidemia Mother   . Hypertension Mother   . Hypertension Father   . Diabetes Father   . Allergies Daughter   . Allergies Son   . Arthritis Maternal Grandmother   . Stroke Maternal Grandmother 50  . Hypertension Maternal Grandmother   . Leukemia Paternal Grandmother   . Coronary artery disease Maternal Aunt 50  . Coronary artery disease Maternal Aunt 50    Allergies  Allergen Reactions  . Amoxicillin     rash  . Ace Inhibitors     REACTION: lip swelling    Current Outpatient Prescriptions on File Prior to Visit  Medication Sig Dispense Refill  . ALPRAZolam (XANAX) 0.25 MG tablet Take 1 tablet (0.25 mg total) by mouth 3 (three) times daily as needed for anxiety. 30 tablet 0  . furosemide (LASIX) 20 MG tablet Take 1 tablet (20 mg total) by mouth daily. 30 tablet 2  . hydrochlorothiazide (HYDRODIURIL) 25 MG tablet Take 1 tablet (25 mg total) by mouth daily. 30 tablet 2  . metoprolol succinate (TOPROL-XL) 25 MG 24 hr tablet Take 2 tablets (50  mg total) by mouth daily. 90 tablet 1  . Multiple Vitamin (MULTIVITAMIN) tablet Take 1 tablet by mouth daily.      Marland Kitchen venlafaxine XR (EFFEXOR XR) 37.5 MG 24 hr capsule One tab by mouth once daily for 3 days, then increase to 2 tabs once daily 60 capsule 1   No current facility-administered medications on file prior to visit.    BP 138/98 mmHg  Pulse 88  Temp(Src) 98.4 F (36.9 C) (Oral)  Resp 18  Ht 5\' 5"  (1.651 m)  Wt 185 lb (83.915 kg)  BMI 30.79 kg/m2  SpO2 100%    Objective:   Physical Exam  Constitutional: She is oriented to person, place, and time. She appears well-developed and well-nourished.  HENT:  Head: Normocephalic and atraumatic.  Cardiovascular: Normal rate, regular rhythm and normal heart sounds.   No murmur heard. Pulmonary/Chest: Effort normal and breath sounds normal. No respiratory distress. She has no wheezes.  Musculoskeletal: She exhibits no edema.  Neurological: She is alert and oriented to person,  place, and time.  Psychiatric: She has a normal mood and affect. Her behavior is normal. Judgment and thought content normal.          Assessment & Plan:  Abnormal LFT- repeat today.  Fatigue- will obtain cbc, bmet, tsh to further evaluate.

## 2015-07-12 NOTE — Patient Instructions (Signed)
Please complete lab work prior to leaving. Restart effexor.

## 2015-07-12 NOTE — Telephone Encounter (Signed)
Received critical low potassium 2.5 result. Pt is not currently on Kdur.  I would like her to take Kdur 2mEQ two tabs now and 2 tabs in 4 hours.  1 tab PO daily beginning tomorrow AM, repeat bmet tomorrow afternoon.

## 2015-07-30 ENCOUNTER — Encounter: Payer: Self-pay | Admitting: Family

## 2015-07-31 ENCOUNTER — Encounter: Payer: Self-pay | Admitting: Family

## 2015-07-31 ENCOUNTER — Ambulatory Visit (INDEPENDENT_AMBULATORY_CARE_PROVIDER_SITE_OTHER): Payer: PRIVATE HEALTH INSURANCE | Admitting: Family

## 2015-07-31 VITALS — BP 112/70 | HR 74 | Temp 97.9°F | Ht 65.0 in | Wt 188.2 lb

## 2015-07-31 DIAGNOSIS — L723 Sebaceous cyst: Secondary | ICD-10-CM

## 2015-07-31 DIAGNOSIS — H10022 Other mucopurulent conjunctivitis, left eye: Secondary | ICD-10-CM | POA: Diagnosis not present

## 2015-07-31 MED ORDER — CIPROFLOXACIN HCL 0.3 % OP SOLN: OPHTHALMIC | Status: DC

## 2015-07-31 MED ORDER — CEPHALEXIN 500 MG PO CAPS: 500.0000 mg | ORAL_CAPSULE | Freq: Four times a day (QID) | ORAL | Status: DC

## 2015-07-31 NOTE — Patient Instructions (Signed)
Please keep your contacts out for the next week due to pink eye. Begin ciloxan eye drops. For the cysts beneath your arms, please begin keflex (antibiotic). You may apply warm compresses twice daily beneath arms. Call if symptoms worsen or do not improve.

## 2015-07-31 NOTE — Progress Notes (Signed)
Pre visit review using our clinic review tool, if applicable. No additional management support is needed unless otherwise documented below in the visit note. 

## 2015-07-31 NOTE — Progress Notes (Signed)
Subjective:     Patient ID: Nicole Quinn, female   DOB: 22-May-1975, 40 y.o.   MRN: [id number]  HPI   Reports knot left axilla x 1 week, started out sore and then became larger. Reports that she also has knot beneath the right arm.  Neither ar draining.  Left eye redness- started Sunday.  Reports no discharge, pain,itching or vision changes.     Review of Systems    see HPI  Past Medical History  Diagnosis Date  . Anemia   . Allergy   . Hypertension   . History of UTI   . Endometriosis      Social History   Social History  . Marital Status: Single    Spouse Name: N/A  . Number of Children: 2  . Years of Education: N/A   Occupational History  .      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 Smoking status: Never Smoker   . Smokeless tobacco: Never Used  . Alcohol Use: No  . Drug Use: Not on file  . Sexual Activity: Not on file   Other Topics Concern  . Not on file   Social History Narrative   Regular exercise: yes.   Lives with one child.  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            Past Surgical History  Procedure Laterality Date  . Abdominal hysterectomy  2003    partial  . Carpal tunnel release      right  . Ganglion cyst excision      right  . Hammer toe repair Bilateral 12/29/2013    Lt #3, #4, Rt #2.    Family History  Problem Relation Age of Onset  . Arthritis Mother   . Hyperlipidemia Mother   . Hypertension Mother   . Hypertension Father   . Diabetes Father   . Allergies Daughter   . Allergies Son   . Arthritis Maternal Grandmother   . Stroke Maternal Grandmother 50  . Hypertension Maternal Grandmother   . Leukemia Paternal Grandmother   . Coronary artery disease Maternal Aunt 50  . Coronary artery disease Maternal Aunt 50    Allergies  Allergen Reactions  . Amoxicillin     rash  . Ace Inhibitors     REACTION: lip swelling    Current Outpatient Prescriptions on File Prior to Visit   Medication Sig Dispense Refill  . ALPRAZolam (XANAX) 0.25 MG tablet Take 1 tablet (0.25 mg total) by mouth 3 (three) times daily as needed for anxiety. 30 tablet 0  . CLARAVIS 40 MG capsule Take 2 tablets by mouth daily.    . furosemide (LASIX) 20 MG tablet Take 1 tablet (20 mg total) by mouth daily as needed. 30 tablet 5  . hydrochlorothiazide (HYDRODIURIL) 25 MG tablet Take 1 tablet (25 mg total) by mouth daily. 90 tablet 1  . metoprolol succinate (TOPROL-XL) 25 MG 24 hr tablet Take 3 tablets (75 mg total) by mouth daily. 270 tablet 1  . Multiple Vitamin (MULTIVITAMIN) tablet Take 1 tablet by mouth daily.      . potassium chloride SA (K-DUR,KLOR-CON) 20 MEQ tablet 2 tabs today (6/22) followed by 2 tabs in 4 hours, then continue one tab by mouth once daily 33 tablet 0  . Scar Treatment Products (RECEDO) GEL Apply once daily    . TRULANCE 3 MG TABS Take 1 tablet by mouth daily.    Marland Kitchen venlafaxine XR (  EFFEXOR XR) 37.5 MG 24 hr capsule One tab by mouth once daily for 3 days, then increase to 2 tabs once daily 60 capsule 0   No current facility-administered medications on file prior to visit.    BP 112/70 mmHg  Pulse 74  Temp(Src) 97.9 F (36.6 C) (Oral)  Ht 5\' 5"  (1.651 m)  Wt 188 lb 3.2 oz (85.367 kg)  BMI 31.32 kg/m2  SpO2 98%    Objective:   Physical Exam  Constitutional: She appears well-developed and well-nourished.  HENT:  Head: Normocephalic and atraumatic.  Eyes: Pupils are equal, round, and reactive to light. Left conjunctiva is injected.  Cardiovascular: Normal rate, regular rhythm and normal heart sounds.   No murmur heard. Pulmonary/Chest: Effort normal and breath sounds normal. No respiratory distress. She has no wheezes.  Skin:  + tender cyst beneath left axilla, smaller less tender cyst beneath right axilla  Psychiatric: She has a normal mood and affect. Her behavior is normal. Judgment and thought content normal.       Assessment:     Pink Eye  Right and  left sebaceous axillary cysts      Plan:     For pink eye- advised pt to remove contact x 1 week. Begin ciloxan.    For axillary cysts- advised pt to begin keflex, warm compresses, call if symptoms worsen or do not improve.

## 2015-08-11 ENCOUNTER — Other Ambulatory Visit: Payer: Self-pay | Admitting: Family

## 2015-08-13 NOTE — Telephone Encounter (Signed)
Medication filled to pharmacy as requested.   

## 2015-08-17 ENCOUNTER — Other Ambulatory Visit: Payer: Self-pay | Admitting: Family

## 2015-08-17 ENCOUNTER — Encounter: Payer: Self-pay | Admitting: Family

## 2015-08-17 MED ORDER — ALPRAZOLAM 0.25 MG PO TABS: 0.2500 mg | ORAL_TABLET | Freq: Three times a day (TID) | ORAL | 0 refills | Status: DC | PRN

## 2015-08-17 MED ORDER — BUPROPION HCL 75 MG PO TABS: ORAL_TABLET | ORAL | 0 refills | Status: DC

## 2015-08-17 NOTE — Telephone Encounter (Signed)
Last alprazolam RX:  07/12/15, #30 UDS: low risk, 07/12/15 and due 01/11/16 Last OV: 07/31/15  Rx printed and forwarded to PCP for signature.

## 2015-08-21 ENCOUNTER — Other Ambulatory Visit: Payer: Self-pay | Admitting: Family

## 2015-08-27 ENCOUNTER — Encounter: Payer: Self-pay | Admitting: Family

## 2015-08-28 ENCOUNTER — Encounter: Payer: Self-pay | Admitting: Family

## 2015-08-29 NOTE — Telephone Encounter (Signed)
Melissa-- last keflex Rx on med list is dated 07/31/15. I do not see a more recent antibiotic?

## 2015-09-02 ENCOUNTER — Encounter: Payer: Self-pay | Admitting: Family

## 2015-09-03 MED ORDER — CEPHALEXIN 500 MG PO CAPS: 500.0000 mg | ORAL_CAPSULE | Freq: Four times a day (QID) | ORAL | 0 refills | Status: DC

## 2015-09-07 ENCOUNTER — Other Ambulatory Visit: Payer: Self-pay | Admitting: Family

## 2015-09-07 NOTE — Telephone Encounter (Signed)
Ok to send refill  

## 2015-09-11 NOTE — Telephone Encounter (Signed)
Looks like Nicole Quinn approved but was not sent in. Ok to fill in her absence

## 2015-09-12 NOTE — Telephone Encounter (Signed)
Rx printed, awaiting MD signature.  

## 2015-09-13 NOTE — Telephone Encounter (Signed)
Faxed hardcopy to [facility] [address] [place]

## 2015-09-17 ENCOUNTER — Encounter: Payer: Self-pay | Admitting: Family

## 2015-09-17 NOTE — Telephone Encounter (Signed)
See my chart. Could you please contact patient and arrange a follow up visit with me?

## 2015-09-18 ENCOUNTER — Ambulatory Visit: Payer: PRIVATE HEALTH INSURANCE | Admitting: Family

## 2015-09-18 NOTE — Telephone Encounter (Signed)
My chart message sent to pt.

## 2015-09-25 ENCOUNTER — Ambulatory Visit (INDEPENDENT_AMBULATORY_CARE_PROVIDER_SITE_OTHER): Payer: PRIVATE HEALTH INSURANCE | Admitting: Family

## 2015-09-25 ENCOUNTER — Encounter: Payer: Self-pay | Admitting: Family

## 2015-09-25 DIAGNOSIS — Z23 Encounter for immunization: Secondary | ICD-10-CM | POA: Diagnosis not present

## 2015-09-25 DIAGNOSIS — F418 Other specified anxiety disorders: Secondary | ICD-10-CM

## 2015-09-25 MED ORDER — VORTIOXETINE HBR 5 MG PO TABS: 1.0000 | ORAL_TABLET | Freq: Every day | ORAL | 1 refills | Status: DC

## 2015-09-25 NOTE — Progress Notes (Signed)
Pre visit review using our clinic review tool, if applicable. No additional management support is needed unless otherwise documented below in the visit note. 

## 2015-09-25 NOTE — Patient Instructions (Addendum)
Please remain off of Wellbutrin. Begin Trintellix once daily for depression. Go to the ER if you develop thoughts of hurting yourself or others.   Please schedule an appointment with psychiatry for ongoing care for your depression.   Psychiatric Services:  [facility], [place] [address] [place], [address], [place], [place] [facility] [phone] [place], [place], [facility], [address] [place], [place]

## 2015-09-25 NOTE — Progress Notes (Signed)
Subjective:    Patient ID: Nicole Quinn, female    DOB: 01-Dec-1975, 40 y.o.   MRN: [id number]  HPI  Nicole Quinn is a 40 yr old female who presents today for follow up.  Of her depression. In the end of June she noted worsening depression symptoms and she was started on wellbutrin.  She reports that her depression remains poorly controlled. She reports side effects of her medication including feeling nauseated, dizzy. Reports that she is tearful at times.  Has been out of wellbutrin x 1 month. Does not feel that her mood has worsened since she came off of the wellbutrin.  Denies SI.   Wt Readings from Last 3 Encounters:  09/25/15 196 lb (88.9 kg)  07/31/15 188 lb 3.2 oz (85.4 kg)  07/12/15 185 lb (83.9 kg)     Review of Systems See HPI  Past Medical History:  Diagnosis Date  . Allergy   . Anemia   . Endometriosis   . History of UTI   . Hypertension      Social History   Social History  . Marital status: Single    Spouse name: N/A  . Number of children: 2  . Years of education: N/A   Occupational History  .  [place] History Main Topics  . Smoking status: Never Smoker  . Smokeless tobacco: Never Used  . Alcohol use No  . Drug use: Unknown  . Sexual activity: Not on file   Other Topics Concern  . Not on file   Social History Narrative   Regular exercise: yes.   Lives with one child.  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            Past Surgical History:  Procedure Laterality Date  . ABDOMINAL HYSTERECTOMY  2003   partial  . CARPAL TUNNEL RELEASE     right  . GANGLION CYST EXCISION     right  . Hammer Toe Repair Bilateral 12/29/2013   Lt #3, #4, Rt #2.    Family History  Problem Relation Age of Onset  . Arthritis Mother   . Hyperlipidemia Mother   . Hypertension Mother   . Hypertension Father   . Diabetes Father   . Allergies Daughter   . Allergies Son   . Arthritis  Maternal Grandmother   . Stroke Maternal Grandmother 50  . Hypertension Maternal Grandmother   . Leukemia Paternal Grandmother   . Coronary artery disease Maternal Aunt 50  . Coronary artery disease Maternal Aunt 50    Allergies  Allergen Reactions  . Amoxicillin     rash  . Ace Inhibitors     REACTION: lip swelling    Current Outpatient Prescriptions on File Prior to Visit  Medication Sig Dispense Refill  . ALPRAZolam (XANAX) 0.25 MG tablet TAKE ONE TABLET BY MOUTH THREE TIMES DAILY AS NEEDED FOR ANXIETY 30 tablet 0  . buPROPion (WELLBUTRIN) 75 MG tablet Begin 1 tab once daily for 3 days, then increase to 1 tab twice daily. 60 tablet 0  . CLARAVIS 40 MG capsule Take 2 tablets by mouth daily.    . furosemide (LASIX) 20 MG tablet Take 1 tablet (20 mg total) by mouth daily as needed. 30 tablet 5  . hydrochlorothiazide (HYDRODIURIL) 25 MG tablet Take 1 tablet (25 mg total) by mouth daily. 90 tablet 1  . metoprolol succinate (TOPROL-XL) 25 MG 24 hr tablet Take  3 tablets (75 mg total) by mouth daily. 270 tablet 1  . Multiple Vitamin (MULTIVITAMIN) tablet Take 1 tablet by mouth daily.      . potassium chloride SA (K-DUR,KLOR-CON) 20 MEQ tablet 2 tabs today (6/22) followed by 2 tabs in 4 hours, then continue one tab by mouth once daily 33 tablet 0  . Scar Treatment Products (RECEDO) GEL Apply once daily    . TRULANCE 3 MG TABS Take 1 tablet by mouth daily.     No current facility-administered medications on file prior to visit.     BP 122/80 (BP Location: Right Arm, Patient Position: Sitting, Cuff Size: Normal)   Pulse 81   Temp 98.7 F (37.1 C) (Oral)   Resp 16   Ht 5\' 5"  (1.651 m)   Wt 196 lb (88.9 kg)   SpO2 100%   BMI 32.62 kg/m       Objective:   Physical Exam  Constitutional: She is oriented to person, place, and time. She appears well-developed and well-nourished.  Cardiovascular:  No murmur heard. Pulmonary/Chest: No respiratory distress. She has no wheezes.    Neurological: She is alert and oriented to person, place, and time.  Psychiatric: Her behavior is normal. Judgment and thought content normal.  Flat affect          Assessment & Plan:

## 2015-09-25 NOTE — Assessment & Plan Note (Signed)
Remains uncontrolled. Pt scored 17 on the PHQ-9.  She did scored beneath the threshold for mood disorder on the mood disorder questionnaire. Will have patient remain off of wellbutrin. Refer to psychiatry for ongoing care. In the meantime, will initiate Trintellix.  15 min spent with pt today. >50% of this time was spent counseling patient on her depression and treatment.

## 2015-09-25 NOTE — Assessment & Plan Note (Signed)
>>  ASSESSMENT AND PLAN FOR ANXIETY ASSOCIATED WITH DEPRESSION WRITTEN ON 09/25/2015  3:00 PM BY O'SULLIVAN, Arnel Wymer, NP  Remains uncontrolled. Pt scored 17 on the PHQ-9.  She did scored beneath the threshold for mood disorder on the mood disorder questionnaire. Will have patient remain off of wellbutrin. Refer to psychiatry for ongoing care. In the meantime, will initiate Trintellix.  15 min spent with pt today. >50% of this time was spent counseling patient on her depression and treatment.

## 2015-10-10 ENCOUNTER — Other Ambulatory Visit: Payer: Self-pay | Admitting: Family Medicine

## 2015-10-10 ENCOUNTER — Telehealth: Payer: Self-pay

## 2015-10-10 NOTE — Telephone Encounter (Signed)
PA initiated for Trintellix by telephone.  Patient has tried and failed a SSRI (Per chart she has been on Zoloft, Celexa,Prozac and Effexor) and the PA has been approved from 09/10/15 until 10/09/16 PA ID # [id number].  [facility] has been made aware.    KP

## 2015-10-10 NOTE — Telephone Encounter (Signed)
Nicole Quinn patient

## 2015-10-12 MED ORDER — ALPRAZOLAM 0.25 MG PO TABS: 0.2500 mg | ORAL_TABLET | Freq: Three times a day (TID) | ORAL | 0 refills | Status: DC | PRN

## 2015-10-12 NOTE — Telephone Encounter (Signed)
Last alprazolam rf:  09/12/15, #30 UDS: 07/12/15, low risk Last OV: 09/25/15  Rx printed and forwarded to PCP for signature.

## 2015-10-12 NOTE — Telephone Encounter (Signed)
Rx faxed to pharmacy  

## 2015-11-13 ENCOUNTER — Encounter: Payer: Self-pay | Admitting: Family

## 2015-11-16 ENCOUNTER — Ambulatory Visit (INDEPENDENT_AMBULATORY_CARE_PROVIDER_SITE_OTHER): Payer: PRIVATE HEALTH INSURANCE | Admitting: Family

## 2015-11-16 ENCOUNTER — Encounter: Payer: Self-pay | Admitting: Family

## 2015-11-16 VITALS — BP 130/90 | HR 63 | Temp 98.2°F | Resp 16 | Ht 65.0 in | Wt 194.8 lb

## 2015-11-16 DIAGNOSIS — M25532 Pain in left wrist: Secondary | ICD-10-CM

## 2015-11-16 DIAGNOSIS — I10 Essential (primary) hypertension: Secondary | ICD-10-CM | POA: Diagnosis not present

## 2015-11-16 MED ORDER — METOPROLOL SUCCINATE ER 25 MG PO TB24: 75.0000 mg | ORAL_TABLET | Freq: Every day | ORAL | 1 refills | Status: DC

## 2015-11-16 MED ORDER — HYDROCHLOROTHIAZIDE 25 MG PO TABS: 25.0000 mg | ORAL_TABLET | Freq: Every day | ORAL | 1 refills | Status: DC

## 2015-11-16 MED ORDER — VORTIOXETINE HBR 5 MG PO TABS: 1.0000 | ORAL_TABLET | Freq: Every day | ORAL | 5 refills | Status: DC

## 2015-11-16 MED ORDER — FUROSEMIDE 20 MG PO TABS: 20.0000 mg | ORAL_TABLET | Freq: Every day | ORAL | 1 refills | Status: DC | PRN

## 2015-11-16 MED ORDER — POTASSIUM CHLORIDE CRYS ER 20 MEQ PO TBCR: 20.0000 meq | EXTENDED_RELEASE_TABLET | Freq: Every day | ORAL | 1 refills | Status: DC

## 2015-11-16 NOTE — Progress Notes (Signed)
Subjective:    Patient ID: Nicole Quinn, female    DOB: October 31, 1975, 40 y.o.   MRN: [id number]  HPI   [name] is a 40 yr old female who presents today with chief complaint of swelling. She continues hctz but most recently has been using her PRN lasix on a daily basis. Reports swelling is actually OK today. Generally seems to be worst at the end of the day. Sometimes her rings get tight. She does have some compression stockings but has not worn them recently.    Wt Readings from Last 3 Encounters:  11/16/15 194 lb 12.8 oz (88.4 kg)  09/25/15 196 lb (88.9 kg)  07/31/15 188 lb 3.2 oz (85.4 kg)     BP Readings from Last 3 Encounters:  11/16/15 (!) 132/100  09/25/15 122/80  07/31/15 112/70   Left wrist pain- several months.   Review of Systems See HPI  Past Medical History:  Diagnosis Date  . Allergy   . Anemia   . Endometriosis   . History of UTI   . Hypertension      Social History   Social History  . Marital status: Single    Spouse name: N/A  . Number of children: 2  . Years of education: N/A   Occupational History  .  [employer] History Main Topics  . Smoking status: Never Smoker  . Smokeless tobacco: Never Used  . Alcohol use No  . Drug use: Unknown  . Sexual activity: Not on file   Other Topics Concern  . Not on file   Social History Narrative   Regular exercise: yes.   Lives with one child.  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            Past Surgical History:  Procedure Laterality Date  . ABDOMINAL HYSTERECTOMY  2003   partial  . CARPAL TUNNEL RELEASE     right  . GANGLION CYST EXCISION     right  . Hammer Toe Repair Bilateral 12/29/2013   Lt #3, #4, Rt #2.    Family History  Problem Relation Age of Onset  . Arthritis Mother   . Hyperlipidemia Mother   . Hypertension Mother   . Hypertension Father   . Diabetes Father   . Allergies Daughter   .  Allergies Son   . Arthritis Maternal Grandmother   . Stroke Maternal Grandmother 50  . Hypertension Maternal Grandmother   . Leukemia Paternal Grandmother   . Coronary artery disease Maternal Aunt 50  . Coronary artery disease Maternal Aunt 50    Allergies  Allergen Reactions  . Amoxicillin     rash  . Ace Inhibitors     REACTION: lip swelling    Current Outpatient Prescriptions on File Prior to Visit  Medication Sig Dispense Refill  . ALPRAZolam (XANAX) 0.25 MG tablet Take 1 tablet (0.25 mg total) by mouth 3 (three) times daily as needed. for anxiety 30 tablet 0  . CLARAVIS 40 MG capsule Take 2 tablets by mouth daily.    . Multiple Vitamin (MULTIVITAMIN) tablet Take 1 tablet by mouth daily.      . Scar Treatment Products (RECEDO) GEL Apply once daily    . TRULANCE 3 MG TABS Take 1 tablet by mouth daily.     No current facility-administered medications on file prior to visit.     BP (!) 132/100 (BP Location: Right Arm, Cuff  Size: Normal)   Pulse 63   Temp 98.2 F (36.8 C) (Oral)   Resp 16   Ht 5\' 5"  (1.651 m)   Wt 194 lb 12.8 oz (88.4 kg)   SpO2 98% Comment: room air  BMI 32.42 kg/m       Objective:   Physical Exam  Constitutional: She appears well-developed and well-nourished.  Cardiovascular: Normal rate, regular rhythm and normal heart sounds.   No murmur heard. No hand of leg swelling noted today  Pulmonary/Chest: Effort normal and breath sounds normal. No respiratory distress. She has no wheezes.  Psychiatric: She has a normal mood and affect. Her behavior is normal. Judgment and thought content normal.          Assessment & Plan:  Edema-  Stable currently. She had a normal 2D echo on 03/29/15.  We discussed use of compression stockings, limiting sodium, good hydration. Reassurance provided.  Will obtain bmet to assess K+ given recent use of lasix.   HTN-  Repeat BP is acceptable. Continue current meds.

## 2015-11-16 NOTE — Progress Notes (Signed)
Pre visit review using our clinic review tool, if applicable. No additional management support is needed unless otherwise documented below in the visit note. 

## 2015-11-16 NOTE — Patient Instructions (Signed)
Please complete lab work prior to leaving.   

## 2015-11-21 ENCOUNTER — Telehealth: Payer: Self-pay | Admitting: Family

## 2015-11-21 ENCOUNTER — Other Ambulatory Visit: Payer: Self-pay | Admitting: Family

## 2015-11-21 ENCOUNTER — Other Ambulatory Visit: Payer: PRIVATE HEALTH INSURANCE

## 2015-11-21 DIAGNOSIS — E876 Hypokalemia: Secondary | ICD-10-CM

## 2015-11-21 LAB — BASIC METABOLIC PANEL
BUN: 15 mg/dL (ref 6–23)
CO2: 30 mEq/L (ref 19–32)
Calcium: 9.4 mg/dL (ref 8.4–10.5)
Chloride: 102 mEq/L (ref 96–112)
Creatinine, Ser: 0.84 mg/dL (ref 0.40–1.20)
GFR: 97.22 mL/min (ref >60.00)
Glucose, Bld: 83 mg/dL (ref 70–99)
Potassium: 3.1 mEq/L — ABNORMAL LOW (ref 3.5–5.1)
Sodium: 140 mEq/L (ref 135–145)

## 2015-11-21 NOTE — Telephone Encounter (Signed)
Patient called regarding lab results. Please advise.   Phone: [phone]

## 2015-11-21 NOTE — Telephone Encounter (Signed)
Notified pt and she states she has not been taking potassium every day. Will take it every day and repeat bmet on 11/28/15 at 9:15am. Future order entered.

## 2015-11-21 NOTE — Telephone Encounter (Signed)
Attempted to call Patient at 1645 on 11/21/2015. Advised Patient to give the Office a call to discuss her lab results (Potassium).

## 2015-11-21 NOTE — Telephone Encounter (Signed)
K+ is low. Confirm that she has been taking. And if so, I would like her to increase to 2 tabs once daily on the days she takes lasix.  If no lasix, just one tab once daily. Repeat bmet in 1 week.

## 2015-11-23 NOTE — Telephone Encounter (Signed)
See 11/21/15 lab note.

## 2015-11-28 ENCOUNTER — Telehealth: Payer: Self-pay | Admitting: Family

## 2015-11-28 ENCOUNTER — Ambulatory Visit (INDEPENDENT_AMBULATORY_CARE_PROVIDER_SITE_OTHER): Payer: PRIVATE HEALTH INSURANCE | Admitting: Family Medicine

## 2015-11-28 ENCOUNTER — Other Ambulatory Visit (INDEPENDENT_AMBULATORY_CARE_PROVIDER_SITE_OTHER): Payer: PRIVATE HEALTH INSURANCE

## 2015-11-28 DIAGNOSIS — M25532 Pain in left wrist: Secondary | ICD-10-CM

## 2015-11-28 DIAGNOSIS — E876 Hypokalemia: Secondary | ICD-10-CM

## 2015-11-28 LAB — BASIC METABOLIC PANEL
BUN: 11 mg/dL (ref 6–23)
CO2: 30 mEq/L (ref 19–32)
Calcium: 9.4 mg/dL (ref 8.4–10.5)
Chloride: 102 mEq/L (ref 96–112)
Creatinine, Ser: 0.77 mg/dL (ref 0.40–1.20)
GFR: 107.48 mL/min (ref >60.00)
Glucose, Bld: 91 mg/dL (ref 70–99)
Potassium: 3.3 mEq/L — ABNORMAL LOW (ref 3.5–5.1)
Sodium: 140 mEq/L (ref 135–145)

## 2015-11-28 MED ORDER — POTASSIUM CHLORIDE CRYS ER 20 MEQ PO TBCR: 20.0000 meq | EXTENDED_RELEASE_TABLET | Freq: Two times a day (BID) | ORAL | 1 refills | Status: DC

## 2015-11-28 NOTE — Telephone Encounter (Signed)
K+ is still low. Is she taking bid kdur? If so, stop lasix and repeat bmet in 1 week.  If not taking kdur bid, increase to bid and repeat in 1 week.

## 2015-11-28 NOTE — Progress Notes (Deleted)
K+ is still low.  Is she taking kdur bid?  If not, please increase to bid and repeat bmet in 1week.  If she is taking bid, then I would like her to stop the lasix and  Repeat bmet in 1 week.

## 2015-11-28 NOTE — Patient Instructions (Signed)
You have tendinitis/strain of the extensor and flexor carpi ulnaris tendons. Wear the wrist brace as often as possible over next 6 weeks. Icing 15 minutes at a time 3-4 times a day. Aleve 2 tabs twice a day with food OR ibuprofen 600mg  three times a day with food for pain and inflammation. Occupational therapy, nitro patches, injection are all considerations if not improving as expected. Follow up with me in 5-6 weeks.

## 2015-11-29 ENCOUNTER — Encounter: Payer: Self-pay | Admitting: Family Medicine

## 2015-11-29 DIAGNOSIS — M25532 Pain in left wrist: Secondary | ICD-10-CM | POA: Insufficient documentation

## 2015-11-29 NOTE — Assessment & Plan Note (Signed)
2/2 tendinitis/strain of extensor and flexor carpi ulnaris tendons.  Wrist brace, icing, aleve or ibuprofen.  Consider occupational therapy, nitro patches, injection if not improving.  F/u in 5-6 weeks.

## 2015-11-29 NOTE — Progress Notes (Signed)
PCP and consultation requested by: [name]., NP  Subjective:   HPI: Patient is a 40 y.o. female here for left wrist pain.  Patient reports she's had about 2 months of left wrist pain. Worsening over this time - comes and goes. Pain level 5-6/10, sharp ulnar side of wrist. Worse with computer work. Occasionally taking ibuprofen. Worse when picking up heavy items. No skin changes, numbness. Right handed.  Past Medical History:  Diagnosis Date  . Allergy   . Anemia   . Endometriosis   . History of UTI   . Hypertension     Current Outpatient Prescriptions on File Prior to Visit  Medication Sig Dispense Refill  . ALPRAZolam (XANAX) 0.25 MG tablet Take 1 tablet (0.25 mg total) by mouth 3 (three) times daily as needed. for anxiety 30 tablet 0  . CLARAVIS 40 MG capsule Take 2 tablets by mouth daily.    . furosemide (LASIX) 20 MG tablet Take 1 tablet (20 mg total) by mouth daily as needed. 90 tablet 1  . hydrochlorothiazide (HYDRODIURIL) 25 MG tablet Take 1 tablet (25 mg total) by mouth daily. 90 tablet 1  . metoprolol succinate (TOPROL-XL) 25 MG 24 hr tablet Take 3 tablets (75 mg total) by mouth daily. 270 tablet 1  . Multiple Vitamin (MULTIVITAMIN) tablet Take 1 tablet by mouth daily.      . Scar Treatment Products (RECEDO) GEL Apply once daily    . TRULANCE 3 MG TABS Take 1 tablet by mouth daily.    Marland Kitchen vortioxetine HBr (TRINTELLIX) 5 MG TABS Take 1 tablet (5 mg total) by mouth daily. 30 tablet 5   No current facility-administered medications on file prior to visit.     Past Surgical History:  Procedure Laterality Date  . ABDOMINAL HYSTERECTOMY  2003   partial  . CARPAL TUNNEL RELEASE     right  . GANGLION CYST EXCISION     right  . Hammer Toe Repair Bilateral 12/29/2013   Lt #3, #4, Rt #2.    Allergies  Allergen Reactions  . Amoxicillin     rash  . Ace Inhibitors     REACTION: lip swelling    Social History   Social History  . Marital status:  Single    Spouse name: N/A  . Number of children: 2  . Years of education: N/A   Occupational History  .  [place] History Main Topics  . Smoking status: Never Smoker  . Smokeless tobacco: Never Used  . Alcohol use No  . Drug use: Unknown  . Sexual activity: Not on file   Other Topics Concern  . Not on file   Social History Narrative   Regular exercise: yes.   Lives with one child.  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            Family History  Problem Relation Age of Onset  . Arthritis Mother   . Hyperlipidemia Mother   . Hypertension Mother   . Hypertension Father   . Diabetes Father   . Allergies Daughter   . Allergies Son   . Arthritis Maternal Grandmother   . Stroke Maternal Grandmother 50  . Hypertension Maternal Grandmother   . Leukemia Paternal Grandmother   . Coronary artery disease Maternal Aunt 50  . Coronary artery disease Maternal Aunt 50    BP 133/79   Pulse 68   Ht 5\' 4"  (1.626  m)   Wt 185 lb (83.9 kg)   BMI 31.76 kg/m   Review of Systems: See HPI above.     Objective:  Physical Exam:  Gen: NAD, comfortable in exam room  Left wrist: No gross deformity, swelling, bruising. TTP over extensor and flexor carpi ulnaris tendons.  No bony tenderness. FROM wrist and elbow.  Pain on resisted wrist flexion, extension, ulnar deviation. Strength 5/5 all motions. Negative tinels at carpal tunnel. NVI distally.  Right wrist: FROM without pain. Assessment & Plan:  1. Left wrist pain - 2/2 tendinitis/strain of extensor and flexor carpi ulnaris tendons.  Wrist brace, icing, aleve or ibuprofen.  Consider occupational therapy, nitro patches, injection if not improving.  F/u in 5-6 weeks.

## 2015-11-30 NOTE — Telephone Encounter (Signed)
I spoke with the patient and advised her of the note below and she did not have any further questions. Pt has a follow up lab appointment. Pt states that is having some swelling and she was offered an appointment and she denied at this time since she was out of town. She was asked to follow up with someone over the weekend if the swelling became worse. She stated she would call our office next week if the swelling was not better at that time.   Future lab order placed and lab appointment scheduled.

## 2015-12-07 ENCOUNTER — Encounter: Payer: Self-pay | Admitting: Family

## 2015-12-07 ENCOUNTER — Other Ambulatory Visit (INDEPENDENT_AMBULATORY_CARE_PROVIDER_SITE_OTHER): Payer: PRIVATE HEALTH INSURANCE

## 2015-12-07 ENCOUNTER — Telehealth: Payer: Self-pay

## 2015-12-07 DIAGNOSIS — E876 Hypokalemia: Secondary | ICD-10-CM | POA: Diagnosis not present

## 2015-12-07 LAB — BASIC METABOLIC PANEL
BUN: 11 mg/dL (ref 6–23)
CO2: 26 mEq/L (ref 19–32)
Calcium: 8.8 mg/dL (ref 8.4–10.5)
Chloride: 106 mEq/L (ref 96–112)
Creatinine, Ser: 0.76 mg/dL (ref 0.40–1.20)
GFR: 109.1 mL/min (ref >60.00)
Glucose, Bld: 76 mg/dL (ref 70–99)
Potassium: 3.7 mEq/L (ref 3.5–5.1)
Sodium: 140 mEq/L (ref 135–145)

## 2015-12-07 NOTE — Telephone Encounter (Signed)
Error

## 2015-12-07 NOTE — Telephone Encounter (Signed)
Patient sent a mychart message stating she was having difficulty breathing I tried to call the patient several times at [phone] there was no answer. I sent a mychart message for patient to go to the nearest emergency department. PC

## 2015-12-12 ENCOUNTER — Other Ambulatory Visit: Payer: Self-pay | Admitting: Family

## 2015-12-12 MED ORDER — ALPRAZOLAM 0.25 MG PO TABS: 0.2500 mg | ORAL_TABLET | Freq: Three times a day (TID) | ORAL | 0 refills | Status: DC | PRN

## 2015-12-12 NOTE — Telephone Encounter (Signed)
Patient then sent me a mychart message back stating that she did not need to go to ER and that she would be finding another doctor.  PC

## 2015-12-12 NOTE — Telephone Encounter (Signed)
Last Alprazolam Rf: 10/12/15, #30 Last OV: 11/16/15 UDS:  Low risk, 07/12/15  Rx printed and forwarded to covering Provider for signature.

## 2015-12-14 ENCOUNTER — Encounter: Payer: Self-pay | Admitting: Family

## 2015-12-17 ENCOUNTER — Other Ambulatory Visit: Payer: Self-pay | Admitting: Family

## 2015-12-26 ENCOUNTER — Encounter: Payer: Self-pay | Admitting: Family

## 2015-12-26 ENCOUNTER — Other Ambulatory Visit: Payer: Self-pay | Admitting: Family

## 2015-12-26 MED ORDER — LORCASERIN HCL 10 MG PO TABS: 1.0000 | ORAL_TABLET | Freq: Two times a day (BID) | ORAL | 0 refills | Status: DC

## 2015-12-26 NOTE — Telephone Encounter (Signed)
Rx placed at front desk for pick up with note to Provide UDS when she picks up Rx.

## 2015-12-27 ENCOUNTER — Encounter: Payer: Self-pay | Admitting: Family

## 2015-12-28 NOTE — Telephone Encounter (Signed)
Received fax from [facility] that belviq is not covered under pt's insurance. Per Herbert Spires at [employer] [phone]), she is not able to find any covered alternatives. Sent email to pt to let us know the names of medication that her plan may cover if she finds out anything different.

## 2015-12-31 NOTE — Telephone Encounter (Signed)
Nicole Quinn, please see pt's medication suggestions and advise?

## 2016-01-09 ENCOUNTER — Ambulatory Visit: Payer: PRIVATE HEALTH INSURANCE | Admitting: Family Medicine

## 2016-01-10 ENCOUNTER — Other Ambulatory Visit: Payer: Self-pay | Admitting: Family Medicine

## 2016-01-11 MED ORDER — ALPRAZOLAM 0.25 MG PO TABS: 0.2500 mg | ORAL_TABLET | Freq: Three times a day (TID) | ORAL | 0 refills | Status: DC | PRN

## 2016-01-11 NOTE — Telephone Encounter (Signed)
Rx faxed to pharmacy  

## 2016-01-11 NOTE — Telephone Encounter (Signed)
See Rx 

## 2016-01-11 NOTE — Telephone Encounter (Signed)
Refill request for Alprazolam 0.25mg  Last filled by MD on - 12/12/15, #30x0 Last AEX - 11/16/15 Next AEX - 3-Mths Please Advise on refills/SLS 12/22

## 2016-01-17 ENCOUNTER — Telehealth: Payer: Self-pay

## 2016-01-17 NOTE — Telephone Encounter (Signed)
PA initiated via Covermymeds; KEY: [id number]. Received real-time determination. PA can not be processed; insurance plan either does not cover weight loss management/medication or Belviq is not on formulary per Vinnie [facility] at [employer]. Forwarding to covering provider in PCP absence.

## 2016-01-17 NOTE — Telephone Encounter (Signed)
It appears that reg PCP is aware and is going to have pt follow up to discuss possible use of Saxenda. Please make sure this is scheduled. TY.

## 2016-01-17 NOTE — Telephone Encounter (Signed)
CPE scheduled 01/28/2016 w/ PCP.

## 2016-01-28 ENCOUNTER — Other Ambulatory Visit (HOSPITAL_COMMUNITY)
Admission: RE | Admit: 2016-01-28 | Discharge: 2016-01-28 | Disposition: A | Discharge status: Home / Self Care | Payer: PRIVATE HEALTH INSURANCE | Source: Ambulatory Visit | Attending: Family | Admitting: Family

## 2016-01-28 ENCOUNTER — Ambulatory Visit (INDEPENDENT_AMBULATORY_CARE_PROVIDER_SITE_OTHER): Payer: PRIVATE HEALTH INSURANCE | Admitting: Family

## 2016-01-28 ENCOUNTER — Encounter: Payer: Self-pay | Admitting: Family

## 2016-01-28 VITALS — BP 120/90 | HR 66 | Temp 98.3°F | Resp 16 | Ht 65.0 in | Wt 204.0 lb

## 2016-01-28 DIAGNOSIS — F32A Depression, unspecified: Secondary | ICD-10-CM

## 2016-01-28 DIAGNOSIS — N898 Other specified noninflammatory disorders of vagina: Secondary | ICD-10-CM | POA: Diagnosis not present

## 2016-01-28 DIAGNOSIS — F329 Major depressive disorder, single episode, unspecified: Secondary | ICD-10-CM

## 2016-01-28 DIAGNOSIS — Z0000 Encounter for general adult medical examination without abnormal findings: Secondary | ICD-10-CM

## 2016-01-28 DIAGNOSIS — Z0001 Encounter for general adult medical examination with abnormal findings: Secondary | ICD-10-CM | POA: Diagnosis not present

## 2016-01-28 MED ORDER — VENLAFAXINE HCL ER 37.5 MG PO CP24: ORAL_CAPSULE | ORAL | 1 refills | Status: DC

## 2016-01-28 NOTE — Progress Notes (Signed)
Pre visit review using our clinic review tool, if applicable. No additional management support is needed unless otherwise documented below in the visit note. 

## 2016-01-28 NOTE — Progress Notes (Signed)
Subjective:    Patient ID: Nicole Quinn, female    DOB: 23-Apr-1975, 41 y.o.   MRN: [id number]  HPI  Patient presents today for complete physical.  Immunizations: up to date.  Diet: reports healthy diet, watches her sodium.  trys to keep calories 1200-1400.   Exercise: cardio,  Pap Smear: hysterectomy  Obesity-  Reports that belviq was non-formulary. She is interested in trying saxenda for weight loss.   Wt Readings from Last 3 Encounters:  01/28/16 204 lb (92.5 kg)  11/29/15 185 lb (83.9 kg)  11/16/15 194 lb 12.8 oz (88.4 kg)   Using QOD lasix for edema.   Depression- Trintellix was too expensive. Reports ongoing depression symptosm.   Vaginal discharge-see ROS  Review of Systems  Constitutional: Negative for unexpected weight change.  HENT: Negative for hearing loss and rhinorrhea.   Eyes: Negative for visual disturbance.  Respiratory: Negative for cough.        Reports + mild SOB sometimes with walking  Cardiovascular: Positive for leg swelling. Negative for chest pain.  Gastrointestinal:       Chronic constipation  Genitourinary: Negative for dysuria and frequency.       + vaginal discharge, occasional odor, white d/c no itching.   Musculoskeletal: Negative for arthralgias and myalgias.  Neurological: Negative for headaches.  Hematological: Negative for adenopathy.   Past Medical History:  Diagnosis Date  . Allergy   . Anemia   . Endometriosis   . History of UTI   . Hypertension      Social History   Social History  . Marital status: Single    Spouse name: N/A  . Number of children: 2  . Years of education: N/A   Occupational History  .  [place] History Main Topics  . Smoking status: Never Smoker  . Smokeless tobacco: Never Used  . Alcohol use No  . Drug use: Unknown  . Sexual activity: Not on file   Other Topics Concern  . Not on file   Social History Narrative   Regular exercise: yes.   Lives with one child.  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            Past Surgical History:  Procedure Laterality Date  . ABDOMINAL HYSTERECTOMY  2003   partial  . CARPAL TUNNEL RELEASE     right  . GANGLION CYST EXCISION     right  . Hammer Toe Repair Bilateral 12/29/2013   Lt #3, #4, Rt #2.    Family History  Problem Relation Age of Onset  . Allergies Daughter   . Allergies Son   . Leukemia Paternal Grandmother   . Arthritis Mother   . Hyperlipidemia Mother   . Hypertension Mother   . Hypertension Father   . Diabetes Father   . Arthritis Maternal Grandmother   . Stroke Maternal Grandmother 50  . Hypertension Maternal Grandmother   . Coronary artery disease Maternal Aunt 50  . Coronary artery disease Maternal Aunt 50    Allergies  Allergen Reactions  . Amoxicillin     rash  . Ace Inhibitors     REACTION: lip swelling    Current Outpatient Prescriptions on File Prior to Visit  Medication Sig Dispense Refill  . adapalene (DIFFERIN) 0.1 % cream     . ALPRAZolam (XANAX) 0.25 MG tablet Take 1 tablet (0.25 mg total) by mouth 3 (three) times daily as needed.  for anxiety 30 tablet 0  . furosemide (LASIX) 20 MG tablet Take 1 tablet (20 mg total) by mouth daily as needed. 90 tablet 1  . hydrochlorothiazide (HYDRODIURIL) 25 MG tablet Take 1 tablet (25 mg total) by mouth daily. 90 tablet 1  . hydroquinone 4 % cream     . metoprolol succinate (TOPROL-XL) 25 MG 24 hr tablet Take 3 tablets (75 mg total) by mouth daily. 270 tablet 1  . Multiple Vitamin (MULTIVITAMIN) tablet Take 1 tablet by mouth daily.      . potassium chloride SA (K-DUR,KLOR-CON) 20 MEQ tablet Take 1 tablet (20 mEq total) by mouth 2 (two) times daily. 90 tablet 1  . Scar Treatment Products (RECEDO) GEL Apply once daily    . TRULANCE 3 MG TABS Take 1 tablet by mouth daily.     No current facility-administered medications on file prior to visit.     BP (!) 137/92 (BP  Location: Right Arm, Cuff Size: Large)   Pulse 66   Temp 98.3 F (36.8 C) (Oral)   Resp 16   Ht 5\' 5"  (1.651 m)   Wt 204 lb (92.5 kg)   SpO2 100% Comment: room air  BMI 33.95 kg/m       Objective:   Physical Exam Physical Exam  Constitutional: She is oriented to person, place, and time. She appears well-developed and well-nourished. No distress.  HENT:  Head: Normocephalic and atraumatic.  Right Ear: Tympanic membrane and ear canal normal.  Left Ear: Tympanic membrane and ear canal normal.  Mouth/Throat: Oropharynx is clear and moist.  Eyes: Pupils are equal, round, and reactive to light. No scleral icterus.  Neck: Normal range of motion. No thyromegaly present.  Cardiovascular: Normal rate and regular rhythm.   No murmur heard. Pulmonary/Chest: Effort normal and breath sounds normal. No respiratory distress. He has no wheezes. She has no rales. She exhibits no tenderness.  Abdominal: Soft. Bowel sounds are normal. She exhibits no distension and no mass. There is no tenderness. There is no rebound and no guarding.  Musculoskeletal: She exhibits no edema.  Lymphadenopathy:    She has no cervical adenopathy.  Neurological: She is alert and oriented to person, place, and time. She has normal patellar reflexes. She exhibits normal muscle tone. Coordination normal.  Skin: Skin is warm and dry.  Psychiatric: She has a normal mood and affect. Her behavior is normal. Judgment and thought content normal.  Breasts: Examined lying Right: Without masses, retractions, discharge or axillary adenopathy.  Left: Without masses, retractions, discharge or axillary adenopathy.  Inguinal/mons: Normal without inguinal adenopathy  External genitalia: Normal  BUS/Urethra/Skene's glands: Normal  Bladder: Normal  Vagina: Normal, some white vaginal discharge is noted.            Assessment & Plan:    Preventative Care- discussed diet/exercise and weight loss. I have asked the patient to  check with her insurance re: coverage for weight loss meds and to let me know what medications are on her formulary. Immunizations reviewed and up to date. Obtain routine lab work.  Vaginitis- wet prep obtained. Will await results prior to choosing treatment.    Assessment & Plan:

## 2016-01-28 NOTE — Patient Instructions (Addendum)
Please check with your insurance to discuss if any weight loss medications are covered and if so- which medications. Continue to work on [employer] and exercise. Please schedule a lab visit at the front desk. Begin effexor for depression.

## 2016-01-29 ENCOUNTER — Other Ambulatory Visit: Payer: PRIVATE HEALTH INSURANCE

## 2016-01-29 ENCOUNTER — Telehealth: Payer: Self-pay | Admitting: Family

## 2016-01-29 DIAGNOSIS — F329 Major depressive disorder, single episode, unspecified: Secondary | ICD-10-CM | POA: Insufficient documentation

## 2016-01-29 DIAGNOSIS — F32A Depression, unspecified: Secondary | ICD-10-CM | POA: Insufficient documentation

## 2016-01-29 LAB — LIPID PANEL
Cholesterol: 222 mg/dL — ABNORMAL HIGH (ref 0–200)
HDL: 62.9 mg/dL (ref >39.00)
LDL Cholesterol: 149 mg/dL — ABNORMAL HIGH (ref 0–99)
NonHDL: 159.08
Total CHOL/HDL Ratio: 4
Triglycerides: 49 mg/dL (ref 0.0–149.0)
VLDL: 9.8 mg/dL (ref 0.0–40.0)

## 2016-01-29 LAB — URINALYSIS, ROUTINE W REFLEX MICROSCOPIC
Bilirubin Urine: NEGATIVE
Ketones, ur: NEGATIVE
Leukocytes, UA: NEGATIVE
Nitrite: NEGATIVE
Specific Gravity, Urine: 1.02 (ref 1.000–1.030)
Total Protein, Urine: NEGATIVE
Urine Glucose: NEGATIVE
Urobilinogen, UA: 0.2 (ref 0.0–1.0)
pH: 6 (ref 5.0–8.0)

## 2016-01-29 LAB — BASIC METABOLIC PANEL
BUN: 14 mg/dL (ref 6–23)
CO2: 31 mEq/L (ref 19–32)
Calcium: 9.8 mg/dL (ref 8.4–10.5)
Chloride: 97 mEq/L (ref 96–112)
Creatinine, Ser: 0.93 mg/dL (ref 0.40–1.20)
GFR: 86.36 mL/min (ref >60.00)
Glucose, Bld: 84 mg/dL (ref 70–99)
Potassium: 3.1 mEq/L — ABNORMAL LOW (ref 3.5–5.1)
Sodium: 138 mEq/L (ref 135–145)

## 2016-01-29 LAB — CBC WITH DIFFERENTIAL/PLATELET
Basophils Absolute: 0 10*3/uL (ref 0.0–0.1)
Basophils Relative: 0.5 % (ref 0.0–3.0)
Eosinophils Absolute: 0.1 10*3/uL (ref 0.0–0.7)
Eosinophils Relative: 1.1 % (ref 0.0–5.0)
HCT: 38.7 % (ref 36.0–46.0)
Hemoglobin: 13.2 g/dL (ref 12.0–15.0)
Lymphocytes Relative: 41.2 % (ref 12.0–46.0)
Lymphs Abs: 3.1 10*3/uL (ref 0.7–4.0)
MCHC: 34.2 g/dL (ref 30.0–36.0)
MCV: 91.3 fl (ref 78.0–100.0)
Monocytes Absolute: 0.5 10*3/uL (ref 0.1–1.0)
Monocytes Relative: 6.3 % (ref 3.0–12.0)
Neutro Abs: 3.8 10*3/uL (ref 1.4–7.7)
Neutrophils Relative %: 50.9 % (ref 43.0–77.0)
Platelets: 382 10*3/uL (ref 150.0–400.0)
RBC: 4.24 Mil/uL (ref 3.87–5.11)
RDW: 13.3 % (ref 11.5–15.5)
WBC: 7.5 10*3/uL (ref 4.0–10.5)

## 2016-01-29 LAB — HEPATIC FUNCTION PANEL
ALT: 12 U/L (ref 0–35)
AST: 16 U/L (ref 0–37)
Albumin: 4.4 g/dL (ref 3.5–5.2)
Alkaline Phosphatase: 75 U/L (ref 39–117)
Bilirubin, Direct: 0.1 mg/dL (ref 0.0–0.3)
Total Bilirubin: 0.3 mg/dL (ref 0.2–1.2)
Total Protein: 8 g/dL (ref 6.0–8.3)

## 2016-01-29 LAB — TSH: TSH: 1.94 u[IU]/mL (ref 0.35–4.50)

## 2016-01-29 MED ORDER — SPIRONOLACTONE 25 MG PO TABS: 25.0000 mg | ORAL_TABLET | Freq: Every day | ORAL | 0 refills | Status: DC

## 2016-01-29 NOTE — Telephone Encounter (Signed)
Notified pt. She states she has not missed any potassium and is agreeable to proceed with Aldactone. Rx sent. Med list updated and appt scheduled for 02/08/16 at 7am.

## 2016-01-29 NOTE — Telephone Encounter (Signed)
Please contact pt and let her know that her potassium is low.  Has she missed any potassium doses?  If she has not missed any doses, then I would recommend that she stop hctz and instead begin aldactone 25mg  once daily.  If she starts aldactone she should only take potassium 1 tablet by mouth on the days that she takes lasix.  If she changes to aldactone, lets bring her back in 1 week for office visit.    If she missed doses, restart kdur and check bmet in 1 week.

## 2016-01-29 NOTE — Assessment & Plan Note (Signed)
Uncontrolled. Scored 11 on PHQ-9.  She could not afford Trintellix.  She previously tolerated effexor. Will restart effexor.

## 2016-01-29 NOTE — Telephone Encounter (Signed)
Left message for pt to return my call.

## 2016-01-29 NOTE — Telephone Encounter (Signed)
Also, her cholesterol is mildly elevated. I recommend that she work on low fat/low cholesterol diet. Vaginal testing is pending.

## 2016-01-30 LAB — CERVICOVAGINAL ANCILLARY ONLY: Wet Prep (BD Affirm): POSITIVE — AB

## 2016-01-31 ENCOUNTER — Encounter: Payer: Self-pay | Admitting: Family

## 2016-01-31 ENCOUNTER — Other Ambulatory Visit: Payer: Self-pay | Admitting: Family

## 2016-01-31 DIAGNOSIS — E876 Hypokalemia: Secondary | ICD-10-CM

## 2016-01-31 MED ORDER — METRONIDAZOLE 500 MG PO TABS: 500.0000 mg | ORAL_TABLET | Freq: Two times a day (BID) | ORAL | 0 refills | Status: DC

## 2016-01-31 MED ORDER — POTASSIUM CHLORIDE CRYS ER 20 MEQ PO TBCR: EXTENDED_RELEASE_TABLET | ORAL | 1 refills | Status: DC

## 2016-02-01 ENCOUNTER — Other Ambulatory Visit (INDEPENDENT_AMBULATORY_CARE_PROVIDER_SITE_OTHER): Payer: PRIVATE HEALTH INSURANCE

## 2016-02-01 ENCOUNTER — Telehealth: Payer: Self-pay | Admitting: Family

## 2016-02-01 DIAGNOSIS — E876 Hypokalemia: Secondary | ICD-10-CM | POA: Diagnosis not present

## 2016-02-01 LAB — BASIC METABOLIC PANEL
BUN: 18 mg/dL (ref 6–23)
CO2: 27 mEq/L (ref 19–32)
Calcium: 8.9 mg/dL (ref 8.4–10.5)
Chloride: 108 mEq/L (ref 96–112)
Creatinine, Ser: 0.82 mg/dL (ref 0.40–1.20)
GFR: 99.86 mL/min (ref >60.00)
Glucose, Bld: 88 mg/dL (ref 70–99)
Potassium: 3.5 mEq/L (ref 3.5–5.1)
Sodium: 140 mEq/L (ref 135–145)

## 2016-02-01 NOTE — Telephone Encounter (Signed)
Opened in error

## 2016-02-02 ENCOUNTER — Encounter: Payer: Self-pay | Admitting: Family

## 2016-02-04 NOTE — Telephone Encounter (Signed)
Med list has been updated 

## 2016-02-08 ENCOUNTER — Ambulatory Visit: Payer: PRIVATE HEALTH INSURANCE | Admitting: Family

## 2016-02-12 ENCOUNTER — Other Ambulatory Visit: Payer: Self-pay | Admitting: Family

## 2016-02-12 NOTE — Telephone Encounter (Signed)
Ok to send #30.

## 2016-02-13 ENCOUNTER — Encounter: Payer: Self-pay | Admitting: Family

## 2016-02-13 ENCOUNTER — Telehealth: Payer: Self-pay | Admitting: Family

## 2016-02-13 ENCOUNTER — Ambulatory Visit (INDEPENDENT_AMBULATORY_CARE_PROVIDER_SITE_OTHER): Payer: PRIVATE HEALTH INSURANCE | Admitting: Family

## 2016-02-13 VITALS — BP 131/97 | HR 67 | Temp 98.3°F | Resp 16 | Ht 65.0 in | Wt 205.0 lb

## 2016-02-13 DIAGNOSIS — F32A Depression, unspecified: Secondary | ICD-10-CM

## 2016-02-13 DIAGNOSIS — E876 Hypokalemia: Secondary | ICD-10-CM | POA: Diagnosis not present

## 2016-02-13 DIAGNOSIS — I10 Essential (primary) hypertension: Secondary | ICD-10-CM | POA: Diagnosis not present

## 2016-02-13 DIAGNOSIS — F329 Major depressive disorder, single episode, unspecified: Secondary | ICD-10-CM | POA: Diagnosis not present

## 2016-02-13 MED ORDER — METOPROLOL SUCCINATE ER 100 MG PO TB24: 100.0000 mg | ORAL_TABLET | Freq: Every day | ORAL | 2 refills | Status: DC

## 2016-02-13 NOTE — Telephone Encounter (Signed)
Reviewed patient's record in care everywhere which indicates treatment for AML in 2014 at [facility].  I was unaware of such a diagnosis.  I contacted patient and she denies previous visits at [facility] or history of AML.

## 2016-02-13 NOTE — Progress Notes (Signed)
Pre visit review using our clinic review tool, if applicable. No additional management support is needed unless otherwise documented below in the visit note. 

## 2016-02-13 NOTE — Assessment & Plan Note (Signed)
Improving on effexor. Sleepiness should improve once she gets used to it.  Continue effexor.

## 2016-02-13 NOTE — Telephone Encounter (Signed)
Rx called to pharmacy voicemail. 

## 2016-02-13 NOTE — Patient Instructions (Signed)
Please change toprol xl from 75mg  to 100mg  once daily.

## 2016-02-13 NOTE — Progress Notes (Signed)
Subjective:    Patient ID: Nicole Quinn, female    DOB: [birth year], 41 y.o.   MRN: [id number]  HPI  Nicole Quinn is a 41 yr old female who presents today for follow up.  1) HTN- she is maintained on toprol xl 75mg  once daily as well as aldactone 25mg  once daily. She has been taking lasix nearly every day to help with her chronic LE edema.  BP Readings from Last 3 Encounters:  02/13/16 (!) 131/97  01/28/16 120/90  11/29/15 133/79   2) Depression- last visit effexor was restarted. She reports that her mood is good since she started effexor.  Notes that the medication does make her a little "sleepy."  3) Hypokalemia- she reports good compliance with kdur.    Review of Systems See HPI     Past Medical History:  Diagnosis Date  . Allergy   . Anemia   . Endometriosis   . History of UTI   . Hypertension      Social History   Social History  . Marital status: Single    Spouse name: N/A  . Number of children: 2  . Years of education: N/A   Occupational History  .  [place] History Main Topics  . Smoking status: Never Smoker  . Smokeless tobacco: Never Used  . Alcohol use No  . Drug use: Unknown  . Sexual activity: Not on file   Other Topics Concern  . Not on file   Social History Narrative   Regular exercise: yes.   Lives with one child.  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            Past Surgical History:  Procedure Laterality Date  . ABDOMINAL HYSTERECTOMY  2003   partial  . CARPAL TUNNEL RELEASE     right  . GANGLION CYST EXCISION     right  . Hammer Toe Repair Bilateral 12/29/2013   Lt #3, #4, Rt #2.    Family History  Problem Relation Age of Onset  . Allergies Daughter   . Allergies Son   . Leukemia Paternal Grandmother   . Arthritis Mother   . Hyperlipidemia Mother   . Hypertension Mother   . Hypertension Father   . Diabetes Father   . Arthritis Maternal  Grandmother   . Stroke Maternal Grandmother 50  . Hypertension Maternal Grandmother   . Coronary artery disease Maternal Aunt 50  . Coronary artery disease Maternal Aunt 50    Allergies  Allergen Reactions  . Amoxicillin     rash  . Ace Inhibitors     REACTION: lip swelling    Current Outpatient Prescriptions on File Prior to Visit  Medication Sig Dispense Refill  . adapalene (DIFFERIN) 0.1 % cream     . ALPRAZolam (XANAX) 0.25 MG tablet Take 1 tablet (0.25 mg total) by mouth 3 (three) times daily as needed. for anxiety 30 tablet 0  . furosemide (LASIX) 20 MG tablet Take 1 tablet (20 mg total) by mouth daily as needed. 90 tablet 1  . hydroquinone 4 % cream     . metoprolol succinate (TOPROL-XL) 25 MG 24 hr tablet Take 3 tablets (75 mg total) by mouth daily. 270 tablet 1  . Multiple Vitamin (MULTIVITAMIN) tablet Take 1 tablet by mouth daily.      . potassium chloride SA (K-DUR,KLOR-CON) 20 MEQ tablet Take 1 tablet twice a day. 90 tablet 1  .  Scar Treatment Products (RECEDO) GEL Apply once daily    . spironolactone (ALDACTONE) 25 MG tablet Take 1 tablet (25 mg total) by mouth daily. 30 tablet 0  . TRULANCE 3 MG TABS Take 1 tablet by mouth daily.    Marland Kitchen venlafaxine XR (EFFEXOR XR) 37.5 MG 24 hr capsule 1 tab by mouth once daily for 3 days, then increase to 2 tabs once daily 60 capsule 1   No current facility-administered medications on file prior to visit.     BP (!) 131/97 (BP Location: Right Arm, Cuff Size: Normal)   Pulse 67   Temp 98.3 F (36.8 C) (Oral)   Resp 16   Ht 5\' 5"  (1.651 m)   Wt 205 lb (93 kg)   SpO2 99% Comment: room air  BMI 34.11 kg/m    Objective:   Physical Exam  Constitutional: She appears well-developed and well-nourished.  Cardiovascular: Normal rate, regular rhythm and normal heart sounds.   No murmur heard. Pulmonary/Chest: Effort normal and breath sounds normal. No respiratory distress. She has no wheezes.  Psychiatric: She has a normal mood and  affect. Her behavior is normal. Judgment and thought content normal.          Assessment & Plan:  Hypokalemia- follow up K+ was WNL. Advised pt to continue [place].

## 2016-02-13 NOTE — Assessment & Plan Note (Signed)
DBP remains above goal. Will increase toprol xl from 75mg  to 100mg .

## 2016-02-15 ENCOUNTER — Encounter: Payer: Self-pay | Admitting: Family

## 2016-02-21 ENCOUNTER — Other Ambulatory Visit: Payer: Self-pay | Admitting: Family

## 2016-02-22 ENCOUNTER — Encounter: Payer: Self-pay | Admitting: Family

## 2016-02-25 ENCOUNTER — Ambulatory Visit: Payer: PRIVATE HEALTH INSURANCE | Admitting: Family

## 2016-02-25 MED ORDER — LORCASERIN HCL 10 MG PO TABS: 1.0000 | ORAL_TABLET | Freq: Two times a day (BID) | ORAL | 0 refills | Status: DC

## 2016-02-25 NOTE — Telephone Encounter (Signed)
Melissa-- please see above messages and advise. We already tried a PA for Belviq in 2017 and it was denied. You had previously mentioned Saxenda to the pt.  Please advise?

## 2016-02-25 NOTE — Telephone Encounter (Signed)
[facility], however I suspect it still won't be covered.  She will still need a follow up visit for BP check please.

## 2016-02-26 ENCOUNTER — Telehealth: Payer: Self-pay | Admitting: Family

## 2016-02-26 NOTE — Telephone Encounter (Signed)
Spoke with Denman George in Health information systems re:  discrepancy of medical record in the [facility] system.  She will look further into the matter and will let me know.

## 2016-03-04 ENCOUNTER — Encounter: Payer: Self-pay | Admitting: Family

## 2016-03-07 ENCOUNTER — Other Ambulatory Visit: Payer: Self-pay | Admitting: Family

## 2016-03-07 MED ORDER — VENLAFAXINE HCL ER 150 MG PO CP24
150.0000 mg | ORAL_CAPSULE | Freq: Every day | ORAL | 1 refills | Status: DC
Start: 2016-03-07 — End: 2016-03-07

## 2016-03-07 MED ORDER — VENLAFAXINE HCL ER 150 MG PO CP24: 150.0000 mg | ORAL_CAPSULE | Freq: Every day | ORAL | 0 refills | Status: DC

## 2016-03-07 NOTE — Progress Notes (Signed)
Please let pt know that I would like her to increase her effexor xr to 150mg  once daily. I would like to see her back in the office in about 3 weeks.

## 2016-03-07 NOTE — Progress Notes (Signed)
Left detailed message on pt's cell# and to call if she needs rx sent to local pharmacy and to schedule 3 week follow up.

## 2016-03-07 NOTE — Progress Notes (Signed)
Notified pt. She requests that 2 week supply be sent to [facility] on [address]; rx sent. Nurse visit for BP check scheduled for 03/11/16 at 4pm per previous office note and 3 week f/u of anxiety/depression scheduled for 03/28/16 at 7am.

## 2016-03-10 ENCOUNTER — Telehealth: Payer: Self-pay

## 2016-03-10 ENCOUNTER — Other Ambulatory Visit: Payer: Self-pay | Admitting: Family

## 2016-03-10 MED ORDER — ALPRAZOLAM 0.25 MG PO TABS: 0.2500 mg | ORAL_TABLET | Freq: Three times a day (TID) | ORAL | 0 refills | Status: DC | PRN

## 2016-03-10 MED ORDER — LORCASERIN HCL 10 MG PO TABS: 1.0000 | ORAL_TABLET | Freq: Two times a day (BID) | ORAL | 0 refills | Status: DC

## 2016-03-10 NOTE — Telephone Encounter (Signed)
Last seen

## 2016-03-10 NOTE — Telephone Encounter (Signed)
Ok

## 2016-03-10 NOTE — Telephone Encounter (Signed)
Nicole Quinn-- Pt requesting 90 day supply through mail order. Can you do 90 day rx?  Last alprazolam RX: 02/13/16, #30 Last OV:02/13/16 Next OV: 03/28/16 UDS: 01/05/16 and due 03/25/16

## 2016-03-10 NOTE — Telephone Encounter (Signed)
Rx printed and forwarded to PCP for signature. 

## 2016-03-11 ENCOUNTER — Ambulatory Visit (INDEPENDENT_AMBULATORY_CARE_PROVIDER_SITE_OTHER): Payer: PRIVATE HEALTH INSURANCE | Admitting: Family Medicine

## 2016-03-11 VITALS — BP 120/81 | HR 63

## 2016-03-11 DIAGNOSIS — I10 Essential (primary) hypertension: Secondary | ICD-10-CM | POA: Diagnosis not present

## 2016-03-11 NOTE — Telephone Encounter (Signed)
Rx faxed to pharmacy on 03/10/16 at 5:02pm.

## 2016-03-11 NOTE — Patient Instructions (Signed)
Per Dr. Charlett Blake, patient to continue medications as ordered and return for follow up visit with provider in the next 6 months.

## 2016-03-11 NOTE — Progress Notes (Signed)
RN blood pressure check note reviewed. Agree with documention and plan. 

## 2016-03-11 NOTE — Progress Notes (Signed)
Pre visit review using our clinic tool,if applicable. No additional management support is needed unless otherwise documented below in the visit note.   Patient in for BP check per order from [place], NP.  BP=120/81 P= 63  Per Dr. Charlett Blake, patient to continue medications as ordered and return for follow up visit with provider in the next 6 months.   RN blood pressure check note reviewed. Agree with documention and plan.  Penni Homans, MD

## 2016-03-12 ENCOUNTER — Other Ambulatory Visit: Payer: Self-pay | Admitting: Family

## 2016-03-12 MED ORDER — SPIRONOLACTONE 25 MG PO TABS: 25.0000 mg | ORAL_TABLET | Freq: Every day | ORAL | 1 refills | Status: DC

## 2016-03-12 NOTE — Telephone Encounter (Signed)
Rx Faxed to pharmacy on 03/10/2016

## 2016-03-21 ENCOUNTER — Encounter: Payer: Self-pay | Admitting: Family

## 2016-03-28 ENCOUNTER — Encounter: Payer: Self-pay | Admitting: Family

## 2016-03-28 ENCOUNTER — Ambulatory Visit (INDEPENDENT_AMBULATORY_CARE_PROVIDER_SITE_OTHER): Payer: PRIVATE HEALTH INSURANCE | Admitting: Family

## 2016-03-28 VITALS — BP 106/75 | HR 65 | Temp 98.2°F | Resp 16 | Ht 65.0 in | Wt 210.0 lb

## 2016-03-28 DIAGNOSIS — F32A Depression, unspecified: Secondary | ICD-10-CM

## 2016-03-28 DIAGNOSIS — F329 Major depressive disorder, single episode, unspecified: Secondary | ICD-10-CM | POA: Diagnosis not present

## 2016-03-28 DIAGNOSIS — I10 Essential (primary) hypertension: Secondary | ICD-10-CM | POA: Diagnosis not present

## 2016-03-28 NOTE — Assessment & Plan Note (Signed)
bp stable on current meds. Continue same.  

## 2016-03-28 NOTE — Progress Notes (Signed)
Pre visit review using our clinic review tool, if applicable. No additional management support is needed unless otherwise documented below in the visit note. 

## 2016-03-28 NOTE — Assessment & Plan Note (Signed)
Stable on current dose of effexor, continue same.

## 2016-03-28 NOTE — Progress Notes (Signed)
Subjective:    Patient ID: Nicole Quinn, female    DOB: 05-06-1975, 41 y.o.   MRN: [id number]  HPI   Nicole Quinn is a 41 yr old female who presents today for follow up.  1) Depression- She is maintained on effexor. Reports mood is stable. Still feeling "sleepy."   2) HTN- maintained on toprol xl, aldactone. Prn lasix.  Review of Systems     Past Medical History:  Diagnosis Date  . Allergy   . Anemia   . Endometriosis   . History of UTI   . Hypertension      Social History   Social History  . Marital status: Single    Spouse name: N/A  . Number of children: 2  . Years of education: N/A   Occupational History  .  [place] History Main Topics  . Smoking status: Never Smoker  . Smokeless tobacco: Never Used  . Alcohol use No  . Drug use: Unknown  . Sexual activity: Not on file   Other Topics Concern  . Not on file   Social History Narrative   Regular exercise: yes.   Lives with one child.  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            Past Surgical History:  Procedure Laterality Date  . ABDOMINAL HYSTERECTOMY  2003   partial  . CARPAL TUNNEL RELEASE     right  . GANGLION CYST EXCISION     right  . Hammer Toe Repair Bilateral 12/29/2013   Lt #3, #4, Rt #2.    Family History  Problem Relation Age of Onset  . Allergies Daughter   . Allergies Son   . Leukemia Paternal Grandmother   . Arthritis Mother   . Hyperlipidemia Mother   . Hypertension Mother   . Hypertension Father   . Diabetes Father   . Arthritis Maternal Grandmother   . Stroke Maternal Grandmother 50  . Hypertension Maternal Grandmother   . Coronary artery disease Maternal Aunt 50  . Coronary artery disease Maternal Aunt 50    Allergies  Allergen Reactions  . Amoxicillin     rash  . Ace Inhibitors     REACTION: lip swelling    Current Outpatient Prescriptions on File Prior to Visit  Medication  Sig Dispense Refill  . adapalene (DIFFERIN) 0.1 % cream     . ALPRAZolam (XANAX) 0.25 MG tablet Take 1 tablet (0.25 mg total) by mouth 3 (three) times daily as needed. for anxiety 90 tablet 0  . furosemide (LASIX) 20 MG tablet Take 1 tablet (20 mg total) by mouth daily as needed. 90 tablet 1  . hydroquinone 4 % cream     . Lorcaserin HCl 10 MG TABS Take 1 tablet by mouth 2 (two) times daily. 180 tablet 0  . metoprolol succinate (TOPROL XL) 100 MG 24 hr tablet Take 1 tablet (100 mg total) by mouth daily. Take with or immediately following a meal. 30 tablet 2  . Multiple Vitamin (MULTIVITAMIN) tablet Take 1 tablet by mouth daily.      . potassium chloride SA (K-DUR,KLOR-CON) 20 MEQ tablet Take 1 tablet twice a day. 90 tablet 1  . Scar Treatment Products (RECEDO) GEL Apply once daily    . spironolactone (ALDACTONE) 25 MG tablet Take 1 tablet (25 mg total) by mouth daily. 90 tablet 1  . TRULANCE 3 MG TABS Take 1 tablet  by mouth daily.    Marland Kitchen venlafaxine XR (EFFEXOR-XR) 150 MG 24 hr capsule Take 1 capsule (150 mg total) by mouth daily with breakfast. 14 capsule 0   No current facility-administered medications on file prior to visit.     BP 106/75 (BP Location: Right Arm, Cuff Size: Normal)   Pulse 65   Temp 98.2 F (36.8 C) (Oral)   Resp 16   Ht 5\' 5"  (1.651 m)   Wt 210 lb (95.3 kg)   SpO2 100% Comment: room air  BMI 34.95 kg/m    Objective:   Physical Exam  Constitutional: She is oriented to person, place, and time. She appears well-developed and well-nourished.  Cardiovascular: Normal rate, regular rhythm and normal heart sounds.   No murmur heard. Pulmonary/Chest: Effort normal and breath sounds normal. No respiratory distress. She has no wheezes.  Neurological: She is alert and oriented to person, place, and time.  Skin: Skin is warm and dry.  Psychiatric: She has a normal mood and affect. Her behavior is normal. Judgment and thought content normal.          Assessment &  Plan:

## 2016-03-31 ENCOUNTER — Telehealth: Payer: Self-pay | Admitting: Family

## 2016-04-01 NOTE — Telephone Encounter (Signed)
Belviq rx was printed yesterday by PCP for 30 day supply. Rx is being held in blue folder on CMA desk until pt responds to email re: belviq [facility] on 03/10/16. Awaiting response.

## 2016-04-01 NOTE — Telephone Encounter (Signed)
Spoke with Ronny Bacon at [employer] [phone]) re: Melynda Keller PA. Provided dx of E66.9 (obesity and current BMI) and that pt has followed a 1200 calorie diet along with cardio exercise 4-5 times a week at least 3 months without weight loss. Received approval ID [id number] good from 03/02/16 through 07/02/16. Notified pt of approval via mychart.

## 2016-04-16 ENCOUNTER — Encounter: Payer: Self-pay | Admitting: Family

## 2016-04-16 DIAGNOSIS — R946 Abnormal results of thyroid function studies: Secondary | ICD-10-CM

## 2016-04-22 ENCOUNTER — Other Ambulatory Visit (INDEPENDENT_AMBULATORY_CARE_PROVIDER_SITE_OTHER): Payer: PRIVATE HEALTH INSURANCE

## 2016-04-22 DIAGNOSIS — R946 Abnormal results of thyroid function studies: Secondary | ICD-10-CM

## 2016-04-22 LAB — T3, FREE: T3, Free: 3.7 pg/mL (ref 2.3–4.2)

## 2016-04-22 LAB — TSH: TSH: 2.19 u[IU]/mL (ref 0.35–4.50)

## 2016-04-22 LAB — T4, FREE: Free T4: 0.63 ng/dL (ref 0.60–1.60)

## 2016-04-24 ENCOUNTER — Encounter: Payer: Self-pay | Admitting: Family

## 2016-05-01 ENCOUNTER — Telehealth: Payer: Self-pay | Admitting: Family

## 2016-05-01 DIAGNOSIS — R609 Edema, unspecified: Secondary | ICD-10-CM

## 2016-05-01 NOTE — Telephone Encounter (Signed)
See mychart message and order.

## 2016-05-02 ENCOUNTER — Other Ambulatory Visit: Payer: Self-pay | Admitting: Family

## 2016-05-02 ENCOUNTER — Other Ambulatory Visit: Payer: Self-pay

## 2016-05-02 NOTE — Telephone Encounter (Signed)
Pt requesting 90 day supply of Alprazolam through mail order. Please advise.

## 2016-05-02 NOTE — Telephone Encounter (Signed)
Called patient left message for pt to call the office back.  PC

## 2016-05-02 NOTE — Telephone Encounter (Signed)
Please let patient know that I reviewed the controlled substance registry.  I see that in addition to the Belviq that we gave her and her xanax, that she is receiving phentermine from another provider. This violates her controlled substance contract and therefore I will be unable to provide her with any additional controlled medication.s.

## 2016-05-02 NOTE — Telephone Encounter (Signed)
Rx request for HCTZ Denied, provider made change in therapy [to Spironolactone] on 01/29/16; request for Furosemide sent to pharmacy. Pt has been referred to [facility] for LE Edema and they are awaiting call back from patient to schedule appointment/SLS 04/13  Procedure Information - Referral Procedure Modifiers Provider Requested Approved  [id number] - Ambulatory referral to Vascular Surgery   1 1  Diagnosis Information  Diagnosis  R60.9 (ICD-10-CM) - Edema, unspecified type  Referral Notes  Number of Notes: 3  Type Date User Summary Attachment  General 05/02/2016 10:49 AM Janalyn Harder McKenzie - -  Note   Left message for patient to call and schedule an appointment         Type Date User Summary Attachment  General 05/01/2016 8:21 AM Margot Ables - -  Note   In workqueue for [facility] in [place], waiting for appt        Type Date User Summary Attachment  Provider Comments 05/01/2016 8:15 AM Debbrah Alar, NP Provider Comments -  Note   41 yr old female with chronic LE edema.  Please evaluate.  ? Venous insufficiency?        Referral Order  Order  Ambulatory referral to Vascular Surgery (Order # [id number]) on 05/01/2016  View Encounter

## 2016-05-05 ENCOUNTER — Other Ambulatory Visit: Payer: Self-pay | Admitting: *Deleted

## 2016-05-05 DIAGNOSIS — M7989 Other specified soft tissue disorders: Secondary | ICD-10-CM

## 2016-05-05 DIAGNOSIS — M79669 Pain in unspecified lower leg: Secondary | ICD-10-CM

## 2016-05-05 NOTE — Telephone Encounter (Signed)
Attempted to reach pt by phone and left message to check mychart account. Message sent.

## 2016-05-13 ENCOUNTER — Ambulatory Visit: Payer: PRIVATE HEALTH INSURANCE | Admitting: Physician Assistant

## 2016-05-14 ENCOUNTER — Encounter: Payer: Self-pay | Admitting: Surgery

## 2016-05-15 ENCOUNTER — Encounter: Payer: Self-pay | Admitting: Family

## 2016-05-15 DIAGNOSIS — R609 Edema, unspecified: Secondary | ICD-10-CM

## 2016-05-18 ENCOUNTER — Other Ambulatory Visit: Payer: Self-pay | Admitting: Family

## 2016-05-19 NOTE — Telephone Encounter (Signed)
Refill sent per [facility] refill protocol/SLS LOV: 03/28/16 ROV: 06/28/16

## 2016-05-20 ENCOUNTER — Other Ambulatory Visit: Payer: Self-pay | Admitting: Surgery

## 2016-05-20 DIAGNOSIS — M79604 Pain in right leg: Secondary | ICD-10-CM

## 2016-05-20 DIAGNOSIS — R600 Localized edema: Secondary | ICD-10-CM

## 2016-05-20 DIAGNOSIS — M79605 Pain in left leg: Secondary | ICD-10-CM

## 2016-05-21 ENCOUNTER — Ambulatory Visit (HOSPITAL_COMMUNITY)
Admission: RE | Admit: 2016-05-21 | Discharge: 2016-05-21 | Disposition: A | Discharge status: Home / Self Care | Payer: PRIVATE HEALTH INSURANCE | Source: Ambulatory Visit | Attending: Surgery | Admitting: Surgery

## 2016-05-21 ENCOUNTER — Telehealth: Payer: Self-pay | Admitting: Family

## 2016-05-21 ENCOUNTER — Other Ambulatory Visit: Payer: Self-pay | Admitting: Surgery

## 2016-05-21 ENCOUNTER — Ambulatory Visit (INDEPENDENT_AMBULATORY_CARE_PROVIDER_SITE_OTHER): Payer: PRIVATE HEALTH INSURANCE | Admitting: Surgery

## 2016-05-21 ENCOUNTER — Encounter: Payer: Self-pay | Admitting: Surgery

## 2016-05-21 VITALS — BP 143/90 | HR 76 | Temp 98.7°F | Resp 18 | Ht 65.0 in | Wt 228.0 lb

## 2016-05-21 DIAGNOSIS — M79604 Pain in right leg: Secondary | ICD-10-CM | POA: Insufficient documentation

## 2016-05-21 DIAGNOSIS — M79605 Pain in left leg: Secondary | ICD-10-CM | POA: Diagnosis not present

## 2016-05-21 DIAGNOSIS — R600 Localized edema: Secondary | ICD-10-CM | POA: Diagnosis not present

## 2016-05-21 DIAGNOSIS — M25562 Pain in left knee: Secondary | ICD-10-CM

## 2016-05-21 DIAGNOSIS — M25561 Pain in right knee: Secondary | ICD-10-CM

## 2016-05-21 DIAGNOSIS — I872 Venous insufficiency (chronic) (peripheral): Secondary | ICD-10-CM | POA: Diagnosis not present

## 2016-05-21 DIAGNOSIS — R609 Edema, unspecified: Secondary | ICD-10-CM

## 2016-05-21 NOTE — Progress Notes (Signed)
[facility]ist of [facility]  Patient name: Nicole Quinn MRN: [id number] DOB: 1975/06/10 Sex: female   REFERRING PROVIDER:    Dr. Inda Castle   REASON FOR CONSULT:    Bilateral edema  HISTORY OF PRESENT ILLNESS:   Nicole Quinn is a 41 y.o. female, who is Referred today for evaluation of bilateral leg swelling.  The patient states that this is been going on for approximately one year.  She also states that for the past 2-3 months that she is getting swelling in both of her arms and neck.  She has worn compression stockings on her legs and this does help but she does not wish to wear them permanently.  She does not have any open wounds.  There does not appear to be any aggravating or inciting factors.  The patient has a history of hypertension which was diagnosed 18 years ago at the birth of her son when she had preeclampsia.  She is been on multiple medications in the past.  She did have a reaction to lisinopril which caused lip swelling.  PAST MEDICAL HISTORY    Past Medical History:  Diagnosis Date  . Allergy   . Anemia   . Endometriosis   . History of UTI   . Hypertension      FAMILY HISTORY   Family History  Problem Relation Age of Onset  . Allergies Daughter   . Allergies Son   . Leukemia Paternal Grandmother   . Arthritis Mother   . Hyperlipidemia Mother   . Hypertension Mother   . Hypertension Father   . Diabetes Father   . Arthritis Maternal Grandmother   . Stroke Maternal Grandmother 50  . Hypertension Maternal Grandmother   . Coronary artery disease Maternal Aunt 50  . Coronary artery disease Maternal Aunt 50    SOCIAL HISTORY:   Social History   Social History  . Marital status: Single    Spouse name: N/A  . Number of children: 2  . Years of education: N/A   Occupational History  .  [place] History Main Topics  .  Smoking status: Never Smoker  . Smokeless tobacco: Never Used  . Alcohol use No  . Drug use: Unknown  . Sexual activity: Not on file   Other Topics Concern  . Not on file   Social History Narrative   Regular exercise: yes.   Lives with one child.  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            ALLERGIES:    Allergies  Allergen Reactions  . Amoxicillin     rash  . Ace Inhibitors     REACTION: lip swelling    CURRENT MEDICATIONS:    Current Outpatient Prescriptions  Medication Sig Dispense Refill  . adapalene (DIFFERIN) 0.1 % cream     . ALPRAZolam (XANAX) 0.25 MG tablet Take 1 tablet (0.25 mg total) by mouth 3 (three) times daily as needed. for anxiety 90 tablet 0  . BELVIQ 10 MG TABS TAKE ONE TABLET BY MOUTH TWICE DAILY 60 tablet 0  . furosemide (LASIX) 20 MG tablet TAKE ONE TABLET BY MOUTH ONCE DAILY AS NEEDED 90 tablet 1  . hydroquinone 4 % cream     . metoprolol succinate (TOPROL XL) 100 MG 24 hr tablet Take 1 tablet (100 mg total) by mouth daily. Take with or immediately following a meal. 30 tablet 2  .  Multiple Vitamin (MULTIVITAMIN) tablet Take 1 tablet by mouth daily.      . potassium chloride SA (K-DUR,KLOR-CON) 20 MEQ tablet Take 1 tablet twice a day. 90 tablet 1  . Scar Treatment Products (RECEDO) GEL Apply once daily    . spironolactone (ALDACTONE) 25 MG tablet Take 1 tablet (25 mg total) by mouth daily. 90 tablet 1  . venlafaxine XR (EFFEXOR-XR) 150 MG 24 hr capsule TAKE 1 CAPSULE DAILY WITH BREAKFAST 90 capsule 0  . TRULANCE 3 MG TABS Take 1 tablet by mouth daily.     No current facility-administered medications for this visit.     REVIEW OF SYSTEMS:   [X]  denotes positive finding, [ ]  denotes negative finding Cardiac  Comments:  Chest pain or chest pressure:    Shortness of breath upon exertion: x   Short of breath when lying flat:    Irregular heart rhythm:        Vascular    Pain in calf, thigh, or hip brought on by ambulation: x   Pain  in feet at night that wakes you up from your sleep:     Blood clot in your veins:    Leg swelling:  x       Pulmonary    Oxygen at home:    Productive cough:     Wheezing:         Neurologic    Sudden weakness in arms or legs:     Sudden numbness in arms or legs:     Sudden onset of difficulty speaking or slurred speech:    Temporary loss of vision in one eye:     Problems with dizziness:         Gastrointestinal    Blood in stool:      Vomited blood:         Genitourinary    Burning when urinating:     Blood in urine:        Psychiatric    Major depression:         Hematologic    Bleeding problems:    Problems with blood clotting too easily:        Skin    Rashes or ulcers: x       Constitutional    Fever or chills:     PHYSICAL EXAM:   Vitals:   05/21/16 1258 05/21/16 1300  BP: (!) 143/95 (!) 143/90  Pulse: 76   Resp: 18   Temp: 98.7 F (37.1 C)   TempSrc: Oral   SpO2: 98%   Weight: 228 lb (103.4 kg)   Height: 5\' 5"  (1.651 m)     GENERAL: The patient is a well-nourished female, in no acute distress. The vital signs are documented above. CARDIAC: There is a regular rate and rhythm.  VASCULAR: Palpable pedal pulses.  Trace edema bilaterally PULMONARY: Nonlabored respirations MUSCULOSKELETAL: There are no major deformities or cyanosis. NEUROLOGIC: No focal weakness or paresthesias are detected. SKIN: There are no ulcers or rashes noted. PSYCHIATRIC: The patient has a normal affect.  STUDIES:   I have reviewed her vascular lab studies.  There is no evidence of DVT.  There is reflux in the bilateral common femoral vein and left femoral vein.  There is no superficial venous insufficiency  ASSESSMENT and PLAN   Bilateral leg swelling: The patient does not have correctable axial reflux in either extremity.  Her venous incompetence is restricted to the deep venous system which is treated with leg  elevation and compression.  Because the patient also  endorses arm swelling, I wonder if she has a more systemic issues such as an autoimmune disorder or cardiac or renal dysfunction.  I mentioned this to her and told her she could follow up with Dr. Inda Castle to determine if additional testing is warranted.  From a vascular perspective, long-term compression therapy is recommended.  We had a lengthy discussion today and I answered all of her questions.  She seemed comfortable and satisfied with our interactions.   Annamarie Major, MD [facility] [phone] Pager [phone]

## 2016-05-21 NOTE — Telephone Encounter (Signed)
I reviewed her consult from vascular. He is recommending autoimmune studies.  I have pended the labs for her to complete.

## 2016-05-22 ENCOUNTER — Ambulatory Visit: Payer: PRIVATE HEALTH INSURANCE | Admitting: Cardiovascular Disease

## 2016-05-22 ENCOUNTER — Other Ambulatory Visit: Payer: Self-pay | Admitting: Family

## 2016-05-22 NOTE — Telephone Encounter (Signed)
Denied - Last Rx has remaining refill with Mail Order/SLS 05/03  Medication Detail   Disp Refills Start End   spironolactone (ALDACTONE) 25 MG tablet 90 tablet 1 03/12/2016    Sig - Route: Take 1 tablet (25 mg total) by mouth daily. - Oral   Notes to Pharmacy: Please consider 90 day supplies to promote better adherence   E-Prescribing Status: Receipt confirmed by pharmacy (03/12/2016 3:30 PM EST)   Pharmacy   [facility], [place]

## 2016-05-27 ENCOUNTER — Encounter: Payer: Self-pay | Admitting: Family

## 2016-05-27 NOTE — Telephone Encounter (Signed)
See 05/27/16 pt email.

## 2016-05-29 ENCOUNTER — Other Ambulatory Visit (INDEPENDENT_AMBULATORY_CARE_PROVIDER_SITE_OTHER): Payer: PRIVATE HEALTH INSURANCE

## 2016-05-29 ENCOUNTER — Encounter: Payer: Self-pay | Admitting: Physician Assistant

## 2016-05-29 DIAGNOSIS — R609 Edema, unspecified: Secondary | ICD-10-CM

## 2016-05-29 LAB — SEDIMENTATION RATE: Sed Rate: 21 mm/hr — ABNORMAL HIGH (ref 0–20)

## 2016-05-30 LAB — ANA: Anti Nuclear Antibody(ANA): NEGATIVE

## 2016-05-30 LAB — RHEUMATOID FACTOR: Rhuematoid fact SerPl-aCnc: <14 IU/mL (ref <14)

## 2016-06-02 ENCOUNTER — Encounter: Payer: Self-pay | Admitting: Family

## 2016-06-02 ENCOUNTER — Ambulatory Visit: Payer: PRIVATE HEALTH INSURANCE | Admitting: Physician Assistant

## 2016-06-02 DIAGNOSIS — R609 Edema, unspecified: Secondary | ICD-10-CM

## 2016-06-02 NOTE — Telephone Encounter (Signed)
See previous email

## 2016-06-05 ENCOUNTER — Encounter (HOSPITAL_BASED_OUTPATIENT_CLINIC_OR_DEPARTMENT_OTHER): Payer: Self-pay | Admitting: Emergency Medicine

## 2016-06-05 ENCOUNTER — Emergency Department (HOSPITAL_BASED_OUTPATIENT_CLINIC_OR_DEPARTMENT_OTHER): Payer: PRIVATE HEALTH INSURANCE

## 2016-06-05 ENCOUNTER — Encounter: Payer: Self-pay | Admitting: Family

## 2016-06-05 ENCOUNTER — Emergency Department (HOSPITAL_BASED_OUTPATIENT_CLINIC_OR_DEPARTMENT_OTHER)
Admission: EM | Admit: 2016-06-05 | Discharge: 2016-06-05 | Disposition: A | Discharge status: Home / Self Care | Payer: PRIVATE HEALTH INSURANCE | Attending: Emergency Medicine | Admitting: Emergency Medicine

## 2016-06-05 ENCOUNTER — Ambulatory Visit (INDEPENDENT_AMBULATORY_CARE_PROVIDER_SITE_OTHER): Payer: PRIVATE HEALTH INSURANCE | Admitting: Family

## 2016-06-05 VITALS — BP 152/94 | HR 88 | Temp 98.3°F | Resp 18 | Ht 65.0 in | Wt 233.0 lb

## 2016-06-05 DIAGNOSIS — R609 Edema, unspecified: Secondary | ICD-10-CM

## 2016-06-05 DIAGNOSIS — I10 Essential (primary) hypertension: Secondary | ICD-10-CM | POA: Diagnosis not present

## 2016-06-05 DIAGNOSIS — R110 Nausea: Secondary | ICD-10-CM

## 2016-06-05 DIAGNOSIS — R700 Elevated erythrocyte sedimentation rate: Secondary | ICD-10-CM | POA: Diagnosis not present

## 2016-06-05 DIAGNOSIS — R600 Localized edema: Secondary | ICD-10-CM | POA: Insufficient documentation

## 2016-06-05 DIAGNOSIS — Z79899 Other long term (current) drug therapy: Secondary | ICD-10-CM | POA: Insufficient documentation

## 2016-06-05 LAB — CBC
HCT: 34.4 % — ABNORMAL LOW (ref 36.0–46.0)
Hemoglobin: 11.9 g/dL — ABNORMAL LOW (ref 12.0–15.0)
MCH: 31.6 pg (ref 26.0–34.0)
MCHC: 34.6 g/dL (ref 30.0–36.0)
MCV: 91.2 fL (ref 78.0–100.0)
Platelets: 299 10*3/uL (ref 150–400)
RBC: 3.77 MIL/uL — ABNORMAL LOW (ref 3.87–5.11)
RDW: 12.8 % (ref 11.5–15.5)
WBC: 10.1 10*3/uL (ref 4.0–10.5)

## 2016-06-05 LAB — PROTIME-INR
INR: 0.93
Prothrombin Time: 12.5 seconds (ref 11.4–15.2)

## 2016-06-05 LAB — URINALYSIS, ROUTINE W REFLEX MICROSCOPIC
Bilirubin Urine: NEGATIVE
Glucose, UA: NEGATIVE mg/dL
Hgb urine dipstick: NEGATIVE
Ketones, ur: NEGATIVE mg/dL
Leukocytes, UA: NEGATIVE
Nitrite: NEGATIVE
Protein, ur: NEGATIVE mg/dL
Specific Gravity, Urine: 1.015 (ref 1.005–1.030)
pH: 5.5 (ref 5.0–8.0)

## 2016-06-05 LAB — HEPATIC FUNCTION PANEL
ALT: 17 U/L (ref 14–54)
AST: 25 U/L (ref 15–41)
Albumin: 3.8 g/dL (ref 3.5–5.0)
Alkaline Phosphatase: 75 U/L (ref 38–126)
Bilirubin, Direct: <0.1 mg/dL — ABNORMAL LOW (ref 0.1–0.5)
Total Bilirubin: 0.2 mg/dL — ABNORMAL LOW (ref 0.3–1.2)
Total Protein: 7.2 g/dL (ref 6.5–8.1)

## 2016-06-05 LAB — BASIC METABOLIC PANEL
Anion gap: 8 (ref 5–15)
BUN: 9 mg/dL (ref 6–20)
CO2: 25 mmol/L (ref 22–32)
Calcium: 9 mg/dL (ref 8.9–10.3)
Chloride: 105 mmol/L (ref 101–111)
Creatinine, Ser: 0.69 mg/dL (ref 0.44–1.00)
GFR calc Af Amer: >60 mL/min (ref >60)
GFR calc non Af Amer: >60 mL/min (ref >60)
Glucose, Bld: 87 mg/dL (ref 65–99)
Potassium: 3.4 mmol/L — ABNORMAL LOW (ref 3.5–5.1)
Sodium: 138 mmol/L (ref 135–145)

## 2016-06-05 LAB — TROPONIN I: Troponin I: <0.03 ng/mL (ref <0.03)

## 2016-06-05 LAB — BRAIN NATRIURETIC PEPTIDE: B Natriuretic Peptide: 29 pg/mL (ref 0.0–100.0)

## 2016-06-05 LAB — SEDIMENTATION RATE: Sed Rate: 10 mm/hr (ref 0–20)

## 2016-06-05 MED ORDER — CLONIDINE HCL 0.1 MG PO TABS: 0.1000 mg | ORAL_TABLET | Freq: Two times a day (BID) | ORAL | 3 refills | Status: DC

## 2016-06-05 NOTE — ED Notes (Signed)
Pt. Has positive pedal pulses in L and R foot.

## 2016-06-05 NOTE — Patient Instructions (Addendum)
Please complete lab work prior to leaving. Add clonidine twice daily for blood pressure.

## 2016-06-05 NOTE — Discharge Instructions (Signed)
Please follow-up with your primary doctor for further management of your edema and nausea. Please follow-up with your specialist with cardiology and rheumatology. If any symptoms change or worsen, please return to the nearest emergency department.

## 2016-06-05 NOTE — ED Provider Notes (Signed)
[facility] Provider Note   CSN: [id number] Arrival date & time: 06/05/16  1748  By signing my name below, I, Jaquelyn Bitter., attest that this documentation has been prepared under the direction and in the presence of Tegeler, Gwenyth Allegra, *. Electronically signed: Jaquelyn Bitter., ED Scribe. 06/05/16. 8:12 PM.   History   Chief Complaint Chief Complaint  Patient presents with  . Leg Swelling    HPI Nicole Quinn is a 41 y.o. female who presents to the Emergency Department complaining of leg swelling with onset x2 weeks. Pt states that for the past x2 weeks she has noticed worsening generalized swelling and states that her blood pressure has been elevated. She reports being seen by her PCP today and was placed on Clonidine for blood pressure. Pt notes being seen by her vascular doctor x2 weeks ago for leg swelling and upon ultrasound, she was told that she had abnormal veins in the groin. At this time she was told to wear compression pants to reduce swelling. Pt states that she returned to the ED because she did not feel well today. Pt reports leg swelling, dizziness, lightheadedness, SOB, myalgias, urine decreased. She denies any modifying factors. Pt denies fever, chills, frequency, dysuria, constipation, diarrhea, vaginal discharge/bleeidng, vomiting, appetite change, abdominal pain, chest pain, rhinorrhea, congestion, cough. She reports family hx of HTN, maternal stroke. She denies recent injury, recent tick exposure. Of note, pt takes Metoprolol, Lasix, Spironolactone and Clonidine for blood pressure. Pt recently had autoimmune tests which were normal and is scheduled to see a rheumatologist in July.  The history is provided by the patient and the spouse. No language interpreter was used.  Illness  This is a chronic problem. The current episode started more than 1 week ago. The problem occurs constantly. The problem has been gradually worsening.  Associated symptoms include shortness of breath. Pertinent negatives include no chest pain, no abdominal pain and no headaches. Nothing aggravates the symptoms. Nothing relieves the symptoms. She has tried nothing for the symptoms. The treatment provided no relief.    Past Medical History:  Diagnosis Date  . Allergy   . Anemia   . Endometriosis   . History of UTI   . Hypertension     Patient Active Problem List   Diagnosis Date Noted  . Depression 01/29/2016  . Left wrist pain 11/29/2015  . Obesity (BMI 30.0-34.9) 08/12/2013  . Other and unspecified hyperlipidemia 02/24/2013  . Chronic constipation 02/22/2013  . Routine general medical examination at a health care facility 02/22/2013  . Anxiety associated with depression 10/10/2011  . OTHER ACNE 06/04/2009  . HYPERGLYCEMIA, BORDERLINE 06/04/2009  . Essential hypertension 04/10/2009    Past Surgical History:  Procedure Laterality Date  . ABDOMINAL HYSTERECTOMY  2003   partial  . CARPAL TUNNEL RELEASE     right  . GANGLION CYST EXCISION     right  . Hammer Toe Repair Bilateral 12/29/2013   Lt #3, #4, Rt #2.    OB History    No data available       Home Medications    Prior to Admission medications   Medication Sig Start Date End Date Taking? Authorizing Provider  adapalene (DIFFERIN) 0.1 % cream  10/02/15   [provider]  ALPRAZolam (XANAX) 0.25 MG tablet Take 1 tablet (0.25 mg total) by mouth 3 (three) times daily as needed. for anxiety 03/10/16   Debbrah Alar, NP  BELVIQ 10 MG TABS TAKE ONE TABLET BY  MOUTH TWICE DAILY 03/31/16   Debbrah Alar, NP  cloNIDine (CATAPRES) 0.1 MG tablet Take 1 tablet (0.1 mg total) by mouth 2 (two) times daily. 06/05/16   Debbrah Alar, NP  furosemide (LASIX) 20 MG tablet TAKE ONE TABLET BY MOUTH ONCE DAILY AS NEEDED 05/02/16   Debbrah Alar, NP  hydroquinone 4 % cream  10/19/15   [provider]  metoprolol succinate (TOPROL XL) 100 MG 24 hr  tablet Take 1 tablet (100 mg total) by mouth daily. Take with or immediately following a meal. 02/13/16   Debbrah Alar, NP  Multiple Vitamin (MULTIVITAMIN) tablet Take 1 tablet by mouth daily.      [provider]  potassium chloride SA (K-DUR,KLOR-CON) 20 MEQ tablet Take 1 tablet twice a day. 02/04/16   Debbrah Alar, NP  Scar Treatment Products (RECEDO) GEL Apply once daily 06/27/15   [provider]  spironolactone (ALDACTONE) 25 MG tablet Take 1 tablet (25 mg total) by mouth daily. 03/12/16   Debbrah Alar, NP  TRULANCE 3 MG TABS Take 1 tablet by mouth daily. 06/12/15   [provider]  venlafaxine XR (EFFEXOR-XR) 150 MG 24 hr capsule TAKE 1 CAPSULE DAILY WITH BREAKFAST 05/19/16   Debbrah Alar, NP    Family History Family History  Problem Relation Age of Onset  . Allergies Daughter   . Allergies Son   . Leukemia Paternal Grandmother   . Arthritis Mother   . Hyperlipidemia Mother   . Hypertension Mother   . Hypertension Father   . Diabetes Father   . Arthritis Maternal Grandmother   . Stroke Maternal Grandmother 50  . Hypertension Maternal Grandmother   . Coronary artery disease Maternal Aunt 50  . Coronary artery disease Maternal Aunt 50    Social History Social History  Substance Use Topics  . Smoking status: Never Smoker  . Smokeless tobacco: Never Used  . Alcohol use No     Allergies   Amoxicillin and Ace inhibitors   Review of Systems Review of Systems  Constitutional: Negative for appetite change, chills and fever.  HENT: Negative for congestion and rhinorrhea.   Respiratory: Positive for shortness of breath. Negative for cough.   Cardiovascular: Positive for leg swelling. Negative for chest pain.  Gastrointestinal: Negative for abdominal pain, constipation and diarrhea.  Genitourinary: Positive for decreased urine volume. Negative for dysuria, frequency, vaginal bleeding and vaginal discharge.  Musculoskeletal:  Positive for myalgias.  Neurological: Positive for dizziness and light-headedness. Negative for headaches.  All other systems reviewed and are negative.    Physical Exam Updated Vital Signs BP (!) 138/110 (BP Location: Left Arm)   Pulse 83   Temp 98.3 F (36.8 C) (Oral)   Resp 20   Ht 5\' 5"  (1.651 m)   Wt 233 lb (105.7 kg)   SpO2 100%   BMI 38.77 kg/m   Physical Exam  Constitutional: She appears well-developed and well-nourished. No distress.  HENT:  Head: Normocephalic and atraumatic.  Some edema in face.   Eyes: Conjunctivae are normal.  Neck: Neck supple.  Cardiovascular: Normal rate and regular rhythm.   No murmur heard. Pulmonary/Chest: Effort normal and breath sounds normal. No respiratory distress.  Lungs clear.   Abdominal: Soft. There is no tenderness.  Musculoskeletal:       Right lower leg: She exhibits edema.       Left lower leg: She exhibits edema.  Generalized edema in bilateral legs. Pt has generalized tenderness all over but no tenderness noted to the  chest or abdomen.  Neurological: She is alert.  Skin: Skin is warm and dry. There is erythema.  Small erythema in the bilateral ankles.   Psychiatric: She has a normal mood and affect.  Nursing note and vitals reviewed.    ED Treatments / Results   DIAGNOSTIC STUDIES: Oxygen Saturation is 100% on RA, normal by my interpretation.   COORDINATION OF CARE: 8:12 PM-Discussed next steps with pt. Pt verbalized understanding and is agreeable with the plan.    Labs (all labs ordered are listed, but only abnormal results are displayed) Labs Reviewed  BASIC METABOLIC PANEL - Abnormal; Notable for the following:       Result Value   Potassium 3.4 (*)    All other components within normal limits  CBC - Abnormal; Notable for the following:    RBC 3.77 (*)    Hemoglobin 11.9 (*)    HCT 34.4 (*)    All other components within normal limits  HEPATIC FUNCTION PANEL - Abnormal; Notable for the following:      Total Bilirubin 0.2 (*)    Bilirubin, Direct <0.1 (*)    All other components within normal limits  URINE CULTURE  TROPONIN I  PROTIME-INR  BRAIN NATRIURETIC PEPTIDE  URINALYSIS, ROUTINE W REFLEX MICROSCOPIC    EKG  EKG Interpretation None       Radiology Dg Chest 2 View  Result Date: 06/05/2016 CLINICAL DATA:  Left chest discomfort EXAM: CHEST  2 VIEW COMPARISON:  None. FINDINGS: The heart, hila, mediastinum, lungs, and pleura are normal. No pneumothorax. IMPRESSION: No active cardiopulmonary disease. Electronically Signed   By: Dorise Bullion III M.D   On: 06/05/2016 20:02    Procedures Procedures (including critical care time)  Medications Ordered in ED Medications - No data to display   Initial Impression / Assessment and Plan / ED Course  I have reviewed the triage vital signs and the nursing notes.  Pertinent labs & imaging results that were available during my care of the patient were reviewed by me and considered in my medical decision making (see chart for details).     Nicole Quinn is a 41 y.o. female With a past medical history significant for hypertension endometriosis who presents for generalized edema, nausea, general malaise/aching and lightheadedness. Patient says her symptoms have been ongoing for the last few weeks but have continued today after seeing her PCP. Patient was started on a new blood pressure medication which is not yet filled.  History and exam are seen above. On exam, patient had mild generalized edema. Tenderness all over body muscular areas but no specific abdominal or chest tenderness. Lungs are clear. Abdomen had normal bowel sounds. No focal neurologic deficits. No nuchal rigidity.  Patient had workup to look for occult infection, kidney dysfunction, electronic abnormality, or other cause of her symptoms.   Diagnostic testing results are seen above. BNP nonelevated, troponin negative, no evidence of urinary tract infection, no  evidence of pneumonia. Liver function unremarkable, kidney function is normal. No leukocytosis. Anemia similar to prior.  Given reassuring workup, do not feel patient has emergent cause of her generalized edema. Agree with PCP recommendations of specialist evaluation with rheumatology and cardiology. Patient's blood pressure improved in the ED. Patient instructed to continue taking her blood pressure medications and goes her follow-up appointments. Patient was given strict return precautions for any new or worsened symptoms. Patient instructed to stay hydrated.   Patient and family had other questions or concerns and patient was discharged  in good condition with improvement in presenting symptoms.    Final Clinical Impressions(s) / ED Diagnoses   Final diagnoses:  Peripheral edema  Nausea    New Prescriptions Discharge Medication List as of 06/05/2016 11:42 PM     I personally performed the services described in this documentation, which was scribed in my presence. The recorded information has been reviewed and is accurate.  Clinical Impression: 1. Peripheral edema   2. Nausea     Disposition: Discharge  Condition: Good  I have discussed the results, Dx and Tx plan with the pt(& family if present). He/she/they expressed understanding and agree(s) with the plan. Discharge instructions discussed at great length. Strict return precautions discussed and pt &/or family have verbalized understanding of the instructions. No further questions at time of discharge.    Discharge Medication List as of 06/05/2016 11:42 PM      Follow Up: [facility] [address] [place] [address] [phone] Schedule an appointment as soon as possible for a visit    [facility] [address] [id number] [place] [phone] Schedule an appointment as soon as possible for a  visit        Tegeler, Gwenyth Allegra, MD 06/06/16 1310

## 2016-06-05 NOTE — Progress Notes (Signed)
Subjective:    Patient ID: Nicole Quinn, female    DOB: 1975-11-11, 41 y.o.   MRN: [id number]  HPI   Nicole Quinn is a 41 yr old female who presents today with c/o elevated blood pressure, headaches, and ongoing swelling/shortness of breath. Since her last visit she saw vascular and vascular studies did demonstrate some venous insufficiency which is likely a contributing factor to her edema. She is scheduled to see cardiology at the end of the month for consultation as well.  Autoimmune studies showed normal ANA, neg Rheumatoid factor and mildly elevated ESR.  She is scheduled to see rheumatology in July.    Current blood pressure medications include lasix 80m prn swelling, aldactone, toprol xl. Noted that her BP has been elevated.  Often in the 1264'Bdiastolic.   BP Readings from Last 3 Encounters:  06/05/16 (!) 152/94  05/21/16 (!) 143/90  03/28/16 106/75    Review of Systems See HPI    Past Medical History:  Diagnosis Date  . Allergy   . Anemia   . Endometriosis   . History of UTI   . Hypertension      Social History   Social History  . Marital status: Single    Spouse name: N/A  . Number of children: 2  . Years of education: N/A   Occupational History  .  [place] [employer]  . Smoking status: Never Smoker  . Smokeless tobacco: Never Used  . Alcohol use No  . Drug use: No  . Sexual activity: Not on file   Other Topics Concern  . Not on file   Social History Narrative   Regular exercise: yes.   Lives with one child.  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            Past Surgical History:  Procedure Laterality Date  . ABDOMINAL HYSTERECTOMY  2003   partial  . CARPAL TUNNEL RELEASE     right  . GANGLION CYST EXCISION     right  . Hammer Toe Repair Bilateral 12/29/2013   Lt #3, #4, Rt #2.    Family History  Problem Relation Age of Onset  . Allergies Daughter   .  Allergies Son   . Leukemia Paternal Grandmother   . Arthritis Mother   . Hyperlipidemia Mother   . Hypertension Mother   . Hypertension Father   . Diabetes Father   . Arthritis Maternal Grandmother   . Stroke Maternal Grandmother 50  . Hypertension Maternal Grandmother   . Coronary artery disease Maternal Aunt 50  . Coronary artery disease Maternal Aunt 50    Allergies  Allergen Reactions  . Amoxicillin     rash  . Ace Inhibitors     REACTION: lip swelling    Current Outpatient Prescriptions on File Prior to Visit  Medication Sig Dispense Refill  . adapalene (DIFFERIN) 0.1 % cream     . ALPRAZolam (XANAX) 0.25 MG tablet Take 1 tablet (0.25 mg total) by mouth 3 (three) times daily as needed. for anxiety 90 tablet 0  . BELVIQ 10 MG TABS TAKE ONE TABLET BY MOUTH TWICE DAILY 60 tablet 0  . furosemide (LASIX) 20 MG tablet TAKE ONE TABLET BY MOUTH ONCE DAILY AS NEEDED 90 tablet 1  . hydroquinone 4 % cream     . metoprolol succinate (TOPROL XL) 100 MG 24 hr tablet Take 1 tablet (100  mg total) by mouth daily. Take with or immediately following a meal. 30 tablet 2  . Multiple Vitamin (MULTIVITAMIN) tablet Take 1 tablet by mouth daily.      . potassium chloride SA (K-DUR,KLOR-CON) 20 MEQ tablet Take 1 tablet twice a day. 90 tablet 1  . Scar Treatment Products (RECEDO) GEL Apply once daily    . spironolactone (ALDACTONE) 25 MG tablet Take 1 tablet (25 mg total) by mouth daily. 90 tablet 1  . TRULANCE 3 MG TABS Take 1 tablet by mouth daily.    Marland Kitchen venlafaxine XR (EFFEXOR-XR) 150 MG 24 hr capsule TAKE 1 CAPSULE DAILY WITH BREAKFAST 90 capsule 0   No current facility-administered medications on file prior to visit.     BP (!) 152/94 (BP Location: Right Arm, Cuff Size: Large)   Pulse 88   Temp 98.3 F (36.8 C) (Oral)   Resp 18   Ht _0  (1.651 m)   Wt 233 lb (105.7 kg)   SpO2 100%   BMI 38.77 kg/m    Objective:   Physical Exam  Constitutional: She appears well-developed and  well-nourished.  Cardiovascular: Normal rate, regular rhythm and normal heart sounds.   No murmur heard. Pulmonary/Chest: Effort normal and breath sounds normal. No respiratory distress. She has no wheezes.  Musculoskeletal:  2+ bilateral LE edema.    Neurological: She is alert.  Psychiatric: She has a normal mood and affect. Her behavior is normal. Judgment and thought content normal.          Assessment & Plan:  HTN- uncontrolled.  Likely contributing to her SOB/swelling and HA.  Will add clonidine twice daily. She is advised to keep her upcoming appointment with cardiology.  Elevated ESR- repeat today. Has Rheumatology consult pending.

## 2016-06-05 NOTE — ED Triage Notes (Addendum)
Patient states that she is having headaches and dizziness for the last 2 weeks. The pateint went to her MD today that gave her an rx for a new BP pill and to follow with Cards. At the end of the month. Patient states that she is not getting any better tonight and came here to be seen. The patient is in no obvious distress. Patient reports that she has had an increase of her BP as well

## 2016-06-06 ENCOUNTER — Encounter: Payer: Self-pay | Admitting: Family

## 2016-06-07 LAB — URINE CULTURE: Culture: NO GROWTH

## 2016-06-11 ENCOUNTER — Encounter: Payer: Self-pay | Admitting: Family

## 2016-06-13 MED ORDER — CLONIDINE HCL 0.1 MG PO TABS: 0.1000 mg | ORAL_TABLET | Freq: Two times a day (BID) | ORAL | 0 refills | Status: DC

## 2016-06-13 NOTE — Telephone Encounter (Signed)
No further recommendations at this time. She has apt with cardiology on 5/30 and bp can be rechecked at that time.

## 2016-06-13 NOTE — Telephone Encounter (Signed)
Sent 2 week supply of clonidine to [facility]. See pt's response above and advise if any further recommendations?

## 2016-06-13 NOTE — Telephone Encounter (Signed)
Left message for pt to respond to mychart questions or call me directly.

## 2016-06-17 NOTE — Progress Notes (Signed)
Cardiology Office Note:    Date:  06/18/2016   ID:  Nicole Quinn, DOB 1975-12-07, MRN [id number]  PCP:  Debbrah Alar, NP  Cardiologist:  Dr. Minus Breeding (seen in 2013)   Referring MD: Debbrah Alar, NP   Chief Complaint  Patient presents with  . Leg Swelling  . Shortness of Breath  . Chest Pain    History of Present Illness:    Nicole Quinn is a 41 y.o. female with a hx of HTN who is being seen today for the evaluation of edema and shortness of breath at the request of Debbrah Alar, NP. She saw Dr. Minus Breeding in 2013 for the evaluation of shortness of breath.  CPX test at that time was normal.  An echo in 2016 did show normal LV function.    Nicole Quinn is here alone today.  She has had a hx of LE edema off and on for over a year that was previously well managed with prn Lasix.  Over the past 2-3 mos, she notes worsening LE edema that has not really responded well to the Lasix.  It does not seem to be dependent edema.  She did see [facility] and an ultrasound was neg for DVT.  She was asked to wear compression stockings for venous insufficiency and to follow up with her PCP.  A BNP this month was normal.  ANA, RF were also neg.  A ESR was 21 but a follow up was 10.  TSH has been normal.  Albumin has been normal.  She usually exercises 3-4 times a week.  But, recently has been fatigued and short of breath with activity. She has decreased her activity somewhat in response to this.  She has gained about 30 lbs in the last few months.  She denies orthopnea, PND.  She denies syncope.  She does not chest pressure that is fairly constant and seems to get worse with activity.  She has been nauseated at times and diaphoretic.  She denies any chest pain radiating to her jaw or arms.  Overall, her symptoms seem to be worsening.   PAD Screen 06/18/2016  Previous PAD dx? No  Previous surgical procedure? No  Pain with walking? Yes  Subsides with rest? No  Feet/toe relief with  dangling? No  Painful, non-healing ulcers? No  Extremities discolored? No    Prior CV studies:   The following studies were reviewed today:  Venous US 05/21/16 No DVT bilaterally  Echo 03/2014 Vigorous LVF, EF 65-70, no RWMA, mild BAE  Cardiopulmonary Stress Test 09/2011 Conclusion: Exercise testing with gas exchange demonstrates an excellent peak VO2 when compared to matched sedentary norms. There is no evidence of a significant cardiopulmonary abnormality during exercise.    Past Medical History:  Diagnosis Date  . Allergy   . Anemia   . Endometriosis   . History of UTI   . Hypertension    pregnancy induced HTN/pre-eclampsia //  on meds since 2nd child (2000)    Past Surgical History:  Procedure Laterality Date  . ABDOMINAL HYSTERECTOMY  2003   partial  . CARPAL TUNNEL RELEASE     right  . GANGLION CYST EXCISION     right  . Hammer Toe Repair Bilateral 12/29/2013   Lt #3, #4, Rt #2.    Current Medications: Current Meds  Medication Sig  . ALPRAZolam (XANAX) 0.25 MG tablet Take 1 tablet (0.25 mg total) by mouth 3 (three) times daily as needed. for anxiety  . BELVIQ 10  MG TABS TAKE ONE TABLET BY MOUTH TWICE DAILY  . cloNIDine (CATAPRES) 0.1 MG tablet Take 1 tablet (0.1 mg total) by mouth 2 (two) times daily.  . furosemide (LASIX) 20 MG tablet TAKE ONE TABLET BY MOUTH ONCE DAILY AS NEEDED  . metoprolol succinate (TOPROL XL) 100 MG 24 hr tablet Take 1 tablet (100 mg total) by mouth daily. Take with or immediately following a meal.  . Multiple Vitamin (MULTIVITAMIN) tablet Take 1 tablet by mouth daily.    Marland Kitchen spironolactone (ALDACTONE) 25 MG tablet Take 1 tablet (25 mg total) by mouth daily.  Marland Kitchen venlafaxine XR (EFFEXOR-XR) 150 MG 24 hr capsule TAKE 1 CAPSULE DAILY WITH BREAKFAST     Allergies:   Amoxicillin and Ace inhibitors   Social History   Social History  . Marital status: Single    Spouse name: N/A  . Number of children: 2  . Years of education: N/A    Occupational History  .  [employer] History Main Topics  . Smoking status: Never Smoker  . Smokeless tobacco: Never Used  . Alcohol use No  . Drug use: No  . Sexual activity: Not Asked   Other Topics Concern  . None   Social History Narrative   Regular exercise: yes.   Married.  Lives with one child.  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             Family Hx: The patient's family history includes Allergies in her daughter and son; Arthritis in her maternal grandmother and mother; Coronary artery disease (age of onset: 41) in her maternal aunt and maternal aunt; Diabetes in her father; Heart attack (age of onset: 92) in her maternal grandmother; Hyperlipidemia in her mother; Hypertension in her father, maternal grandmother, and mother; Leukemia in her paternal grandmother; Stroke in her mother; Stroke (age of onset: 39) in her maternal grandmother.  ROS:   Please see the history of present illness.    Review of Systems  Constitution: Positive for diaphoresis, malaise/fatigue (daytime hypersomnolence noted) and weight gain.  Cardiovascular: Positive for chest pain, dyspnea on exertion and leg swelling.  Respiratory: Positive for snoring (apnea witnessed by husband).   Hematologic/Lymphatic: Bruises/bleeds easily.  Musculoskeletal: Positive for joint pain.  Gastrointestinal: Positive for nausea.  Neurological: Positive for dizziness and headaches.   All other systems reviewed and are negative.   EKGs/Labs/Other Test Reviewed:    EKG:  EKG is  ordered today.  The ekg ordered today demonstrates NSR, HR 69, normal axis, QTc 405 ms  Recent Labs: 04/22/2016: TSH 2.19 06/05/2016: ALT 17; B Natriuretic Peptide 29.0; BUN 9; Creatinine, Ser 0.69; Hemoglobin 11.9; Platelets 299; Potassium 3.4; Sodium 138   Recent Lipid Panel    Component Value Date/Time   CHOL 222 (H) 01/29/2016 0717   TRIG 49.0  01/29/2016 0717   HDL 62.90 01/29/2016 0717   CHOLHDL 4 01/29/2016 0717   VLDL 9.8 01/29/2016 0717   LDLCALC 149 (H) 01/29/2016 0717     Physical Exam:    VS:  BP 116/90 (BP Location: Left Arm, Patient Position: Sitting, Cuff Size: Large)   Pulse 69   Ht '5\' 4"'$  (1.626 m)   Wt 227 lb 1.9 oz (103 kg)   SpO2 96%   BMI 38.99 kg/m     Wt Readings from Last 3 Encounters:  06/18/16 227 lb 1.9 oz (103 kg)  06/05/16 233 lb (105.7 kg)  06/05/16 233 lb (105.7 kg)     Physical Exam  Constitutional: She is oriented to person, place, and time. She appears well-developed and well-nourished. No distress.  HENT:  Head: Normocephalic and atraumatic.  Eyes: No scleral icterus.  Neck: Normal range of motion. No JVD present. Carotid bruit is not present.  Cardiovascular: Normal rate, regular rhythm, S1 normal, S2 normal and normal heart sounds.   No murmur heard. Pulses:      Dorsalis pedis pulses are 2+ on the right side, and 2+ on the left side.       Posterior tibial pulses are 2+ on the right side, and 2+ on the left side.  Pulmonary/Chest: Breath sounds normal. She has no wheezes. She has no rhonchi. She has no rales.  Abdominal: Soft. There is no tenderness.  Musculoskeletal: She exhibits no edema.  Neurological: She is alert and oriented to person, place, and time.  Skin: Skin is warm and dry.  Psychiatric: She has a normal mood and affect.    ASSESSMENT:    1. Other chest pain   2. Shortness of breath   3. Leg edema   4. Essential hypertension   5. Snoring    PLAN:    In order of problems listed above:  1. Other chest pain -  She has had symptoms of chest pain, dyspnea on exertion, edema for the last few months.  She has typical and atypical features to her chest pain.  Her ECG is normal.  She has CRFs of HTN, HL (LDL 149), remote FHx.  I recommend proceeding with stress testing initially to assess for ischemic heart disease as a cause of her symptoms.    -  Arrange  Exercise Nuclear stress test   2. Shortness of breath -  Etiology not clear.  Her lung exam is normal.  Her physical exam is not suggestive of congestive heart failure. She had a recent BNP that was normal.  She is certainly at risk for diastolic dysfunction with her hx of HTN.  She had an echocardiogram in 2016 that demonstrated normal ejection fraction.  Her CPX test in 2013 was normal.     -  Nuclear stress test as noted above.  -  Arrange echocardiogram   3. Leg edema - Etiology not clear.  Suspect more related to venous insufficiency.  She does have rheumatology appointment pending.  As noted, recent BNP normal.  Also recent TSH and albumin normal.  Hemoglobin was mildly depressed but not significant enough to cause edema.  Would continue prn Lasix for now.  4. Essential hypertension - Clonidine recently started.  Her BP is improving.  Continue current regimen and continue to monitor.   5. Snoring -  She has a hx of snoring, witnessed apnea, fatigue and difficult to control blood pressure.  I question if her symptoms of shortness of breath are related to fatigue from untreated obstructive sleep apnea.  -  Plan: Split night study   Dispo:  Return in about 1 month (around 07/19/2016) for Follow up after testing with Dr. Percival Spanish.    Medication Adjustments/Labs and Tests Ordered: Current medicines are reviewed at length with the patient today.  Concerns regarding medicines are outlined above.  Orders/Tests:  Orders Placed This Encounter  Procedures  . Myocardial Perfusion Imaging  . EKG 12-Lead  . ECHOCARDIOGRAM COMPLETE  . Split night study   Medication changes: No orders of the defined types were placed in this encounter.  Signed, Richardson Dopp, PA-C  06/18/2016 10:41 AM    [address], [place], [place]  [id number] Phone: [phone]; Fax: [phone]   I have reviewed assessment and plan.  Agree with above as stated.  She will f/u with  Dr. Percival Spanish.  Larae Grooms

## 2016-06-18 ENCOUNTER — Encounter: Payer: Self-pay | Admitting: Physician Assistant

## 2016-06-18 ENCOUNTER — Telehealth: Payer: Self-pay | Admitting: *Deleted

## 2016-06-18 ENCOUNTER — Ambulatory Visit (INDEPENDENT_AMBULATORY_CARE_PROVIDER_SITE_OTHER): Payer: PRIVATE HEALTH INSURANCE | Admitting: Physician Assistant

## 2016-06-18 VITALS — BP 116/90 | HR 69 | Ht 64.0 in | Wt 227.1 lb

## 2016-06-18 DIAGNOSIS — R0683 Snoring: Secondary | ICD-10-CM

## 2016-06-18 DIAGNOSIS — R0602 Shortness of breath: Secondary | ICD-10-CM

## 2016-06-18 DIAGNOSIS — R0789 Other chest pain: Secondary | ICD-10-CM

## 2016-06-18 DIAGNOSIS — I10 Essential (primary) hypertension: Secondary | ICD-10-CM

## 2016-06-18 DIAGNOSIS — R600 Localized edema: Secondary | ICD-10-CM | POA: Diagnosis not present

## 2016-06-18 NOTE — Telephone Encounter (Signed)
Received request for all Medical records from [facility] at [facility], Dr. Staci Righter office for continued patient care, forwarded to [place] for email/scan/SLS 05/30

## 2016-06-18 NOTE — Patient Instructions (Signed)
Medication Instructions:  Your physician recommends that you continue on your current medications as directed. Please refer to the Current Medication list given to you today.   Labwork: None ordered.  Testing/Procedures: Your physician has requested that you have en exercise stress myoview. For further information please visit [email or web address]. Please follow instruction sheet, as given.   Your physician has requested that you have an echocardiogram. Echocardiography is a painless test that uses sound waves to create images of your heart. It provides your doctor with information about the size and shape of your heart and how well your heart's chambers and valves are working. This procedure takes approximately one hour. There are no restrictions for this procedure.   Your physician has recommended that you have a split sleep study. This test records several body functions during sleep, including: brain activity, eye movement, oxygen and carbon dioxide blood levels, heart rate and rhythm, breathing rate and rhythm, the flow of air through your mouth and nose, snoring, body muscle movements, and chest and belly movement.  Gershon Cull, CMA will contact you to schedule the sleep study.   Follow-Up: Your physician recommends that you schedule a follow-up appointment in: 1 month with Dr. Percival Spanish   Any Other Special Instructions Will Be Listed Below (If Applicable).     If you need a refill on your cardiac medications before your next appointment, please call your pharmacy.

## 2016-06-18 NOTE — Telephone Encounter (Signed)
-----   Message from Laurel Dimmer, LPN sent at 9/79/8921 10:59 AM EDT ----- Regarding: Split Night Sleep Patient saw Richardson Dopp, PA today. Please set a split night sleep study for patient. Dx is snoring.  Thank you! Mallory Minichello, LPN

## 2016-06-23 NOTE — Telephone Encounter (Signed)
Informed patient of upcoming sleep study and patient understanding was verbalized. Patient understands her sleep study is scheduled for Tuesday July 29 2016. Patient understands her sleep study will be done at [facility] lab. Patient understands she will receive a sleep packet in a week or so. Patient understands to call if she does not receive the sleep packet in a timely manner. Patient agrees with treatment and thanked me for call.

## 2016-06-25 ENCOUNTER — Telehealth (HOSPITAL_COMMUNITY): Payer: Self-pay | Admitting: *Deleted

## 2016-06-25 NOTE — Telephone Encounter (Signed)
Patient given detailed instructions per Myocardial Perfusion Study Information Sheet for the test on 06/27/16. Patient notified to arrive 15 minutes early and that it is imperative to arrive on time for appointment to keep from having the test rescheduled.  If you need to cancel or reschedule your appointment, please call the office within 24 hours of your appointment. . Patient verbalized understanding. Nicole Quinn

## 2016-06-26 ENCOUNTER — Encounter: Payer: Self-pay | Admitting: Allergy and Immunology

## 2016-06-26 ENCOUNTER — Ambulatory Visit (INDEPENDENT_AMBULATORY_CARE_PROVIDER_SITE_OTHER): Payer: PRIVATE HEALTH INSURANCE | Admitting: Allergy and Immunology

## 2016-06-26 VITALS — BP 132/90 | HR 70 | Temp 98.4°F | Resp 16 | Ht 64.0 in | Wt 228.0 lb

## 2016-06-26 DIAGNOSIS — H1013 Acute atopic conjunctivitis, bilateral: Secondary | ICD-10-CM | POA: Diagnosis not present

## 2016-06-26 DIAGNOSIS — T783XXA Angioneurotic edema, initial encounter: Secondary | ICD-10-CM | POA: Insufficient documentation

## 2016-06-26 DIAGNOSIS — T783XXD Angioneurotic edema, subsequent encounter: Secondary | ICD-10-CM | POA: Diagnosis not present

## 2016-06-26 DIAGNOSIS — R0609 Other forms of dyspnea: Secondary | ICD-10-CM | POA: Diagnosis not present

## 2016-06-26 DIAGNOSIS — T7800XA Anaphylactic reaction due to unspecified food, initial encounter: Secondary | ICD-10-CM | POA: Diagnosis not present

## 2016-06-26 DIAGNOSIS — R0600 Dyspnea, unspecified: Secondary | ICD-10-CM

## 2016-06-26 DIAGNOSIS — H1010 Acute atopic conjunctivitis, unspecified eye: Secondary | ICD-10-CM | POA: Insufficient documentation

## 2016-06-26 DIAGNOSIS — L500 Allergic urticaria: Secondary | ICD-10-CM

## 2016-06-26 DIAGNOSIS — J3089 Other allergic rhinitis: Secondary | ICD-10-CM

## 2016-06-26 MED ORDER — EPINEPHRINE 0.3 MG/0.3ML IJ SOAJ: 0.3000 mg | Freq: Once | INTRAMUSCULAR | 1 refills | Status: AC

## 2016-06-26 MED ORDER — OLOPATADINE HCL 0.1 % OP SOLN: 1.0000 [drp] | Freq: Two times a day (BID) | OPHTHALMIC | 5 refills | Status: DC | PRN

## 2016-06-26 MED ORDER — LEVOCETIRIZINE DIHYDROCHLORIDE 5 MG PO TABS: 5.0000 mg | ORAL_TABLET | Freq: Every evening | ORAL | 5 refills | Status: DC

## 2016-06-26 MED ORDER — FLUTICASONE PROPIONATE 50 MCG/ACT NA SUSP: 2.0000 | Freq: Every day | NASAL | 5 refills | Status: DC | PRN

## 2016-06-26 NOTE — Assessment & Plan Note (Signed)
Dyspnea with exertion.  The absence of coughing, chest tightness, wheezing, and normal spirometry wall symptomatic makes bronchial hyperresponsiveness very unlikely.

## 2016-06-26 NOTE — Assessment & Plan Note (Signed)
   Aeroallergen avoidance measures have been discussed and provided in written form.  A prescription has been provided for levocetirizine, 5mg  daily as needed.  A prescription has been provided for fluticasone nasal spray, 2 sprays per nostril daily as needed. Proper nasal spray technique has been discussed and demonstrated.  I have also recommended nasal saline spray (i.e., Simply Saline) or nasal saline lavage (i.e., NeilMed) as needed and prior to medicated nasal sprays.  If allergen avoidance measures and medications fail to adequately relieve symptoms, aeroallergen immunotherapy will be considered.

## 2016-06-26 NOTE — Assessment & Plan Note (Addendum)
Unclear etiology.  Pitting edema, previous response to furosemide, and dyspnea with exertion points to a cardiac etiology, though cardiovascular evaluation has been negative to date. Food allergen tests were reactive to peanut and tree nuts, however the swelling does not seem to correlate with the consumption of nuts. The patient is not taking an ACE inhibitor.  Nicole Quinn had urticaria on at least one occasion in 2013/2014, though not recently. Will order labs to rule out hereditary angioedema, acquired angioedema, urticaria associated angioedema, and other potential etiologies. For symptom relief, patient is to take oral antihistamines as directed. However, if the underlying pathophysiology is bradykinin mediated, antihistamines will not reduce symptoms.  The following labs have been ordered: TSH, antithyroglobulin antibody, thyroid peroxidase antibody, FCeRI antibody, tryptase, C4, C1 esterase inhibitor (quantitative and functional), C1q, factor XII, and galactose-alpha-1,3-galactose IgE level.  The patient will be notified with further recommendations after lab results have returned.  Should significant symptoms recur or new symptoms occure, a  journal is to be kept recording any foods eaten, beverages consumed, medications taken, activities performed, and environmental conditions within a 6 hour period prior to the onset of symptoms. For any symptoms concerning for anaphylaxis, epinephrine is to be administered and 911 is to be called immediately.

## 2016-06-26 NOTE — Progress Notes (Signed)
New Patient Note  RE: Nicole Quinn MRN: [id number] DOB: 02-02-1975 Date of Office Visit: 06/26/2016  Referring provider: Debbrah Alar, NP Primary care provider: Debbrah Alar, NP  Chief Complaint: Angioedema; Shortness of Breath; and Allergic Rhinitis    History of present illness: Nicole Quinn is a 41 y.o. female seen today in consultation requested by Debbrah Alar, NP.  She reports that over the past year she has experienced on and off swelling of the legs.  Initially, the swelling seemed to improve with Lasix, however since January she reports that she has had "consistent swelling" despite Lasix.  In addition, since January she experiences occasional mild swelling of the hands, arms, and face.  She is not taking an ACE inhibitor.  She wears compression stockings and states that the edema is occasionally pitting.  She denies urticaria over this past year, however in 2013/2014 she was evaluated in this office for pruritus and urticaria. No significant seasonal symptom variation has been noted nor have specific environmental triggers been identified.  She complains of dyspnea with mild exertion.  She denies coughing, wheezing, or the sensation of chest tightness.  Venous ultrasound in May 2018 was negative for DVTs, echocardiogram revealed normal left that regular function in March 2016, and cardiopulmonary stress test was negative in September 2013.  She apparently will be going in for a repeat echocardiogram and stress test tomorrow. Berdell experiences frequent nasal congestion, rhinorrhea, sneezing, nasal pruritus, ocular pruritus, postnasal drainage, and sinus pressure.  These symptoms occur year around but are most frequent and severe with pollen exposure.  She currently attempts to control the symptoms with diphenhydramine and cetirizine. Nicole Quinn reports that she experiences oral pruritus with the consumption of peanuts and tree nuts.  She does not expand concomitant  cardiopulmonary or other GI symptoms.   Assessment and plan: Angioedema Unclear etiology.  Pitting edema, previous response to furosemide, and dyspnea with exertion points to a cardiac etiology, though cardiovascular evaluation has been negative to date. Food allergen tests were reactive to peanut and tree nuts, however the swelling does not seem to correlate with the consumption of nuts. The patient is not taking an ACE inhibitor.  Tove had urticaria on at least one occasion in 2013/2014, though not recently. Will order labs to rule out hereditary angioedema, acquired angioedema, urticaria associated angioedema, and other potential etiologies. For symptom relief, patient is to take oral antihistamines as directed. However, if the underlying pathophysiology is bradykinin mediated, antihistamines will not reduce symptoms.  The following labs have been ordered: TSH, antithyroglobulin antibody, thyroid peroxidase antibody, FCeRI antibody, tryptase, C4, C1 esterase inhibitor (quantitative and functional), C1q, factor XII, and galactose-alpha-1,3-galactose IgE level.  The patient will be notified with further recommendations after lab results have returned.  Should significant symptoms recur or new symptoms occure, a  journal is to be kept recording any foods eaten, beverages consumed, medications taken, activities performed, and environmental conditions within a 6 hour period prior to the onset of symptoms. For any symptoms concerning for anaphylaxis, epinephrine is to be administered and 911 is to be called immediately.  Seasonal and perennial allergic rhinitis  Aeroallergen avoidance measures have been discussed and provided in written form.  A prescription has been provided for levocetirizine, 5mg  daily as needed.  A prescription has been provided for fluticasone nasal spray, 2 sprays per nostril daily as needed. Proper nasal spray technique has been discussed and demonstrated.  I have also  recommended nasal saline spray (i.e., Simply Saline) or nasal saline  lavage (i.e., NeilMed) as needed and prior to medicated nasal sprays.  If allergen avoidance measures and medications fail to adequately relieve symptoms, aeroallergen immunotherapy will be considered.  Allergic conjunctivitis  Treatment plan as outlined above for allergic rhinitis.  A prescription has been provided for Patanol, one drop per eye twice daily as needed.  I have also recommended eye lubricant drops (i.e., Natural Tears) as needed.  Food allergy The patient's history suggests peanut and tree nut allergy and positive skin test results today confirm this diagnosis.  Meticulous avoidance of peanuts and tree nuts as discussed.  A prescription has been provided for epinephrine auto-injector 2 pack along with instructions for proper administration.  A food allergy action plan has been provided and discussed.  Medic Alert identification is recommended.  Dyspnea Dyspnea with exertion.  The absence of coughing, chest tightness, wheezing, and normal spirometry wall symptomatic makes bronchial hyperresponsiveness very unlikely.   Meds ordered this encounter  Medications  . levocetirizine (XYZAL) 5 MG tablet    Sig: Take 1 tablet (5 mg total) by mouth every evening.    Dispense:  30 tablet    Refill:  5  . fluticasone (FLONASE) 50 MCG/ACT nasal spray    Sig: Place 2 sprays into both nostrils daily as needed for allergies or rhinitis.    Dispense:  18.2 g    Refill:  5  . olopatadine (PATANOL) 0.1 % ophthalmic solution    Sig: Place 1 drop into both eyes 2 (two) times daily as needed for allergies.    Dispense:  5 mL    Refill:  5  . EPINEPHrine 0.3 mg/0.3 mL IJ SOAJ injection    Sig: Inject 0.3 mLs (0.3 mg total) into the muscle once.    Dispense:  2 Device    Refill:  1    Diagnostics: Spirometry: Spirometry:  Normal with an FEV1 of 107% predicted.  This study was performed while the patient was  experiencing mild dyspnea.  Please see scanned spirometry results for details. Epicutaneous testing: Robust positive to grass pollens, weed pollens, ragweed pollen, and tree pollens. Intradermal testing: Positive to cat hair. Food allergen skin testing: Positive to peanut, almond, hazelnut, and [place] nut.    Physical examination: Blood pressure 132/90, pulse 70, temperature 98.4 F (36.9 C), temperature source Oral, resp. rate 16, height 5\' 4"  (1.626 m), weight 228 lb (103.4 kg), SpO2 98 %.  General: Alert, interactive, in no acute distress. HEENT: TMs pearly gray, turbinates edematous and pale with thick discharge, post-pharynx moderately erythematous. Neck: Supple without lymphadenopathy. Lungs: Clear to auscultation without wheezing, rhonchi or rales. CV: Normal S1, S2 without murmurs. Abdomen: Nondistended, nontender. Skin: Warm and dry, without lesions or rashes. Extremities:  No clubbing, cyanosis or edema. Neuro:   Grossly intact.  Review of systems:  Review of systems negative except as noted in HPI / PMHx or noted below: Review of Systems  Constitutional: Negative.   HENT: Negative.   Eyes: Negative.   Respiratory: Negative.   Cardiovascular: Negative.   Gastrointestinal: Negative.   Genitourinary: Negative.   Musculoskeletal: Negative.   Skin: Negative.   Neurological: Negative.   Endo/Heme/Allergies: Negative.   Psychiatric/Behavioral: Negative.     Past medical history:  Past Medical History:  Diagnosis Date  . Allergy   . Anemia   . Endometriosis   . History of UTI   . Hypertension    pregnancy induced HTN/pre-eclampsia //  on meds since 2nd child (2000)    Past surgical  history:  Past Surgical History:  Procedure Laterality Date  . ABDOMINAL HYSTERECTOMY  2003   partial  . CARPAL TUNNEL RELEASE     right  . GANGLION CYST EXCISION     right  . Hammer Toe Repair Bilateral 12/29/2013   Lt #3, #4, Rt #2.    Family history: Family History    Problem Relation Age of Onset  . Allergies Daughter   . Allergic rhinitis Daughter   . Allergies Son   . Allergic rhinitis Son   . Leukemia Paternal Grandmother   . Arthritis Mother   . Hyperlipidemia Mother   . Hypertension Mother   . Stroke Mother   . Allergic rhinitis Mother   . Hypertension Father   . Diabetes Father   . Arthritis Maternal Grandmother   . Stroke Maternal Grandmother 50  . Hypertension Maternal Grandmother   . Heart attack Maternal Grandmother 50  . Coronary artery disease Maternal Aunt 50  . Coronary artery disease Maternal Aunt 50  . Angioedema Neg Hx   . Asthma Neg Hx   . Eczema Neg Hx   . Immunodeficiency Neg Hx   . Urticaria Neg Hx     Social history: Social History   Social History  . Marital status: Married    Spouse name: N/A  . Number of children: 2  . Years of education: N/A   Occupational History  .  [employer] History Main Topics  . Smoking status: Never Smoker  . Smokeless tobacco: Never Used  . Alcohol use No  . Drug use: No  . Sexual activity: Not on file   Other Topics Concern  . Not on file   Social History Narrative   Regular exercise: yes.   Married.  Lives with one child.  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           Environmental History: The patient lives in a 24-year-old house with carpeting throughout, gas heat, and central air.  There is no known mold/water damage in the home.  There are no pets in the home.  She is a nonsmoker.  Allergies as of 06/26/2016      Reactions   Amoxicillin    rash   Ace Inhibitors    REACTION: lip swelling      Medication List       Accurate as of 06/26/16  1:48 PM. Always use your most recent med list.          ALPRAZolam 0.25 MG tablet Commonly known as:  XANAX Take 1 tablet (0.25 mg total) by mouth 3 (three) times daily as needed. for anxiety   BELVIQ 10 MG Tabs Generic drug:  Lorcaserin  HCl TAKE ONE TABLET BY MOUTH TWICE DAILY   cloNIDine 0.1 MG tablet Commonly known as:  CATAPRES Take 1 tablet (0.1 mg total) by mouth 2 (two) times daily.   EPINEPHrine 0.3 mg/0.3 mL Soaj injection Commonly known as:  EPI-PEN Inject 0.3 mLs (0.3 mg total) into the muscle once.   fluticasone 50 MCG/ACT nasal spray Commonly known as:  FLONASE Place 2 sprays into both nostrils daily as needed for allergies or rhinitis.   furosemide 20 MG tablet Commonly known as:  LASIX TAKE ONE TABLET BY MOUTH ONCE DAILY AS NEEDED   levocetirizine 5 MG tablet Commonly known as:  XYZAL Take 1 tablet (5 mg total) by mouth every evening.   metoprolol succinate 100  MG 24 hr tablet Commonly known as:  TOPROL XL Take 1 tablet (100 mg total) by mouth daily. Take with or immediately following a meal.   multivitamin tablet Take 1 tablet by mouth daily.   olopatadine 0.1 % ophthalmic solution Commonly known as:  PATANOL Place 1 drop into both eyes 2 (two) times daily as needed for allergies.   spironolactone 25 MG tablet Commonly known as:  ALDACTONE Take 1 tablet (25 mg total) by mouth daily.   venlafaxine XR 150 MG 24 hr capsule Commonly known as:  EFFEXOR-XR TAKE 1 CAPSULE DAILY WITH BREAKFAST       Known medication allergies: Allergies  Allergen Reactions  . Amoxicillin     rash  . Ace Inhibitors     REACTION: lip swelling    I appreciate the opportunity to take part in Ladaja's care. Please do not hesitate to contact me with questions.  Sincerely,   R. Edgar Frisk, MD

## 2016-06-26 NOTE — Assessment & Plan Note (Signed)
   Treatment plan as outlined above for allergic rhinitis.  A prescription has been provided for Patanol, one drop per eye twice daily as needed.  I have also recommended eye lubricant drops (i.e., Natural Tears) as needed. 

## 2016-06-26 NOTE — Assessment & Plan Note (Signed)
The patient's history suggests peanut and tree nut allergy and positive skin test results today confirm this diagnosis.  Meticulous avoidance of peanuts and tree nuts as discussed.  A prescription has been provided for epinephrine auto-injector 2 pack along with instructions for proper administration.  A food allergy action plan has been provided and discussed.  Medic Alert identification is recommended.

## 2016-06-26 NOTE — Patient Instructions (Addendum)
Angioedema Unclear etiology.  Pitting edema, previous response to furosemide, and dyspnea with exertion points to a cardiac etiology, though cardiovascular evaluation has been negative to date. Food allergen tests were reactive to peanut and tree nuts, however the swelling does not seem to correlate with the consumption of nuts. The patient is not taking an ACE inhibitor.  Nicole Quinn had urticaria on at least one occasion in 2013/2014, though not recently. Will order labs to rule out hereditary angioedema, acquired angioedema, urticaria associated angioedema, and other potential etiologies. For symptom relief, patient is to take oral antihistamines as directed. However, if the underlying pathophysiology is bradykinin mediated, antihistamines will not reduce symptoms.  The following labs have been ordered: TSH, antithyroglobulin antibody, thyroid peroxidase antibody, FCeRI antibody, tryptase, C4, C1 esterase inhibitor (quantitative and functional), C1q, factor XII, and galactose-alpha-1,3-galactose IgE level.  The patient will be notified with further recommendations after lab results have returned.  Should significant symptoms recur or new symptoms occure, a  journal is to be kept recording any foods eaten, beverages consumed, medications taken, activities performed, and environmental conditions within a 6 hour period prior to the onset of symptoms. For any symptoms concerning for anaphylaxis, epinephrine is to be administered and 911 is to be called immediately.  Seasonal and perennial allergic rhinitis  Aeroallergen avoidance measures have been discussed and provided in written form.  A prescription has been provided for levocetirizine, 5mg  daily as needed.  A prescription has been provided for fluticasone nasal spray, 2 sprays per nostril daily as needed. Proper nasal spray technique has been discussed and demonstrated.  I have also recommended nasal saline spray (i.e., Simply Saline) or nasal  saline lavage (i.e., NeilMed) as needed and prior to medicated nasal sprays.  If allergen avoidance measures and medications fail to adequately relieve symptoms, aeroallergen immunotherapy will be considered.  Allergic conjunctivitis  Treatment plan as outlined above for allergic rhinitis.  A prescription has been provided for Patanol, one drop per eye twice daily as needed.  I have also recommended eye lubricant drops (i.e., Natural Tears) as needed.  Food allergy The patient's history suggests peanut and tree nut allergy and positive skin test results today confirm this diagnosis.  Meticulous avoidance of peanuts and tree nuts as discussed.  A prescription has been provided for epinephrine auto-injector 2 pack along with instructions for proper administration.  A food allergy action plan has been provided and discussed.  Medic Alert identification is recommended.  Dyspnea Dyspnea with exertion.  The absence of coughing, chest tightness, wheezing, and normal spirometry wall symptomatic makes bronchial hyperresponsiveness very unlikely.  When lab results have returned the patient will be called with further recommendations and follow up instructions.  Reducing Pollen Exposure  The American Academy of Allergy, Asthma and Immunology suggests the following steps to reduce your exposure to pollen during allergy seasons.    1. Do not hang sheets or clothing out to dry; pollen may collect on these items. 2. Do not mow lawns or spend time around freshly cut grass; mowing stirs up pollen. 3. Keep windows closed at night.  Keep car windows closed while driving. 4. Minimize morning activities outdoors, a time when pollen counts are usually at their highest. 5. Stay indoors as much as possible when pollen counts or humidity is high and on windy days when pollen tends to remain in the air longer. 6. Use air conditioning when possible.  Many air conditioners have filters that trap the pollen  spores. 7. Use a HEPA room  air filter to remove pollen form the indoor air you breathe.   Control of Dog or Cat Allergen  Avoidance is the best way to manage a dog or cat allergy. If you have a dog or cat and are allergic to dog or cats, consider removing the dog or cat from the home. If you have a dog or cat but don't want to find it a new home, or if your family wants a pet even though someone in the household is allergic, here are some strategies that may help keep symptoms at bay:  1. Keep the pet out of your bedroom and restrict it to only a few rooms. Be advised that keeping the dog or cat in only one room will not limit the allergens to that room. 2. Don't pet, hug or kiss the dog or cat; if you do, wash your hands with soap and water. 3. High-efficiency particulate air (HEPA) cleaners run continuously in a bedroom or living room can reduce allergen levels over time. 4. Regular use of a high-efficiency vacuum cleaner or a central vacuum can reduce allergen levels. 5. Giving your dog or cat a bath at least once a week can reduce airborne allergen.

## 2016-06-27 ENCOUNTER — Telehealth: Payer: Self-pay | Admitting: *Deleted

## 2016-06-27 ENCOUNTER — Encounter: Payer: Self-pay | Admitting: Physician Assistant

## 2016-06-27 ENCOUNTER — Ambulatory Visit (HOSPITAL_COMMUNITY): Payer: PRIVATE HEALTH INSURANCE | Attending: Cardiovascular Disease

## 2016-06-27 ENCOUNTER — Other Ambulatory Visit: Payer: Self-pay

## 2016-06-27 ENCOUNTER — Ambulatory Visit (HOSPITAL_BASED_OUTPATIENT_CLINIC_OR_DEPARTMENT_OTHER): Payer: PRIVATE HEALTH INSURANCE

## 2016-06-27 DIAGNOSIS — R0602 Shortness of breath: Secondary | ICD-10-CM | POA: Insufficient documentation

## 2016-06-27 DIAGNOSIS — R0789 Other chest pain: Secondary | ICD-10-CM

## 2016-06-27 LAB — MYOCARDIAL PERFUSION IMAGING
Estimated workload: 13.1 METS
Exercise duration (min): 11 min
Exercise duration (sec): 0 s
LV dias vol: 120 mL (ref 46–106)
LV sys vol: 48 mL
MPHR: 181 {beats}/min
Peak HR: 166 {beats}/min
Percent HR: 91 %
RATE: 0.27
Rest HR: 60 {beats}/min
SDS: 0
SRS: 1
SSS: 1
TID: 0.91

## 2016-06-27 MED ORDER — TECHNETIUM TC 99M TETROFOSMIN IV KIT
32.2000 | PACK | Freq: Once | INTRAVENOUS | Status: AC | PRN
Start: 2016-06-27 — End: 2016-06-27
  Administered 2016-06-27: 32.2 via INTRAVENOUS
  Filled 2016-06-27: qty 33

## 2016-06-27 MED ORDER — TECHNETIUM TC 99M TETROFOSMIN IV KIT
10.2000 | PACK | Freq: Once | INTRAVENOUS | Status: AC | PRN
  Administered 2016-06-27: 10.2 via INTRAVENOUS
  Filled 2016-06-27: qty 11

## 2016-06-27 NOTE — Telephone Encounter (Signed)
-----   Message from Liliane Shi, [place] sent at 06/27/2016  4:32 PM EDT ----- Please call the patient. The echocardiogram is normal.  Please fax a copy of this study result to her PCP:  Debbrah Alar, NP  Thanks! Richardson Dopp, PA-C    06/27/2016 4:31 PM

## 2016-06-27 NOTE — Telephone Encounter (Signed)
Pt has been notified of both myoview and echo results by phone with verbal understanding. Pt thanked me for my call. Pt agreeable to continue on current Tx plan.

## 2016-06-30 ENCOUNTER — Encounter: Payer: Self-pay | Admitting: Physician Assistant

## 2016-07-03 LAB — C4 COMPLEMENT: C4 Complement: 40 mg/dL (ref 15–57)

## 2016-07-04 LAB — TRYPTASE: Tryptase: 5.5 ug/L (ref <11)

## 2016-07-05 LAB — FACTOR 12 ASSAY: Factor XII Activity: 95 % (ref 50–150)

## 2016-07-06 LAB — C1 ESTERASE INHIBITOR, FUNCTIONAL: C1INH Functional/C1INH Total MFr SerPl: >100 % (ref >=68)

## 2016-07-07 LAB — ALPHA-GAL PANEL
Beef IgE: <0.1 kU/L (ref <0.35)
Class: 0
Class: 0
Class: 0
Galactose-alpha-1,3-galactose IgE*: <0.1 kU/L (ref <0.35)
Lamb/Mutton IgE: <0.1 kU/L (ref <0.35)
Pork IgE: <0.1 kU/L (ref <0.35)

## 2016-07-07 LAB — C1 ESTERASE INHIBITOR: C1INH SerPl-mCnc: 31 mg/dL (ref 21–39)

## 2016-07-08 LAB — COMPLEMENT COMPONENT C1Q: Complement C1Q: 6.3 mg/dL (ref 5.0–8.6)

## 2016-07-09 ENCOUNTER — Encounter: Payer: Self-pay | Admitting: Cardiology

## 2016-07-09 LAB — CP CHRONIC URTICARIA INDEX PANEL
Histamine Release: <16 % (ref <16)
TSH: 1.05 mIU/L
Thyroglobulin Ab: <1 IU/mL (ref <2)
Thyroperoxidase Ab SerPl-aCnc: 1 IU/mL (ref <9)

## 2016-07-13 ENCOUNTER — Encounter: Payer: Self-pay | Admitting: Allergy and Immunology

## 2016-07-18 ENCOUNTER — Encounter: Payer: Self-pay | Admitting: Cardiology

## 2016-07-18 ENCOUNTER — Encounter: Payer: Self-pay | Admitting: Family

## 2016-07-21 MED ORDER — HYDROCHLOROTHIAZIDE 25 MG PO TABS: 25.0000 mg | ORAL_TABLET | Freq: Every day | ORAL | 3 refills | Status: DC

## 2016-07-21 MED ORDER — POTASSIUM CHLORIDE CRYS ER 20 MEQ PO TBCR: 20.0000 meq | EXTENDED_RELEASE_TABLET | Freq: Every day | ORAL | 3 refills | Status: DC

## 2016-07-21 NOTE — Telephone Encounter (Signed)
Please let pt know that I am willing to give her a try back on the hctz, but we will need for her to take a potassium supplement and monitor her closely for potassium levels.  D/c spironolactone, start hctz, start [place], follow up with me in 1 week.

## 2016-07-21 NOTE — Telephone Encounter (Signed)
Attempted to reach pt and received voicemail. Did not leave message but sent response via mychart.

## 2016-07-24 NOTE — Progress Notes (Deleted)
Cardiology Office Note   Date:  07/24/2016   ID:  [name], DOB August 22, 1975, MRN [id number]  PCP:  Debbrah Alar, NP  Cardiologist:   Minus Breeding, MD  Referring:  ***  No chief complaint on file.     History of Present Illness: Nicole Quinn is a 41 y.o. female who presents for fatigue, dyspnea, chest pain and edema .  I saw her in 2013 for dyspnea.  CPX was normal.  2016 echo was normal.  She saw Richardson Dopp last month for edema of the legs. She did see [facility] and an ultrasound was neg for DVT.  She was asked to wear compression stockings for venous insufficiency and to follow up with her PCP.  A BNP this month was normal.  ANA, RF were also neg.  A ESR was 21 but a follow up was 10.  TSH has been normal.  Albumin has been normal.  Work up after the last visit included a negative Lexiscan Myoview.  Echo was normal.   ***   She usually exercises 3-4 times a week.  But, recently has been fatigued and short of breath with activity. She has decreased her activity somewhat in response to this.  She has gained about 30 lbs in the last few months.  She denies orthopnea, PND.  She denies syncope.  She does not chest pressure that is fairly constant and seems to get worse with activity.  She has been nauseated at times and diaphoretic.  She denies any chest pain radiating to her jaw or arms.  Overall, her symptoms seem to be worsening.   Past Medical History:  Diagnosis Date  . Allergy   . Anemia   . Endometriosis   . History of echocardiogram    Echo 6/18: EF 60-65, no RWMA, normal diastolic function  . History of nuclear stress test    Myoview 6/18: EF 60, normal perfusion, Low Risk  . History of UTI   . Hypertension    pregnancy induced HTN/pre-eclampsia //  on meds since 2nd child (2000)    Past Surgical History:  Procedure Laterality Date  . ABDOMINAL HYSTERECTOMY  2003   partial  . CARPAL TUNNEL RELEASE     right  . GANGLION CYST EXCISION     right  . Hammer Toe  Repair Bilateral 12/29/2013   Lt #3, #4, Rt #2.     Current Outpatient Prescriptions  Medication Sig Dispense Refill  . cloNIDine (CATAPRES) 0.1 MG tablet Take 1 tablet (0.1 mg total) by mouth 2 (two) times daily. 28 tablet 0  . fluticasone (FLONASE) 50 MCG/ACT nasal spray Place 2 sprays into both nostrils daily as needed for allergies or rhinitis. 18.2 g 5  . furosemide (LASIX) 20 MG tablet TAKE ONE TABLET BY MOUTH ONCE DAILY AS NEEDED 90 tablet 1  . hydrochlorothiazide (HYDRODIURIL) 25 MG tablet Take 1 tablet (25 mg total) by mouth daily. 30 tablet 3  . levocetirizine (XYZAL) 5 MG tablet Take 1 tablet (5 mg total) by mouth every evening. 30 tablet 5  . metoprolol succinate (TOPROL XL) 100 MG 24 hr tablet Take 1 tablet (100 mg total) by mouth daily. Take with or immediately following a meal. 30 tablet 2  . Multiple Vitamin (MULTIVITAMIN) tablet Take 1 tablet by mouth daily.      Marland Kitchen olopatadine (PATANOL) 0.1 % ophthalmic solution Place 1 drop into both eyes 2 (two) times daily as needed for allergies. 5 mL 5  . potassium chloride  SA (K-DUR,KLOR-CON) 20 MEQ tablet Take 1 tablet (20 mEq total) by mouth daily. 30 tablet 3  . venlafaxine XR (EFFEXOR-XR) 150 MG 24 hr capsule TAKE 1 CAPSULE DAILY WITH BREAKFAST (Patient not taking: Reported on 06/26/2016) 90 capsule 0   No current facility-administered medications for this visit.     Allergies:   Amoxicillin and Ace inhibitors     ROS:  Please see the history of present illness.   Otherwise, review of systems are positive for {NONE DEFAULTED:18576::"none"}.   All other systems are reviewed and negative.    PHYSICAL EXAM: VS:  There were no vitals taken for this visit. , BMI There is no height or weight on file to calculate BMI. GENERAL:  Well appearing HEENT:  Pupils equal round and reactive, fundi not visualized, oral mucosa unremarkable NECK:  No jugular venous distention, waveform within normal limits, carotid upstroke brisk and symmetric,  no bruits, no thyromegaly LYMPHATICS:  No cervical, inguinal adenopathy LUNGS:  Clear to auscultation bilaterally BACK:  No CVA tenderness CHEST:  Unremarkable HEART:  PMI not displaced or sustained,S1 and S2 within normal limits, no S3, no S4, no clicks, no rubs, *** murmurs ABD:  Flat, positive bowel sounds normal in frequency in pitch, no bruits, no rebound, no guarding, no midline pulsatile mass, no hepatomegaly, no splenomegaly EXT:  2 plus pulses throughout, no edema, no cyanosis no clubbing SKIN:  No rashes no nodules NEURO:  Cranial nerves II through XII grossly intact, motor grossly intact throughout PSYCH:  Cognitively intact, oriented to person place and time    EKG:  EKG {ACTION; IS/IS VKP:[id number]} ordered today. The ekg ordered today demonstrates ***   Recent Labs: 06/05/2016: ALT 17; B Natriuretic Peptide 29.0; BUN 9; Creatinine, Ser 0.69; Hemoglobin 11.9; Platelets 299; Potassium 3.4; Sodium 138 07/02/2016: TSH 1.05    Lipid Panel    Component Value Date/Time   CHOL 222 (H) 01/29/2016 0717   TRIG 49.0 01/29/2016 0717   HDL 62.90 01/29/2016 0717   CHOLHDL 4 01/29/2016 0717   VLDL 9.8 01/29/2016 0717   LDLCALC 149 (H) 01/29/2016 0717      Wt Readings from Last 3 Encounters:  06/26/16 228 lb (103.4 kg)  06/18/16 227 lb 1.9 oz (103 kg)  06/05/16 233 lb (105.7 kg)      Other studies Reviewed: Additional studies/ records that were reviewed today include:   Labs, echo, [employer]. Review of the above records demonstrates:  Please see elsewhere in the note.  ***   ASSESSMENT AND PLAN:  CHEST PAIN:  ***  SOB:  ***  EDEMA:  ***   Rheum follow up was planned.  ***  SNORING:    She was to have a sleep study.  ***  HTN:  ***   Current medicines are reviewed at length with the patient today.  The patient {ACTIONS; HAS/DOES NOT HAVE:19233} concerns regarding medicines.  The following changes have been made:  {PLAN; NO CHANGE:13088:s}  Labs/ tests  ordered today include: *** No orders of the defined types were placed in this encounter.    Disposition:   FU with ***    Signed, Minus Breeding, MD  07/24/2016 7:36 AM    [place]

## 2016-07-25 ENCOUNTER — Ambulatory Visit: Payer: PRIVATE HEALTH INSURANCE | Admitting: Cardiology

## 2016-07-28 ENCOUNTER — Ambulatory Visit (INDEPENDENT_AMBULATORY_CARE_PROVIDER_SITE_OTHER): Payer: PRIVATE HEALTH INSURANCE | Admitting: Family

## 2016-07-28 ENCOUNTER — Encounter: Payer: Self-pay | Admitting: Family

## 2016-07-28 VITALS — BP 119/80 | HR 70 | Temp 99.2°F | Resp 16 | Ht 65.0 in | Wt 227.2 lb

## 2016-07-28 DIAGNOSIS — I10 Essential (primary) hypertension: Secondary | ICD-10-CM | POA: Diagnosis not present

## 2016-07-28 DIAGNOSIS — E876 Hypokalemia: Secondary | ICD-10-CM | POA: Diagnosis not present

## 2016-07-28 NOTE — Progress Notes (Signed)
Subjective:    Patient ID: Nicole Quinn, female    DOB: 10-24-1975, 41 y.o.   MRN: [id number]  HPI  Nicole Quinn is a 41 yr old female who presents today for follow up of hypertension.  She reported improvement in her LE edema on hctz and requested to return to the HCTZ. (instead of aldactone). She reports that since changing back to hydrochlorothiazide her lower extremity edema has improved. She reports having more good days than bad at this point. She denies chest pain or shortness of breath. Of note her cardiologist has ordered a sleep study for her which will be performed tomorrow night.   She denies CP or SOB. Still has days when she swells.  Having more good days than bad days.  BP Readings from Last 3 Encounters:  07/28/16 119/80  06/26/16 132/90  06/18/16 116/90     Review of Systems See hpi  Past Medical History:  Diagnosis Date  . Allergy   . Anemia   . Endometriosis   . History of echocardiogram    Echo 6/18: EF 60-65, no RWMA, normal diastolic function  . History of nuclear stress test    Myoview 6/18: EF 60, normal perfusion, Low Risk  . History of UTI   . Hypertension    pregnancy induced HTN/pre-eclampsia //  on meds since 2nd child (2000)     Social History   Social History  . Marital status: Married    Spouse name: N/A  . Number of children: 2  . Years of education: N/A   Occupational History  .  [employer] History Main Topics  . Smoking status: Never Smoker  . Smokeless tobacco: Never Used  . Alcohol use No  . Drug use: No  . Sexual activity: Not on file   Other Topics Concern  . Not on file   Social History Narrative   Regular exercise: yes.   Married.  Lives with one child.  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            Past Surgical History:  Procedure Laterality Date  . ABDOMINAL HYSTERECTOMY  2003   partial  . CARPAL TUNNEL RELEASE     right  .  GANGLION CYST EXCISION     right  . Hammer Toe Repair Bilateral 12/29/2013   Lt #3, #4, Rt #2.    Family History  Problem Relation Age of Onset  . Allergies Daughter   . Allergic rhinitis Daughter   . Allergies Son   . Allergic rhinitis Son   . Leukemia Paternal Grandmother   . Arthritis Mother   . Hyperlipidemia Mother   . Hypertension Mother   . Stroke Mother   . Allergic rhinitis Mother   . Hypertension Father   . Diabetes Father   . Arthritis Maternal Grandmother   . Stroke Maternal Grandmother 50  . Hypertension Maternal Grandmother   . Heart attack Maternal Grandmother 50  . Coronary artery disease Maternal Aunt 50  . Coronary artery disease Maternal Aunt 50  . Angioedema Neg Hx   . Asthma Neg Hx   . Eczema Neg Hx   . Immunodeficiency Neg Hx   . Urticaria Neg Hx     Allergies  Allergen Reactions  . Amoxicillin     rash  . Ace Inhibitors     REACTION: lip swelling    Current Outpatient Prescriptions on File Prior to Visit  Medication Sig Dispense Refill  . cloNIDine (CATAPRES) 0.1 MG tablet Take 1 tablet (0.1 mg total) by mouth 2 (two) times daily. 28 tablet 0  . fluticasone (FLONASE) 50 MCG/ACT nasal spray Place 2 sprays into both nostrils daily as needed for allergies or rhinitis. 18.2 g 5  . furosemide (LASIX) 20 MG tablet TAKE ONE TABLET BY MOUTH ONCE DAILY AS NEEDED 90 tablet 1  . hydrochlorothiazide (HYDRODIURIL) 25 MG tablet Take 1 tablet (25 mg total) by mouth daily. 30 tablet 3  . levocetirizine (XYZAL) 5 MG tablet Take 1 tablet (5 mg total) by mouth every evening. 30 tablet 5  . metoprolol succinate (TOPROL XL) 100 MG 24 hr tablet Take 1 tablet (100 mg total) by mouth daily. Take with or immediately following a meal. 30 tablet 2  . Multiple Vitamin (MULTIVITAMIN) tablet Take 1 tablet by mouth daily.      Marland Kitchen olopatadine (PATANOL) 0.1 % ophthalmic solution Place 1 drop into both eyes 2 (two) times daily as needed for allergies. 5 mL 5  . potassium  chloride SA (K-DUR,KLOR-CON) 20 MEQ tablet Take 1 tablet (20 mEq total) by mouth daily. 30 tablet 3  . venlafaxine XR (EFFEXOR-XR) 150 MG 24 hr capsule TAKE 1 CAPSULE DAILY WITH BREAKFAST 90 capsule 0   No current facility-administered medications on file prior to visit.     BP 119/80 (BP Location: Left Arm, Cuff Size: Large)   Pulse 70   Temp 99.2 F (37.3 C) (Oral)   Resp 16   Ht 5\' 5"  (1.651 m)   Wt 227 lb 3.2 oz (103.1 kg)   SpO2 99%   BMI 37.81 kg/m       Objective:   Physical Exam  Constitutional: She is oriented to person, place, and time. She appears well-developed and well-nourished.  Cardiovascular: Normal rate, regular rhythm and normal heart sounds.   No murmur heard. Pulmonary/Chest: Effort normal and breath sounds normal. No respiratory distress. She has no wheezes.  Musculoskeletal: She exhibits no edema.  Neurological: She is alert and oriented to person, place, and time.  Psychiatric: She has a normal mood and affect. Her behavior is normal. Judgment and thought content normal.          Assessment & Plan:  hypertension-blood pressure is currently stable on current meds. She notes improvement in her lower extremity edema. She reports good compliance with the potassium supplement. Will check a follow-up basic metabolic panel today. She will ned close monitoring of her potassium due to previous history of hypokalemia on the HCTZ.

## 2016-07-28 NOTE — Patient Instructions (Signed)
Please complete lab work prior to leaving.   

## 2016-07-29 ENCOUNTER — Ambulatory Visit (HOSPITAL_BASED_OUTPATIENT_CLINIC_OR_DEPARTMENT_OTHER): Payer: PRIVATE HEALTH INSURANCE | Attending: Physician Assistant | Admitting: Cardiology

## 2016-07-29 VITALS — Ht 64.0 in | Wt 227.0 lb

## 2016-07-29 DIAGNOSIS — E669 Obesity, unspecified: Secondary | ICD-10-CM

## 2016-07-29 DIAGNOSIS — E66811 Obesity, class 1: Secondary | ICD-10-CM

## 2016-07-29 DIAGNOSIS — R0683 Snoring: Secondary | ICD-10-CM

## 2016-07-29 DIAGNOSIS — G4733 Obstructive sleep apnea (adult) (pediatric): Secondary | ICD-10-CM

## 2016-07-29 LAB — BASIC METABOLIC PANEL
BUN: 13 mg/dL (ref 6–23)
CO2: 31 mEq/L (ref 19–32)
Calcium: 9.9 mg/dL (ref 8.4–10.5)
Chloride: 94 mEq/L — ABNORMAL LOW (ref 96–112)
Creatinine, Ser: 0.87 mg/dL (ref 0.40–1.20)
GFR: 93.03 mL/min (ref >60.00)
Glucose, Bld: 81 mg/dL (ref 70–99)
Potassium: 3.1 mEq/L — ABNORMAL LOW (ref 3.5–5.1)
Sodium: 135 mEq/L (ref 135–145)

## 2016-07-30 ENCOUNTER — Telehealth: Payer: Self-pay | Admitting: Family

## 2016-07-30 DIAGNOSIS — E876 Hypokalemia: Secondary | ICD-10-CM

## 2016-07-30 MED ORDER — POTASSIUM CHLORIDE CRYS ER 20 MEQ PO TBCR: 20.0000 meq | EXTENDED_RELEASE_TABLET | Freq: Two times a day (BID) | ORAL | 3 refills | Status: DC

## 2016-07-30 NOTE — Telephone Encounter (Signed)
Potassium is low.  Take 2 tabs of Kdur now and 1 tab tonight. Then continue one tab twice daily. Repeat bmet in 1 week.

## 2016-07-30 NOTE — Telephone Encounter (Signed)
Sent mychart message to pt. 

## 2016-08-01 NOTE — Procedures (Signed)
   Patient Name: Nicole Quinn, Nicole Quinn Study Date: 07/29/2016 Gender: Female D.O.B: May 23, 1975 Age (years): 41 Referring Provider: Richardson Dopp Height (inches): 92 Interpreting Physician: Fransico Him MD, ABSM Weight (lbs): 227 RPSGT: Jonna Coup BMI: 39 MRN: [id number] Neck Size: 14.00  CLINICAL INFORMATION Sleep Study Type: NPSG  Indication for sleep study: Excessive Daytime Sleepiness, Fatigue, Hypertension, Obesity, Snoring  Epworth Sleepiness Score: 12  SLEEP STUDY TECHNIQUE As per the AASM Manual for the Scoring of Sleep and Associated Events v2.3 (April 2016) with a hypopnea requiring 4% desaturations.  The channels recorded and monitored were frontal, central and occipital EEG, electrooculogram (EOG), submentalis EMG (chin), nasal and oral airflow, thoracic and abdominal wall motion, anterior tibialis EMG, snore microphone, electrocardiogram, and pulse oximetry.  MEDICATIONS Medications self-administered by patient taken the night of the study : N/A  SLEEP ARCHITECTURE The study was initiated at 9:39:44 PM and ended at 4:30:16 AM.  Sleep onset time was 46.0 minutes and the sleep efficiency was 75.3%. The total sleep time was 309.0 minutes.  Stage REM latency was 180.5 minutes.  The patient spent 3.07% of the night in stage N1 sleep, 78.48% in stage N2 sleep, 8.09% in stage N3 and 10.36% in REM.  Alpha intrusion was absent.  Supine sleep was 38.97%.  RESPIRATORY PARAMETERS The overall apnea/hypopnea index (AHI) was 0.0 per hour. There were 0 total apneas, including 0 obstructive, 0 central and 0 mixed apneas. There were 0 hypopneas and 0 RERAs.  The AHI during Stage REM sleep was 0.0 per hour.  AHI while supine was 0.0 per hour.  The mean oxygen saturation was 95.65%. The minimum SpO2 during sleep was 92.00%.  Soft snoring was noted during this study.  CARDIAC DATA The 2 lead EKG demonstrated sinus rhythm. The mean heart rate was 60.40 beats per minute.  Other EKG  findings include: PVCs.  LEG MOVEMENT DATA The total PLMS were 1 with a resulting PLMS index of 0.19. Associated arousal with leg movement index was 0.2 .  IMPRESSIONS - No significant obstructive sleep apnea occurred during this study (AHI = 0.0/h). - No significant central sleep apnea occurred during this study (CAI = 0.0/h). - The patient had minimal or no oxygen desaturation during the study (Min O2 = 92.00%) - The patient snored with Soft snoring volume. - EKG findings include PVCs. - Clinically significant periodic limb movements did not occur during sleep. No significant associated arousals.  DIAGNOSIS - Normal study  RECOMMENDATIONS - Avoid alcohol, sedatives and other CNS depressants that may worsen sleep apnea and disrupt normal sleep architecture. - Sleep hygiene should be reviewed to assess factors that may improve sleep quality. - Weight management and regular exercise should be initiated or continued if appropriate.  [place], American Board of Sleep Medicine  ELECTRONICALLY SIGNED ON:  08/01/2016, 3:58 PM [place] PH: [phone]   FX: [phone] [place]

## 2016-08-04 ENCOUNTER — Telehealth: Payer: Self-pay | Admitting: *Deleted

## 2016-08-04 NOTE — Progress Notes (Signed)
Office Visit Note  Patient: Nicole Quinn             Date of Birth: 1975-02-25           MRN: [id number]             PCP: Debbrah Alar, NP Referring: Debbrah Alar, NP Visit Date: 08/06/2016 Occupation: customer service    Subjective:  New Patient (Initial Visit) and Leg Swelling   History of Present Illness: Nicole Quinn is a 41 y.o. female seen in consultation per request of her PCP. According to patient her symptoms started one year ago with swelling in her bilateral lower extremities. At the time she was advised to wear compression hose. She states the swelling gradually got worse in the beginning of this year the swelling spread all over her body involving her face and upper extremities. She was seen by several physicians including allergist and the testing was negative. She also had evaluation by vascular surgeon in the ultrasound of the lower extremity was positive for some abnormal veins in her inguinal region. She was advised to wear compression hose. She also had cardiology evaluation with some stress test and sleep study which was negative. She states she was given Lasix but which did not help. Her swelling has improved to some extent. He denies any history of hives. She denies any joint pain or joint swelling. She does experience some pain in her lower extremities.  Activities of Daily Living:  Patient reports morning stiffness for 15 minutes.   Patient Denies nocturnal pain.  Difficulty dressing/grooming: Denies Difficulty climbing stairs: Denies Difficulty getting out of chair: Denies Difficulty using hands for taps, buttons, cutlery, and/or writing: Denies   Review of Systems  Constitutional: Positive for fatigue and weight gain. Negative for night sweats, weight loss and weakness.  HENT: Positive for mouth dryness. Negative for mouth sores, trouble swallowing, trouble swallowing and nose dryness.   Eyes: Positive for dryness. Negative for pain, redness  and visual disturbance.  Respiratory: Negative for cough, shortness of breath and difficulty breathing.   Cardiovascular: Negative for chest pain, palpitations, hypertension, irregular heartbeat and swelling in legs/feet.  Gastrointestinal: Positive for constipation. Negative for blood in stool and diarrhea.  Endocrine: Negative for increased urination.  Genitourinary: Negative for vaginal dryness.  Musculoskeletal: Positive for morning stiffness. Negative for arthralgias, joint pain, joint swelling, myalgias, muscle weakness, muscle tenderness and myalgias.  Skin: Negative for color change, rash, hair loss, skin tightness, ulcers and sensitivity to sunlight.  Allergic/Immunologic: Negative for susceptible to infections.  Neurological: Negative for dizziness, memory loss and night sweats.  Hematological: Negative for swollen glands.  Psychiatric/Behavioral: Negative for depressed mood and sleep disturbance. The patient is not nervous/anxious.     PMFS History:  Patient Active Problem List   Diagnosis Date Noted  . Angioedema 06/26/2016  . Seasonal and perennial allergic rhinitis 06/26/2016  . Allergic conjunctivitis 06/26/2016  . Food allergy 06/26/2016  . Allergic urticaria 06/26/2016  . Depression 01/29/2016  . Left wrist pain 11/29/2015  . Obesity (BMI 30.0-34.9) 08/12/2013  . Other and unspecified hyperlipidemia 02/24/2013  . Chronic constipation 02/22/2013  . Routine general medical examination at a health care facility 02/22/2013  . Anxiety associated with depression 10/10/2011  . Dyspnea 08/24/2011  . OTHER ACNE 06/04/2009  . HYPERGLYCEMIA, BORDERLINE 06/04/2009  . Essential hypertension 04/10/2009    Past Medical History:  Diagnosis Date  . Allergy   . Anemia   . Endometriosis   . History of echocardiogram  Echo 6/18: EF 60-65, no RWMA, normal diastolic function  . History of nuclear stress test    Myoview 6/18: EF 60, normal perfusion, Low Risk  . History of UTI    . Hypertension    pregnancy induced HTN/pre-eclampsia //  on meds since 2nd child (2000)    Family History  Problem Relation Age of Onset  . Allergies Daughter   . Allergic rhinitis Daughter   . Allergies Son   . Allergic rhinitis Son   . Leukemia Paternal Grandmother   . Arthritis Mother   . Hyperlipidemia Mother   . Hypertension Mother   . Stroke Mother   . Allergic rhinitis Mother   . Hypertension Father   . Diabetes Father   . Arthritis Maternal Grandmother   . Stroke Maternal Grandmother 50  . Hypertension Maternal Grandmother   . Heart attack Maternal Grandmother 50  . Coronary artery disease Maternal Aunt 50  . Coronary artery disease Maternal Aunt 50  . Angioedema Neg Hx   . Asthma Neg Hx   . Eczema Neg Hx   . Immunodeficiency Neg Hx   . Urticaria Neg Hx    Past Surgical History:  Procedure Laterality Date  . ABDOMINAL HYSTERECTOMY  2003   partial  . CARPAL TUNNEL RELEASE     right  . GANGLION CYST EXCISION     right  . Hammer Toe Repair Bilateral 12/29/2013   Lt #3, #4, Rt #2.   Social History   Social History Narrative   Regular exercise: yes.   Married.  Lives with one child.  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             Objective: Vital Signs: BP 138/72 (BP Location: Right Arm)   Pulse 72   Resp 14   Ht '5\' 4"'$  (1.626 m)   Wt 229 lb (103.9 kg)   BMI 39.31 kg/m    Physical Exam  Constitutional: She is oriented to person, place, and time. She appears well-developed and well-nourished.  HENT:  Head: Normocephalic and atraumatic.  Eyes: Conjunctivae and EOM are normal.  Neck: Normal range of motion.  Cardiovascular: Normal rate, regular rhythm, normal heart sounds and intact distal pulses.   Pulmonary/Chest: Effort normal and breath sounds normal.  Abdominal: Soft. Bowel sounds are normal.  Lymphadenopathy:    She has no cervical adenopathy.  Neurological: She is alert and oriented to person, place, and time.  Skin: Skin is warm  and dry. Capillary refill takes less than 2 seconds.  Psychiatric: She has a normal mood and affect. Her behavior is normal.  Nursing note and vitals reviewed.    Musculoskeletal Exam: C-spine and thoracic lumbar spine good range of motion. Shoulder joints elbow joints wrist joint MCPs PIPs DIPs with good range of motion. No synovitis was noted. Hip joints knee joints ankles MTPs PIPs DIPs with good range of motion with no synovitis.  CDAI Exam: No CDAI exam completed.    Investigation: Findings:  05/29/16 Sed Rate (ESR) 21, ANA normal, and Rheumatoid Factor normal   06/05/2016 UA negative, CBC hemoglobin 11.9, LFTs normal, TSH normal, histamine release negative, ANA negative, RF negative, ESR 10, C1q and C4 normal, C1 esterase inhibitor negative, 07/28/2016 BMP potassium low at 3.1  Imaging: No results found.  Speciality Comments: No specialty comments available.    Procedures:  No procedures performed Allergies: Amoxicillin and Ace inhibitors   Assessment / Plan:     Visit Diagnoses: Elevated sed rate  -  her sedimentation rate was elevated at 21 but the repeat sedimentation rate was normal at 10.  Lower extremity edema: She has no edema on on lower extremities today. According to patient and her husband she has intermittent swelling which comes and phases. She denies any arthralgias or myalgias which she feels sore all over when her edema gets worse. To complete the workup I'll obtain following labs. I discussed with patient that if her labs are normal and she may cancel her follow-up appointment.  Angioedema, subsequent encounter: All her allergy workup has been negative. She also had workup by vascular surgeon and cardiology which was negative.  Fatigue: This is also new for her.  History of hypertension: Her blood pressure is mildly elevated today.  History of anxiety and depression    Orders: Orders Placed This Encounter  Procedures  . VITAMIN D 25 Hydroxy (Vit-D  Deficiency, Fractures)  . [id number] ENA PANEL  . Angiotensin converting enzyme  . Cyclic Citrul Peptide Antibody, IGG  . 14-3-3 eta Protein  . Serum protein electrophoresis with reflex  . Pan-ANCA   No orders of the defined types were placed in this encounter.   Face-to-face time spent with patient was 40 minutes. 50% of time was spent in counseling and coordination of care.  Follow-Up Instructions: Return for Edema.   Bo Merino, MD  Note - This record has been created using [employer], [employer].  Chart creation errors have been sought, but may not always  have been located. Such creation errors do not reflect on  the standard of medical care.

## 2016-08-04 NOTE — Telephone Encounter (Signed)
Informed patient of sleep study results and patient understanding was verbalized. Patient understands that her sleep study showed no significant sleep apnea. Patient understands there are no other appointments needed at this time. Patient was grateful for the call and thanked me.

## 2016-08-04 NOTE — Telephone Encounter (Signed)
-----   Message from Liliane Shi, [place] sent at 08/04/2016  1:54 PM EDT -----   ----- Message ----- From: Liliane Shi, PA-C Sent: 08/01/2016   4:00 PM To: Liliane Shi, PA-C, Debbrah Alar, NP

## 2016-08-04 NOTE — Progress Notes (Signed)
Please call patient. Her sleep study is negative for obstructive sleep apnea. Continue with current treatment plan. Richardson Dopp, PA-C   08/04/2016 1:54 PM

## 2016-08-04 NOTE — Progress Notes (Signed)
Pt has been notified of sleep study results negative for OSA. Pt thanked me for the good news today.

## 2016-08-04 NOTE — Telephone Encounter (Signed)
-----   Message from Sueanne Margarita, MD sent at 08/01/2016  4:00 PM EDT ----- Please let patient know that sleep study showed no significant sleep apnea.

## 2016-08-04 NOTE — Telephone Encounter (Signed)
LMTCB

## 2016-08-04 NOTE — Telephone Encounter (Signed)
Pt has been notified of sleep study results negative for OSA. Pt thanked me for the good news today.

## 2016-08-06 ENCOUNTER — Encounter: Payer: Self-pay | Admitting: Rheumatology

## 2016-08-06 ENCOUNTER — Telehealth: Payer: Self-pay | Admitting: Cardiology

## 2016-08-06 ENCOUNTER — Telehealth: Payer: Self-pay | Admitting: *Deleted

## 2016-08-06 ENCOUNTER — Ambulatory Visit (INDEPENDENT_AMBULATORY_CARE_PROVIDER_SITE_OTHER): Payer: PRIVATE HEALTH INSURANCE | Admitting: Rheumatology

## 2016-08-06 VITALS — BP 138/72 | HR 72 | Resp 14 | Ht 64.0 in | Wt 229.0 lb

## 2016-08-06 DIAGNOSIS — Z8659 Personal history of other mental and behavioral disorders: Secondary | ICD-10-CM

## 2016-08-06 DIAGNOSIS — T783XXD Angioneurotic edema, subsequent encounter: Secondary | ICD-10-CM | POA: Diagnosis not present

## 2016-08-06 DIAGNOSIS — R700 Elevated erythrocyte sedimentation rate: Secondary | ICD-10-CM

## 2016-08-06 DIAGNOSIS — Z1321 Encounter for screening for nutritional disorder: Secondary | ICD-10-CM

## 2016-08-06 DIAGNOSIS — Z8679 Personal history of other diseases of the circulatory system: Secondary | ICD-10-CM | POA: Diagnosis not present

## 2016-08-06 DIAGNOSIS — R600 Localized edema: Secondary | ICD-10-CM

## 2016-08-06 DIAGNOSIS — R5383 Other fatigue: Secondary | ICD-10-CM

## 2016-08-06 NOTE — Telephone Encounter (Signed)
-----   Message from Minus Breeding, MD sent at 08/03/2016 10:23 AM EDT ----- Please make sure that the patient knows that she has no significant sleep apnea.    ----- Message ----- From: Sueanne Margarita, MD Sent: 08/01/2016   4:00 PM To: Minus Breeding, MD

## 2016-08-06 NOTE — Telephone Encounter (Signed)
Leave message for pt to call back 

## 2016-08-06 NOTE — Telephone Encounter (Signed)
Pt aware of her sleep study result

## 2016-08-06 NOTE — Telephone Encounter (Signed)
Pt aware of her sleep study

## 2016-08-06 NOTE — Telephone Encounter (Signed)
New message ° ° ° °Pt is returning call to nurse.  °

## 2016-08-07 ENCOUNTER — Other Ambulatory Visit: Payer: PRIVATE HEALTH INSURANCE

## 2016-08-07 ENCOUNTER — Other Ambulatory Visit: Payer: Self-pay | Admitting: Family

## 2016-08-07 LAB — CP5000020 ENA PANEL
ENA SM Ab Ser-aCnc: <1
Ribonucleic Protein(ENA) Antibody, IgG: <1
SSA (Ro) (ENA) Antibody, IgG: <1
SSB (La) (ENA) Antibody, IgG: <1
Scleroderma (Scl-70) (ENA) Antibody, IgG: <1
ds DNA Ab: 1 IU/mL

## 2016-08-07 LAB — ANGIOTENSIN CONVERTING ENZYME: Angiotensin-Converting Enzyme: 46 U/L (ref 9–67)

## 2016-08-07 LAB — PAN-ANCA
ANCA Screen: NEGATIVE
Myeloperoxidase Abs: <1
Serine Protease 3: <1

## 2016-08-07 LAB — VITAMIN D 25 HYDROXY (VIT D DEFICIENCY, FRACTURES): Vit D, 25-Hydroxy: 24 ng/mL — ABNORMAL LOW (ref 30–100)

## 2016-08-07 LAB — CYCLIC CITRUL PEPTIDE ANTIBODY, IGG: Cyclic Citrullin Peptide Ab: <16 Units

## 2016-08-11 LAB — 14-3-3 ETA PROTEIN: 14-3-3 eta Protein: <0.2 ng/mL (ref <0.2)

## 2016-08-14 ENCOUNTER — Encounter (INDEPENDENT_AMBULATORY_CARE_PROVIDER_SITE_OTHER): Payer: PRIVATE HEALTH INSURANCE | Admitting: Family Medicine

## 2016-08-16 ENCOUNTER — Other Ambulatory Visit: Payer: Self-pay | Admitting: Family

## 2016-08-18 ENCOUNTER — Encounter: Payer: Self-pay | Admitting: Rheumatology

## 2016-08-18 LAB — PROTEIN ELECTROPHORESIS, SERUM, WITH REFLEX

## 2016-08-18 NOTE — Progress Notes (Signed)
Vit D 50,000U Q wk #90d . Recheck labs in 3 months. SPEP?

## 2016-08-19 ENCOUNTER — Telehealth: Payer: Self-pay | Admitting: *Deleted

## 2016-08-19 DIAGNOSIS — M2550 Pain in unspecified joint: Secondary | ICD-10-CM

## 2016-08-19 DIAGNOSIS — E559 Vitamin D deficiency, unspecified: Secondary | ICD-10-CM

## 2016-08-19 MED ORDER — VITAMIN D (ERGOCALCIFEROL) 1.25 MG (50000 UNIT) PO CAPS: 50000.0000 [IU] | ORAL_CAPSULE | ORAL | 0 refills | Status: DC

## 2016-08-19 NOTE — Telephone Encounter (Signed)
-----   Message from Bo Merino, MD sent at 08/18/2016  2:23 PM EDT ----- Vit D 50,000U Q wk #90d . Recheck labs in 3 months. SPEP?

## 2016-08-20 ENCOUNTER — Other Ambulatory Visit: Payer: Self-pay

## 2016-08-20 DIAGNOSIS — E559 Vitamin D deficiency, unspecified: Secondary | ICD-10-CM

## 2016-08-20 DIAGNOSIS — M2550 Pain in unspecified joint: Secondary | ICD-10-CM

## 2016-08-21 LAB — VITAMIN D 25 HYDROXY (VIT D DEFICIENCY, FRACTURES): Vit D, 25-Hydroxy: 19 ng/mL — ABNORMAL LOW (ref 30–100)

## 2016-08-25 LAB — PROTEIN ELECTROPHORESIS, SERUM
Albumin ELP: 4.1 g/dL (ref 3.8–4.8)
Alpha-1-Globulin: 0.3 g/dL (ref 0.2–0.3)
Alpha-2-Globulin: 0.9 g/dL (ref 0.5–0.9)
Beta 2: 0.5 g/dL (ref 0.2–0.5)
Beta Globulin: 0.5 g/dL (ref 0.4–0.6)
Gamma Globulin: 1.3 g/dL (ref 0.8–1.7)
Total Protein, Serum Electrophoresis: 7.5 g/dL (ref 6.1–8.1)

## 2016-08-25 NOTE — Progress Notes (Signed)
SPEP unremarkable.. Please confirm patient is taking vitamD

## 2016-08-26 ENCOUNTER — Encounter: Payer: Self-pay | Admitting: Family

## 2016-08-28 NOTE — Progress Notes (Deleted)
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   History of Present Illness: Nicole Quinn is a 41 y.o. female ***   Activities of Daily Living:  Patient reports morning stiffness for *** {minute/hour:19697}.   Patient {ACTIONS;DENIES/REPORTS:21021675::"Denies"} nocturnal pain.  Difficulty dressing/grooming: {ACTIONS;DENIES/REPORTS:21021675::"Denies"} Difficulty climbing stairs: {ACTIONS;DENIES/REPORTS:21021675::"Denies"} Difficulty getting out of chair: {ACTIONS;DENIES/REPORTS:21021675::"Denies"} Difficulty using hands for taps, buttons, cutlery, and/or writing: {ACTIONS;DENIES/REPORTS:21021675::"Denies"}   No Rheumatology ROS completed.   PMFS History:  Patient Active Problem List   Diagnosis Date Noted  . Angioedema 06/26/2016  . Seasonal and perennial allergic rhinitis 06/26/2016  . Allergic conjunctivitis 06/26/2016  . Food allergy 06/26/2016  . Allergic urticaria 06/26/2016  . Depression 01/29/2016  . Left wrist pain 11/29/2015  . Obesity (BMI 30.0-34.9) 08/12/2013  . Other and unspecified hyperlipidemia 02/24/2013  . Chronic constipation 02/22/2013  . Routine general medical examination at a health care facility 02/22/2013  . Anxiety associated with depression 10/10/2011  . Dyspnea 08/24/2011  . OTHER ACNE 06/04/2009  . HYPERGLYCEMIA, BORDERLINE 06/04/2009  . Essential hypertension 04/10/2009    Past Medical History:  Diagnosis Date  . Allergy   . Anemia   . Endometriosis   . History of echocardiogram    Echo 6/18: EF 60-65, no RWMA, normal diastolic function  . History of nuclear stress test    Myoview 6/18: EF 60, normal perfusion, Low Risk  . History of UTI   . Hypertension    pregnancy induced HTN/pre-eclampsia //  on meds  since 2nd child (2000)    Family History  Problem Relation Age of Onset  . Allergies Daughter   . Allergic rhinitis Daughter   . Allergies Son   . Allergic rhinitis Son   . Leukemia Paternal Grandmother   . Arthritis Mother   . Hyperlipidemia Mother   . Hypertension Mother   . Stroke Mother   . Allergic rhinitis Mother   . Hypertension Father   . Diabetes Father   . Arthritis Maternal Grandmother   . Stroke Maternal Grandmother 50  . Hypertension Maternal Grandmother   . Heart attack Maternal Grandmother 50  . Coronary artery disease Maternal Aunt 50  . Coronary artery disease Maternal Aunt 50  . Angioedema Neg Hx   . Asthma Neg Hx   . Eczema Neg Hx   . Immunodeficiency Neg Hx   . Urticaria Neg Hx    Past Surgical History:  Procedure Laterality Date  . ABDOMINAL HYSTERECTOMY  2003   partial  . CARPAL TUNNEL RELEASE     right  . GANGLION CYST EXCISION     right  . Hammer Toe Repair Bilateral 12/29/2013   Lt #3, #4, Rt #2.   Social History   Social History Narrative   Regular exercise: yes.   Married.  Lives with one child.  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             Objective: Vital Signs: There were no vitals taken for this visit.   Physical Exam   Musculoskeletal Exam: ***  CDAI Exam: No CDAI exam completed.    Investigation: Findings:  05/29/16 Sed Rate (ESR)  21, ANA normal, and Rheumatoid Factor normal ,  06/05/2016 UA negative, CBC hemoglobin 11.9, LFTs normal, TSH normal, histamine release negative, ANA negative, RF negative, ESR 10, C1q and C4 normal, C1 esterase inhibitor negative, 07/28/2016 BMP potassium low at 3.1  08/06/2016 Vitamin D low 24, 14-3-3 eta Protein negative , Angiotensin converting enzyme negative ,  ENA Panel negative, Cyclic Citrul Peptide Antibody, IGG normal, Pan-ANCA normal, and Serum protein electrophoresis with reflex normal     Imaging: No results found.  Speciality Comments: No specialty comments  available.    Procedures:  No procedures performed Allergies: Amoxicillin and Ace inhibitors   Assessment / Plan:     Visit Diagnoses: Edema, lower extremity  Allergic urticaria  History of anxiety  History of hyperlipidemia  History of seasonal allergies  History of hypertension  History of hyperglycemia    Orders: No orders of the defined types were placed in this encounter.  No orders of the defined types were placed in this encounter.   Face-to-face time spent with patient was *** minutes. 50% of time was spent in counseling and coordination of care.  Follow-Up Instructions: No Follow-up on file.   Ladon Heney, RT  Note - This record has been created using [employer].  Chart creation errors have been sought, but may not always  have been located. Such creation errors do not reflect on  the standard of medical care.

## 2016-09-03 ENCOUNTER — Telehealth: Payer: Self-pay | Admitting: Family

## 2016-09-03 ENCOUNTER — Ambulatory Visit: Payer: Self-pay | Admitting: Family

## 2016-09-03 NOTE — Telephone Encounter (Addendum)
Patient cancelled her 12:15 appointment today and [facility] to 09/15/16 with PCP, charge or no charge

## 2016-09-03 NOTE — Telephone Encounter (Signed)
No charge. 

## 2016-09-04 ENCOUNTER — Ambulatory Visit: Payer: PRIVATE HEALTH INSURANCE | Admitting: Rheumatology

## 2016-09-09 ENCOUNTER — Encounter: Payer: Self-pay | Admitting: Family Medicine

## 2016-09-09 ENCOUNTER — Ambulatory Visit (INDEPENDENT_AMBULATORY_CARE_PROVIDER_SITE_OTHER): Payer: PRIVATE HEALTH INSURANCE | Admitting: Family Medicine

## 2016-09-09 DIAGNOSIS — M25532 Pain in left wrist: Secondary | ICD-10-CM | POA: Diagnosis not present

## 2016-09-09 MED ORDER — DICLOFENAC SODIUM 75 MG PO TBEC: 75.0000 mg | DELAYED_RELEASE_TABLET | Freq: Two times a day (BID) | ORAL | 1 refills | Status: DC

## 2016-09-09 NOTE — Progress Notes (Signed)
PCP: Debbrah Alar, NP  Subjective:   HPI: Patient is a 41 y.o. female here for left hand/wrist pain.  Patient reports she started to get pain dorsal left hand and wrist about 2 months ago. No acute injury or trauma. Pain radiating into 2nd and 3rd digits of left hand. Gets some numbness and tingling into same fingers. No pain up the arm or into neck. Worse after work where she does a lot of typing. Tried ibuprofen. Pain up to 5/10 and achy. No skin changes.  Past Medical History:  Diagnosis Date  . Allergy   . Anemia   . Endometriosis   . History of echocardiogram    Echo 6/18: EF 60-65, no RWMA, normal diastolic function  . History of nuclear stress test    Myoview 6/18: EF 60, normal perfusion, Low Risk  . History of UTI   . Hypertension    pregnancy induced HTN/pre-eclampsia //  on meds since 2nd child (2000)    Current Outpatient Prescriptions on File Prior to Visit  Medication Sig Dispense Refill  . cloNIDine (CATAPRES) 0.1 MG tablet Take 1 tablet (0.1 mg total) by mouth 2 (two) times daily. 28 tablet 0  . fluticasone (FLONASE) 50 MCG/ACT nasal spray Place 2 sprays into both nostrils daily as needed for allergies or rhinitis. 18.2 g 5  . furosemide (LASIX) 20 MG tablet TAKE ONE TABLET BY MOUTH ONCE DAILY AS NEEDED 90 tablet 1  . hydrochlorothiazide (HYDRODIURIL) 25 MG tablet Take 1 tablet (25 mg total) by mouth daily. 30 tablet 3  . levocetirizine (XYZAL) 5 MG tablet Take 1 tablet (5 mg total) by mouth every evening. 30 tablet 5  . metoprolol succinate (TOPROL XL) 100 MG 24 hr tablet Take 1 tablet (100 mg total) by mouth daily. Take with or immediately following a meal. 30 tablet 2  . Multiple Vitamin (MULTIVITAMIN) tablet Take 1 tablet by mouth daily.      Marland Kitchen olopatadine (PATANOL) 0.1 % ophthalmic solution Place 1 drop into both eyes 2 (two) times daily as needed for allergies. 5 mL 5  . potassium chloride SA (K-DUR,KLOR-CON) 20 MEQ tablet Take 1 tablet (20 mEq  total) by mouth 2 (two) times daily. 30 tablet 3  . venlafaxine XR (EFFEXOR-XR) 150 MG 24 hr capsule TAKE 1 CAPSULE DAILY WITH BREAKFAST 90 capsule 1  . Vitamin D, Ergocalciferol, (DRISDOL) 50000 units CAPS capsule Take 1 capsule (50,000 Units total) by mouth every 7 (seven) days. 12 capsule 0   No current facility-administered medications on file prior to visit.     Past Surgical History:  Procedure Laterality Date  . ABDOMINAL HYSTERECTOMY  2003   partial  . CARPAL TUNNEL RELEASE     right  . GANGLION CYST EXCISION     right  . Hammer Toe Repair Bilateral 12/29/2013   Lt #3, #4, Rt #2.    Allergies  Allergen Reactions  . Amoxicillin     rash  . Ace Inhibitors     REACTION: lip swelling    Social History   Social History  . Marital status: Married    Spouse name: N/A  . Number of children: 2  . Years of education: N/A   Occupational History  .  [employer] History Main Topics  . Smoking status: Never Smoker  . Smokeless tobacco: Never Used  . Alcohol use No  . Drug use: No  . Sexual activity: Not on  file   Other Topics Concern  . Not on file   Social History Narrative   Regular exercise: yes.   Married.  Lives with one child.  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            Family History  Problem Relation Age of Onset  . Allergies Daughter   . Allergic rhinitis Daughter   . Allergies Son   . Allergic rhinitis Son   . Leukemia Paternal Grandmother   . Arthritis Mother   . Hyperlipidemia Mother   . Hypertension Mother   . Stroke Mother   . Allergic rhinitis Mother   . Hypertension Father   . Diabetes Father   . Arthritis Maternal Grandmother   . Stroke Maternal Grandmother 50  . Hypertension Maternal Grandmother   . Heart attack Maternal Grandmother 50  . Coronary artery disease Maternal Aunt 50  . Coronary artery disease Maternal Aunt 50  . Angioedema Neg Hx   .  Asthma Neg Hx   . Eczema Neg Hx   . Immunodeficiency Neg Hx   . Urticaria Neg Hx     BP 126/85   Pulse 73   Ht 5\' 4"  (1.626 m)   Wt 221 lb 3.2 oz (100.3 kg)   BMI 37.97 kg/m   Review of Systems: See HPI above.     Objective:  Physical Exam:  Gen: NAD, comfortable in exam room  Left hand/wrist: No gross deformity, swelling, bruising.  No malrotation or angulation of digits. TTP over extensor tendons of 2nd-5th digits but greatest 2nd and 3rd digits. FROM digits and wrist.  Pain on wrist extension and extension of digits.  Strength 5/5 with all motions. Negative tinels, phalens. Negative finkelsteins. NVI distally   Assessment & Plan:  1. Left wrist/hand pain - consistent with extensor digitorum tendinopathy.  MSK u/s showed no evidence tendon tear or tenosynovitis.  Reassured patient.  Icing, diclofenac.  Consider occupational therapy if not improving.  F/u in 6 weeks.

## 2016-09-09 NOTE — Patient Instructions (Signed)
You have a tendinopathy of your extensor digitorum of your hand/fingers. Ice the area 15 minutes at a time 3-4 times a day. Diclofenac twice a day with food for pain and inflammation. Consider occupational therapy - call us if you want to go ahead with this. Follow up with me in 6 weeks otherwise.

## 2016-09-09 NOTE — Assessment & Plan Note (Signed)
consistent with extensor digitorum tendinopathy.  MSK u/s showed no evidence tendon tear or tenosynovitis.  Reassured patient.  Icing, diclofenac.  Consider occupational therapy if not improving.  F/u in 6 weeks.

## 2016-09-10 ENCOUNTER — Ambulatory Visit: Payer: PRIVATE HEALTH INSURANCE | Admitting: Family Medicine

## 2016-09-10 ENCOUNTER — Ambulatory Visit: Payer: Self-pay | Admitting: Family Medicine

## 2016-09-15 ENCOUNTER — Ambulatory Visit: Payer: PRIVATE HEALTH INSURANCE | Admitting: Family

## 2016-09-18 ENCOUNTER — Ambulatory Visit: Payer: PRIVATE HEALTH INSURANCE | Admitting: Cardiology

## 2016-09-24 ENCOUNTER — Other Ambulatory Visit: Payer: Self-pay | Admitting: Family

## 2016-09-24 NOTE — Telephone Encounter (Signed)
Furosemide refill sent to pharmacy for 90 day supply only. Pt is due for 3 month follow up on 10/28/16 but has not scheduled it yet. Sent mychart message to pt.

## 2016-10-07 ENCOUNTER — Encounter: Payer: Self-pay | Admitting: Podiatry

## 2016-10-07 ENCOUNTER — Ambulatory Visit (INDEPENDENT_AMBULATORY_CARE_PROVIDER_SITE_OTHER): Payer: PRIVATE HEALTH INSURANCE | Admitting: Podiatry

## 2016-10-07 VITALS — BP 146/95 | HR 71

## 2016-10-07 DIAGNOSIS — B351 Tinea unguium: Secondary | ICD-10-CM | POA: Diagnosis not present

## 2016-10-07 DIAGNOSIS — M25472 Effusion, left ankle: Secondary | ICD-10-CM | POA: Diagnosis not present

## 2016-10-07 DIAGNOSIS — M25471 Effusion, right ankle: Secondary | ICD-10-CM | POA: Diagnosis not present

## 2016-10-07 DIAGNOSIS — M6700 Short Achilles tendon (acquired), unspecified ankle: Secondary | ICD-10-CM | POA: Diagnosis not present

## 2016-10-07 MED ORDER — CICLOPIROX 8 % EX SOLN: Freq: Every day | CUTANEOUS | 3 refills | Status: DC

## 2016-10-07 NOTE — Progress Notes (Signed)
SUBJECTIVE: 41 year old female presents complaining of splitting toe nail and swollen ankles at lateral aspect. On feet at work not much, has sitting job as a Therapist, art.  HPI: Positive for hammer toe surgery 2-4 bilateral in 2015.   OBJECTIVE: DERMATOLOGIC EXAMINATION: Normal nails. Digital corns at DIPJ on 2nd right, 3rd and 4th left, and PIPJ 5th digt left. VASCULAR EXAMINATION OF LOWER LIMBS: Pedal pulses: All pedal pulses are palpable with normal pulsation.  NEUROLOGIC EXAMINATION OF THE LOWER LIMBS: All epicritic and tactile sensations grossly intact.  MUSCULOSKELETAL EXAMINATION: Mild Hallux valgus with bunion bilateral. Post surgical hammer toe repair 2-4 bilateral in 2015.  ASSESSMENT: Old incision scar over lesser digits from hammer toe surgery. Tight Achilles tendon bilateral. Yellow discolored toe nails on lateral 1/3 of left great toe and lateral 1/4 of right great toe nail. Mild lateral ankle edema bilateral.  PLAN: Reviewed clinical findings and available options.  Advised to do daily stretch exercise for tight achilles tendon. Treat fungal nails with Penac. Return in one month if swelling remain in ankle area. May require physical therapy.

## 2016-10-07 NOTE — Patient Instructions (Signed)
Seen for abnormal nails and swollen ankle. Noted of tight achilles tendon that need to be stretched. Penlac prescribed. Do daily stretch exercise. Return as needed.

## 2016-10-14 ENCOUNTER — Ambulatory Visit (INDEPENDENT_AMBULATORY_CARE_PROVIDER_SITE_OTHER): Payer: PRIVATE HEALTH INSURANCE | Admitting: Family

## 2016-10-14 VITALS — BP 150/90 | HR 84 | Temp 98.4°F | Resp 16 | Ht 64.0 in | Wt 230.0 lb

## 2016-10-14 DIAGNOSIS — J029 Acute pharyngitis, unspecified: Secondary | ICD-10-CM

## 2016-10-14 DIAGNOSIS — J069 Acute upper respiratory infection, unspecified: Secondary | ICD-10-CM

## 2016-10-14 LAB — POCT RAPID STREP A (OFFICE): Rapid Strep A Screen: NEGATIVE

## 2016-10-14 MED ORDER — BENZONATATE 100 MG PO CAPS: 100.0000 mg | ORAL_CAPSULE | Freq: Three times a day (TID) | ORAL | 0 refills | Status: DC | PRN

## 2016-10-14 NOTE — Progress Notes (Signed)
Subjective:    Patient ID: Nicole Quinn, female    DOB: 1975-02-01, 41 y.o.   MRN: [id number]  HPI  Nicole Quinn is a 41 yr old female who presents today with chief complaint of nasal congestion, congestion, sore throat.  Has mild associated ear pain. Using otc meds without improvement.   Review of Systems See HPI  Past Medical History:  Diagnosis Date  . Allergy   . Anemia   . Endometriosis   . History of echocardiogram    Echo 6/18: EF 60-65, no RWMA, normal diastolic function  . History of nuclear stress test    Myoview 6/18: EF 60, normal perfusion, Low Risk  . History of UTI   . Hypertension    pregnancy induced HTN/pre-eclampsia //  on meds since 2nd child (2000)     Social History   Social History  . Marital status: Married    Spouse name: N/A  . Number of children: 2  . Years of education: N/A   Occupational History  .  [employer] History Main Topics  . Smoking status: Never Smoker  . Smokeless tobacco: Never Used  . Alcohol use No  . Drug use: No  . Sexual activity: Not on file   Other Topics Concern  . Not on file   Social History Narrative   Regular exercise: yes.   Married.  Lives with one child.  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            Past Surgical History:  Procedure Laterality Date  . ABDOMINAL HYSTERECTOMY  2003   partial  . CARPAL TUNNEL RELEASE     right  . GANGLION CYST EXCISION     right  . Hammer Toe Repair Bilateral 12/29/2013   Lt #3, #4, Rt #2.    Family History  Problem Relation Age of Onset  . Allergies Daughter   . Allergic rhinitis Daughter   . Allergies Son   . Allergic rhinitis Son   . Leukemia Paternal Grandmother   . Arthritis Mother   . Hyperlipidemia Mother   . Hypertension Mother   . Stroke Mother   . Allergic rhinitis Mother   . Hypertension Father   . Diabetes Father   . Arthritis Maternal Grandmother   . Stroke  Maternal Grandmother 50  . Hypertension Maternal Grandmother   . Heart attack Maternal Grandmother 50  . Coronary artery disease Maternal Aunt 50  . Coronary artery disease Maternal Aunt 50  . Angioedema Neg Hx   . Asthma Neg Hx   . Eczema Neg Hx   . Immunodeficiency Neg Hx   . Urticaria Neg Hx     Allergies  Allergen Reactions  . Amoxicillin     rash  . Ace Inhibitors     REACTION: lip swelling    Current Outpatient Prescriptions on File Prior to Visit  Medication Sig Dispense Refill  . ciclopirox (PENLAC) 8 % solution Apply topically at bedtime. Apply over nail and surrounding skin. Apply daily over previous coat. After seven (7) days, may remove with alcohol and continue cycle. 6.6 mL 3  . cloNIDine (CATAPRES) 0.1 MG tablet Take 1 tablet (0.1 mg total) by mouth 2 (two) times daily. 28 tablet 0  . diclofenac (VOLTAREN) 75 MG EC tablet Take 1 tablet (75 mg total) by mouth 2 (two) times daily. 60 tablet 1  . fluticasone (FLONASE) 50 MCG/ACT nasal spray  Place 2 sprays into both nostrils daily as needed for allergies or rhinitis. 18.2 g 5  . furosemide (LASIX) 20 MG tablet TAKE 1 TABLET BY MOUTH ONCE DAILY AS NEEDED 90 tablet 0  . hydrochlorothiazide (HYDRODIURIL) 25 MG tablet Take 1 tablet (25 mg total) by mouth daily. 30 tablet 3  . levocetirizine (XYZAL) 5 MG tablet Take 1 tablet (5 mg total) by mouth every evening. 30 tablet 5  . metoprolol succinate (TOPROL XL) 100 MG 24 hr tablet Take 1 tablet (100 mg total) by mouth daily. Take with or immediately following a meal. 30 tablet 2  . Multiple Vitamin (MULTIVITAMIN) tablet Take 1 tablet by mouth daily.      Marland Kitchen olopatadine (PATANOL) 0.1 % ophthalmic solution Place 1 drop into both eyes 2 (two) times daily as needed for allergies. 5 mL 5  . potassium chloride SA (K-DUR,KLOR-CON) 20 MEQ tablet Take 1 tablet (20 mEq total) by mouth 2 (two) times daily. 30 tablet 3  . venlafaxine XR (EFFEXOR-XR) 150 MG 24 hr capsule TAKE 1 CAPSULE DAILY  WITH BREAKFAST 90 capsule 1   No current facility-administered medications on file prior to visit.     BP (!) 150/90 (BP Location: Left Arm, Cuff Size: Large)   Pulse 84   Temp 98.4 F (36.9 C) (Oral)   Resp 16   Ht 5\' 4"  (1.626 m)   Wt 230 lb (104.3 kg)   SpO2 98%   BMI 39.48 kg/m       Objective:   Physical Exam  Constitutional: She is oriented to person, place, and time. She appears well-developed and well-nourished.  HENT:  Head: Normocephalic and atraumatic.  Right Ear: Ear canal normal.  Left Ear: Ear canal normal.  Bilateral serous effusions without bulging/erythema  Cardiovascular: Normal rate, regular rhythm and normal heart sounds.   No murmur heard. Pulmonary/Chest: Effort normal and breath sounds normal. No respiratory distress. She has no wheezes.  Lymphadenopathy:    She has no cervical adenopathy.  Neurological: She is alert and oriented to person, place, and time.  Skin: Skin is warm and dry.  Psychiatric: She has a normal mood and affect. Her behavior is normal. Judgment and thought content normal.          Assessment & Plan:  Viral URI- symptoms most consistent with viral URI.  Rx sent for tessalon.   Advised pt as follows: Stop Alkaselzer cold (due to elevated blood pressure). For congestion you may use Coricidin HBP or mucinex and nasal saline spray.   Call if new/worsening symptoms or if not improved in 3 days. Pt verbalizes understanding. Rapid strep is negative.

## 2016-10-14 NOTE — Patient Instructions (Addendum)
Stop Alkaselzer cold. For congestion you may use Coricidin HBP or mucinex and nasal saline spray.   Call if new/worsening symptoms or if not improved in 3 days.  Upper Respiratory Infection, Adult Most upper respiratory infections (URIs) are a viral infection of the air passages leading to the lungs. A URI affects the nose, throat, and upper air passages. The most common type of URI is nasopharyngitis and is typically referred to as "the common cold." URIs run their course and usually go away on their own. Most of the time, a URI does not require medical attention, but sometimes a bacterial infection in the upper airways can follow a viral infection. This is called a secondary infection. Sinus and middle ear infections are common types of secondary upper respiratory infections. Bacterial pneumonia can also complicate a URI. A URI can worsen asthma and chronic obstructive pulmonary disease (COPD). Sometimes, these complications can require emergency medical care and may be life threatening. What are the causes? Almost all URIs are caused by viruses. A virus is a type of germ and can spread from one person to another. What increases the risk? You may be at risk for a URI if:  You smoke.  You have chronic heart or lung disease.  You have a weakened defense (immune) system.  You are very young or very old.  You have nasal allergies or asthma.  You work in crowded or poorly ventilated areas.  You work in health care facilities or schools.  What are the signs or symptoms? Symptoms typically develop 2-3 days after you come in contact with a cold virus. Most viral URIs last 7-10 days. However, viral URIs from the influenza virus (flu virus) can last 14-18 days and are typically more severe. Symptoms may include:  Runny or stuffy (congested) nose.  Sneezing.  Cough.  Sore throat.  Headache.  Fatigue.  Fever.  Loss of appetite.  Pain in your forehead, behind your eyes, and over  your cheekbones (sinus pain).  Muscle aches.  How is this diagnosed? Your health care provider may diagnose a URI by:  Physical exam.  Tests to check that your symptoms are not due to another condition such as: ? Strep throat. ? Sinusitis. ? Pneumonia. ? Asthma.  How is this treated? A URI goes away on its own with time. It cannot be cured with medicines, but medicines may be prescribed or recommended to relieve symptoms. Medicines may help:  Reduce your fever.  Reduce your cough.  Relieve nasal congestion.  Follow these instructions at home:  Take medicines only as directed by your health care provider.  Gargle warm saltwater or take cough drops to comfort your throat as directed by your health care provider.  Use a warm mist humidifier or inhale steam from a shower to increase air moisture. This may make it easier to breathe.  Drink enough fluid to keep your urine clear or pale yellow.  Eat soups and other clear broths and maintain good nutrition.  Rest as needed.  Return to work when your temperature has returned to normal or as your health care provider advises. You may need to stay home longer to avoid infecting others. You can also use a face mask and careful hand washing to prevent spread of the virus.  Increase the usage of your inhaler if you have asthma.  Do not use any tobacco products, including cigarettes, chewing tobacco, or electronic cigarettes. If you need help quitting, ask your health care provider. How is this  prevented? The best way to protect yourself from getting a cold is to practice good hygiene.  Avoid oral or hand contact with people with cold symptoms.  Wash your hands often if contact occurs.  There is no clear evidence that vitamin C, vitamin E, echinacea, or exercise reduces the chance of developing a cold. However, it is always recommended to get plenty of rest, exercise, and practice good nutrition. Contact a health care provider  if:  You are getting worse rather than better.  Your symptoms are not controlled by medicine.  You have chills.  You have worsening shortness of breath.  You have brown or red mucus.  You have yellow or brown nasal discharge.  You have pain in your face, especially when you bend forward.  You have a fever.  You have swollen neck glands.  You have pain while swallowing.  You have white areas in the back of your throat. Get help right away if:  You have severe or persistent: ? Headache. ? Ear pain. ? Sinus pain. ? Chest pain.  You have chronic lung disease and any of the following: ? Wheezing. ? Prolonged cough. ? Coughing up blood. ? A change in your usual mucus.  You have a stiff neck.  You have changes in your: ? Vision. ? Hearing. ? Thinking. ? Mood. This information is not intended to replace advice given to you by your health care provider. Make sure you discuss any questions you have with your health care provider. Document Released: 07/02/2000 Document Revised: 09/09/2015 Document Reviewed: 04/13/2013 Elsevier Interactive Patient Education  2017 [employer].

## 2016-10-27 ENCOUNTER — Ambulatory Visit: Payer: Self-pay | Admitting: Family

## 2016-11-02 ENCOUNTER — Other Ambulatory Visit: Payer: Self-pay | Admitting: Family

## 2016-11-06 ENCOUNTER — Encounter: Payer: Self-pay | Admitting: Family

## 2016-11-07 ENCOUNTER — Other Ambulatory Visit: Payer: Self-pay | Admitting: Family

## 2016-11-07 MED ORDER — METRONIDAZOLE 500 MG PO TABS: 500.0000 mg | ORAL_TABLET | Freq: Two times a day (BID) | ORAL | 0 refills | Status: DC

## 2016-12-07 ENCOUNTER — Other Ambulatory Visit: Payer: Self-pay | Admitting: Family

## 2016-12-11 ENCOUNTER — Encounter: Payer: Self-pay | Admitting: Family

## 2016-12-15 ENCOUNTER — Encounter: Payer: Self-pay | Admitting: Family

## 2016-12-15 ENCOUNTER — Ambulatory Visit (INDEPENDENT_AMBULATORY_CARE_PROVIDER_SITE_OTHER): Payer: PRIVATE HEALTH INSURANCE | Admitting: Family

## 2016-12-15 VITALS — BP 128/92 | HR 83 | Temp 98.0°F | Resp 16 | Ht 64.0 in | Wt 219.8 lb

## 2016-12-15 DIAGNOSIS — D473 Essential (hemorrhagic) thrombocythemia: Secondary | ICD-10-CM | POA: Diagnosis not present

## 2016-12-15 DIAGNOSIS — D75839 Thrombocytosis, unspecified: Secondary | ICD-10-CM

## 2016-12-15 DIAGNOSIS — K625 Hemorrhage of anus and rectum: Secondary | ICD-10-CM | POA: Diagnosis not present

## 2016-12-15 NOTE — Telephone Encounter (Signed)
Pt should be seen today please.

## 2016-12-15 NOTE — Progress Notes (Signed)
Subjective:    Patient ID: Nicole Quinn, female    DOB: 27-Jan-1975, 41 y.o.   MRN: [id number]  HPI   Painless episode of BRBPR.  Reports remote hx of hemorrhoids.  Denies constipation/straining. Saw Dr. Collene Mares last year and had a reportedly normal colo.    Review of Systems See HPI  Past Medical History:  Diagnosis Date  . Allergy   . Anemia   . Endometriosis   . History of echocardiogram    Echo 6/18: EF 60-65, no RWMA, normal diastolic function  . History of nuclear stress test    Myoview 6/18: EF 60, normal perfusion, Low Risk  . History of UTI   . Hypertension    pregnancy induced HTN/pre-eclampsia //  on meds since 2nd child (2000)     Social History   Socioeconomic History  . Marital status: Married    Spouse name: Not on file  . Number of children: 2  . Years of education: Not on file  . Highest education level: Not on file  Social Needs  . Financial resource strain: Not on file  . Food insecurity - worry: Not on file  . Food insecurity - inability: Not on file  . Transportation needs - medical: Not on file  . 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 Smoking status: Never Smoker  . Smokeless tobacco: Never Used  Substance and Sexual Activity  . Alcohol use: No    Alcohol/week: 0.0 oz  . Drug use: No  . Sexual activity: Not on file  Other Topics Concern  . Not on file  Social History Narrative   Regular exercise: yes.   Married.  Lives with one child.  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         Past Surgical History:  Procedure Laterality Date  . ABDOMINAL HYSTERECTOMY  2003   partial  . CARPAL TUNNEL RELEASE     right  . GANGLION CYST EXCISION     right  . Hammer Toe Repair Bilateral 12/29/2013   Lt #3, #4, Rt #2.    Family History  Problem Relation Age of Onset  . Allergies Daughter   .  Allergic rhinitis Daughter   . Allergies Son   . Allergic rhinitis Son   . Leukemia Paternal Grandmother   . Arthritis Mother   . Hyperlipidemia Mother   . Hypertension Mother   . Stroke Mother   . Allergic rhinitis Mother   . Hypertension Father   . Diabetes Father   . Arthritis Maternal Grandmother   . Stroke Maternal Grandmother 50  . Hypertension Maternal Grandmother   . Heart attack Maternal Grandmother 50  . Coronary artery disease Maternal Aunt 50  . Coronary artery disease Maternal Aunt 50  . Angioedema Neg Hx   . Asthma Neg Hx   . Eczema Neg Hx   . Immunodeficiency Neg Hx   . Urticaria Neg Hx     Allergies  Allergen Reactions  . Amoxicillin     rash  . Ace Inhibitors     REACTION: lip swelling    Current Outpatient Medications on File Prior to Visit  Medication Sig Dispense Refill  . ciclopirox (PENLAC) 8 % solution Apply topically at bedtime. Apply over nail and surrounding skin. Apply daily over previous coat. After seven (7) days, may remove with alcohol and  continue cycle. 6.6 mL 3  . cloNIDine (CATAPRES) 0.1 MG tablet Take 1 tablet (0.1 mg total) by mouth 2 (two) times daily. 28 tablet 0  . fluticasone (FLONASE) 50 MCG/ACT nasal spray Place 2 sprays into both nostrils daily as needed for allergies or rhinitis. 18.2 g 5  . furosemide (LASIX) 20 MG tablet TAKE 1 TABLET BY MOUTH ONCE DAILY AS NEEDED 90 tablet 0  . hydrochlorothiazide (HYDRODIURIL) 25 MG tablet TAKE 1 TABLET BY MOUTH ONCE DAILY 30 tablet 5  . levocetirizine (XYZAL) 5 MG tablet Take 1 tablet (5 mg total) by mouth every evening. 30 tablet 5  . metoprolol succinate (TOPROL XL) 100 MG 24 hr tablet Take 1 tablet (100 mg total) by mouth daily. Take with or immediately following a meal. 30 tablet 2  . Multiple Vitamin (MULTIVITAMIN) tablet Take 1 tablet by mouth daily.      Marland Kitchen olopatadine (PATANOL) 0.1 % ophthalmic solution Place 1 drop into both eyes 2 (two) times daily as needed for allergies. 5 mL 5  .  potassium chloride SA (K-DUR,KLOR-CON) 20 MEQ tablet Take 1 tablet (20 mEq total) by mouth 2 (two) times daily. 30 tablet 3  . venlafaxine XR (EFFEXOR-XR) 150 MG 24 hr capsule TAKE 1 CAPSULE DAILY WITH BREAKFAST 90 capsule 1   No current facility-administered medications on file prior to visit.     BP (!) 128/92 (BP Location: Right Arm, Cuff Size: Large)   Pulse 83   Temp 98 F (36.7 C) (Oral)   Resp 16   Ht 5\' 4"  (1.626 m)   Wt 219 lb 12.8 oz (99.7 kg)   SpO2 100%   BMI 37.73 kg/m       Objective:   Physical Exam  Constitutional: She appears well-developed and well-nourished.  Cardiovascular: Normal rate, regular rhythm and normal heart sounds.  No murmur heard. Pulmonary/Chest: Effort normal and breath sounds normal. No respiratory distress. She has no wheezes.  Genitourinary:  Genitourinary Comments: + external hemorrhoids noted.  Normal internal rectal exam.  No blood noted on exam today  Psychiatric: She has a normal mood and affect. Her behavior is normal. Judgment and thought content normal.          Assessment & Plan:  Rectal bleeding- x 1.  Suspect hemorrhoidal bleed. Check CBC.  Refer back to her GI. Advised as follows:  Please purchase over the counter preparation H and apply twice daily for the next week. Eat plenty of fresh fruits/veggies and drink lots of water to keep your stools soft. If you need to you may add metamucil once daily. We will arrange a follow up appointment with Dr. Collene Mares.  Go to the ER if you develop multiple bloody bowel movements in a row, dizziness or shortness of breath.  Thrombocytosis- Will obtain follow up cbc.

## 2016-12-15 NOTE — Patient Instructions (Addendum)
Please purchase over the counter preparation H and apply twice daily for the next week. Eat plenty of fresh fruits/veggies and drink lots of water to keep your stools soft. If you need to you may add metamucil once daily. We will arrange a follow up appointment with Dr. Collene Mares.  Go to the ER if you develop multiple bloody bowel movements in a row, dizziness or shortness of breath.

## 2016-12-15 NOTE — Telephone Encounter (Signed)
Attempted to reach pt and left detailed message on cell # and also sent mychart message.

## 2016-12-16 LAB — CBC WITH DIFFERENTIAL/PLATELET
Basophils Absolute: 0 10*3/uL (ref 0.0–0.1)
Basophils Relative: 0.4 % (ref 0.0–3.0)
Eosinophils Absolute: 0 10*3/uL (ref 0.0–0.7)
Eosinophils Relative: 0.4 % (ref 0.0–5.0)
HCT: 35.3 % — ABNORMAL LOW (ref 36.0–46.0)
Hemoglobin: 11.8 g/dL — ABNORMAL LOW (ref 12.0–15.0)
Lymphocytes Relative: 32.5 % (ref 12.0–46.0)
Lymphs Abs: 2.6 10*3/uL (ref 0.7–4.0)
MCHC: 33.4 g/dL (ref 30.0–36.0)
MCV: 91.5 fl (ref 78.0–100.0)
Monocytes Absolute: 0.6 10*3/uL (ref 0.1–1.0)
Monocytes Relative: 7.5 % (ref 3.0–12.0)
Neutro Abs: 4.7 10*3/uL (ref 1.4–7.7)
Neutrophils Relative %: 59.2 % (ref 43.0–77.0)
Platelets: 385 10*3/uL (ref 150.0–400.0)
RBC: 3.85 Mil/uL — ABNORMAL LOW (ref 3.87–5.11)
RDW: 13.6 % (ref 11.5–15.5)
WBC: 7.9 10*3/uL (ref 4.0–10.5)

## 2016-12-24 ENCOUNTER — Other Ambulatory Visit: Payer: Self-pay | Admitting: Family

## 2017-02-09 DIAGNOSIS — Z79899 Other long term (current) drug therapy: Secondary | ICD-10-CM | POA: Diagnosis not present

## 2017-02-09 DIAGNOSIS — L700 Acne vulgaris: Secondary | ICD-10-CM | POA: Diagnosis not present

## 2017-02-10 ENCOUNTER — Other Ambulatory Visit: Payer: Self-pay | Admitting: Family

## 2017-02-10 MED ORDER — FUROSEMIDE 20 MG PO TABS: 20.0000 mg | ORAL_TABLET | Freq: Every day | ORAL | 0 refills | Status: DC | PRN

## 2017-02-10 NOTE — Telephone Encounter (Signed)
LV: 12/15/16 NV: 03/10/17 LR: 12/08/16

## 2017-02-18 ENCOUNTER — Other Ambulatory Visit: Payer: Self-pay | Admitting: Family

## 2017-02-18 ENCOUNTER — Encounter: Payer: Self-pay | Admitting: Family

## 2017-02-18 MED ORDER — VENLAFAXINE HCL ER 75 MG PO CP24: 75.0000 mg | ORAL_CAPSULE | Freq: Every day | ORAL | 0 refills | Status: DC

## 2017-03-04 DIAGNOSIS — M546 Pain in thoracic spine: Secondary | ICD-10-CM | POA: Diagnosis not present

## 2017-03-04 DIAGNOSIS — M62830 Muscle spasm of back: Secondary | ICD-10-CM | POA: Diagnosis not present

## 2017-03-04 DIAGNOSIS — M9903 Segmental and somatic dysfunction of lumbar region: Secondary | ICD-10-CM | POA: Diagnosis not present

## 2017-03-04 DIAGNOSIS — M545 Low back pain: Secondary | ICD-10-CM | POA: Diagnosis not present

## 2017-03-05 ENCOUNTER — Encounter: Payer: Self-pay | Admitting: Family Medicine

## 2017-03-10 ENCOUNTER — Other Ambulatory Visit: Payer: Self-pay | Admitting: Family

## 2017-03-10 ENCOUNTER — Ambulatory Visit (INDEPENDENT_AMBULATORY_CARE_PROVIDER_SITE_OTHER): Payer: BLUE CROSS/BLUE SHIELD | Admitting: Family

## 2017-03-10 ENCOUNTER — Encounter: Payer: Self-pay | Admitting: Family

## 2017-03-10 VITALS — BP 137/94 | HR 83 | Temp 98.2°F | Resp 16 | Ht 64.0 in | Wt 221.0 lb

## 2017-03-10 DIAGNOSIS — F418 Other specified anxiety disorders: Secondary | ICD-10-CM

## 2017-03-10 DIAGNOSIS — I10 Essential (primary) hypertension: Secondary | ICD-10-CM

## 2017-03-10 MED ORDER — DULOXETINE HCL 30 MG PO CPEP: ORAL_CAPSULE | ORAL | 0 refills | Status: DC

## 2017-03-10 MED ORDER — VENLAFAXINE HCL ER 37.5 MG PO CP24: ORAL_CAPSULE | ORAL | 0 refills | Status: DC

## 2017-03-10 MED ORDER — METOPROLOL SUCCINATE ER 100 MG PO TB24: ORAL_TABLET | ORAL | 2 refills | Status: DC

## 2017-03-10 NOTE — Progress Notes (Signed)
Subjective:     Patient ID: Nicole Quinn, female   DOB: Jan 16, 1976, 42 y.o.   MRN: [id number]  HPI   HTN- maintained on clonidine, lasix (nearly every day), hctz, toprol xl. Continues to have some swelling in her legs.   BP Readings from Last 3 Encounters:  03/10/17 (!) 137/94  12/15/16 (!) 128/92  10/14/16 (!) 150/90   Depression- mood is "yucky." reports that life is going well. No unusual stressors.  Denies SI but notes some hopelessness. No suicidal plan. Continues effexor. Also reports some anxiety symptoms.   Review of Systems    see HPI  Past Medical History:  Diagnosis Date  . Allergy   . Anemia   . Endometriosis   . History of echocardiogram    Echo 6/18: EF 60-65, no RWMA, normal diastolic function  . History of nuclear stress test    Myoview 6/18: EF 60, normal perfusion, Low Risk  . History of UTI   . Hypertension    pregnancy induced HTN/pre-eclampsia //  on meds since 2nd child (2000)     Social History   Socioeconomic History  . Marital status: Married    Spouse name: Not on file  . Number of children: 2  . Years of education: Not on file  . Highest education level: Not on file  Social Needs  . Financial resource strain: Not on file  . Food insecurity - worry: Not on file  . Food insecurity - inability: Not on file  . Transportation needs - medical: Not on file  . 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 Smoking status: Never Smoker  . Smokeless tobacco: Never Used  Substance and Sexual Activity  . Alcohol use: No    Alcohol/week: 0.0 oz  . Drug use: No  . Sexual activity: Not on file  Other Topics Concern  . Not on file  Social History Narrative   Regular exercise: yes.   Married.  Lives with one child.  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         Past Surgical History:  Procedure Laterality  Date  . ABDOMINAL HYSTERECTOMY  2003   partial  . CARPAL TUNNEL RELEASE     right  . GANGLION CYST EXCISION     right  . Hammer Toe Repair Bilateral 12/29/2013   Lt #3, #4, Rt #2.    Family History  Problem Relation Age of Onset  . Allergies Daughter   . Allergic rhinitis Daughter   . Allergies Son   . Allergic rhinitis Son   . Leukemia Paternal Grandmother   . Arthritis Mother   . Hyperlipidemia Mother   . Hypertension Mother   . Stroke Mother   . Allergic rhinitis Mother   . Hypertension Father   . Diabetes Father   . Arthritis Maternal Grandmother   . Stroke Maternal Grandmother 50  . Hypertension Maternal Grandmother   . Heart attack Maternal Grandmother 50  . Coronary artery disease Maternal Aunt 50  . Coronary artery disease Maternal Aunt 50  . Angioedema Neg Hx   . Asthma Neg Hx   . Eczema Neg Hx   . Immunodeficiency Neg Hx   . Urticaria Neg Hx     Allergies  Allergen Reactions  . Amoxicillin     rash  . Ace Inhibitors     REACTION: lip  swelling    Current Outpatient Medications on File Prior to Visit  Medication Sig Dispense Refill  . ciclopirox (PENLAC) 8 % solution Apply topically at bedtime. Apply over nail and surrounding skin. Apply daily over previous coat. After seven (7) days, may remove with alcohol and continue cycle. 6.6 mL 3  . cloNIDine (CATAPRES) 0.1 MG tablet TAKE 1 TABLET TWICE A DAY 60 tablet 5  . fluticasone (FLONASE) 50 MCG/ACT nasal spray Place 2 sprays into both nostrils daily as needed for allergies or rhinitis. 18.2 g 5  . furosemide (LASIX) 20 MG tablet Take 1 tablet (20 mg total) by mouth daily as needed. 90 tablet 0  . hydrochlorothiazide (HYDRODIURIL) 25 MG tablet TAKE 1 TABLET BY MOUTH ONCE DAILY 30 tablet 5  . levocetirizine (XYZAL) 5 MG tablet Take 1 tablet (5 mg total) by mouth every evening. 30 tablet 5  . metoprolol succinate (TOPROL XL) 100 MG 24 hr tablet Take 1 tablet (100 mg total) by mouth daily. Take with or  immediately following a meal. 30 tablet 2  . Multiple Vitamin (MULTIVITAMIN) tablet Take 1 tablet by mouth daily.      Marland Kitchen olopatadine (PATANOL) 0.1 % ophthalmic solution Place 1 drop into both eyes 2 (two) times daily as needed for allergies. 5 mL 5  . potassium chloride SA (K-DUR,KLOR-CON) 20 MEQ tablet Take 1 tablet (20 mEq total) by mouth 2 (two) times daily. 30 tablet 3  . venlafaxine XR (EFFEXOR XR) 75 MG 24 hr capsule Take 1 capsule (75 mg total) by mouth daily with breakfast. 30 capsule 0   No current facility-administered medications on file prior to visit.     BP (!) 137/94 (BP Location: Left Arm, Patient Position: Sitting, Cuff Size: Large)   Pulse 83   Temp 98.2 F (36.8 C) (Oral)   Resp 16   Ht 5\' 4"  (1.626 m)   Wt 221 lb (100.2 kg)   SpO2 100%   BMI 37.93 kg/m   c Objective:   Physical Exam  Constitutional: She is oriented to person, place, and time. She appears well-developed and well-nourished.  Cardiovascular: Normal rate, regular rhythm and normal heart sounds.  No murmur heard. Pulmonary/Chest: Effort normal and breath sounds normal. No respiratory distress. She has no wheezes.  Neurological: She is alert and oriented to person, place, and time.  Psychiatric: Her behavior is normal. Judgment and thought content normal.  Flat affect       Assessment:     Depression/anxiety- uncontrolled. Advised patient as follows:  Decrease effexor to 37.5mg  once daily for 1 week, then stop. Start cymbalta 30mg  once daily for 1 week (1 tab) then increase to 60mg  (2 tabs) once daily. She understands to got to the ER/call 911 if she develops thoughts of hurting self/others.   HTN- advised pt to increase toprol xl from 100mg  to 150mg  daily. Continue other BP meds.     Plan:

## 2017-03-10 NOTE — Patient Instructions (Signed)
Decrease effexor to 37.5mg  once daily for 1 week, then stop. Start cymbalta 30mg  once daily for 1 week (1 tab) then increase to 60mg  (2 tabs) once daily.

## 2017-03-17 DIAGNOSIS — M546 Pain in thoracic spine: Secondary | ICD-10-CM | POA: Diagnosis not present

## 2017-03-17 DIAGNOSIS — M62830 Muscle spasm of back: Secondary | ICD-10-CM | POA: Diagnosis not present

## 2017-03-17 DIAGNOSIS — M545 Low back pain: Secondary | ICD-10-CM | POA: Diagnosis not present

## 2017-03-17 DIAGNOSIS — M9903 Segmental and somatic dysfunction of lumbar region: Secondary | ICD-10-CM | POA: Diagnosis not present

## 2017-03-31 DIAGNOSIS — M546 Pain in thoracic spine: Secondary | ICD-10-CM | POA: Diagnosis not present

## 2017-03-31 DIAGNOSIS — M62830 Muscle spasm of back: Secondary | ICD-10-CM | POA: Diagnosis not present

## 2017-03-31 DIAGNOSIS — M545 Low back pain: Secondary | ICD-10-CM | POA: Diagnosis not present

## 2017-03-31 DIAGNOSIS — M9903 Segmental and somatic dysfunction of lumbar region: Secondary | ICD-10-CM | POA: Diagnosis not present

## 2017-04-09 ENCOUNTER — Encounter: Payer: Self-pay | Admitting: Family

## 2017-04-09 DIAGNOSIS — E876 Hypokalemia: Secondary | ICD-10-CM

## 2017-04-10 ENCOUNTER — Telehealth: Payer: Self-pay | Admitting: Family

## 2017-04-10 NOTE — Telephone Encounter (Signed)
I spoke to patient. Advised her to go to the ER for further evaluation. She is agreeable.

## 2017-04-11 DIAGNOSIS — E871 Hypo-osmolality and hyponatremia: Secondary | ICD-10-CM | POA: Diagnosis not present

## 2017-04-11 DIAGNOSIS — R55 Syncope and collapse: Secondary | ICD-10-CM | POA: Diagnosis not present

## 2017-04-11 DIAGNOSIS — S069X9A Unspecified intracranial injury with loss of consciousness of unspecified duration, initial encounter: Secondary | ICD-10-CM | POA: Diagnosis not present

## 2017-04-11 DIAGNOSIS — R918 Other nonspecific abnormal finding of lung field: Secondary | ICD-10-CM | POA: Diagnosis not present

## 2017-04-11 DIAGNOSIS — L559 Sunburn, unspecified: Secondary | ICD-10-CM | POA: Diagnosis not present

## 2017-04-11 DIAGNOSIS — W1812XA Fall from or off toilet with subsequent striking against object, initial encounter: Secondary | ICD-10-CM | POA: Diagnosis not present

## 2017-04-11 DIAGNOSIS — S0990XA Unspecified injury of head, initial encounter: Secondary | ICD-10-CM | POA: Diagnosis not present

## 2017-04-11 DIAGNOSIS — R42 Dizziness and giddiness: Secondary | ICD-10-CM | POA: Diagnosis not present

## 2017-04-11 DIAGNOSIS — Y92002 Bathroom of unspecified non-institutional (private) residence single-family (private) house as the place of occurrence of the external cause: Secondary | ICD-10-CM | POA: Diagnosis not present

## 2017-04-11 DIAGNOSIS — E876 Hypokalemia: Secondary | ICD-10-CM | POA: Diagnosis not present

## 2017-04-12 DIAGNOSIS — R9431 Abnormal electrocardiogram [ECG] [EKG]: Secondary | ICD-10-CM | POA: Diagnosis not present

## 2017-04-12 DIAGNOSIS — I517 Cardiomegaly: Secondary | ICD-10-CM | POA: Diagnosis not present

## 2017-04-15 ENCOUNTER — Encounter: Payer: Self-pay | Admitting: Family

## 2017-04-15 NOTE — Telephone Encounter (Signed)
Left voicemail and sent mychart message.  Appt scheduled for tomorrow at 7:20am.

## 2017-04-15 NOTE — Telephone Encounter (Signed)
Please contact pt to schedule OV this week with me. We will do labs at this visit.

## 2017-04-16 ENCOUNTER — Ambulatory Visit (INDEPENDENT_AMBULATORY_CARE_PROVIDER_SITE_OTHER): Payer: BLUE CROSS/BLUE SHIELD | Admitting: Family

## 2017-04-16 ENCOUNTER — Encounter: Payer: Self-pay | Admitting: Family

## 2017-04-16 ENCOUNTER — Telehealth: Payer: Self-pay | Admitting: Family

## 2017-04-16 VITALS — BP 138/95 | HR 88 | Temp 98.5°F | Resp 16 | Ht 64.0 in | Wt 221.8 lb

## 2017-04-16 DIAGNOSIS — R55 Syncope and collapse: Secondary | ICD-10-CM

## 2017-04-16 DIAGNOSIS — E876 Hypokalemia: Secondary | ICD-10-CM

## 2017-04-16 LAB — BASIC METABOLIC PANEL
BUN: 8 mg/dL (ref 6–23)
CO2: 33 mEq/L — ABNORMAL HIGH (ref 19–32)
Calcium: 9.1 mg/dL (ref 8.4–10.5)
Chloride: 97 mEq/L (ref 96–112)
Creatinine, Ser: 0.81 mg/dL (ref 0.40–1.20)
GFR: 100.66 mL/min (ref >60.00)
Glucose, Bld: 98 mg/dL (ref 70–99)
Potassium: 3 mEq/L — ABNORMAL LOW (ref 3.5–5.1)
Sodium: 138 mEq/L (ref 135–145)

## 2017-04-16 MED ORDER — POTASSIUM CHLORIDE CRYS ER 20 MEQ PO TBCR: 20.0000 meq | EXTENDED_RELEASE_TABLET | Freq: Every day | ORAL | 3 refills | Status: DC

## 2017-04-16 NOTE — Progress Notes (Signed)
Subjective:    Patient ID: Nicole Quinn, female    DOB: 1975-04-19, 42 y.o.   MRN: [id number]  HPI   Patient is a 42 year old female who presents today for ER follow-up.  ER record is reviewed in care everywhere.  She presented to the ER on 04/11/2017.  She apparently had some suprapubic abdominal pain which was followed by a syncopal episode  on 04/10/2017.  Blood pressure was elevated in the ED at 159/108.  A CT angios was performed which was negative for PE.  Some findings suggestive of small airway disease were noted.  Her potassium was noted to be low and this was repleted in the emergency department.  She was advised to hold her Lasix until she followed back up with [place] in the office. She was given kdur 42meq bid x 5 days.  She reports that she still has some mild dizziness/nausea. Denies any further issues of passing out. She reports that her LE edema has worsened since she stopped the lasix. She continues hydrochlorothiazide. She reports she had palpitations the day she went to the ER.  These have lessened but not resolved.     Review of Systems See HPI  Past Medical History:  Diagnosis Date  . Allergy   . Anemia   . Endometriosis   . History of echocardiogram    Echo 6/18: EF 60-65, no RWMA, normal diastolic function  . History of nuclear stress test    Myoview 6/18: EF 60, normal perfusion, Low Risk  . History of UTI   . Hypertension    pregnancy induced HTN/pre-eclampsia //  on meds since 2nd child (2000)     Social History   Socioeconomic History  . Marital status: Married    Spouse name: Not on file  . Number of children: 2  . Years of education: Not on file  . Highest education level: Not on file  Occupational History    Employer: [employer]  . Financial resource strain: Not on file  . Food insecurity:    Worry: Not on file    Inability: Not on file  . Transportation needs:      Medical: Not on file    Non-medical: Not on file  Tobacco Use  . Smoking status: Never Smoker  . Smokeless tobacco: Never Used  Substance and Sexual Activity  . Alcohol use: No    Alcohol/week: 0.0 oz  . Drug use: No  . Sexual activity: Not on file  Lifestyle  . Physical activity:    Days per week: Not on file    Minutes per session: Not on file  . Stress: Not on file  Relationships  . Social connections:    Talks on phone: Not on file    Gets together: Not on file    Attends religious service: Not on file    Active member of club or organization: Not on file    Attends meetings of clubs or organizations: Not on file    Relationship status: Not on file  . Intimate partner violence:    Fear of current or ex partner: Not on file    Emotionally abused: Not on file    Physically abused: Not on file    Forced sexual activity: Not on file  Other Topics Concern  . Not on file  Social History Narrative   Regular exercise: yes.   Married.  Lives with one child.  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         Past Surgical History:  Procedure Laterality Date  . ABDOMINAL HYSTERECTOMY  2003   partial  . CARPAL TUNNEL RELEASE     right  . GANGLION CYST EXCISION     right  . Hammer Toe Repair Bilateral 12/29/2013   Lt #3, #4, Rt #2.    Family History  Problem Relation Age of Onset  . Allergies Daughter   . Allergic rhinitis Daughter   . Allergies Son   . Allergic rhinitis Son   . Leukemia Paternal Grandmother   . Arthritis Mother   . Hyperlipidemia Mother   . Hypertension Mother   . Stroke Mother   . Allergic rhinitis Mother   . Hypertension Father   . Diabetes Father   . Arthritis Maternal Grandmother   . Stroke Maternal Grandmother 50  . Hypertension Maternal Grandmother   . Heart attack Maternal Grandmother 50  . Coronary artery disease Maternal Aunt 50  . Coronary artery disease Maternal Aunt 50  . Angioedema Neg Hx   . Asthma Neg Hx   . Eczema Neg  Hx   . Immunodeficiency Neg Hx   . Urticaria Neg Hx     Allergies  Allergen Reactions  . Amoxicillin     rash  . Ace Inhibitors     REACTION: lip swelling    Current Outpatient Medications on File Prior to Visit  Medication Sig Dispense Refill  . ciclopirox (PENLAC) 8 % solution Apply topically at bedtime. Apply over nail and surrounding skin. Apply daily over previous coat. After seven (7) days, may remove with alcohol and continue cycle. 6.6 mL 3  . cloNIDine (CATAPRES) 0.1 MG tablet TAKE 1 TABLET TWICE A DAY 60 tablet 5  . DULoxetine (CYMBALTA) 30 MG capsule 1 tablet by mouth once daily for 1 week, then increase to 2 tabs once daily on week two 60 capsule 0  . fluticasone (FLONASE) 50 MCG/ACT nasal spray Place 2 sprays into both nostrils daily as needed for allergies or rhinitis. 18.2 g 5  . furosemide (LASIX) 20 MG tablet Take 1 tablet (20 mg total) by mouth daily as needed. 90 tablet 0  . hydrochlorothiazide (HYDRODIURIL) 25 MG tablet TAKE 1 TABLET BY MOUTH ONCE DAILY 30 tablet 5  . levocetirizine (XYZAL) 5 MG tablet Take 1 tablet (5 mg total) by mouth every evening. 30 tablet 5  . metoprolol succinate (TOPROL XL) 100 MG 24 hr tablet Take 1.5 tablets by mouth once daily. 45 tablet 2  . Multiple Vitamin (MULTIVITAMIN) tablet Take 1 tablet by mouth daily.      Marland Kitchen olopatadine (PATANOL) 0.1 % ophthalmic solution Place 1 drop into both eyes 2 (two) times daily as needed for allergies. 5 mL 5  . potassium chloride SA (K-DUR,KLOR-CON) 20 MEQ tablet Take 1 tablet (20 mEq total) by mouth 2 (two) times daily. 30 tablet 3  . venlafaxine XR (EFFEXOR XR) 37.5 MG 24 hr capsule 1 tab by mouth once daily for 1 week then stop 7 capsule 0   No current facility-administered medications on file prior to visit.     BP (!) 138/95 (BP Location: Right Arm, Patient Position: Sitting, Cuff Size: Small)   Pulse 88   Temp 98.5 F (36.9 C) (Oral)   Resp 16   Ht 5\' 4"  (1.626 m)   Wt 221 lb 12.8 oz (100.6  kg)   SpO2 99%   BMI 38.07 kg/m  Objective:   Physical Exam  Constitutional: She is oriented to person, place, and time. She appears well-developed and well-nourished.  Cardiovascular: Normal rate, regular rhythm and normal heart sounds.  No murmur heard. Pulmonary/Chest: Effort normal and breath sounds normal. No respiratory distress. She has no wheezes.  Musculoskeletal:  Trace bilateral lower extremity edema is noted  Neurological: She is alert and oriented to person, place, and time.  Psychiatric: She has a normal mood and affect. Her behavior is normal. Judgment and thought content normal.          Assessment & Plan:  Syncope-occurred in setting of severe hypokalemia.  This has been repleted orally and the IV in the emergency department.  I have advised the patient to remain off of Lasix for now.  Continue hydrochlorothiazide as well as once daily K-Dur 20 milliequivalents.  Will obtain follow-up basic metabolic panel to assess electrolytes.  She is advised to follow-up in 1 month and to return to the emergency room should she have recurrent syncope.  Patient verbalizes understanding.

## 2017-04-16 NOTE — Patient Instructions (Signed)
Continue kdur 41meq once daily.  Remain off of lasix. Complete lab work prior to leaving. Return to the ER if you develop recurrent fainting episode.

## 2017-04-16 NOTE — Telephone Encounter (Signed)
Called patient with results. Scheduled lab appointment for Monday.

## 2017-04-16 NOTE — Telephone Encounter (Signed)
Please contact pt and let her know that potassium is still low (3.0).  I would like her to take 2 tabs now, then 2 tabs in 4 hours, then start one tab once daily, repeat bmet in on Monday.

## 2017-04-17 DIAGNOSIS — R55 Syncope and collapse: Secondary | ICD-10-CM | POA: Diagnosis not present

## 2017-04-17 DIAGNOSIS — Z7689 Persons encountering health services in other specified circumstances: Secondary | ICD-10-CM | POA: Diagnosis not present

## 2017-04-20 ENCOUNTER — Other Ambulatory Visit (INDEPENDENT_AMBULATORY_CARE_PROVIDER_SITE_OTHER): Payer: BLUE CROSS/BLUE SHIELD

## 2017-04-20 ENCOUNTER — Telehealth: Payer: Self-pay | Admitting: Emergency Medicine

## 2017-04-20 DIAGNOSIS — E876 Hypokalemia: Secondary | ICD-10-CM | POA: Diagnosis not present

## 2017-04-20 LAB — BASIC METABOLIC PANEL
BUN: 14 mg/dL (ref 6–23)
CO2: 36 mEq/L — ABNORMAL HIGH (ref 19–32)
Calcium: 9.7 mg/dL (ref 8.4–10.5)
Chloride: 91 mEq/L — ABNORMAL LOW (ref 96–112)
Creatinine, Ser: 0.94 mg/dL (ref 0.40–1.20)
GFR: 84.77 mL/min (ref >60.00)
Glucose, Bld: 101 mg/dL — ABNORMAL HIGH (ref 70–99)
Potassium: 2.6 mEq/L — CL (ref 3.5–5.1)
Sodium: 138 mEq/L (ref 135–145)

## 2017-04-20 NOTE — Telephone Encounter (Signed)
"  CRITICAL VALUE STICKER  CRITICAL VALUE:Potassium 2.7  RECEIVER (on-site recipient of call):KristyP.  DATE & TIME NOTIFIED: 04-20-17 3:14pm  MESSENGER (representative from lab):Hope  MD NOTIFIED: Edwena Blow  TIME OF NOTIFICATION:3:19pm  RESPONSE: will contact patient

## 2017-04-20 NOTE — Telephone Encounter (Signed)
Notified pt and she voices understanding. Lab appt scheduled for 04/22/17 at 7:25am and future order has been entered.

## 2017-04-20 NOTE — Telephone Encounter (Signed)
Please call pt and let her know that her potassium is still very low. I think her fluid pill is contributing. She snould stop hctz and remain off of furosemide.    Kdur 14meq now, tonight and in AM, then take 1 tab twice daily.   Repeat bmet on Wednesday.  Dx hypokalemia.

## 2017-04-21 ENCOUNTER — Telehealth: Payer: Self-pay | Admitting: *Deleted

## 2017-04-21 MED ORDER — METOPROLOL SUCCINATE ER 100 MG PO TB24: ORAL_TABLET | ORAL | 1 refills | Status: DC

## 2017-04-21 NOTE — Telephone Encounter (Signed)
Received request from [facility] for metoprolol ER 100mg . Refills sent.

## 2017-04-22 ENCOUNTER — Other Ambulatory Visit (INDEPENDENT_AMBULATORY_CARE_PROVIDER_SITE_OTHER): Payer: BLUE CROSS/BLUE SHIELD

## 2017-04-22 ENCOUNTER — Telehealth: Payer: Self-pay | Admitting: Family

## 2017-04-22 DIAGNOSIS — E876 Hypokalemia: Secondary | ICD-10-CM

## 2017-04-22 LAB — BASIC METABOLIC PANEL
BUN: 11 mg/dL (ref 6–23)
CO2: 32 mEq/L (ref 19–32)
Calcium: 9 mg/dL (ref 8.4–10.5)
Chloride: 96 mEq/L (ref 96–112)
Creatinine, Ser: 0.78 mg/dL (ref 0.40–1.20)
GFR: 105.13 mL/min (ref >60.00)
Glucose, Bld: 83 mg/dL (ref 70–99)
Potassium: 2.9 mEq/L — ABNORMAL LOW (ref 3.5–5.1)
Sodium: 138 mEq/L (ref 135–145)

## 2017-04-22 NOTE — Telephone Encounter (Signed)
Notified pt and she voices understanding. Lab appt scheduled for 04/24/17 at 7:15am. Future lab orders entered.

## 2017-04-22 NOTE — Telephone Encounter (Signed)
Potassium is slowly improving.   Take kdur 2 tabs tonight, 2 tabs  tomorrow AM and PM  Repeat BMET with magnesium on Friday AM, dx hypokalemia.

## 2017-04-24 ENCOUNTER — Telehealth: Payer: Self-pay | Admitting: *Deleted

## 2017-04-24 ENCOUNTER — Other Ambulatory Visit (INDEPENDENT_AMBULATORY_CARE_PROVIDER_SITE_OTHER): Payer: BLUE CROSS/BLUE SHIELD

## 2017-04-24 DIAGNOSIS — E876 Hypokalemia: Secondary | ICD-10-CM

## 2017-04-24 LAB — MAGNESIUM: Magnesium: 1.8 mg/dL (ref 1.5–2.5)

## 2017-04-24 LAB — BASIC METABOLIC PANEL
BUN: 11 mg/dL (ref 6–23)
CO2: 33 mEq/L — ABNORMAL HIGH (ref 19–32)
Calcium: 9.5 mg/dL (ref 8.4–10.5)
Chloride: 96 mEq/L (ref 96–112)
Creatinine, Ser: 0.8 mg/dL (ref 0.40–1.20)
GFR: 102.1 mL/min (ref >60.00)
Glucose, Bld: 82 mg/dL (ref 70–99)
Potassium: 2.7 mEq/L — CL (ref 3.5–5.1)
Sodium: 137 mEq/L (ref 135–145)

## 2017-04-24 MED ORDER — POTASSIUM CHLORIDE CRYS ER 20 MEQ PO TBCR: 40.0000 meq | EXTENDED_RELEASE_TABLET | Freq: Three times a day (TID) | ORAL | 3 refills | Status: DC

## 2017-04-24 NOTE — Telephone Encounter (Signed)
Left message on cell to check mychart.  

## 2017-04-24 NOTE — Telephone Encounter (Signed)
CRITICAL VALUE STICKER  CRITICAL VALUE:K+:2.7  RECEIVER (on-site recipient of call):Angie  DATE & TIME NOTIFIED:04/24/17 @ 1:39pm   MESSENGER (representative from lab):Humphrey Rolls  MD NOTIFIED:Melissa Inda Castle, NP   TIME OF NOTIFICATION:1:45pm  RESPONSE:Was asked to send a message to [facility].

## 2017-04-25 ENCOUNTER — Telehealth: Payer: Self-pay | Admitting: Family

## 2017-04-25 MED ORDER — SPIRONOLACTONE 25 MG PO TABS: 25.0000 mg | ORAL_TABLET | Freq: Every day | ORAL | 5 refills | Status: DC

## 2017-04-25 NOTE — Telephone Encounter (Signed)
See mychart.  

## 2017-04-27 ENCOUNTER — Telehealth: Payer: Self-pay | Admitting: Emergency Medicine

## 2017-04-27 ENCOUNTER — Telehealth: Payer: Self-pay | Admitting: Family

## 2017-04-27 ENCOUNTER — Other Ambulatory Visit (INDEPENDENT_AMBULATORY_CARE_PROVIDER_SITE_OTHER): Payer: BLUE CROSS/BLUE SHIELD

## 2017-04-27 DIAGNOSIS — E876 Hypokalemia: Secondary | ICD-10-CM

## 2017-04-27 LAB — BASIC METABOLIC PANEL
BUN: 10 mg/dL (ref 6–23)
CO2: 35 mEq/L — ABNORMAL HIGH (ref 19–32)
Calcium: 9.5 mg/dL (ref 8.4–10.5)
Chloride: 93 mEq/L — ABNORMAL LOW (ref 96–112)
Creatinine, Ser: 0.93 mg/dL (ref 0.40–1.20)
GFR: 85.81 mL/min (ref >60.00)
Glucose, Bld: 87 mg/dL (ref 70–99)
Potassium: 2.7 mEq/L — CL (ref 3.5–5.1)
Sodium: 138 mEq/L (ref 135–145)

## 2017-04-27 LAB — MAGNESIUM: Magnesium: 1.8 mg/dL (ref 1.5–2.5)

## 2017-04-27 NOTE — Telephone Encounter (Signed)
Reviewed

## 2017-04-27 NOTE — Telephone Encounter (Signed)
Attempted to reach pt. Left message to check mychart acct. Message sent.

## 2017-04-27 NOTE — Telephone Encounter (Signed)
"  CRITICAL VALUE STICKER  CRITICAL VALUE:Potassium 2.7  RECEIVER (on-site recipient of call):Allie Ousley P.  DATE & TIME NOTIFIED: 1325 04-27-17  MESSENGER (representative from lab):Hope MD NOTIFIED:   TIME OF NOTIFICATION:1326  RESPONSE: Fax add on to [facility] for STAT Magnesium, Faxed, confirmation received.

## 2017-04-27 NOTE — Telephone Encounter (Signed)
K+ still 2.7.  Please advised pt as follows:  Ask pt to increase kdur to 69mEQ 4 times daily. Repeat bmet on 4/10.  I am also going to refer her to nephrology.

## 2017-04-28 ENCOUNTER — Telehealth: Payer: Self-pay | Admitting: *Deleted

## 2017-04-28 MED ORDER — POTASSIUM CHLORIDE 20 MEQ/15ML (10%) PO SOLN: 40.0000 meq | Freq: Four times a day (QID) | ORAL | 2 refills | Status: DC

## 2017-04-28 NOTE — Telephone Encounter (Signed)
Received fax from pharmacy wanting to verify the accuracy of Potassium directions due to "high dosage". Advised pharmacist that dose has been increased to 2 tablets four times daily. She also states that liquid potassium Rx will cost pt $1400. She states pt's current tablets are dissolvable in water and she will speak to pt about that alternative as well.

## 2017-04-28 NOTE — Addendum Note (Signed)
Addended by: Debbrah Alar on: 04/28/2017 08:56 AM   Modules accepted: Orders

## 2017-04-29 ENCOUNTER — Encounter (HOSPITAL_BASED_OUTPATIENT_CLINIC_OR_DEPARTMENT_OTHER): Payer: Self-pay | Admitting: *Deleted

## 2017-04-29 ENCOUNTER — Telehealth: Payer: Self-pay | Admitting: *Deleted

## 2017-04-29 ENCOUNTER — Emergency Department (HOSPITAL_BASED_OUTPATIENT_CLINIC_OR_DEPARTMENT_OTHER)
Admission: EM | Admit: 2017-04-29 | Discharge: 2017-04-30 | Disposition: A | Discharge status: Home / Self Care | Payer: BLUE CROSS/BLUE SHIELD | Attending: Emergency Medicine | Admitting: Emergency Medicine

## 2017-04-29 ENCOUNTER — Other Ambulatory Visit: Payer: Self-pay

## 2017-04-29 ENCOUNTER — Other Ambulatory Visit (INDEPENDENT_AMBULATORY_CARE_PROVIDER_SITE_OTHER): Payer: BLUE CROSS/BLUE SHIELD

## 2017-04-29 DIAGNOSIS — F329 Major depressive disorder, single episode, unspecified: Secondary | ICD-10-CM | POA: Insufficient documentation

## 2017-04-29 DIAGNOSIS — I10 Essential (primary) hypertension: Secondary | ICD-10-CM | POA: Diagnosis not present

## 2017-04-29 DIAGNOSIS — F419 Anxiety disorder, unspecified: Secondary | ICD-10-CM | POA: Insufficient documentation

## 2017-04-29 DIAGNOSIS — E876 Hypokalemia: Secondary | ICD-10-CM | POA: Insufficient documentation

## 2017-04-29 DIAGNOSIS — Z79899 Other long term (current) drug therapy: Secondary | ICD-10-CM | POA: Insufficient documentation

## 2017-04-29 LAB — CBC WITH DIFFERENTIAL/PLATELET
Basophils Absolute: 0.1 10*3/uL (ref 0.0–0.1)
Basophils Relative: 1 %
Eosinophils Absolute: 0.1 10*3/uL (ref 0.0–0.7)
Eosinophils Relative: 2 %
HCT: 35.8 % — ABNORMAL LOW (ref 36.0–46.0)
Hemoglobin: 12.9 g/dL (ref 12.0–15.0)
Lymphocytes Relative: 46 %
Lymphs Abs: 3.9 10*3/uL (ref 0.7–4.0)
MCH: 32.3 pg (ref 26.0–34.0)
MCHC: 36 g/dL (ref 30.0–36.0)
MCV: 89.5 fL (ref 78.0–100.0)
Monocytes Absolute: 0.6 10*3/uL (ref 0.1–1.0)
Monocytes Relative: 8 %
Neutro Abs: 3.5 10*3/uL (ref 1.7–7.7)
Neutrophils Relative %: 43 %
Platelets: 365 10*3/uL (ref 150–400)
RBC: 4 MIL/uL (ref 3.87–5.11)
RDW: 13 % (ref 11.5–15.5)
WBC: 8.1 10*3/uL (ref 4.0–10.5)

## 2017-04-29 LAB — BASIC METABOLIC PANEL
Anion gap: 9 (ref 5–15)
Anion gap: 10 (ref 5–15)
BUN: 9 mg/dL (ref 6–23)
BUN: 9 mg/dL (ref 6–20)
BUN: 8 mg/dL (ref 6–20)
CO2: 32 mEq/L (ref 19–32)
CO2: 26 mmol/L (ref 22–32)
CO2: 26 mmol/L (ref 22–32)
Calcium: 9.4 mg/dL (ref 8.4–10.5)
Calcium: 9.2 mg/dL (ref 8.9–10.3)
Calcium: 9.1 mg/dL (ref 8.9–10.3)
Chloride: 98 mEq/L (ref 96–112)
Chloride: 99 mmol/L — ABNORMAL LOW (ref 101–111)
Chloride: 98 mmol/L — ABNORMAL LOW (ref 101–111)
Creatinine, Ser: 0.74 mg/dL (ref 0.40–1.20)
Creatinine, Ser: 0.72 mg/dL (ref 0.44–1.00)
Creatinine, Ser: 0.64 mg/dL (ref 0.44–1.00)
GFR calc Af Amer: >60 mL/min (ref >60)
GFR calc Af Amer: >60 mL/min (ref >60)
GFR calc non Af Amer: >60 mL/min (ref >60)
GFR calc non Af Amer: >60 mL/min (ref >60)
GFR: 111.7 mL/min (ref >60.00)
Glucose, Bld: 82 mg/dL (ref 70–99)
Glucose, Bld: 106 mg/dL — ABNORMAL HIGH (ref 65–99)
Glucose, Bld: 100 mg/dL — ABNORMAL HIGH (ref 65–99)
Potassium: 2.8 mEq/L — CL (ref 3.5–5.1)
Potassium: 2.7 mmol/L — CL (ref 3.5–5.1)
Potassium: 2.7 mmol/L — CL (ref 3.5–5.1)
Sodium: 139 mEq/L (ref 135–145)
Sodium: 134 mmol/L — ABNORMAL LOW (ref 135–145)
Sodium: 134 mmol/L — ABNORMAL LOW (ref 135–145)

## 2017-04-29 LAB — MAGNESIUM: Magnesium: 2 mg/dL (ref 1.7–2.4)

## 2017-04-29 MED ORDER — POTASSIUM CHLORIDE 10 MEQ/100ML IV SOLN
10.0000 meq | INTRAVENOUS | Status: AC
  Administered 2017-04-29 – 2017-04-30 (×2): 10 meq via INTRAVENOUS
  Filled 2017-04-29 (×2): qty 100

## 2017-04-29 MED ORDER — POTASSIUM CHLORIDE 10 MEQ/100ML IV SOLN
10.0000 meq | INTRAVENOUS | Status: AC
  Administered 2017-04-29 (×2): 10 meq via INTRAVENOUS
  Filled 2017-04-29: qty 100

## 2017-04-29 NOTE — ED Notes (Signed)
Pt reports increased dizziness when ambulating, feeling like she is going to pass out. Pt assisted back to bed.

## 2017-04-29 NOTE — ED Notes (Signed)
Family at bedside. 

## 2017-04-29 NOTE — ED Notes (Signed)
Date and time results received: 04/29/17  2108  Test: potassium  Critical Value: 2.7  Name of Provider Notified: chris lawyer, PA  Orders Received? Or Actions Taken?: acknowledged.

## 2017-04-29 NOTE — ED Notes (Signed)
ED Provider at bedside. 

## 2017-04-29 NOTE — ED Notes (Addendum)
ED Provider at bedside. 

## 2017-04-29 NOTE — ED Notes (Signed)
Pt denies any complaint at this time. Pt updated with plan of care. No concerns. Family at bedside

## 2017-04-29 NOTE — Telephone Encounter (Signed)
Pt notified of below and will proceed to [facility] ED. Report called to Dr Avanell Shackleton.

## 2017-04-29 NOTE — ED Notes (Addendum)
IV potassium delayed due to pt wanting update from EDP

## 2017-04-29 NOTE — Telephone Encounter (Signed)
Reviewed and agree.

## 2017-04-29 NOTE — Telephone Encounter (Signed)
Noted  

## 2017-04-29 NOTE — ED Triage Notes (Signed)
Pt sent here from PMD office with K+ 2.8

## 2017-04-29 NOTE — Telephone Encounter (Signed)
CRITICAL VALUE STICKER  CRITICAL VALUE: Potassium  2.8  RECEIVER (on-site recipient of call):  DATE & TIME NOTIFIED: 04/29/17 1:38pm  MESSENGER (representative from lab): Santiago Glad  MD NOTIFIED: Debbrah Alar, NP  TIME OF NOTIFICATION:1:40pm  RESPONSE:  Per MD, pt should proceed to the ER for IVs as oral supplementation is not sufficient.

## 2017-04-29 NOTE — ED Notes (Signed)
Patient up assisted to bathroom; ambulatory without difficulty.

## 2017-04-29 NOTE — Telephone Encounter (Signed)
I was alerted to critical K level- called Debbrah Alar who is her PCP and who has been working with this issue. She was already aware and has sent pt to ER as she has not responded to high dose of oral K

## 2017-04-29 NOTE — Telephone Encounter (Signed)
Left message for pt to return my call.

## 2017-04-30 ENCOUNTER — Encounter: Payer: Self-pay | Admitting: Family

## 2017-04-30 ENCOUNTER — Inpatient Hospital Stay: Payer: BLUE CROSS/BLUE SHIELD | Admitting: Family

## 2017-04-30 LAB — BASIC METABOLIC PANEL
Anion gap: 9 (ref 5–15)
BUN: 8 mg/dL (ref 6–20)
CO2: 26 mmol/L (ref 22–32)
Calcium: 9 mg/dL (ref 8.9–10.3)
Chloride: 100 mmol/L — ABNORMAL LOW (ref 101–111)
Creatinine, Ser: 0.74 mg/dL (ref 0.44–1.00)
GFR calc Af Amer: >60 mL/min (ref >60)
GFR calc non Af Amer: >60 mL/min (ref >60)
Glucose, Bld: 100 mg/dL — ABNORMAL HIGH (ref 65–99)
Potassium: 2.9 mmol/L — ABNORMAL LOW (ref 3.5–5.1)
Sodium: 135 mmol/L (ref 135–145)

## 2017-04-30 LAB — POTASSIUM: Potassium: 3.8 mmol/L (ref 3.5–5.1)

## 2017-04-30 MED ORDER — POTASSIUM CHLORIDE 10 MEQ/100ML IV SOLN
INTRAVENOUS | Status: AC
  Filled 2017-04-30: qty 100

## 2017-04-30 MED ORDER — POTASSIUM CHLORIDE CRYS ER 20 MEQ PO TBCR
40.0000 meq | EXTENDED_RELEASE_TABLET | Freq: Once | ORAL | Status: AC
  Administered 2017-04-30: 40 meq via ORAL
  Filled 2017-04-30: qty 2

## 2017-04-30 MED ORDER — POTASSIUM CHLORIDE CRYS ER 20 MEQ PO TBCR
EXTENDED_RELEASE_TABLET | ORAL | Status: AC
  Filled 2017-04-30: qty 2

## 2017-04-30 MED ORDER — POTASSIUM CHLORIDE 20 MEQ/15ML (10%) PO SOLN
ORAL | Status: AC
  Filled 2017-04-30: qty 30

## 2017-04-30 NOTE — ED Notes (Signed)
Patient up the restroom; ambulatory without difficulty.

## 2017-04-30 NOTE — ED Notes (Signed)
K+ critical result of 2.9 called at 0230. This RN notified EDP. New orders for oral and IV potassium given- see paper charting. BMP to be redrawn after IV run.

## 2017-04-30 NOTE — ED Provider Notes (Addendum)
5:02 AM Potassium up to 3.8 after oral and IV repletion.  Patient was advised to contact her primary care physician later this morning to arrange for further evaluation and treatment.  The cause of her labile hypokalemia is unclear but could represent Bartter syndrome or other renal etiology.  She is currently taking 20 mEq of potassium chloride 4 times daily.     Lawyer Washabaugh, Jenny Reichmann, MD 04/30/17 0503    Shanon Rosser, MD 04/30/17 [phone]

## 2017-04-30 NOTE — ED Notes (Signed)
Please see downtime charting from [phone]

## 2017-04-30 NOTE — Telephone Encounter (Signed)
Called pt and scheduled a hospital follow up visit for 05/01/17 @ 11:20.

## 2017-04-30 NOTE — ED Provider Notes (Signed)
[facility] EMERGENCY DEPARTMENT Provider Note   CSN: [id number] Arrival date & time: 04/29/17  1555     History   Chief Complaint Chief Complaint  Patient presents with  . pot  2.8    HPI Nicole Quinn is a 42 y.o. female.  HPI Patient presents to the emergency department with low potassium.  The patient states is been ongoing issue over the last few weeks.  She states that she returned from a trip from [place] and noted to have low potassium.  The patient was seen at the [facility] ER and given potassium replacement and she states that her potassium got the 3.0 she is followed up with her doctor subsequently and noted to have potassiums in the upper twos.  Patient states that nothing seems to make the condition better or worse.  She states she has some slight dizziness but no other issues.  The patient denies chest pain, shortness of breath, headache,blurred vision, neck pain, fever, cough, weakness, numbness, anorexia, edema, abdominal pain, nausea, vomiting, diarrhea, rash, back pain, dysuria, hematemesis, bloody stool, near syncope, or syncope. Past Medical History:  Diagnosis Date  . Allergy   . Anemia   . Endometriosis   . History of echocardiogram    Echo 6/18: EF 60-65, no RWMA, normal diastolic function  . History of nuclear stress test    Myoview 6/18: EF 60, normal perfusion, Low Risk  . History of UTI   . Hypertension    pregnancy induced HTN/pre-eclampsia //  on meds since 2nd child (2000)    Patient Active Problem List   Diagnosis Date Noted  . Angioedema 06/26/2016  . Seasonal and perennial allergic rhinitis 06/26/2016  . Allergic conjunctivitis 06/26/2016  . Food allergy 06/26/2016  . Allergic urticaria 06/26/2016  . Depression 01/29/2016  . Left wrist pain 11/29/2015  . Obesity (BMI 30.0-34.9) 08/12/2013  . Other and unspecified hyperlipidemia 02/24/2013  . Chronic constipation 02/22/2013  . Routine general medical  examination at a health care facility 02/22/2013  . Anxiety associated with depression 10/10/2011  . Dyspnea 08/24/2011  . OTHER ACNE 06/04/2009  . HYPERGLYCEMIA, BORDERLINE 06/04/2009  . Essential hypertension 04/10/2009    Past Surgical History:  Procedure Laterality Date  . ABDOMINAL HYSTERECTOMY  2003   partial  . CARPAL TUNNEL RELEASE     right  . GANGLION CYST EXCISION     right  . Hammer Toe Repair Bilateral 12/29/2013   Lt #3, #4, Rt #2.     OB History   None      Home Medications    Prior to Admission medications   Medication Sig Start Date End Date Taking? Authorizing Provider  ciclopirox (PENLAC) 8 % solution Apply topically at bedtime. Apply over nail and surrounding skin. Apply daily over previous coat. After seven (7) days, may remove with alcohol and continue cycle. 10/07/16   Sheard, Myeong O, DPM  cloNIDine (CATAPRES) 0.1 MG tablet TAKE 1 TABLET TWICE A DAY 12/26/16   Debbrah Alar, NP  DULoxetine (CYMBALTA) 30 MG capsule 1 tablet by mouth once daily for 1 week, then increase to 2 tabs once daily on week two 03/10/17   Debbrah Alar, NP  fluticasone (FLONASE) 50 MCG/ACT nasal spray Place 2 sprays into both nostrils daily as needed for allergies or rhinitis. 06/26/16   Bobbitt, Sedalia Muta, MD  levocetirizine (XYZAL) 5 MG tablet Take 1 tablet (5 mg total) by mouth every evening. 06/26/16   Bobbitt, Sedalia Muta, MD  metoprolol succinate (TOPROL XL) 100 MG 24 hr tablet Take 1.5 tablets by mouth once daily. 04/21/17   Debbrah Alar, NP  Multiple Vitamin (MULTIVITAMIN) tablet Take 1 tablet by mouth daily.      [provider]  olopatadine (PATANOL) 0.1 % ophthalmic solution Place 1 drop into both eyes 2 (two) times daily as needed for allergies. 06/26/16   Bobbitt, Sedalia Muta, MD  potassium chloride 20 MEQ/15ML (10%) SOLN Take 30 mLs (40 mEq total) by mouth 4 (four) times daily. 04/28/17   Debbrah Alar, NP  potassium chloride SA  (K-DUR,KLOR-CON) 20 MEQ tablet Take 2 tablets (40 mEq total) by mouth 3 (three) times daily. 04/24/17   Debbrah Alar, NP  spironolactone (ALDACTONE) 25 MG tablet Take 1 tablet (25 mg total) by mouth daily. 04/25/17   Debbrah Alar, NP  venlafaxine XR (EFFEXOR XR) 37.5 MG 24 hr capsule 1 tab by mouth once daily for 1 week then stop 03/10/17   Debbrah Alar, NP    Family History Family History  Problem Relation Age of Onset  . Allergies Daughter   . Allergic rhinitis Daughter   . Allergies Son   . Allergic rhinitis Son   . Leukemia Paternal Grandmother   . Arthritis Mother   . Hyperlipidemia Mother   . Hypertension Mother   . Stroke Mother   . Allergic rhinitis Mother   . Hypertension Father   . Diabetes Father   . Arthritis Maternal Grandmother   . Stroke Maternal Grandmother 50  . Hypertension Maternal Grandmother   . Heart attack Maternal Grandmother 50  . Coronary artery disease Maternal Aunt 50  . Coronary artery disease Maternal Aunt 50  . Angioedema Neg Hx   . Asthma Neg Hx   . Eczema Neg Hx   . Immunodeficiency Neg Hx   . Urticaria Neg Hx     Social History Social History   Tobacco Use  . Smoking status: Never Smoker  . Smokeless tobacco: Never Used  Substance Use Topics  . Alcohol use: No    Alcohol/week: 0.0 oz  . Drug use: No     Allergies   Amoxicillin and Ace inhibitors   Review of Systems Review of Systems All other systems negative except as documented in the HPI. All pertinent positives and negatives as reviewed in the HPI.   Physical Exam Updated Vital Signs BP 117/84   Pulse 77   Temp 98.3 F (36.8 C) (Oral)   Resp (!) 22   Ht 5\' 4"  (1.626 m)   Wt 99.8 kg (220 lb)   SpO2 100%   BMI 37.76 kg/m   Physical Exam  Constitutional: She is oriented to person, place, and time. She appears well-developed and well-nourished. No distress.  HENT:  Head: Normocephalic and atraumatic.  Mouth/Throat: Oropharynx is clear and moist.    Eyes: Pupils are equal, round, and reactive to light.  Neck: Normal range of motion. Neck supple.  Cardiovascular: Normal rate, regular rhythm and normal heart sounds. Exam reveals no gallop and no friction rub.  No murmur heard. Pulmonary/Chest: Effort normal and breath sounds normal. No respiratory distress. She has no wheezes.  Abdominal: Soft. Bowel sounds are normal. She exhibits no distension. There is no tenderness.  Neurological: She is alert and oriented to person, place, and time. She exhibits normal muscle tone. Coordination normal.  Skin: Skin is warm and dry. Capillary refill takes less than 2 seconds. No rash noted. No erythema.  Psychiatric: She has a normal mood and  affect. Her behavior is normal.  Nursing note and vitals reviewed.    ED Treatments / Results  Labs (all labs ordered are listed, but only abnormal results are displayed) Labs Reviewed  CBC WITH DIFFERENTIAL/PLATELET - Abnormal; Notable for the following components:      Result Value   HCT 35.8 (*)    All other components within normal limits  BASIC METABOLIC PANEL - Abnormal; Notable for the following components:   Sodium 134 (*)    Potassium 2.7 (*)    Chloride 99 (*)    Glucose, Bld 106 (*)    All other components within normal limits  BASIC METABOLIC PANEL - Abnormal; Notable for the following components:   Sodium 134 (*)    Potassium 2.7 (*)    Chloride 98 (*)    Glucose, Bld 100 (*)    All other components within normal limits  MAGNESIUM    EKG EKG Interpretation  Date/Time:  Wednesday April 29 2017 17:26:33 EDT Ventricular Rate:  66 PR Interval:    QRS Duration: 92 QT Interval:  402 QTC Calculation: 422 R Axis:   55 Text Interpretation:  Sinus rhythm When compared to prior, no signifiant changes seen.  No STEMI Confirmed by Antony Blackbird [phone]) on 04/29/2017 7:11:38 PM   Radiology No results found.  Procedures Procedures (including critical care time)  Medications Ordered in  ED Medications  potassium chloride 10 mEq in 100 mL IVPB (10 mEq Intravenous New Bag/Given 04/29/17 2327)  potassium chloride 10 mEq in 100 mL IVPB (0 mEq Intravenous Stopped 04/29/17 1945)     Initial Impression / Assessment and Plan / ED Course  I have reviewed the triage vital signs and the nursing notes.  Pertinent labs & imaging results that were available during my care of the patient were reviewed by me and considered in my medical decision making (see chart for details).    Patient was given IV repletion of her potassium along with oral potassium.  The initial run did not yield any higher potassium.  The second run was initiated and Dr. Florina Ou will follow up on recheck of this.  I do feel the patient is stable and most likely be discharged home.  Final Clinical Impressions(s) / ED Diagnoses   Final diagnoses:  None    ED Discharge Orders    None       Dalia Heading, PA-C 05/03/17 1147    Tegeler, Gwenyth Allegra, MD 05/04/17 1029

## 2017-05-01 ENCOUNTER — Encounter: Payer: Self-pay | Admitting: Family

## 2017-05-01 ENCOUNTER — Encounter (HOSPITAL_COMMUNITY): Payer: Self-pay | Admitting: Emergency Medicine

## 2017-05-01 ENCOUNTER — Emergency Department (HOSPITAL_COMMUNITY)
Admission: EM | Admit: 2017-05-01 | Discharge: 2017-05-01 | Disposition: A | Discharge status: Home / Self Care | Payer: BLUE CROSS/BLUE SHIELD | Attending: Emergency Medicine | Admitting: Emergency Medicine

## 2017-05-01 ENCOUNTER — Telehealth: Payer: Self-pay | Admitting: Family

## 2017-05-01 ENCOUNTER — Other Ambulatory Visit: Payer: Self-pay

## 2017-05-01 ENCOUNTER — Ambulatory Visit (INDEPENDENT_AMBULATORY_CARE_PROVIDER_SITE_OTHER): Payer: BLUE CROSS/BLUE SHIELD | Admitting: Family

## 2017-05-01 VITALS — BP 130/90 | HR 86 | Temp 98.1°F | Resp 18 | Ht 64.0 in | Wt 218.4 lb

## 2017-05-01 DIAGNOSIS — Z79899 Other long term (current) drug therapy: Secondary | ICD-10-CM | POA: Diagnosis not present

## 2017-05-01 DIAGNOSIS — E876 Hypokalemia: Secondary | ICD-10-CM

## 2017-05-01 DIAGNOSIS — I10 Essential (primary) hypertension: Secondary | ICD-10-CM | POA: Diagnosis not present

## 2017-05-01 LAB — I-STAT CHEM 8, ED
BUN: 13 mg/dL (ref 6–20)
Calcium, Ion: 1.15 mmol/L (ref 1.15–1.40)
Chloride: 97 mmol/L — ABNORMAL LOW (ref 101–111)
Creatinine, Ser: 0.7 mg/dL (ref 0.44–1.00)
Glucose, Bld: 108 mg/dL — ABNORMAL HIGH (ref 65–99)
HCT: 36 % (ref 36.0–46.0)
Hemoglobin: 12.2 g/dL (ref 12.0–15.0)
Potassium: 4.1 mmol/L (ref 3.5–5.1)
Sodium: 138 mmol/L (ref 135–145)
TCO2: 30 mmol/L (ref 22–32)

## 2017-05-01 LAB — BASIC METABOLIC PANEL
Anion gap: 11 (ref 5–15)
BUN: 11 mg/dL (ref 6–23)
BUN: 10 mg/dL (ref 6–20)
CO2: 35 mEq/L — ABNORMAL HIGH (ref 19–32)
CO2: 31 mmol/L (ref 22–32)
Calcium: 9.8 mg/dL (ref 8.4–10.5)
Calcium: 9.7 mg/dL (ref 8.9–10.3)
Chloride: 96 mEq/L (ref 96–112)
Chloride: 96 mmol/L — ABNORMAL LOW (ref 101–111)
Creatinine, Ser: 0.88 mg/dL (ref 0.40–1.20)
Creatinine, Ser: 0.86 mg/dL (ref 0.44–1.00)
GFR calc Af Amer: >60 mL/min (ref >60)
GFR calc non Af Amer: >60 mL/min (ref >60)
GFR: 91.46 mL/min (ref >60.00)
Glucose, Bld: 78 mg/dL (ref 70–99)
Glucose, Bld: 92 mg/dL (ref 65–99)
Potassium: 2.9 mEq/L — ABNORMAL LOW (ref 3.5–5.1)
Potassium: 2.7 mmol/L — CL (ref 3.5–5.1)
Sodium: 139 mEq/L (ref 135–145)
Sodium: 138 mmol/L (ref 135–145)

## 2017-05-01 LAB — MAGNESIUM: Magnesium: 2 mg/dL (ref 1.5–2.5)

## 2017-05-01 LAB — CBC
HCT: 38.4 % (ref 36.0–46.0)
Hemoglobin: 13.1 g/dL (ref 12.0–15.0)
MCH: 30.3 pg (ref 26.0–34.0)
MCHC: 34.1 g/dL (ref 30.0–36.0)
MCV: 88.9 fL (ref 78.0–100.0)
Platelets: 379 10*3/uL (ref 150–400)
RBC: 4.32 MIL/uL (ref 3.87–5.11)
RDW: 13.3 % (ref 11.5–15.5)
WBC: 8 10*3/uL (ref 4.0–10.5)

## 2017-05-01 LAB — I-STAT TROPONIN, ED: Troponin i, poc: 0 ng/mL (ref 0.00–0.08)

## 2017-05-01 LAB — PHOSPHORUS: Phosphorus: 2.9 mg/dL (ref 2.3–4.6)

## 2017-05-01 MED ORDER — SPIRONOLACTONE 25 MG PO TABS: 50.0000 mg | ORAL_TABLET | Freq: Every day | ORAL | 5 refills | Status: DC

## 2017-05-01 MED ORDER — POTASSIUM CHLORIDE CRYS ER 20 MEQ PO TBCR
60.0000 meq | EXTENDED_RELEASE_TABLET | Freq: Once | ORAL | Status: AC
  Administered 2017-05-01: 60 meq via ORAL
  Filled 2017-05-01: qty 3

## 2017-05-01 MED ORDER — POTASSIUM CHLORIDE 10 MEQ/100ML IV SOLN
10.0000 meq | INTRAVENOUS | Status: AC
  Administered 2017-05-01 (×2): 10 meq via INTRAVENOUS
  Filled 2017-05-01 (×2): qty 100

## 2017-05-01 NOTE — ED Notes (Signed)
ED Provider at bedside. 

## 2017-05-01 NOTE — Telephone Encounter (Signed)
K+ dropped from 3.8 to 2.9 overnight. Reviewed results with Dr. Charlett Blake. She agreed pt needs inpatient evaluation. Spoke to patient and advised her to go to the [facility] for further evaluation.  Report given to Dr. Alvino Chapel at [facility] ED.

## 2017-05-01 NOTE — ED Triage Notes (Signed)
Patient told to come to ED for further work-up of hypokalemia - was observed overnight earlier this week for potassium replacement - K+ was 3.8 upon d/c. Patient followed up with PCP this morning for repeat blood work and potassium is back down to 2.8. She denies N/V/D. Reports this has been an on-going issue, has intermittent palpitations and dizziness but no symptoms at this time.

## 2017-05-01 NOTE — ED Notes (Signed)
Pt verbalizes understanding of d/c instructions. Pt ambulatory at d/c with all belongings.   

## 2017-05-01 NOTE — Patient Instructions (Signed)
Please complete lab work prior to leaving. Complete 24 hour urine and return Monday. Go to the ER if you develop worsening weakness or recurrent palpitations as this could indicate that your potassium has dropped.  Follow up in 1 week.

## 2017-05-01 NOTE — ED Provider Notes (Signed)
[facility] EMERGENCY DEPARTMENT Provider Note   CSN: [id number] Arrival date & time: 05/01/17  1741     History   Chief Complaint Chief Complaint  Patient presents with  . Hypokalemia    HPI Nicole Quinn is a 42 y.o. female.  Patient with hx hypokalemia, c/o being told to come to ED as k low again. k 2.7. Symptoms moderate, persistent, recurrent. States compliant w home meds including kcl 40 meq 4x/day.  Pt indicates they are unsure why k keeps getting low. Normal urine output. No diarrhea. Pt reports mild generalized weakness. States otherwise does not feel acutely ill or sick. Today, no faintness or dizziness.   The history is provided by the patient.    Past Medical History:  Diagnosis Date  . Allergy   . Anemia   . Endometriosis   . History of echocardiogram    Echo 6/18: EF 60-65, no RWMA, normal diastolic function  . History of nuclear stress test    Myoview 6/18: EF 60, normal perfusion, Low Risk  . History of UTI   . Hypertension    pregnancy induced HTN/pre-eclampsia //  on meds since 2nd child (2000)    Patient Active Problem List   Diagnosis Date Noted  . Angioedema 06/26/2016  . Seasonal and perennial allergic rhinitis 06/26/2016  . Allergic conjunctivitis 06/26/2016  . Food allergy 06/26/2016  . Allergic urticaria 06/26/2016  . Depression 01/29/2016  . Left wrist pain 11/29/2015  . Obesity (BMI 30.0-34.9) 08/12/2013  . Other and unspecified hyperlipidemia 02/24/2013  . Chronic constipation 02/22/2013  . Routine general medical examination at a health care facility 02/22/2013  . Anxiety associated with depression 10/10/2011  . Dyspnea 08/24/2011  . OTHER ACNE 06/04/2009  . HYPERGLYCEMIA, BORDERLINE 06/04/2009  . Essential hypertension 04/10/2009    Past Surgical History:  Procedure Laterality Date  . ABDOMINAL HYSTERECTOMY  2003   partial  . CARPAL TUNNEL RELEASE     right  . GANGLION CYST EXCISION     right  . Hammer  Toe Repair Bilateral 12/29/2013   Lt #3, #4, Rt #2.     OB History   None      Home Medications    Prior to Admission medications   Medication Sig Start Date End Date Taking? Authorizing Provider  ciclopirox (PENLAC) 8 % solution Apply topically at bedtime. Apply over nail and surrounding skin. Apply daily over previous coat. After seven (7) days, may remove with alcohol and continue cycle. 10/07/16   Sheard, Myeong O, DPM  cloNIDine (CATAPRES) 0.1 MG tablet TAKE 1 TABLET TWICE A DAY 12/26/16   Debbrah Alar, NP  DULoxetine (CYMBALTA) 30 MG capsule 1 tablet by mouth once daily for 1 week, then increase to 2 tabs once daily on week two 03/10/17   Debbrah Alar, NP  fluticasone (FLONASE) 50 MCG/ACT nasal spray Place 2 sprays into both nostrils daily as needed for allergies or rhinitis. 06/26/16   Bobbitt, Sedalia Muta, MD  levocetirizine (XYZAL) 5 MG tablet Take 1 tablet (5 mg total) by mouth every evening. 06/26/16   Bobbitt, Sedalia Muta, MD  metoprolol succinate (TOPROL XL) 100 MG 24 hr tablet Take 1.5 tablets by mouth once daily. 04/21/17   Debbrah Alar, NP  Multiple Vitamin (MULTIVITAMIN) tablet Take 1 tablet by mouth daily.      [provider]  olopatadine (PATANOL) 0.1 % ophthalmic solution Place 1 drop into both eyes 2 (two) times daily as needed for allergies. 06/26/16  Bobbitt, Sedalia Muta, MD  potassium chloride 20 MEQ/15ML (10%) SOLN Take 30 mLs (40 mEq total) by mouth 4 (four) times daily. 04/28/17   Debbrah Alar, NP  potassium chloride SA (K-DUR,KLOR-CON) 20 MEQ tablet Take 2 tablets (40 mEq total) by mouth 3 (three) times daily. 04/24/17   Debbrah Alar, NP  spironolactone (ALDACTONE) 25 MG tablet Take 1 tablet (25 mg total) by mouth daily. 04/25/17   Debbrah Alar, NP  venlafaxine XR (EFFEXOR XR) 37.5 MG 24 hr capsule 1 tab by mouth once daily for 1 week then stop 03/10/17   Debbrah Alar, NP    Family History Family History  Problem  Relation Age of Onset  . Allergies Daughter   . Allergic rhinitis Daughter   . Allergies Son   . Allergic rhinitis Son   . Leukemia Paternal Grandmother   . Arthritis Mother   . Hyperlipidemia Mother   . Hypertension Mother   . Stroke Mother   . Allergic rhinitis Mother   . Hypertension Father   . Diabetes Father   . Arthritis Maternal Grandmother   . Stroke Maternal Grandmother 50  . Hypertension Maternal Grandmother   . Heart attack Maternal Grandmother 50  . Coronary artery disease Maternal Aunt 50  . Coronary artery disease Maternal Aunt 50  . Angioedema Neg Hx   . Asthma Neg Hx   . Eczema Neg Hx   . Immunodeficiency Neg Hx   . Urticaria Neg Hx     Social History Social History   Tobacco Use  . Smoking status: Never Smoker  . Smokeless tobacco: Never Used  Substance Use Topics  . Alcohol use: No    Alcohol/week: 0.0 oz  . Drug use: No     Allergies   Amoxicillin and Ace inhibitors   Review of Systems Review of Systems  Constitutional: Negative for fever.  HENT: Negative for sore throat.   Eyes: Negative for redness.  Respiratory: Negative for shortness of breath.   Cardiovascular: Negative for chest pain.  Gastrointestinal: Negative for abdominal pain, diarrhea and vomiting.  Genitourinary: Negative for flank pain.  Musculoskeletal: Negative for back pain.  Skin: Negative for rash.  Neurological: Negative for headaches.  Hematological: Does not bruise/bleed easily.  Psychiatric/Behavioral: Negative for confusion.     Physical Exam Updated Vital Signs BP (!) 136/93   Pulse 87   Temp 98.1 F (36.7 C) (Oral)   Resp 11   Ht 1.626 m (5\' 4" )   Wt 98.9 kg (218 lb)   SpO2 100%   BMI 37.42 kg/m   Physical Exam  Constitutional: She appears well-developed and well-nourished. No distress.  HENT:  Mouth/Throat: Oropharynx is clear and moist.  Eyes: Conjunctivae are normal. No scleral icterus.  Neck: Neck supple. No tracheal deviation present.    Cardiovascular: Normal rate, regular rhythm, normal heart sounds and intact distal pulses.  Pulmonary/Chest: Effort normal and breath sounds normal. No respiratory distress.  Abdominal: Soft. Normal appearance and bowel sounds are normal. She exhibits no distension. There is no tenderness.  Musculoskeletal: She exhibits no edema.  Neurological: She is alert.  Skin: Skin is warm and dry. No rash noted. She is not diaphoretic.  Psychiatric: She has a normal mood and affect.  Nursing note and vitals reviewed.    ED Treatments / Results  Labs (all labs ordered are listed, but only abnormal results are displayed) Results for orders placed or performed during the hospital encounter of 86/76/19  Basic metabolic panel  Result Value Ref  Range   Sodium 138 135 - 145 mmol/L   Potassium 2.7 (LL) 3.5 - 5.1 mmol/L   Chloride 96 (L) 101 - 111 mmol/L   CO2 31 22 - 32 mmol/L   Glucose, Bld 92 65 - 99 mg/dL   BUN 10 6 - 20 mg/dL   Creatinine, Ser 0.86 0.44 - 1.00 mg/dL   Calcium 9.7 8.9 - 10.3 mg/dL   GFR calc non Af Amer >60 >60 mL/min   GFR calc Af Amer >60 >60 mL/min   Anion gap 11 5 - 15  CBC  Result Value Ref Range   WBC 8.0 4.0 - 10.5 K/uL   RBC 4.32 3.87 - 5.11 MIL/uL   Hemoglobin 13.1 12.0 - 15.0 g/dL   HCT 38.4 36.0 - 46.0 %   MCV 88.9 78.0 - 100.0 fL   MCH 30.3 26.0 - 34.0 pg   MCHC 34.1 30.0 - 36.0 g/dL   RDW 13.3 11.5 - 15.5 %   Platelets 379 150 - 400 K/uL  I-stat troponin, ED  Result Value Ref Range   Troponin i, poc 0.00 0.00 - 0.08 ng/mL   Comment 3            EKG None  Radiology No results found.  Procedures Procedures (including critical care time)  Medications Ordered in ED Medications  potassium chloride 10 mEq in 100 mL IVPB (has no administration in time range)  potassium chloride SA (K-DUR,KLOR-CON) CR tablet 60 mEq (60 mEq Oral Given 05/01/17 1957)     Initial Impression / Assessment and Plan / ED Course  I have reviewed the triage vital signs and  the nursing notes.  Pertinent labs & imaging results that were available during my care of the patient were reviewed by me and considered in my medical decision making (see chart for details).  Labs sent.   Labs reviewed, k low 2.7.  Mg normal.   kcl 10 meq iv over 1 hr x 2. kcl po.  Reviewed nursing notes and prior charts for additional history.   Ecg.  Given recurrent hypokalemia in past 4 weeks, renal consulted. Discussed pt, recent workup, recurrent low k, meds, etc - he indicates add tsh to labs, increase aldactone to 50 mg a day, and he will arrange f/u in office.  Recheck k 4.1.  No current symptoms. Pt appears stable for d/c.     Final Clinical Impressions(s) / ED Diagnoses   Final diagnoses:  None    ED Discharge Orders    None       Lajean Saver, MD 05/02/17 2024

## 2017-05-01 NOTE — Progress Notes (Signed)
Subjective:    Patient ID: Nicole Quinn, female    DOB: Jun 12, 1975, 42 y.o.   MRN: [id number]  HPI  Nicole Quinn is a 42 yr old female who presents today for ED follow up of hypokalemia.  She is accompanied today by her husband.  Symptoms initially presented on 04/10/2017 with a syncopal episode.  She was a evaluated in the emergency department.  She was found to be hypokalemic and her potassium was repleted.  I saw her back on 3/29.  At that time I advised her to remain off of Lasix but to continue her hydrochlorothiazide.  She was continued on potassium.  That day her potassium was found to be 3.0.  She was given 40 mEq of Kdur which was separated by 4 hours.  She was then continued on K Dur 20 mEq twice daily.  Follow-up potassium was even lower at 2.6.  She was given additional oral potassium repletion and her hydrochlorothiazide was discontinued.  In place of hydrochlorothiazide she was started on Aldactone.  However despite efforts at oral repletion and high doses of potassium (40 mEq 4 times daily) her potassium failed to come up higher than 2.7.  As a result we advised the patient to be evaluated in the emergency department on 04/29/2017.  During her visit to the emergency department she was given oral and IV potassium and her level was brought up to 3.8.  She reports that she continues to have some dizziness, lightheadedness and fatigue.  However her palpitations have improved since her visit to the emergency department.  She continues K Dur 40 mEq 4 times daily as well as Aldactone.  She reports that her lower extremity edema has been stable.  Review of Systems See HPI    Past Medical History:  Diagnosis Date  . Allergy   . Anemia   . Endometriosis   . History of echocardiogram    Echo 6/18: EF 60-65, no RWMA, normal diastolic function  . History of nuclear stress test    Myoview 6/18: EF 60, normal perfusion, Low Risk  . History of UTI   . Hypertension    pregnancy induced  HTN/pre-eclampsia //  on meds since 2nd child (2000)     Social History   Socioeconomic History  . Marital status: Married    Spouse name: Not on file  . Number of children: 2  . Years of education: Not on file  . Highest education level: Not on file  Occupational History    Employer: [employer]  . Financial resource strain: Not on file  . Food insecurity:    Worry: Not on file    Inability: Not on file  . Transportation needs:    Medical: Not on file    Non-medical: Not on file  Tobacco Use  . Smoking status: Never Smoker  . Smokeless tobacco: Never Used  Substance and Sexual Activity  . Alcohol use: No    Alcohol/week: 0.0 oz  . Drug use: No  . Sexual activity: Not on file  Lifestyle  . Physical activity:    Days per week: Not on file    Minutes per session: Not on file  . Stress: Not on file  Relationships  . Social connections:    Talks on phone: Not on file    Gets together: Not on file    Attends religious service: Not on file    Active member  of club or organization: Not on file    Attends meetings of clubs or organizations: Not on file    Relationship status: Not on file  . Intimate partner violence:    Fear of current or ex partner: Not on file    Emotionally abused: Not on file    Physically abused: Not on file    Forced sexual activity: Not on file  Other Topics Concern  . Not on file  Social History Narrative   Regular exercise: yes.   Married.  Lives with one child.  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         Past Surgical History:  Procedure Laterality Date  . ABDOMINAL HYSTERECTOMY  2003   partial  . CARPAL TUNNEL RELEASE     right  . GANGLION CYST EXCISION     right  . Hammer Toe Repair Bilateral 12/29/2013   Lt #3, #4, Rt #2.    Family History  Problem Relation Age of Onset  . Allergies Daughter   . Allergic rhinitis Daughter   . Allergies  Son   . Allergic rhinitis Son   . Leukemia Paternal Grandmother   . Arthritis Mother   . Hyperlipidemia Mother   . Hypertension Mother   . Stroke Mother   . Allergic rhinitis Mother   . Hypertension Father   . Diabetes Father   . Arthritis Maternal Grandmother   . Stroke Maternal Grandmother 50  . Hypertension Maternal Grandmother   . Heart attack Maternal Grandmother 50  . Coronary artery disease Maternal Aunt 50  . Coronary artery disease Maternal Aunt 50  . Angioedema Neg Hx   . Asthma Neg Hx   . Eczema Neg Hx   . Immunodeficiency Neg Hx   . Urticaria Neg Hx     Allergies  Allergen Reactions  . Amoxicillin     rash  . Ace Inhibitors     REACTION: lip swelling    Current Outpatient Medications on File Prior to Visit  Medication Sig Dispense Refill  . ciclopirox (PENLAC) 8 % solution Apply topically at bedtime. Apply over nail and surrounding skin. Apply daily over previous coat. After seven (7) days, may remove with alcohol and continue cycle. 6.6 mL 3  . cloNIDine (CATAPRES) 0.1 MG tablet TAKE 1 TABLET TWICE A DAY 60 tablet 5  . DULoxetine (CYMBALTA) 30 MG capsule 1 tablet by mouth once daily for 1 week, then increase to 2 tabs once daily on week two 60 capsule 0  . fluticasone (FLONASE) 50 MCG/ACT nasal spray Place 2 sprays into both nostrils daily as needed for allergies or rhinitis. 18.2 g 5  . levocetirizine (XYZAL) 5 MG tablet Take 1 tablet (5 mg total) by mouth every evening. 30 tablet 5  . metoprolol succinate (TOPROL XL) 100 MG 24 hr tablet Take 1.5 tablets by mouth once daily. 135 tablet 1  . Multiple Vitamin (MULTIVITAMIN) tablet Take 1 tablet by mouth daily.      Marland Kitchen olopatadine (PATANOL) 0.1 % ophthalmic solution Place 1 drop into both eyes 2 (two) times daily as needed for allergies. 5 mL 5  . potassium chloride 20 MEQ/15ML (10%) SOLN Take 30 mLs (40 mEq total) by mouth 4 (four) times daily. 3800 mL 2  . potassium chloride SA (K-DUR,KLOR-CON) 20 MEQ tablet Take  2 tablets (40 mEq total) by mouth 3 (three) times daily. 180 tablet 3  . spironolactone (ALDACTONE) 25 MG tablet Take 1 tablet (25 mg total) by  mouth daily. 30 tablet 5  . venlafaxine XR (EFFEXOR XR) 37.5 MG 24 hr capsule 1 tab by mouth once daily for 1 week then stop 7 capsule 0   No current facility-administered medications on file prior to visit.     BP 130/90 (BP Location: Right Arm, Cuff Size: Large)   Pulse 86   Temp 98.1 F (36.7 C) (Oral)   Resp 18   Ht 5\' 4"  (1.626 m)   Wt 218 lb 6.4 oz (99.1 kg)   SpO2 100%   BMI 37.49 kg/m    Objective:   Physical Exam  Constitutional: She is oriented to person, place, and time. She appears well-developed and well-nourished.  HENT:  Head: Normocephalic and atraumatic.  Cardiovascular: Normal rate, regular rhythm and normal heart sounds.  No murmur heard. Pulmonary/Chest: Effort normal and breath sounds normal. No respiratory distress. She has no wheezes.  Musculoskeletal: She exhibits no edema.  Neurological: She is alert and oriented to person, place, and time.  Psychiatric: She has a normal mood and affect. Her behavior is normal. Judgment and thought content normal.          Assessment & Plan:  Severe hypokalemia-will order a 24-hour urine potassium as well as aldosterone and renin testing.  With her history of hypertension, I suspect hyperaldosteronism.  A referral has previously been made to nephrology and we are awaiting appointment.  Will obtain a follow-up potassium level today.  If her potassium has again dropped severely she may need admission for inpatient workup.  I am hopeful however that with her recent repletion that her potassium will be stable.  I have advised the patient to follow-up in 1 week.

## 2017-05-01 NOTE — Discharge Instructions (Addendum)
It was our pleasure to provide your ER care today - we hope that you feel better.  We discussed your case with our kidney doctors (Dr Posey Pronto)  - we sent additional lab test [facility]) which will be resulted tomorrow. Call their office Monday AM to arrange/confirm an appointment for this week.  They also indicated for you to increase your aldactone dose from 25 mg a day to 50 mg a day.  Continue your potassium supplement. Eat plenty of fruits and vegetables. Follow up with your doctor for recheck of potassium level Monday.   Return to ER if worse, new symptoms, weak/fainting, other concern.

## 2017-05-01 NOTE — Telephone Encounter (Signed)
I did speak to [facility] re: possible direct admit but she states that there are no beds so she recommends pt to go through the ED.

## 2017-05-02 LAB — TSH: TSH: 0.5 u[IU]/mL (ref 0.350–4.500)

## 2017-05-04 ENCOUNTER — Other Ambulatory Visit: Payer: BLUE CROSS/BLUE SHIELD

## 2017-05-04 DIAGNOSIS — E876 Hypokalemia: Secondary | ICD-10-CM | POA: Diagnosis not present

## 2017-05-05 ENCOUNTER — Encounter: Payer: Self-pay | Admitting: Family

## 2017-05-05 DIAGNOSIS — E876 Hypokalemia: Secondary | ICD-10-CM | POA: Diagnosis not present

## 2017-05-05 DIAGNOSIS — I10 Essential (primary) hypertension: Secondary | ICD-10-CM | POA: Diagnosis not present

## 2017-05-05 LAB — POTASSIUM, URINE, 24 HOUR: Potassium, 24H Ur: 47 mmol/24 h (ref 22–160)

## 2017-05-06 ENCOUNTER — Ambulatory Visit: Payer: BLUE CROSS/BLUE SHIELD | Admitting: Internal Medicine

## 2017-05-06 LAB — ALDOSTERONE + RENIN ACTIVITY W/ RATIO
ALDO / PRA Ratio: 2 Ratio (ref 0.9–28.9)
Aldosterone: 12 ng/dL
Renin Activity: 6.15 ng/mL/h — ABNORMAL HIGH (ref 0.25–5.82)

## 2017-05-06 NOTE — Telephone Encounter (Signed)
Spoke with pt to verify today's appt with Dr Larose Kells to follow up on her potassium. Per pt, she was contacted by other CMA in the office yesterday about appt. Pt states she was seen by nephrologist yesterday and potassium was 3.4. Result verified with CMA in office. CMA reports that she contacted PCP with result and was told pt could follow up with PCP next week. Appt was already scheduled for 05/12/17. Pt states she received her 24hr urine result from her specialist yesterday. Today's appt has been cancelled. Nothing further needed at this time.

## 2017-05-07 ENCOUNTER — Ambulatory Visit: Payer: BLUE CROSS/BLUE SHIELD | Admitting: Medical

## 2017-05-07 ENCOUNTER — Telehealth: Payer: Self-pay | Admitting: *Deleted

## 2017-05-07 NOTE — Telephone Encounter (Signed)
-----   Message from Debbrah Alar, NP sent at 05/06/2017  3:26 PM EDT ----- Please contact patient and let her know that her renin level (which is one of the hormones that helps to regulate potassium and blood pressure) is elevated. Her urine potassium 24 hour is also elevated. She is excreting more potassium than she should be. In addition to her seeing the kidney doctor I would also like to refer her to [facility] endocrinology. Please place a referral to Dr. Dwyane Dee. Reason for referral is hypokalemia, elevated Renin, elevated urine potassium, rule out secondary hyperaldosteronism.  If she has not already completed a renal artery duplex (please check chart) let's place order to rule out renal artery stenosis. Dx hypertension.

## 2017-05-11 NOTE — Telephone Encounter (Signed)
I contacted the patient.  I reviewed our labs as well as the nephrology note with the patient.  She reports that she continues to feel some fatigue, but otherwise is feeling okay.  She is scheduled to see me tomorrow for follow-up.  I advised her that at this point I will defer further workup and referrals to her nephrologist.  Plan to repeat basic metabolic panel tomorrow.  Patient verbalizes understanding and all questions were answered.

## 2017-05-11 NOTE — Telephone Encounter (Signed)
Nicole Quinn-- Nephrology notes you requested are in your yellow folder for review.

## 2017-05-12 ENCOUNTER — Ambulatory Visit (INDEPENDENT_AMBULATORY_CARE_PROVIDER_SITE_OTHER): Payer: BLUE CROSS/BLUE SHIELD | Admitting: Family

## 2017-05-12 ENCOUNTER — Encounter: Payer: Self-pay | Admitting: Family

## 2017-05-12 VITALS — BP 132/92 | HR 84 | Temp 98.9°F | Resp 16 | Ht 64.0 in | Wt 226.2 lb

## 2017-05-12 DIAGNOSIS — E876 Hypokalemia: Secondary | ICD-10-CM | POA: Diagnosis not present

## 2017-05-12 DIAGNOSIS — I10 Essential (primary) hypertension: Secondary | ICD-10-CM

## 2017-05-12 LAB — BASIC METABOLIC PANEL
BUN: 9 mg/dL (ref 6–23)
CO2: 29 mEq/L (ref 19–32)
Calcium: 9.3 mg/dL (ref 8.4–10.5)
Chloride: 99 mEq/L (ref 96–112)
Creatinine, Ser: 0.69 mg/dL (ref 0.40–1.20)
GFR: 121.07 mL/min (ref >60.00)
Glucose, Bld: 70 mg/dL (ref 70–99)
Potassium: 3.4 mEq/L — ABNORMAL LOW (ref 3.5–5.1)
Sodium: 133 mEq/L — ABNORMAL LOW (ref 135–145)

## 2017-05-12 MED ORDER — SPIRONOLACTONE 50 MG PO TABS: 50.0000 mg | ORAL_TABLET | Freq: Every day | ORAL | 1 refills | Status: DC

## 2017-05-12 NOTE — Progress Notes (Signed)
Subjective:    Patient ID: Nicole Quinn, female    DOB: May 16, 1975, 42 y.o.   MRN: [id number]  HPI   Pt is a 42 yr old female who presents today for ER follow up. She was most recently seen in the ER on 05/01/17 with hypokalemia. ER record and nephrology consult are reviewed.  Potassium was repleted and her aldactone was increased. She continues aldactone and K supplement 62meq QID. Reports notes improvement in palpitations. Reports swelling has been ok.  Fatigue is improved.   Review of Systems Past Medical History:  Diagnosis Date  . Allergy   . Anemia   . Endometriosis   . History of echocardiogram    Echo 6/18: EF 60-65, no RWMA, normal diastolic function  . History of nuclear stress test    Myoview 6/18: EF 60, normal perfusion, Low Risk  . History of UTI   . Hypertension    pregnancy induced HTN/pre-eclampsia //  on meds since 2nd child (2000)     Social History   Socioeconomic History  . Marital status: Married    Spouse name: Not on file  . Number of children: 2  . Years of education: Not on file  . Highest education level: Not on file  Occupational History    Employer: [employer]  . Financial resource strain: Not on file  . Food insecurity:    Worry: Not on file    Inability: Not on file  . Transportation needs:    Medical: Not on file    Non-medical: Not on file  Tobacco Use  . Smoking status: Never Smoker  . Smokeless tobacco: Never Used  Substance and Sexual Activity  . Alcohol use: No    Alcohol/week: 0.0 oz  . Drug use: No  . Sexual activity: Not on file  Lifestyle  . Physical activity:    Days per week: Not on file    Minutes per session: Not on file  . Stress: Not on file  Relationships  . Social connections:    Talks on phone: Not on file    Gets together: Not on file    Attends religious service: Not on file    Active member of club or organization: Not  on file    Attends meetings of clubs or organizations: Not on file    Relationship status: Not on file  . Intimate partner violence:    Fear of current or ex partner: Not on file    Emotionally abused: Not on file    Physically abused: Not on file    Forced sexual activity: Not on file  Other Topics Concern  . Not on file  Social History Narrative   Regular exercise: yes.   Married.  Lives with one child.  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         Past Surgical History:  Procedure Laterality Date  . ABDOMINAL HYSTERECTOMY  2003   partial  . CARPAL TUNNEL RELEASE     right  . GANGLION CYST EXCISION     right  . Hammer Toe Repair Bilateral 12/29/2013   Lt #3, #4, Rt #2.    Family History  Problem Relation Age of Onset  . Allergies Daughter   . Allergic rhinitis Daughter   . Allergies Son   . Allergic rhinitis Son   . Leukemia Paternal Grandmother   . Arthritis Mother   . Hyperlipidemia  Mother   . Hypertension Mother   . Stroke Mother   . Allergic rhinitis Mother   . Hypertension Father   . Diabetes Father   . Arthritis Maternal Grandmother   . Stroke Maternal Grandmother 50  . Hypertension Maternal Grandmother   . Heart attack Maternal Grandmother 50  . Coronary artery disease Maternal Aunt 50  . Coronary artery disease Maternal Aunt 50  . Angioedema Neg Hx   . Asthma Neg Hx   . Eczema Neg Hx   . Immunodeficiency Neg Hx   . Urticaria Neg Hx     Allergies  Allergen Reactions  . Amoxicillin     rash  . Ace Inhibitors     REACTION: lip swelling    Current Outpatient Medications on File Prior to Visit  Medication Sig Dispense Refill  . cloNIDine (CATAPRES) 0.1 MG tablet TAKE 1 TABLET TWICE A DAY 60 tablet 5  . DULoxetine (CYMBALTA) 30 MG capsule 1 tablet by mouth once daily for 1 week, then increase to 2 tabs once daily on week two (Patient taking differently: Take 30 mg by mouth daily. ) 60 capsule 0  . fluticasone (FLONASE) 50 MCG/ACT nasal spray  Place 2 sprays into both nostrils daily as needed for allergies or rhinitis. 18.2 g 5  . levocetirizine (XYZAL) 5 MG tablet Take 1 tablet (5 mg total) by mouth every evening. 30 tablet 5  . metoprolol succinate (TOPROL XL) 100 MG 24 hr tablet Take 1.5 tablets by mouth once daily. 135 tablet 1  . Multiple Vitamin (MULTIVITAMIN) tablet Take 1 tablet by mouth daily.      Marland Kitchen olopatadine (PATANOL) 0.1 % ophthalmic solution Place 1 drop into both eyes 2 (two) times daily as needed for allergies. 5 mL 5  . potassium chloride SA (K-DUR,KLOR-CON) 20 MEQ tablet Take 2 tablets (40 mEq total) by mouth 3 (three) times daily. (Patient taking differently: Take 40 mEq by mouth 4 (four) times daily. ) 180 tablet 3  . spironolactone (ALDACTONE) 25 MG tablet Take 2 tablets (50 mg total) by mouth daily. 30 tablet 5  . venlafaxine XR (EFFEXOR XR) 37.5 MG 24 hr capsule 1 tab by mouth once daily for 1 week then stop (Patient taking differently: Take 37.5 mg by mouth daily with breakfast. ) 7 capsule 0   No current facility-administered medications on file prior to visit.     BP (!) 132/92 (BP Location: Right Arm, Patient Position: Sitting, Cuff Size: Large)   Pulse 84   Temp 98.9 F (37.2 C) (Oral)   Resp 16   Ht 5\' 4"  (1.626 m)   Wt 226 lb 3.2 oz (102.6 kg)   SpO2 99%   BMI 38.83 kg/m       Objective:   Physical Exam  Constitutional: She is oriented to person, place, and time. She appears well-developed and well-nourished.  Cardiovascular: Normal rate, regular rhythm and normal heart sounds.  No murmur heard. Pulmonary/Chest: Effort normal and breath sounds normal. No respiratory distress. She has no wheezes.  Musculoskeletal:  Trace bilateral LE edema.   Neurological: She is alert and oriented to person, place, and time.  Skin: Skin is warm and dry.  Psychiatric: She has a normal mood and affect. Her behavior is normal. Judgment and thought content normal.          Assessment & Plan:    Hypokalemia- she is now following with nephrology.  Renin level was mildly elevated when we checked with normal aldo/PRA ratio. She  is advised to continue aldactone and K supplementation. Follow up K+ is obtained today and is 3.4.  I have advised her to keep her upcoming follow up with nephrology.   HTN- BP fair today. Continue toprol xl, clonidine, aldactone.  BP Readings from Last 3 Encounters:  05/12/17 (!) 132/92  05/01/17 122/84  05/01/17 130/90

## 2017-05-12 NOTE — Patient Instructions (Signed)
Please continue potassium. Continue aldactone 50mg  once daily (rx has been sent for 50mg  tabs).

## 2017-06-01 DIAGNOSIS — E876 Hypokalemia: Secondary | ICD-10-CM | POA: Diagnosis not present

## 2017-06-03 DIAGNOSIS — E876 Hypokalemia: Secondary | ICD-10-CM | POA: Diagnosis not present

## 2017-06-03 DIAGNOSIS — I10 Essential (primary) hypertension: Secondary | ICD-10-CM | POA: Diagnosis not present

## 2017-07-23 ENCOUNTER — Encounter: Payer: Self-pay | Admitting: Podiatry

## 2017-07-30 ENCOUNTER — Ambulatory Visit (INDEPENDENT_AMBULATORY_CARE_PROVIDER_SITE_OTHER): Payer: BLUE CROSS/BLUE SHIELD | Admitting: Podiatry

## 2017-07-30 DIAGNOSIS — M216X1 Other acquired deformities of right foot: Secondary | ICD-10-CM | POA: Diagnosis not present

## 2017-07-30 DIAGNOSIS — M659 Synovitis and tenosynovitis, unspecified: Secondary | ICD-10-CM

## 2017-07-30 DIAGNOSIS — M216X2 Other acquired deformities of left foot: Secondary | ICD-10-CM

## 2017-07-30 DIAGNOSIS — M24571 Contracture, right ankle: Secondary | ICD-10-CM

## 2017-07-30 NOTE — Patient Instructions (Signed)
Seen for pain and swelling in right ankle. Noted of tight achilles tendon that has not improved since the last visit in 10/07/16. Equinus brace dispensed with instruction. May benefit from custom orthotics. Will call for benefit information.

## 2017-07-30 NOTE — Progress Notes (Signed)
Subjective: 42 y.o. year old female patient presents complaining of pain in right foot.   Was in 10/07/16 for the same issue.  Been able to do stretch exercise 3 days a week. Pain is off and on since the last visit.  HPI: Was instructed to do daily stretch exercise for the tight Achilles tendon on right. Positive for hammer toe surgery 2-4 bilateral in 2015.   OBJECTIVE: DERMATOLOGIC EXAMINATION: No abnormal findings. VASCULAR EXAMINATION OF LOWER LIMBS: All pedal pulses are palpable in normal pulsation. NEUROLOGIC: All epicritic and tactile sensations grossly intact.  MUSCULOSKELETAL EXAMINATION: Tight Achilles tendon on right, normal on left. Mild Hallux valgus with bunion bilateral. Post surgical hammer toe repair 2-4 bilateral in 2015.  Assessment: Ankle equinus right. Compensatory Hyperpronation with midfoot abduction. Tenosynovitis right lateral mid foot.  Treatment: Reviewed findings and available treatment options. Reviewed the benefit of custom orthotics. Since the self stretching practice has not been sufficient, it may benefit using an Equinus brace. Equinus brace dispensed with instruction. [id number], [id number])  Equinus Brace Fitting and Dispensing Document: The plastic custom fitted Ankle Foot Orthosis was assembled in accordance with the manufacturer's instructions and based on the specific anatomical and physiological requirements of the patient. This device was custom fitted by me with expertise to provide this service and dispensed for the right foot. The patient was examined while wearing the device after several fitting maneuvers of trimming, bending, shaping, etc. and the fit was found to be appropriate.    Medical necessity: Due to the pain in the foot/ankle/leg with weight bearing throughout the day, with diagnosis of equinus deformity and related symptoms, this is medically necessary for the treatment.  The function of this device is to serve as an anti  contracture device of the Gastrocsoleal complex and to restrict and limit motion and help reduce excessive stress and strain to [employer] complex and foot/ankle/leg. It is being utilized to prevent the plantar contracture of the Gastrocsoleal complex.   The goal of the therapy are to:  1. Treat plantarflexion contracture of the ankle with dorsiflexion, the ankle on passive range of motion testing is noted to have at least 10 degrees (I.e, a non-fixed contracture). 2. Provide a reasonable expectation of the ability to correct the contracture.  3. Reduce the significantly with the beneficiary's functional abilities.  4. Used as a component of a therapy program which includes active stretching of the involved muscles and/or tendons. 5. Treat the beneficiary's plantar fasciitis. Additionally, medial and lateral ankle dorsiflex assistive and plantarflex restraint hinges are included on the brace and were set at 90 degrees of dorsiflexion. These will be periodically adjusted based on future physical examination of the patient's progress.  Instructions and Goals for the Patient: The device will be utilized for the next 8-12 weeks. The intent of these hinges is to resist plantarflexion and assist with dorsiflexion of the Gastrocsoleal complex and/or plantar fascia. These hinges will be adjusted over the course of the patient's therapy. The patient was instructed not to adjust the hinges. Patient was advised to bring the device to the office on next visit for further evaluation and adjustments.  The goals and function of this device was explained in detail to the patient. The patient stated that the device was comfortable when applied. The patient was shown and told in detail how to properly apply, remove, wear, and care for the device. The patient was able to apply and remove the device properly without assistance. Patient as advised not to ambulate  with the device in place.  The device was then dispensed  and was suitable for the condition and not substandard. No guarantees regarding resolution of symptoms were given and precautions were reviewed.  Written instructions and warranty information were given along with the list of the current [place] Standards.  The patient signed written proof of delivery. All questions were answered to the patient's satisfaction. The patent also given a follow up appointment in one month.

## 2017-08-03 ENCOUNTER — Encounter: Payer: Self-pay | Admitting: Podiatry

## 2017-08-03 ENCOUNTER — Encounter: Payer: Self-pay | Admitting: Family

## 2017-08-12 ENCOUNTER — Telehealth: Payer: Self-pay | Admitting: *Deleted

## 2017-08-12 NOTE — Telephone Encounter (Signed)
Called insurance and patient has met her individule deductible of $1500 and individual out of pocket expense of $5000 so services should be covered at 100%. Reference # for the call is [id number]. Patient notified and has scheduled an appointment.

## 2017-08-18 ENCOUNTER — Encounter: Payer: Self-pay | Admitting: Podiatry

## 2017-08-18 ENCOUNTER — Inpatient Hospital Stay: Admission: RE | Admit: 2017-08-18 | Payer: Self-pay | Source: Ambulatory Visit

## 2017-08-18 ENCOUNTER — Ambulatory Visit (INDEPENDENT_AMBULATORY_CARE_PROVIDER_SITE_OTHER): Payer: BLUE CROSS/BLUE SHIELD | Admitting: Podiatry

## 2017-08-18 ENCOUNTER — Other Ambulatory Visit: Payer: Self-pay

## 2017-08-18 DIAGNOSIS — M21962 Unspecified acquired deformity of left lower leg: Secondary | ICD-10-CM | POA: Diagnosis not present

## 2017-08-18 DIAGNOSIS — M659 Synovitis and tenosynovitis, unspecified: Secondary | ICD-10-CM | POA: Diagnosis not present

## 2017-08-18 DIAGNOSIS — M216X2 Other acquired deformities of left foot: Secondary | ICD-10-CM

## 2017-08-18 DIAGNOSIS — M21961 Unspecified acquired deformity of right lower leg: Secondary | ICD-10-CM | POA: Diagnosis not present

## 2017-08-18 DIAGNOSIS — M24571 Contracture, right ankle: Secondary | ICD-10-CM

## 2017-08-18 DIAGNOSIS — M216X1 Other acquired deformities of right foot: Secondary | ICD-10-CM

## 2017-08-18 NOTE — Progress Notes (Signed)
Subjective: Patient presents to have orthotics prepared. Been wearing Equinus brace for the past 2 weeks and still having the same pain without seeing any improvement. Pain remains at the posterolateral aspect of right ankle area, intensifies with increased activity.  HPI: Was instructed to do daily stretch exercise for the tight Achilles tendon on right. Positive for hammer toe surgery 2-4 bilateral in 2015.  OBJECTIVE: DERMATOLOGIC EXAMINATION: No abnormal findings. VASCULAR EXAMINATION OF LOWER LIMBS: All pedal pulses are palpable in normal pulsation. NEUROLOGIC: All epicritic and tactile sensations grossly intact.  MUSCULOSKELETAL EXAMINATION: Tight Achilles tendon on right, normal on left. Mild Hallux valgus with bunion bilateral. Post surgical hammer toe repair 2-4 bilateral in 2015.  X-ray of both feet done. Findings reveal enlarged medial eminence  First met right, Fibular sesamoid position at 4, Varus deformity 4 and 5 right. Short first met with enlarged medial eminence, Fibular sesamoid position at 4, varus deformity 5th left.  Lateral: High arched cavus foot with elevated first metatarsal right,  High arched cavus foot with elevated first met with dorsal spur at head left.  Assessment: Ankle equinus right. Compensatory Hyperpronation with midfoot abduction. Tenosynovitis right lateral mid foot. Metatarsal deformity bilateral.  Plan: Reviewed findings. Both feet casted for orthotics. Metatarsal binder dispensed x 1 pair.

## 2017-08-18 NOTE — Patient Instructions (Signed)
Both feet casted for orthotics. Reviewed stretch exercise. Continue with Equinus brace. Metatarsal binder dispensed x 1 pair. Will call when orthotics are ready.

## 2017-08-28 ENCOUNTER — Encounter: Payer: Self-pay | Admitting: Family

## 2017-08-28 ENCOUNTER — Ambulatory Visit (INDEPENDENT_AMBULATORY_CARE_PROVIDER_SITE_OTHER): Payer: BLUE CROSS/BLUE SHIELD | Admitting: Family

## 2017-08-28 VITALS — BP 140/93 | HR 79 | Temp 98.4°F | Resp 16 | Ht 64.0 in | Wt 202.0 lb

## 2017-08-28 DIAGNOSIS — Z0000 Encounter for general adult medical examination without abnormal findings: Secondary | ICD-10-CM

## 2017-08-28 LAB — HEPATIC FUNCTION PANEL
ALT: 11 U/L (ref 0–35)
AST: 15 U/L (ref 0–37)
Albumin: 4.3 g/dL (ref 3.5–5.2)
Alkaline Phosphatase: 81 U/L (ref 39–117)
Bilirubin, Direct: 0.1 mg/dL (ref 0.0–0.3)
Total Bilirubin: 0.5 mg/dL (ref 0.2–1.2)
Total Protein: 7.3 g/dL (ref 6.0–8.3)

## 2017-08-28 LAB — URINALYSIS, ROUTINE W REFLEX MICROSCOPIC
Bilirubin Urine: NEGATIVE
Hgb urine dipstick: NEGATIVE
Ketones, ur: 15 — AB
Leukocytes, UA: NEGATIVE
Nitrite: NEGATIVE
Specific Gravity, Urine: 1.01 (ref 1.000–1.030)
Total Protein, Urine: NEGATIVE
Urine Glucose: NEGATIVE
Urobilinogen, UA: 0.2 (ref 0.0–1.0)
pH: 6 (ref 5.0–8.0)

## 2017-08-28 LAB — CBC WITH DIFFERENTIAL/PLATELET
Basophils Absolute: 0.1 10*3/uL (ref 0.0–0.1)
Basophils Relative: 0.8 % (ref 0.0–3.0)
Eosinophils Absolute: 0 10*3/uL (ref 0.0–0.7)
Eosinophils Relative: 0.4 % (ref 0.0–5.0)
HCT: 35.5 % — ABNORMAL LOW (ref 36.0–46.0)
Hemoglobin: 12.1 g/dL (ref 12.0–15.0)
Lymphocytes Relative: 38.7 % (ref 12.0–46.0)
Lymphs Abs: 2.8 10*3/uL (ref 0.7–4.0)
MCHC: 34.1 g/dL (ref 30.0–36.0)
MCV: 91.5 fl (ref 78.0–100.0)
Monocytes Absolute: 0.5 10*3/uL (ref 0.1–1.0)
Monocytes Relative: 7 % (ref 3.0–12.0)
Neutro Abs: 3.9 10*3/uL (ref 1.4–7.7)
Neutrophils Relative %: 53.1 % (ref 43.0–77.0)
Platelets: 337 10*3/uL (ref 150.0–400.0)
RBC: 3.88 Mil/uL (ref 3.87–5.11)
RDW: 13.9 % (ref 11.5–15.5)
WBC: 7.3 10*3/uL (ref 4.0–10.5)

## 2017-08-28 LAB — LIPID PANEL
Cholesterol: 203 mg/dL — ABNORMAL HIGH (ref 0–200)
HDL: 43.2 mg/dL (ref >39.00)
LDL Cholesterol: 149 mg/dL — ABNORMAL HIGH (ref 0–99)
NonHDL: 159.43
Total CHOL/HDL Ratio: 5
Triglycerides: 52 mg/dL (ref 0.0–149.0)
VLDL: 10.4 mg/dL (ref 0.0–40.0)

## 2017-08-28 LAB — BASIC METABOLIC PANEL
BUN: 6 mg/dL (ref 6–23)
CO2: 30 mEq/L (ref 19–32)
Calcium: 10 mg/dL (ref 8.4–10.5)
Chloride: 99 mEq/L (ref 96–112)
Creatinine, Ser: 0.72 mg/dL (ref 0.40–1.20)
GFR: 115.1 mL/min (ref >60.00)
Glucose, Bld: 82 mg/dL (ref 70–99)
Potassium: 3.5 mEq/L (ref 3.5–5.1)
Sodium: 138 mEq/L (ref 135–145)

## 2017-08-28 LAB — TSH: TSH: 0.73 u[IU]/mL (ref 0.35–4.50)

## 2017-08-28 NOTE — Patient Instructions (Addendum)
Please complete lab work prior to leaving. Schedule mammogram on the first floor.  Great job losing weight.  Keep up the good work!

## 2017-08-28 NOTE — Progress Notes (Signed)
Subjective:    Patient ID: Nicole Quinn, female    DOB: [birth year], 42 y.o.   MRN: [id number]  HPI  Patient presents today for complete physical.  Immunizations: tdap 2015 Diet:  Has been doing intermittent fasting and watching what she eats Exercise: kickboxing 3-4 times a week Pap Smear: hysterectomy Mammogram:  Due Vision: 5/19 Dental:  Up to date  Wt Readings from Last 3 Encounters:  08/28/17 202 lb (91.6 kg)  05/12/17 226 lb 3.2 oz (102.6 kg)  05/01/17 218 lb (98.9 kg)   Hypokalemia- maintained on aldactone and Kdur 69meq TID.  Reports good compliance.  HTN- maintained on toprol xl 150mg  once daily and aldactone.   BP Readings from Last 3 Encounters:  08/28/17 (!) 140/93  05/12/17 (!) 132/92  05/01/17 122/84   Depression/anxiety- back in February we recommended the following:  Decrease effexor to 37.5mg  once daily for 1 week, then stop. Start cymbalta 30mg  once daily for 1 week (1 tab) then increase to 60mg  (2 tabs) once daily.   Review of Systems  Constitutional: Negative for unexpected weight change.  HENT: Negative for hearing loss and rhinorrhea.        Occasional right ear discomfort  Eyes: Negative for visual disturbance.  Respiratory: Negative for cough and shortness of breath.   Cardiovascular: Negative for chest pain.       Occasional LE edema  Gastrointestinal: Negative for blood in stool, constipation and diarrhea.  Genitourinary: Negative for dysuria, frequency and hematuria.  Musculoskeletal: Negative for arthralgias and myalgias.  Skin: Negative for rash.  Neurological: Negative for headaches.  Hematological: Negative for adenopathy.  Psychiatric/Behavioral:       Denies depression/anxiety     Past Medical History:  Diagnosis Date  . Allergy   . Anemia   . Endometriosis   . History of echocardiogram    Echo 6/18: EF 60-65, no RWMA, normal diastolic function  . History of nuclear stress test    Myoview 6/18: EF 60, normal perfusion, Low  Risk  . History of UTI   . Hypertension    pregnancy induced HTN/pre-eclampsia //  on meds since 2nd child (2000)     Social History   Socioeconomic History  . Marital status: Married    Spouse name: Not on file  . Number of children: 2  . Years of education: Not on file  . Highest education level: Not on file  Occupational History    Employer: [employer]  . Financial resource strain: Not on file  . Food insecurity:    Worry: Not on file    Inability: Not on file  . Transportation needs:    Medical: Not on file    Non-medical: Not on file  Tobacco Use  . Smoking status: Never Smoker  . Smokeless tobacco: Never Used  Substance and Sexual Activity  . Alcohol use: No    Alcohol/week: 0.0 standard drinks  . Drug use: No  . Sexual activity: Not on file  Lifestyle  . Physical activity:    Days per week: Not on file    Minutes per session: Not on file  . Stress: Not on file  Relationships  . Social connections:    Talks on phone: Not on file    Gets together: Not on file    Attends religious service: Not on file    Active member of club or organization: Not on file  Attends meetings of clubs or organizations: Not on file    Relationship status: Not on file  . Intimate partner violence:    Fear of current or ex partner: Not on file    Emotionally abused: Not on file    Physically abused: Not on file    Forced sexual activity: Not on file  Other Topics Concern  . Not on file  Social History Narrative   Regular exercise: yes.   Married.  Lives with one child.  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         Past Surgical History:  Procedure Laterality Date  . ABDOMINAL HYSTERECTOMY  2003   partial  . CARPAL TUNNEL RELEASE     right  . GANGLION CYST EXCISION     right  . Hammer Toe Repair Bilateral 12/29/2013   Lt #3, #4, Rt #2.    Family History  Problem Relation Age  of Onset  . Allergies Daughter   . Allergic rhinitis Daughter   . Allergies Son   . Allergic rhinitis Son   . Leukemia Paternal Grandmother   . Arthritis Mother   . Hyperlipidemia Mother   . Hypertension Mother   . Stroke Mother   . Allergic rhinitis Mother   . Hypertension Father   . Diabetes Father   . Arthritis Maternal Grandmother   . Stroke Maternal Grandmother 50  . Hypertension Maternal Grandmother   . Heart attack Maternal Grandmother 50  . Coronary artery disease Maternal Aunt 50  . Coronary artery disease Maternal Aunt 50  . Angioedema Neg Hx   . Asthma Neg Hx   . Eczema Neg Hx   . Immunodeficiency Neg Hx   . Urticaria Neg Hx     Allergies  Allergen Reactions  . Amoxicillin     rash  . Ace Inhibitors     REACTION: lip swelling    Current Outpatient Medications on File Prior to Visit  Medication Sig Dispense Refill  . cloNIDine (CATAPRES) 0.1 MG tablet TAKE 1 TABLET TWICE A DAY 60 tablet 5  . DULoxetine (CYMBALTA) 30 MG capsule 1 tablet by mouth once daily for 1 week, then increase to 2 tabs once daily on week two (Patient taking differently: Take 30 mg by mouth daily. ) 60 capsule 0  . fluticasone (FLONASE) 50 MCG/ACT nasal spray Place 2 sprays into both nostrils daily as needed for allergies or rhinitis. 18.2 g 5  . levocetirizine (XYZAL) 5 MG tablet Take 1 tablet (5 mg total) by mouth every evening. 30 tablet 5  . metoprolol succinate (TOPROL XL) 100 MG 24 hr tablet Take 1.5 tablets by mouth once daily. 135 tablet 1  . Multiple Vitamin (MULTIVITAMIN) tablet Take 1 tablet by mouth daily.      Marland Kitchen olopatadine (PATANOL) 0.1 % ophthalmic solution Place 1 drop into both eyes 2 (two) times daily as needed for allergies. 5 mL 5  . potassium chloride SA (K-DUR,KLOR-CON) 20 MEQ tablet Take 2 tablets (40 mEq total) by mouth 3 (three) times daily. (Patient taking differently: Take 40 mEq by mouth 4 (four) times daily. ) 180 tablet 3  . spironolactone (ALDACTONE) 50 MG  tablet Take 1 tablet (50 mg total) by mouth daily. 90 tablet 1  . venlafaxine XR (EFFEXOR XR) 37.5 MG 24 hr capsule 1 tab by mouth once daily for 1 week then stop (Patient taking differently: Take 37.5 mg by mouth daily with breakfast. ) 7 capsule 0   No current  facility-administered medications on file prior to visit.     BP (!) 140/93 (BP Location: Right Arm, Patient Position: Sitting, Cuff Size: Small)   Pulse 79   Temp 98.4 F (36.9 C) (Oral)   Resp 16   Ht 5\' 4"  (1.626 m)   Wt 202 lb (91.6 kg)   SpO2 100%   BMI 34.67 kg/m       Objective:   Physical Exam  Physical Exam  Constitutional: She is oriented to person, place, and time. She appears well-developed and well-nourished. No distress.  HENT:  Head: Normocephalic and atraumatic.  Right Ear: Tympanic membrane and ear canal normal.  Left Ear: Tympanic membrane and ear canal normal.  Mouth/Throat: Oropharynx is clear and moist.  Eyes: Pupils are equal, round, and reactive to light. No scleral icterus.  Neck: Normal range of motion. No thyromegaly present.  Cardiovascular: Normal rate and regular rhythm.   No murmur heard. Pulmonary/Chest: Effort normal and breath sounds normal. No respiratory distress. He has no wheezes. She has no rales. She exhibits no tenderness.  Abdominal: Soft. Bowel sounds are normal. She exhibits no distension and no mass. There is no tenderness. There is no rebound and no guarding.  Musculoskeletal: She exhibits no edema.  Lymphadenopathy:    She has no cervical adenopathy.  Neurological: She is alert and oriented to person, place, and time. She has normal patellar reflexes. She exhibits normal muscle tone. Coordination normal.  Skin: Skin is warm and dry.  Psychiatric: She has a normal mood and affect. Her behavior is normal. Judgment and thought content normal.  Breasts: Examined lying Right: Without masses, retractions, discharge or axillary adenopathy.  Left: Without masses, retractions,  discharge or axillary adenopathy. Lt: Without masses or tenderness.  Anus and perineum: Not examined           Assessment & Plan:   Preventative care- commended pt on her weight loss.  Continue healthy diet, exercise, and weight loss efforts. EKG tracing is personally reviewed.  EKG notes NSR.  No acute changes. Note is made of TWI in V3, but I see that this has been present before on a previous EKG.  Monitor.      Assessment & Plan:

## 2017-09-11 ENCOUNTER — Other Ambulatory Visit: Payer: Self-pay | Admitting: Family

## 2017-09-12 ENCOUNTER — Ambulatory Visit (HOSPITAL_BASED_OUTPATIENT_CLINIC_OR_DEPARTMENT_OTHER)
Admission: RE | Admit: 2017-09-12 | Discharge: 2017-09-12 | Disposition: A | Discharge status: Home / Self Care | Payer: BLUE CROSS/BLUE SHIELD | Source: Ambulatory Visit | Attending: Family | Admitting: Family

## 2017-09-12 DIAGNOSIS — Z0000 Encounter for general adult medical examination without abnormal findings: Secondary | ICD-10-CM | POA: Insufficient documentation

## 2017-09-12 DIAGNOSIS — Z1231 Encounter for screening mammogram for malignant neoplasm of breast: Secondary | ICD-10-CM | POA: Diagnosis not present

## 2017-09-15 ENCOUNTER — Other Ambulatory Visit: Payer: Self-pay | Admitting: Family

## 2017-09-15 DIAGNOSIS — R928 Other abnormal and inconclusive findings on diagnostic imaging of breast: Secondary | ICD-10-CM

## 2017-09-18 ENCOUNTER — Ambulatory Visit
Admission: RE | Admit: 2017-09-18 | Discharge: 2017-09-18 | Disposition: A | Discharge status: Home / Self Care | Payer: BLUE CROSS/BLUE SHIELD | Source: Ambulatory Visit | Attending: Family | Admitting: Family

## 2017-09-18 ENCOUNTER — Other Ambulatory Visit: Payer: Self-pay | Admitting: Family

## 2017-09-18 DIAGNOSIS — N6322 Unspecified lump in the left breast, upper inner quadrant: Secondary | ICD-10-CM | POA: Diagnosis not present

## 2017-09-18 DIAGNOSIS — R928 Other abnormal and inconclusive findings on diagnostic imaging of breast: Secondary | ICD-10-CM | POA: Diagnosis not present

## 2017-09-22 ENCOUNTER — Telehealth: Payer: Self-pay | Admitting: *Deleted

## 2017-09-22 ENCOUNTER — Other Ambulatory Visit: Payer: BLUE CROSS/BLUE SHIELD

## 2017-09-22 MED ORDER — SPIRONOLACTONE 50 MG PO TABS: 50.0000 mg | ORAL_TABLET | Freq: Every day | ORAL | 0 refills | Status: DC

## 2017-09-22 NOTE — Telephone Encounter (Signed)
Received Physician Orders from [facility]; forwarded to provider/SLS 09/03

## 2017-09-22 NOTE — Telephone Encounter (Signed)
Received refill request from [facility] for spironolactone 50mg . Refill sent.

## 2017-09-25 ENCOUNTER — Ambulatory Visit
Admission: RE | Admit: 2017-09-25 | Discharge: 2017-09-25 | Disposition: A | Discharge status: Home / Self Care | Payer: BLUE CROSS/BLUE SHIELD | Source: Ambulatory Visit | Attending: Family | Admitting: Family

## 2017-09-25 ENCOUNTER — Other Ambulatory Visit: Payer: Self-pay | Admitting: Family

## 2017-09-25 DIAGNOSIS — N6322 Unspecified lump in the left breast, upper inner quadrant: Secondary | ICD-10-CM | POA: Diagnosis not present

## 2017-09-25 DIAGNOSIS — R928 Other abnormal and inconclusive findings on diagnostic imaging of breast: Secondary | ICD-10-CM

## 2017-09-25 DIAGNOSIS — D242 Benign neoplasm of left breast: Secondary | ICD-10-CM | POA: Diagnosis not present

## 2017-10-01 HISTORY — PX: BREAST BIOPSY: SHX20

## 2017-10-20 ENCOUNTER — Encounter: Payer: Self-pay | Admitting: Family

## 2017-10-20 DIAGNOSIS — I10 Essential (primary) hypertension: Secondary | ICD-10-CM

## 2017-10-28 DIAGNOSIS — Z23 Encounter for immunization: Secondary | ICD-10-CM | POA: Diagnosis not present

## 2017-10-28 DIAGNOSIS — L700 Acne vulgaris: Secondary | ICD-10-CM | POA: Diagnosis not present

## 2017-10-30 ENCOUNTER — Other Ambulatory Visit: Payer: Self-pay

## 2017-10-30 ENCOUNTER — Telehealth: Payer: Self-pay | Admitting: Family

## 2017-10-30 ENCOUNTER — Other Ambulatory Visit (INDEPENDENT_AMBULATORY_CARE_PROVIDER_SITE_OTHER): Payer: BLUE CROSS/BLUE SHIELD

## 2017-10-30 DIAGNOSIS — I10 Essential (primary) hypertension: Secondary | ICD-10-CM | POA: Diagnosis not present

## 2017-10-30 DIAGNOSIS — E876 Hypokalemia: Secondary | ICD-10-CM

## 2017-10-30 LAB — BASIC METABOLIC PANEL
BUN: 9 mg/dL (ref 6–23)
CO2: 36 mEq/L — ABNORMAL HIGH (ref 19–32)
Calcium: 9.6 mg/dL (ref 8.4–10.5)
Chloride: 93 mEq/L — ABNORMAL LOW (ref 96–112)
Creatinine, Ser: 0.92 mg/dL (ref 0.40–1.20)
GFR: 86.66 mL/min (ref >60.00)
Glucose, Bld: 86 mg/dL (ref 70–99)
Potassium: 2.9 mEq/L — ABNORMAL LOW (ref 3.5–5.1)
Sodium: 139 mEq/L (ref 135–145)

## 2017-10-30 NOTE — Telephone Encounter (Signed)
Please let pt know that potassium is low.  Has she missed any doses?  If so, please restart 55meq TID and repeat bmet in 1 week.  If she has not missed any doses, then I would recommend that she take 40 meq 4 x daily and repeat bmet on Monday 10/14.

## 2017-10-30 NOTE — Progress Notes (Unsigned)
b

## 2017-10-30 NOTE — Telephone Encounter (Signed)
Pt given lab results per notes of Debbrah Alar on 10/30/17 below. Pt verbalized understanding. Patient says she is taking her potassium every day and not missed any doses, 40 meq TID. I advised to increase to QID, she verbalized understanding, lab appointment scheduled for Monday, 11/02/17 at 0700.

## 2017-10-30 NOTE — Telephone Encounter (Signed)
Lm for patient to call back for results. Ashley for triage nurse to discuss result with patient.

## 2017-11-02 ENCOUNTER — Other Ambulatory Visit (INDEPENDENT_AMBULATORY_CARE_PROVIDER_SITE_OTHER): Payer: BLUE CROSS/BLUE SHIELD

## 2017-11-02 DIAGNOSIS — E876 Hypokalemia: Secondary | ICD-10-CM | POA: Diagnosis not present

## 2017-11-02 LAB — BASIC METABOLIC PANEL
BUN: 16 mg/dL (ref 6–23)
CO2: 34 mEq/L — ABNORMAL HIGH (ref 19–32)
Calcium: 10.3 mg/dL (ref 8.4–10.5)
Chloride: 94 mEq/L — ABNORMAL LOW (ref 96–112)
Creatinine, Ser: 0.92 mg/dL (ref 0.40–1.20)
GFR: 86.66 mL/min (ref >60.00)
Glucose, Bld: 98 mg/dL (ref 70–99)
Potassium: 3.5 mEq/L (ref 3.5–5.1)
Sodium: 137 mEq/L (ref 135–145)

## 2017-11-18 ENCOUNTER — Encounter: Payer: Self-pay | Admitting: Family

## 2017-11-26 DIAGNOSIS — K600 Acute anal fissure: Secondary | ICD-10-CM | POA: Diagnosis not present

## 2017-11-26 DIAGNOSIS — K5904 Chronic idiopathic constipation: Secondary | ICD-10-CM | POA: Diagnosis not present

## 2017-11-26 DIAGNOSIS — K6289 Other specified diseases of anus and rectum: Secondary | ICD-10-CM | POA: Diagnosis not present

## 2017-11-26 DIAGNOSIS — E669 Obesity, unspecified: Secondary | ICD-10-CM | POA: Diagnosis not present

## 2017-12-17 ENCOUNTER — Other Ambulatory Visit: Payer: Self-pay | Admitting: Family

## 2017-12-22 DIAGNOSIS — K602 Anal fissure, unspecified: Secondary | ICD-10-CM | POA: Diagnosis not present

## 2017-12-22 DIAGNOSIS — K5904 Chronic idiopathic constipation: Secondary | ICD-10-CM | POA: Diagnosis not present

## 2017-12-22 DIAGNOSIS — E669 Obesity, unspecified: Secondary | ICD-10-CM | POA: Diagnosis not present

## 2018-02-10 ENCOUNTER — Encounter: Payer: Self-pay | Admitting: Family

## 2018-02-12 ENCOUNTER — Ambulatory Visit (INDEPENDENT_AMBULATORY_CARE_PROVIDER_SITE_OTHER): Payer: BLUE CROSS/BLUE SHIELD | Admitting: Family Medicine

## 2018-02-12 ENCOUNTER — Other Ambulatory Visit: Payer: Self-pay | Admitting: Family

## 2018-02-12 ENCOUNTER — Encounter: Payer: Self-pay | Admitting: Family Medicine

## 2018-02-12 VITALS — BP 136/88 | HR 78 | Temp 98.4°F | Resp 16 | Ht 64.0 in | Wt 193.8 lb

## 2018-02-12 DIAGNOSIS — M2550 Pain in unspecified joint: Secondary | ICD-10-CM

## 2018-02-12 DIAGNOSIS — R300 Dysuria: Secondary | ICD-10-CM | POA: Diagnosis not present

## 2018-02-12 LAB — POC URINALSYSI DIPSTICK (AUTOMATED)
Bilirubin, UA: NEGATIVE
Blood, UA: NEGATIVE
Glucose, UA: NEGATIVE
Ketones, UA: NEGATIVE
Leukocytes, UA: NEGATIVE
Nitrite, UA: NEGATIVE
Protein, UA: NEGATIVE
Spec Grav, UA: >=1.03 — AB (ref 1.010–1.025)
Urobilinogen, UA: 0.2 E.U./dL
pH, UA: 6 (ref 5.0–8.0)

## 2018-02-12 MED ORDER — CIPROFLOXACIN HCL 250 MG PO TABS: 250.0000 mg | ORAL_TABLET | Freq: Two times a day (BID) | ORAL | 0 refills | Status: DC

## 2018-02-12 NOTE — Patient Instructions (Signed)
Joint Pain Joint pain (arthralgia) may be caused by many things. Joint pain is likely to go away when you follow instructions from your health care provider for relieving pain at home. However, joint pain can also be caused by conditions that require more treatment. Common causes of joint pain include:  Bruising in the area of the joint.  Injury caused by repeating certain movements too many times (overuse injury).  Age-related joint wear and tear (osteoarthritis).  Buildup of uric acid crystals in the joint (gout).  Inflammation of the joint (rheumatic disease).  Various other forms of arthritis.  Infections of the joint (septic arthritis) or of the bone (osteomyelitis). Your health care provider may recommend that you take pain medicine or wear a supportive device like an elastic bandage, sling, or splint. If your joint pain continues, you may need lab or imaging tests to diagnose the cause of your joint pain. Follow these instructions at home: Managing pain, stiffness, and swelling   If directed, put ice on the painful area. Icing can help to relieve joint pain and swelling. ? Put ice in a plastic bag. ? Place a towel between your skin and the bag. ? Leave the ice on for 20 minutes, 2-3 times a day.  If directed, apply heat to the painful area as often as told by your health care provider. Heat can reduce the stiffness of your muscles and joints. Use the heat source that your health care provider recommends, such as a moist heat pack or a heating pad. ? Place a towel between your skin and the heat source. ? Leave the heat on for 20-30 minutes. ? Remove the heat if your skin turns bright red. This is especially important if you are unable to feel pain, heat, or cold. You may have a greater risk of getting burned.  Move your fingers or toes below the painful joint often. You can avoid stiffness and lessen swelling by doing this.  If possible, raise (elevate) the painful joint above  the level of your heart while you are sitting or lying down. To do this, try putting a few pillows under the painful joint. Activity  Rest the painful joint for as long as directed. Do not do anything that causes or worsens pain.  Begin exercising or stretching the affected area, as told by your health care provider. Ask your health care provider what types of exercise are safe for you. If you have an elastic bandage, sling, or splint:  Wear the supportive device as told by your health care provider. Remove it only as told by your health care provider.  Loosen the device if your fingers or toes below the joint tingle, become numb, or turn cold and blue.  Keep the device clean.  Ask your health care provider if you should remove the device before bathing. You may need to cover it with a watertight covering when you take a bath or a shower. General instructions  Take over-the-counter and prescription medicines only as told by your health care provider.  Do not use any products that contain nicotine or tobacco, such as cigarettes and e-cigarettes. If you need help quitting, ask your health care provider.  Keep all follow-up visits as told by your health care provider. This is important. Contact a health care provider if:  You have pain that gets worse and does not get better with medicine.  Your joint pain does not improve within 3 days.  You have increased bruising or swelling.  3 days.  You have increased bruising or swelling.  You have a fever.  You lose 10 lb (4.5 kg) or more without trying.  Get help right away if:  You cannot move the joint.  Your fingers or toes tingle, become numb, or turn cold and blue.  You have a fever along with a joint that is red, warm, and swollen.  Summary  Joint pain (arthralgia) may be caused by many things.  Your health care provider may recommend that you take pain medicine or wear a supportive device like an elastic bandage, sling, or splint.  If your joint pain continues, you may need tests to diagnose the cause of your joint pain.  Take over-the-counter and  prescription medicines only as told by your health care provider.  This information is not intended to replace advice given to you by your health care provider. Make sure you discuss any questions you have with your health care provider.  Document Released: 01/06/2005 Document Revised: 10/22/2016 Document Reviewed: 10/22/2016  Elsevier Interactive Patient Education  2019 [employer]

## 2018-02-12 NOTE — Progress Notes (Signed)
Patient ID: Nicole Quinn, female    DOB: 02-07-1975  Age: 43 y.o. MRN: [id number]    Subjective:  Subjective  HPI [name] presents for dysuria but then mentioned her joint swelling and pain that has been going on for months.  She has been to rheum and had lalbs done with her pcp.     Symptoms seemed to resolve but not 100% and then flared again.  Pt states she has not been able to wear her rings due to swelling no sob,  No chest pain.  No vaginal d/c or discomfort.    Review of Systems  Constitutional: Negative for appetite change, chills, diaphoresis, fatigue, fever and unexpected weight change.  HENT: Negative for congestion and hearing loss.   Eyes: Negative for pain, discharge, redness and visual disturbance.  Respiratory: Negative for cough, chest tightness, shortness of breath and wheezing.   Cardiovascular: Negative for chest pain, palpitations and leg swelling.  Gastrointestinal: Negative for abdominal pain, blood in stool, constipation, diarrhea, nausea and vomiting.  Endocrine: Negative for cold intolerance, heat intolerance, polydipsia, polyphagia and polyuria.  Genitourinary: Positive for dysuria. Negative for difficulty urinating, frequency, hematuria and urgency.  Musculoskeletal: Positive for arthralgias and joint swelling. Negative for back pain and myalgias.  Skin: Negative for rash.  Allergic/Immunologic: Negative for environmental allergies.  Neurological: Negative for dizziness, weakness, light-headedness, numbness and headaches.  Hematological: Does not bruise/bleed easily.  Psychiatric/Behavioral: Negative for suicidal ideas. The patient is not nervous/anxious.     History Past Medical History:  Diagnosis Date  . Allergy   . Anemia   . Endometriosis   . History of echocardiogram    Echo 6/18: EF 60-65, no RWMA, normal diastolic function  . History of nuclear stress test    Myoview 6/18: EF 60, normal perfusion, Low Risk  . History of UTI   .  Hypertension    pregnancy induced HTN/pre-eclampsia //  on meds since 2nd child (2000)    She has a past surgical history that includes Abdominal hysterectomy (2003); Carpal tunnel release; Ganglion cyst excision; and Hammer Toe Repair (Bilateral, 12/29/2013).   Her family history includes Allergic rhinitis in her daughter, mother, and son; Allergies in her daughter and son; Arthritis in her maternal grandmother and mother; Coronary artery disease (age of onset: 20) in her maternal aunt and maternal aunt; Diabetes in her father; Heart attack (age of onset: 74) in her maternal grandmother; Hyperlipidemia in her mother; Hypertension in her father, maternal grandmother, and mother; Leukemia in her paternal grandmother; Stroke in her mother; Stroke (age of onset: 76) in her maternal grandmother.She reports that she has never smoked. She has never used smokeless tobacco. She reports that she does not drink alcohol or use drugs.  Current Outpatient Medications on File Prior to Visit  Medication Sig Dispense Refill  . cloNIDine (CATAPRES) 0.1 MG tablet TAKE 1 TABLET TWICE A DAY 60 tablet 5  . fluticasone (FLONASE) 50 MCG/ACT nasal spray Place 2 sprays into both nostrils daily as needed for allergies or rhinitis. 18.2 g 5  . levocetirizine (XYZAL) 5 MG tablet Take 1 tablet (5 mg total) by mouth every evening. 30 tablet 5  . metoprolol succinate (TOPROL XL) 100 MG 24 hr tablet Take 1.5 tablets by mouth once daily. 135 tablet 1  . Multiple Vitamin (MULTIVITAMIN) tablet Take 1 tablet by mouth daily.      Marland Kitchen olopatadine (PATANOL) 0.1 % ophthalmic solution Place 1 drop into both eyes 2 (two) times daily as needed for  allergies. 5 mL 5  . potassium chloride SA (K-DUR,KLOR-CON) 20 MEQ tablet Take 2 tablets (40 mEq total) by mouth 3 (three) times daily. (Patient taking differently: Take 40 mEq by mouth 4 (four) times daily. ) 180 tablet 3  . spironolactone (ALDACTONE) 50 MG tablet TAKE 1 TABLET DAILY 90 tablet 0    No current facility-administered medications on file prior to visit.      Objective:  Objective  Physical Exam Vitals signs and nursing note reviewed.  Constitutional:      Appearance: She is well-developed.  HENT:     Head: Normocephalic and atraumatic.  Eyes:     Conjunctiva/sclera: Conjunctivae normal.  Neck:     Musculoskeletal: Normal range of motion and neck supple.     Thyroid: No thyromegaly.     Vascular: No carotid bruit or JVD.  Cardiovascular:     Rate and Rhythm: Normal rate and regular rhythm.     Heart sounds: Normal heart sounds. No murmur.  Pulmonary:     Effort: Pulmonary effort is normal. No respiratory distress.     Breath sounds: Normal breath sounds. No wheezing or rales.  Chest:     Chest wall: No tenderness.  Neurological:     Mental Status: She is alert and oriented to person, place, and time.    BP 136/88 (BP Location: Right Arm, Cuff Size: Normal)   Pulse 78   Temp 98.4 F (36.9 C) (Oral)   Resp 16   Ht '5\' 4"'$  (1.626 m)   Wt 193 lb 12.8 oz (87.9 kg)   SpO2 99%   BMI 33.27 kg/m  Wt Readings from Last 3 Encounters:  02/12/18 193 lb 12.8 oz (87.9 kg)  08/28/17 202 lb (91.6 kg)  05/12/17 226 lb 3.2 oz (102.6 kg)     Lab Results  Component Value Date   WBC 7.3 08/28/2017   HGB 12.1 08/28/2017   HCT 35.5 (L) 08/28/2017   PLT 337.0 08/28/2017   GLUCOSE 98 11/02/2017   CHOL 203 (H) 08/28/2017   TRIG 52.0 08/28/2017   HDL 43.20 08/28/2017   LDLCALC 149 (H) 08/28/2017   ALT 11 08/28/2017   AST 15 08/28/2017   NA 137 11/02/2017   K 3.5 11/02/2017   CL 94 (L) 11/02/2017   CREATININE 0.92 11/02/2017   BUN 16 11/02/2017   CO2 34 (H) 11/02/2017   TSH 0.73 08/28/2017   INR 0.93 06/05/2016   HGBA1C 5.5 05/20/2013    Mm Clip Placement Left  Result Date: 09/25/2017 CLINICAL DATA:  Post biopsy mammogram of the left breast for clip placement. EXAM: DIAGNOSTIC LEFT MAMMOGRAM POST ULTRASOUND BIOPSY COMPARISON:  Previous exam(s). FINDINGS:  Mammographic images were obtained following ultrasound guided biopsy of a mass in the left breast at 10 o'clock. The ribbon shaped biopsy marking clip is well positioned at the site of the biopsied mass at 10 o'clock. IMPRESSION: Appropriate positioning of the ribbon shaped biopsy marking clip at 10 o'clock in the left breast. Final Assessment: Post Procedure Mammograms for Marker Placement Electronically Signed   By: Ammie Ferrier M.D.   On: 09/25/2017 09:35   [place] Lt Breast Bx W Loc Dev 1st Lesion Img Bx Spec US Guide  Addendum Date: 10/01/2017   ADDENDUM REPORT: 09/28/2017 10:36 ADDENDUM: Pathology revealed FIBROADENOMA of LEFT breast, 10 o'clock. This was found to be concordant by Dr. Ammie Ferrier. Pathology results were discussed with the patient by telephone. The patient reported doing well after the biopsy with tenderness at  the site. Post biopsy instructions and care were reviewed and questions were answered. The patient was encouraged to call [facility] for any additional concerns. The patient was instructed to return for annual screening mammography and informed a reminder notice would be sent regarding this appointment. Pathology results reported by Roselind Messier, RN on 09/28/2017. Electronically Signed   By: Ammie Ferrier M.D.   On: 09/28/2017 10:36   Result Date: 10/01/2017 CLINICAL DATA:  43 year old female presenting for ultrasound-guided biopsy of a left breast mass. EXAM: ULTRASOUND GUIDED LEFT BREAST CORE NEEDLE BIOPSY COMPARISON:  Previous exam(s). FINDINGS: I met with the patient and we discussed the procedure of ultrasound-guided biopsy, including benefits and alternatives. We discussed the high likelihood of a successful procedure. We discussed the risks of the procedure, including infection, bleeding, tissue injury, clip migration, and inadequate sampling. Informed written consent was given. The usual time-out protocol was performed immediately prior to  the procedure. Lesion quadrant: Upper-inner quadrant Using sterile technique and 1% Lidocaine as local anesthetic, under direct ultrasound visualization, a 14 gauge spring-loaded device was used to perform biopsy of a mass in the left breast at 10 o'clock using a lateral approach. At the conclusion of the procedure a ribbon shaped tissue marker clip was deployed into the biopsy cavity. Follow up 2 view mammogram was performed and dictated separately. IMPRESSION: Ultrasound guided biopsy of left breast mass at 10 o'clock. No apparent complications. Electronically Signed: By: Ammie Ferrier M.D. On: 09/25/2017 09:19     Assessment & Plan:  Plan  I am having Cerise Meece start on ciprofloxacin. I am also having her maintain her multivitamin, levocetirizine, fluticasone, olopatadine, cloNIDine, metoprolol succinate, potassium chloride SA, and spironolactone.  Meds ordered this encounter  Medications  . ciprofloxacin (CIPRO) 250 MG tablet    Sig: Take 1 tablet (250 mg total) by mouth 2 (two) times daily.    Dispense:  6 tablet    Refill:  0    Problem List Items Addressed This Visit      Unprioritized   Arthralgia    Pt has seen rheum, had some labs done-- symptoms improved and then flared again Check labs next week-- due to lab being closed      Relevant Orders   CBC with Differential/Platelet   Comprehensive metabolic panel   Thyroid Panel With TSH   Vitamin B12   Vitamin D (25 hydroxy)   Antinuclear Antib (ANA)   Rheumatoid Factor   Sedimentation rate   Dysuria - Primary    ua normal Unable to do culture due to lab being closed  cipro for 3 days  Urine may need to be repeated if symptoms do not resolve       Relevant Medications   ciprofloxacin (CIPRO) 250 MG tablet   Other Relevant Orders   POCT Urinalysis Dipstick (Automated) (Completed)      Follow-up: Return in about 2 weeks (around 02/26/2018), or if symptoms worsen or fail to improve, for with pcp.  Ann Held, DO

## 2018-02-13 DIAGNOSIS — M2550 Pain in unspecified joint: Secondary | ICD-10-CM | POA: Insufficient documentation

## 2018-02-13 DIAGNOSIS — R300 Dysuria: Secondary | ICD-10-CM | POA: Insufficient documentation

## 2018-02-13 NOTE — Assessment & Plan Note (Signed)
ua normal Unable to do culture due to lab being closed  cipro for 3 days  Urine may need to be repeated if symptoms do not resolve

## 2018-02-13 NOTE — Assessment & Plan Note (Signed)
Pt has seen rheum, had some labs done-- symptoms improved and then flared again Check labs next week-- due to lab being closed

## 2018-02-15 ENCOUNTER — Other Ambulatory Visit (INDEPENDENT_AMBULATORY_CARE_PROVIDER_SITE_OTHER): Payer: BLUE CROSS/BLUE SHIELD

## 2018-02-15 DIAGNOSIS — M2550 Pain in unspecified joint: Secondary | ICD-10-CM | POA: Diagnosis not present

## 2018-02-15 DIAGNOSIS — E559 Vitamin D deficiency, unspecified: Secondary | ICD-10-CM

## 2018-02-15 DIAGNOSIS — E876 Hypokalemia: Secondary | ICD-10-CM

## 2018-02-15 LAB — COMPREHENSIVE METABOLIC PANEL
ALT: 11 U/L (ref 0–35)
AST: 13 U/L (ref 0–37)
Albumin: 4 g/dL (ref 3.5–5.2)
Alkaline Phosphatase: 79 U/L (ref 39–117)
BUN: 8 mg/dL (ref 6–23)
CO2: 30 mEq/L (ref 19–32)
Calcium: 9.5 mg/dL (ref 8.4–10.5)
Chloride: 98 mEq/L (ref 96–112)
Creatinine, Ser: 0.73 mg/dL (ref 0.40–1.20)
GFR: 106.33 mL/min (ref >60.00)
Glucose, Bld: 88 mg/dL (ref 70–99)
Potassium: 3.3 mEq/L — ABNORMAL LOW (ref 3.5–5.1)
Sodium: 137 mEq/L (ref 135–145)
Total Bilirubin: 0.3 mg/dL (ref 0.2–1.2)
Total Protein: 6.8 g/dL (ref 6.0–8.3)

## 2018-02-15 LAB — CBC WITH DIFFERENTIAL/PLATELET
Basophils Absolute: 0 10*3/uL (ref 0.0–0.1)
Basophils Relative: 0.4 % (ref 0.0–3.0)
Eosinophils Absolute: 0.1 10*3/uL (ref 0.0–0.7)
Eosinophils Relative: 1 % (ref 0.0–5.0)
HCT: 36 % (ref 36.0–46.0)
Hemoglobin: 12.1 g/dL (ref 12.0–15.0)
Lymphocytes Relative: 40 % (ref 12.0–46.0)
Lymphs Abs: 2.7 10*3/uL (ref 0.7–4.0)
MCHC: 33.7 g/dL (ref 30.0–36.0)
MCV: 92.1 fl (ref 78.0–100.0)
Monocytes Absolute: 0.4 10*3/uL (ref 0.1–1.0)
Monocytes Relative: 6.2 % (ref 3.0–12.0)
Neutro Abs: 3.5 10*3/uL (ref 1.4–7.7)
Neutrophils Relative %: 52.4 % (ref 43.0–77.0)
Platelets: 413 10*3/uL — ABNORMAL HIGH (ref 150.0–400.0)
RBC: 3.91 Mil/uL (ref 3.87–5.11)
RDW: 13.1 % (ref 11.5–15.5)
WBC: 6.7 10*3/uL (ref 4.0–10.5)

## 2018-02-15 LAB — VITAMIN D 25 HYDROXY (VIT D DEFICIENCY, FRACTURES): VITD: 24.41 ng/mL — ABNORMAL LOW (ref 30.00–100.00)

## 2018-02-15 LAB — SEDIMENTATION RATE: Sed Rate: 51 mm/hr — ABNORMAL HIGH (ref 0–20)

## 2018-02-15 LAB — VITAMIN B12: Vitamin B-12: >1525 pg/mL — ABNORMAL HIGH (ref 211–911)

## 2018-02-16 LAB — THYROID PANEL WITH TSH
Free Thyroxine Index: 1.8 (ref 1.4–3.8)
T3 Uptake: 27 % (ref 22–35)
T4, Total: 6.7 ug/dL (ref 5.1–11.9)
TSH: 0.92 mIU/L

## 2018-02-16 LAB — ANA: Anti Nuclear Antibody(ANA): NEGATIVE

## 2018-02-16 LAB — RHEUMATOID FACTOR: Rhuematoid fact SerPl-aCnc: <14 IU/mL (ref <14)

## 2018-02-17 ENCOUNTER — Encounter: Payer: Self-pay | Admitting: Family Medicine

## 2018-02-17 MED ORDER — VITAMIN D (ERGOCALCIFEROL) 1.25 MG (50000 UNIT) PO CAPS: 50000.0000 [IU] | ORAL_CAPSULE | ORAL | 0 refills | Status: DC

## 2018-02-17 NOTE — Addendum Note (Signed)
Addended byDamita Dunnings D on: 02/17/2018 02:33 PM   Modules accepted: Orders

## 2018-02-18 NOTE — Telephone Encounter (Signed)
Vita d is commonly low esp in women---  This can cause muscle / joint pains --- so replacing it may help this  The potassium can be dietary--- you are not taking any medication that can lower it The sed rate is a sign of inflammation in the body--- I would like to repeat this in 3 months with the vita d

## 2018-02-26 ENCOUNTER — Ambulatory Visit: Payer: BLUE CROSS/BLUE SHIELD | Admitting: Family

## 2018-03-22 ENCOUNTER — Other Ambulatory Visit: Payer: Self-pay | Admitting: Family

## 2018-03-22 NOTE — Telephone Encounter (Signed)
Nicole Quinn -- please advise spironolactone request. Pt seen by you 08/28/17 and advised 3 month follow up. She was last seen for acute issue on 02/12/18 and has no future appts scheduled.

## 2018-03-30 ENCOUNTER — Ambulatory Visit (INDEPENDENT_AMBULATORY_CARE_PROVIDER_SITE_OTHER): Payer: BLUE CROSS/BLUE SHIELD | Admitting: Family

## 2018-03-30 ENCOUNTER — Telehealth: Payer: Self-pay | Admitting: Family

## 2018-03-30 ENCOUNTER — Encounter: Payer: Self-pay | Admitting: Family

## 2018-03-30 VITALS — BP 130/82 | HR 72 | Temp 98.3°F | Resp 16 | Wt 183.0 lb

## 2018-03-30 DIAGNOSIS — R5383 Other fatigue: Secondary | ICD-10-CM

## 2018-03-30 DIAGNOSIS — E876 Hypokalemia: Secondary | ICD-10-CM

## 2018-03-30 DIAGNOSIS — I10 Essential (primary) hypertension: Secondary | ICD-10-CM | POA: Diagnosis not present

## 2018-03-30 NOTE — Patient Instructions (Signed)
Please complete lab work prior to leaving.   

## 2018-03-30 NOTE — Telephone Encounter (Signed)
Looks like she forgot to go to the lab. Can you please call her and schedule a lab visit?

## 2018-03-30 NOTE — Progress Notes (Signed)
Subjective:    Patient ID: Nicole Quinn, female    DOB: 10/18/1975, 43 y.o.   MRN: [id number]  HPI  Patient is a 43 yr old female who presents today for follow up.   HTN- on clonidine, metoprolol and aldactone.  BP Readings from Last 3 Encounters:  03/30/18 130/82  02/12/18 136/88  08/28/17 (!) 140/93   Hypokalemia- reports that she takes potassium 2-3 times a day. Report that swelling comes and goes.    Did have flu shot   Reports that she is always cold and tired.   Also, reports that husband feels like she has "changed vaginally," and she wonders if this is normal.   Lab Results  Component Value Date   TSH 0.92 02/15/2018   Lab Results  Component Value Date   WBC 6.7 02/15/2018   HGB 12.1 02/15/2018   HCT 36.0 02/15/2018   MCV 92.1 02/15/2018   PLT 413.0 (H) 02/15/2018      Review of Systems See HPI  Past Medical History:  Diagnosis Date  . Allergy   . Anemia   . Endometriosis   . History of echocardiogram    Echo 6/18: EF 60-65, no RWMA, normal diastolic function  . History of nuclear stress test    Myoview 6/18: EF 60, normal perfusion, Low Risk  . History of UTI   . Hypertension    pregnancy induced HTN/pre-eclampsia //  on meds since 2nd child (2000)     Social History   Socioeconomic History  . Marital status: Married    Spouse name: Not on file  . Number of children: 2  . Years of education: Not on file  . Highest education level: Not on file  Occupational History    Employer: Many  . Financial resource strain: Not on file  . Food insecurity:    Worry: Not on file    Inability: Not on file  . Transportation needs:    Medical: Not on file    Non-medical: Not on file  Tobacco Use  . Smoking status: Never Smoker  . Smokeless tobacco: Never Used  Substance and Sexual Activity  . Alcohol use: No    Alcohol/week: 0.0 standard drinks  . Drug  use: No  . Sexual activity: Not on file  Lifestyle  . Physical activity:    Days per week: Not on file    Minutes per session: Not on file  . Stress: Not on file  Relationships  . Social connections:    Talks on phone: Not on file    Gets together: Not on file    Attends religious service: Not on file    Active member of club or organization: Not on file    Attends meetings of clubs or organizations: Not on file    Relationship status: Not on file  . Intimate partner violence:    Fear of current or ex partner: Not on file    Emotionally abused: Not on file    Physically abused: Not on file    Forced sexual activity: Not on file  Other Topics Concern  . Not on file  Social History Narrative   Regular exercise: yes.   Married.  Lives with one child.  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         Past Surgical History:  Procedure Laterality Date  . ABDOMINAL HYSTERECTOMY  2003   partial  .  CARPAL TUNNEL RELEASE     right  . GANGLION CYST EXCISION     right  . Hammer Toe Repair Bilateral 12/29/2013   Lt #3, #4, Rt #2.    Family History  Problem Relation Age of Onset  . Allergies Daughter   . Allergic rhinitis Daughter   . Allergies Son   . Allergic rhinitis Son   . Leukemia Paternal Grandmother   . Arthritis Mother   . Hyperlipidemia Mother   . Hypertension Mother   . Stroke Mother   . Allergic rhinitis Mother   . Hypertension Father   . Diabetes Father   . Arthritis Maternal Grandmother   . Stroke Maternal Grandmother 50  . Hypertension Maternal Grandmother   . Heart attack Maternal Grandmother 50  . Coronary artery disease Maternal Aunt 50  . Coronary artery disease Maternal Aunt 50  . Angioedema Neg Hx   . Asthma Neg Hx   . Eczema Neg Hx   . Immunodeficiency Neg Hx   . Urticaria Neg Hx     Allergies  Allergen Reactions  . Amoxicillin     rash  . Ace Inhibitors     REACTION: lip swelling    Current Outpatient Medications on File Prior to  Visit  Medication Sig Dispense Refill  . cloNIDine (CATAPRES) 0.1 MG tablet TAKE 1 TABLET TWICE A DAY 60 tablet 5  . fluticasone (FLONASE) 50 MCG/ACT nasal spray Place 2 sprays into both nostrils daily as needed for allergies or rhinitis. 18.2 g 5  . levocetirizine (XYZAL) 5 MG tablet Take 1 tablet (5 mg total) by mouth every evening. 30 tablet 5  . metoprolol succinate (TOPROL XL) 100 MG 24 hr tablet Take 1.5 tablets by mouth once daily. 135 tablet 1  . Multiple Vitamin (MULTIVITAMIN) tablet Take 1 tablet by mouth daily.      Marland Kitchen olopatadine (PATANOL) 0.1 % ophthalmic solution Place 1 drop into both eyes 2 (two) times daily as needed for allergies. 5 mL 5  . potassium chloride SA (K-DUR,KLOR-CON) 20 MEQ tablet Take 2 tablets (40 mEq total) by mouth 3 (three) times daily. (Patient taking differently: Take 40 mEq by mouth 4 (four) times daily. ) 180 tablet 3  . spironolactone (ALDACTONE) 50 MG tablet TAKE 1 TABLET DAILY 30 tablet 0  . Vitamin D, Ergocalciferol, (DRISDOL) 1.25 MG (50000 UT) CAPS capsule Take 1 capsule (50,000 Units total) by mouth every 7 (seven) days. 12 capsule 0   No current facility-administered medications on file prior to visit.     BP 130/82 (BP Location: Right Arm, Patient Position: Sitting, Cuff Size: Small)   Pulse 72   Temp 98.3 F (36.8 C) (Oral)   Resp 16   Wt 183 lb (83 kg)   SpO2 100%   BMI 31.41 kg/m       Objective:   Physical Exam Constitutional:      Appearance: She is well-developed.  Neck:     Musculoskeletal: Neck supple.     Thyroid: No thyromegaly.  Cardiovascular:     Rate and Rhythm: Normal rate and regular rhythm.     Heart sounds: Normal heart sounds. No murmur.  Pulmonary:     Effort: Pulmonary effort is normal. No respiratory distress.     Breath sounds: Normal breath sounds. No wheezing.  Skin:    General: Skin is warm and dry.  Neurological:     Mental Status: She is alert and oriented to person, place, and time.  Psychiatric:  Behavior: Behavior normal.        Thought Content: Thought content normal.        Judgment: Judgment normal.   GYN: normal external vulva, bimanual exam is normal without adnexal fullness, uterus surgically absent         Assessment & Plan:  HTN- bp is fair, continue current meds.  Hypokalemia- obtain follow up bmet to assess potassium. Continue kdur.  Fatigue- obtain cbc and tsh.    Reassurance provided that vaginal exam is normal and that vaginal tissues and tone can change over time with age and hormonal changes.

## 2018-03-30 NOTE — Telephone Encounter (Signed)
Lm for patient to be aware she needs to come back for labs.

## 2018-03-31 ENCOUNTER — Other Ambulatory Visit: Payer: Self-pay

## 2018-03-31 ENCOUNTER — Encounter: Payer: Self-pay | Admitting: Family

## 2018-03-31 ENCOUNTER — Telehealth: Payer: Self-pay | Admitting: *Deleted

## 2018-03-31 DIAGNOSIS — Z0184 Encounter for antibody response examination: Secondary | ICD-10-CM

## 2018-03-31 DIAGNOSIS — E876 Hypokalemia: Secondary | ICD-10-CM

## 2018-03-31 DIAGNOSIS — Z111 Encounter for screening for respiratory tuberculosis: Secondary | ICD-10-CM

## 2018-03-31 LAB — CBC WITH DIFFERENTIAL/PLATELET
Basophils Absolute: 0.1 10*3/uL (ref 0.0–0.1)
Basophils Relative: 1.1 % (ref 0.0–3.0)
Eosinophils Absolute: 0 10*3/uL (ref 0.0–0.7)
Eosinophils Relative: 0.6 % (ref 0.0–5.0)
HCT: 33.7 % — ABNORMAL LOW (ref 36.0–46.0)
Hemoglobin: 11.5 g/dL — ABNORMAL LOW (ref 12.0–15.0)
Lymphocytes Relative: 47.6 % — ABNORMAL HIGH (ref 12.0–46.0)
Lymphs Abs: 3.6 10*3/uL (ref 0.7–4.0)
MCHC: 34.1 g/dL (ref 30.0–36.0)
MCV: 91.4 fl (ref 78.0–100.0)
Monocytes Absolute: 0.5 10*3/uL (ref 0.1–1.0)
Monocytes Relative: 6.5 % (ref 3.0–12.0)
Neutro Abs: 3.3 10*3/uL (ref 1.4–7.7)
Neutrophils Relative %: 44.2 % (ref 43.0–77.0)
Platelets: 327 10*3/uL (ref 150.0–400.0)
RBC: 3.69 Mil/uL — ABNORMAL LOW (ref 3.87–5.11)
RDW: 12.5 % (ref 11.5–15.5)
WBC: 7.5 10*3/uL (ref 4.0–10.5)

## 2018-03-31 LAB — BASIC METABOLIC PANEL
BUN: 14 mg/dL (ref 6–23)
CO2: 26 mEq/L (ref 19–32)
Calcium: 8.4 mg/dL (ref 8.4–10.5)
Chloride: 103 mEq/L (ref 96–112)
Creatinine, Ser: 0.77 mg/dL (ref 0.40–1.20)
GFR: 99.92 mL/min (ref >60.00)
Glucose, Bld: 81 mg/dL (ref 70–99)
Potassium: 2.8 mEq/L — CL (ref 3.5–5.1)
Sodium: 138 mEq/L (ref 135–145)

## 2018-03-31 LAB — TSH: TSH: 0.52 u[IU]/mL (ref 0.35–4.50)

## 2018-03-31 NOTE — Telephone Encounter (Signed)
Patient came in for labs. 

## 2018-03-31 NOTE — Telephone Encounter (Signed)
Spoke to patient. Advised her to increase kdur to QID. She will return to the lab on Friday AM at 7:45. Can you please place her on lab schedule?   Also, she was advised to go to ER if heart palpitations/weakness. Verbalizes understanding.

## 2018-03-31 NOTE — Telephone Encounter (Signed)
CRITICAL VALUE STICKER  CRITICAL VALUE:  Potassium  2.8  RECEIVER (on-site recipient of call): Kelle Darting, Short NOTIFIED: 03/31/18 @12 :29pm  MESSENGER (representative from lab):  Roger Kill  MD NOTIFIED:  Lenna Sciara / CMA  TIME OF NOTIFICATION: 12:29pm  RESPONSE:

## 2018-03-31 NOTE — Telephone Encounter (Signed)
Patient put on the lab schedule for Friday am

## 2018-04-02 ENCOUNTER — Other Ambulatory Visit (INDEPENDENT_AMBULATORY_CARE_PROVIDER_SITE_OTHER): Payer: BLUE CROSS/BLUE SHIELD

## 2018-04-02 ENCOUNTER — Emergency Department (HOSPITAL_BASED_OUTPATIENT_CLINIC_OR_DEPARTMENT_OTHER)
Admission: EM | Admit: 2018-04-02 | Discharge: 2018-04-02 | Disposition: A | Discharge status: Home / Self Care | Payer: BLUE CROSS/BLUE SHIELD | Attending: Emergency Medicine | Admitting: Emergency Medicine

## 2018-04-02 ENCOUNTER — Other Ambulatory Visit: Payer: Self-pay

## 2018-04-02 ENCOUNTER — Encounter (HOSPITAL_BASED_OUTPATIENT_CLINIC_OR_DEPARTMENT_OTHER): Payer: Self-pay | Admitting: *Deleted

## 2018-04-02 ENCOUNTER — Telehealth: Payer: Self-pay | Admitting: Emergency Medicine

## 2018-04-02 DIAGNOSIS — I10 Essential (primary) hypertension: Secondary | ICD-10-CM | POA: Insufficient documentation

## 2018-04-02 DIAGNOSIS — Z0184 Encounter for antibody response examination: Secondary | ICD-10-CM | POA: Diagnosis not present

## 2018-04-02 DIAGNOSIS — E876 Hypokalemia: Secondary | ICD-10-CM

## 2018-04-02 DIAGNOSIS — Z79899 Other long term (current) drug therapy: Secondary | ICD-10-CM | POA: Insufficient documentation

## 2018-04-02 DIAGNOSIS — R5383 Other fatigue: Secondary | ICD-10-CM | POA: Diagnosis not present

## 2018-04-02 DIAGNOSIS — R002 Palpitations: Secondary | ICD-10-CM | POA: Diagnosis not present

## 2018-04-02 DIAGNOSIS — Z111 Encounter for screening for respiratory tuberculosis: Secondary | ICD-10-CM | POA: Diagnosis not present

## 2018-04-02 LAB — BASIC METABOLIC PANEL
Anion gap: 8 (ref 5–15)
BUN: 20 mg/dL (ref 6–23)
BUN: 18 mg/dL (ref 6–20)
CO2: 27 mEq/L (ref 19–32)
CO2: 27 mmol/L (ref 22–32)
Calcium: 10.3 mg/dL (ref 8.4–10.5)
Calcium: 9 mg/dL (ref 8.9–10.3)
Chloride: 96 mEq/L (ref 96–112)
Chloride: 104 mmol/L (ref 98–111)
Creatinine, Ser: 1.09 mg/dL (ref 0.40–1.20)
Creatinine, Ser: 0.9 mg/dL (ref 0.44–1.00)
GFR calc Af Amer: >60 mL/min (ref >60)
GFR calc non Af Amer: >60 mL/min (ref >60)
GFR: 66.91 mL/min (ref >60.00)
Glucose, Bld: 97 mg/dL (ref 70–99)
Glucose, Bld: 78 mg/dL (ref 70–99)
Potassium: 2.7 mEq/L — CL (ref 3.5–5.1)
Potassium: 2.6 mmol/L — CL (ref 3.5–5.1)
Sodium: 137 mEq/L (ref 135–145)
Sodium: 139 mmol/L (ref 135–145)

## 2018-04-02 LAB — MAGNESIUM: Magnesium: 2.2 mg/dL (ref 1.7–2.4)

## 2018-04-02 MED ORDER — POTASSIUM CHLORIDE 10 MEQ/100ML IV SOLN
10.0000 meq | Freq: Once | INTRAVENOUS | Status: AC
  Administered 2018-04-02: 10 meq via INTRAVENOUS
  Filled 2018-04-02: qty 100

## 2018-04-02 MED ORDER — SODIUM CHLORIDE 0.9 % IV SOLN
Freq: Once | INTRAVENOUS | Status: AC
  Administered 2018-04-02: 18:00:00 via INTRAVENOUS

## 2018-04-02 MED ORDER — POTASSIUM CHLORIDE CRYS ER 20 MEQ PO TBCR
20.0000 meq | EXTENDED_RELEASE_TABLET | Freq: Once | ORAL | Status: AC
  Administered 2018-04-02: 20 meq via ORAL
  Filled 2018-04-02: qty 1

## 2018-04-02 MED ORDER — POTASSIUM CHLORIDE CRYS ER 20 MEQ PO TBCR
40.0000 meq | EXTENDED_RELEASE_TABLET | Freq: Once | ORAL | Status: AC
  Administered 2018-04-02: 40 meq via ORAL
  Filled 2018-04-02: qty 2

## 2018-04-02 MED ORDER — POTASSIUM CHLORIDE 10 MEQ/100ML IV SOLN
10.0000 meq | INTRAVENOUS | Status: AC
  Administered 2018-04-02 (×2): 10 meq via INTRAVENOUS
  Filled 2018-04-02 (×2): qty 100

## 2018-04-02 NOTE — ED Triage Notes (Signed)
C/o being tired and fatigue x 1 week  Had labs drawn this am and was called and told to go to ed for kcl 2.7

## 2018-04-02 NOTE — Telephone Encounter (Signed)
"  CRITICAL VALUE STICKER  CRITICAL VALUE:Potassium 2.7  RECEIVER (on-site recipient of call):Kristy Price DATE & TIME NOTIFIED: 04-02-18 1149  MESSENGER (representative from lab):Hope  MD NOTIFIED: Edwena Blow  TIME OF NOTIFICATION:1150  RESPONSE: Gave Critical note to [facility], she stated will give to [facility]

## 2018-04-02 NOTE — Discharge Instructions (Signed)
You were seen in the ER today for low potassium, otherwise known as hypokalemia. Your potassium in the ER was 2.6- you were given IV and oral potassium. Please continue your oral potassium 40 mEq 4 times per day. Please also follow the attached diet guidelines for food that helps elevated your potassium. Follow up with primary care on Monday (03/16) to ensure this is rechecked. Return to the ER for new or worsening symptoms including but limited to worsened fatigue, increase palpitations, chest pain, shortness of breath, numbness, weakness, muscle cramps, vomiting, diarrhea, fever, passing out, or any other concerns.

## 2018-04-02 NOTE — Telephone Encounter (Signed)
Pt called back. Best # [phone]. Will be waiting for call.

## 2018-04-02 NOTE — Telephone Encounter (Signed)
No answer on cell. Left message on her work voice mail to call me back this afternoon.

## 2018-04-02 NOTE — ED Notes (Signed)
ED Provider at bedside. 

## 2018-04-02 NOTE — Telephone Encounter (Signed)
Information release request form faxed to [facility]

## 2018-04-02 NOTE — ED Provider Notes (Signed)
[facility] Provider Note   CSN: [id number] Arrival date & time: 04/02/18  1614    History   Chief Complaint Chief Complaint  Patient presents with  . Fatigue    HPI Nicole Quinn is a 43 y.o. female with a hx of anemia, HTN, and chronic hypokalemia who presents to the emergency department at request of her primary care provider for hypokalemia.  Patient states that she was being seen for a general follow-up appointment by her primary care provider 03/30/18-had general labs done which revealed hypokalemia at 2.8.  States that prior to this she believed that she was suppose to be taking 40 mEq of K-Dur three times daily, per PCP she is to be taking this four times daily.  Patient states in all actuality she had really only been taking 40 mEq twice daily. Plan by PCP was increase to QID w/ repeat labs this AM. Potassium was 2.7 this AM, PCP called patient, and recommended ER evaluation. Patient states she feels a bit fatigued and intermittent palpitations that less < 1 minute. No alleviating/aggravating factors. Sxs consistent with prior hypokalemia requiring IV replacement. No prior admissions. This is a chronic problem of unknown etiology, she is on oral supplement & aldactone, she had seen nephrology within past year and she informs me no significant change made. Denies fever, chills, dyspnea, chest pain, emesis, diarrhea, urinary frequency, or dysuria.    I personally received consultation from patient's PCP NP Debbrah Alar prior to patient arrival- informed me of patient's chronic hypokalemia, typically can maintain 3.3-3.5 w/ oral supplementation per her report, unclear etiology, has seen nephrology, sending for replacement and EKG.   HPI  Past Medical History:  Diagnosis Date  . Allergy   . Anemia   . Endometriosis   . History of echocardiogram    Echo 6/18: EF 60-65, no RWMA, normal diastolic function  . History of nuclear stress test    Myoview 6/18: EF 60, normal perfusion, Low Risk  . History of UTI   . Hypertension    pregnancy induced HTN/pre-eclampsia //  on meds since 2nd child (2000)    Patient Active Problem List   Diagnosis Date Noted  . Dysuria 02/13/2018  . Arthralgia 02/13/2018  . Angioedema 06/26/2016  . Seasonal and perennial allergic rhinitis 06/26/2016  . Allergic conjunctivitis 06/26/2016  . Food allergy 06/26/2016  . Allergic urticaria 06/26/2016  . Depression 01/29/2016  . Left wrist pain 11/29/2015  . Obesity (BMI 30.0-34.9) 08/12/2013  . Other and unspecified hyperlipidemia 02/24/2013  . Chronic constipation 02/22/2013  . Routine general medical examination at a health care facility 02/22/2013  . Anxiety associated with depression 10/10/2011  . Dyspnea 08/24/2011  . OTHER ACNE 06/04/2009  . HYPERGLYCEMIA, BORDERLINE 06/04/2009  . Essential hypertension 04/10/2009    Past Surgical History:  Procedure Laterality Date  . ABDOMINAL HYSTERECTOMY  2003   partial  . CARPAL TUNNEL RELEASE     right  . GANGLION CYST EXCISION     right  . Hammer Toe Repair Bilateral 12/29/2013   Lt #3, #4, Rt #2.     OB History   No obstetric history on file.      Home Medications    Prior to Admission medications   Medication Sig Start Date End Date Taking? Authorizing Provider  cloNIDine (CATAPRES) 0.1 MG tablet TAKE 1 TABLET TWICE A DAY 12/26/16   Debbrah Alar, NP  fluticasone (FLONASE) 50 MCG/ACT nasal spray Place 2 sprays into both nostrils daily  as needed for allergies or rhinitis. 06/26/16   Bobbitt, [place] Muta, MD  levocetirizine (XYZAL) 5 MG tablet Take 1 tablet (5 mg total) by mouth every evening. 06/26/16   Bobbitt, [place] Muta, MD  metoprolol succinate (TOPROL XL) 100 MG 24 hr tablet Take 1.5 tablets by mouth once daily. 04/21/17   Debbrah Alar, NP  Multiple Vitamin (MULTIVITAMIN) tablet Take 1 tablet by mouth daily.      [provider]  olopatadine (PATANOL) 0.1 %  ophthalmic solution Place 1 drop into both eyes 2 (two) times daily as needed for allergies. 06/26/16   Bobbitt, [place] Muta, MD  potassium chloride SA (K-DUR,KLOR-CON) 20 MEQ tablet Take 2 tablets (40 mEq total) by mouth 3 (three) times daily. Patient taking differently: Take 40 mEq by mouth 4 (four) times daily.  04/24/17   Debbrah Alar, NP  spironolactone (ALDACTONE) 50 MG tablet TAKE 1 TABLET DAILY 03/22/18   Debbrah Alar, NP  Vitamin D, Ergocalciferol, (DRISDOL) 1.25 MG (50000 UT) CAPS capsule Take 1 capsule (50,000 Units total) by mouth every 7 (seven) days. 02/17/18   Ann Held, DO    Family History Family History  Problem Relation Age of Onset  . Allergies Daughter   . Allergic rhinitis Daughter   . Allergies Son   . Allergic rhinitis Son   . Leukemia Paternal Grandmother   . Arthritis Mother   . Hyperlipidemia Mother   . Hypertension Mother   . Stroke Mother   . Allergic rhinitis Mother   . Hypertension Father   . Diabetes Father   . Arthritis Maternal Grandmother   . Stroke Maternal Grandmother 50  . Hypertension Maternal Grandmother   . Heart attack Maternal Grandmother 50  . Coronary artery disease Maternal Aunt 50  . Coronary artery disease Maternal Aunt 50  . Angioedema Neg Hx   . Asthma Neg Hx   . Eczema Neg Hx   . Immunodeficiency Neg Hx   . Urticaria Neg Hx     Social History Social History   Tobacco Use  . Smoking status: Never Smoker  . Smokeless tobacco: Never Used  Substance Use Topics  . Alcohol use: No    Alcohol/week: 0.0 standard drinks  . Drug use: No     Allergies   Amoxicillin and Ace inhibitors   Review of Systems Review of Systems  Constitutional: Positive for fatigue. Negative for chills and fever.  Respiratory: Negative for shortness of breath.   Cardiovascular: Positive for palpitations. Negative for chest pain.  Gastrointestinal: Negative for abdominal pain, diarrhea, nausea and vomiting.  Musculoskeletal:  Negative for myalgias.  Neurological: Negative for dizziness, tremors, syncope, weakness, light-headedness and numbness.  All other systems reviewed and are negative.    Physical Exam Updated Vital Signs BP 111/81   Pulse 64   Temp 98 F (36.7 C) (Oral)   Resp 14   Ht 5\' 4"  (1.626 m)   Wt 77.1 kg   SpO2 100%   BMI 29.18 kg/m   Physical Exam Vitals signs and nursing note reviewed.  Constitutional:      General: She is not in acute distress.    Appearance: She is well-developed. She is not toxic-appearing.  HENT:     Head: Normocephalic and atraumatic.  Eyes:     General:        Right eye: No discharge.        Left eye: No discharge.     Conjunctiva/sclera: Conjunctivae normal.  Neck:  Musculoskeletal: Neck supple.  Cardiovascular:     Rate and Rhythm: Normal rate and regular rhythm.     Pulses: Normal pulses.     Heart sounds: No murmur. No friction rub. No gallop.   Pulmonary:     Effort: Pulmonary effort is normal. No respiratory distress.     Breath sounds: Normal breath sounds. No wheezing, rhonchi or rales.  Abdominal:     General: There is no distension.     Palpations: Abdomen is soft.     Tenderness: There is no abdominal tenderness.  Skin:    General: Skin is warm and dry.     Findings: No rash.  Neurological:     Mental Status: She is alert.     Comments: Clear speech.   Psychiatric:        Behavior: Behavior normal.    ED Treatments / Results  Labs (all labs ordered are listed, but only abnormal results are displayed) Labs Reviewed  BASIC METABOLIC PANEL - Abnormal; Notable for the following components:      Result Value   Potassium 2.6 (*)    All other components within normal limits  MAGNESIUM    EKG EKG Interpretation  Date/Time:  Friday April 02 2018 16:25:16 EDT Ventricular Rate:  73 PR Interval:  176 QRS Duration: 90 QT Interval:  386 QTC Calculation: 425 R Axis:   54 Text Interpretation:  Normal sinus rhythm Possible Left  atrial enlargement Nonspecific T wave abnormality Abnormal ECG since last tracing no significant change Confirmed by Malvin Johns [id number]) on 04/02/2018 5:44:39 PM   Radiology No results found.  Procedures Procedures (including critical care time)  Medications Ordered in ED Medications  potassium chloride 10 mEq in 100 mL IVPB (10 mEq Intravenous New Bag/Given 04/02/18 1829)  potassium chloride SA (K-DUR,KLOR-CON) CR tablet 40 mEq (40 mEq Oral Given 04/02/18 1824)  0.9 %  sodium chloride infusion ( Intravenous New Bag/Given 04/02/18 1827)     Initial Impression / Assessment and Plan / ED Course  I have reviewed the triage vital signs and the nursing notes.  Pertinent labs & imaging results that were available during my care of the patient were reviewed by me and considered in my medical decision making (see chart for details).   Patient with history of chronic hypokalemia of unknown etiology presents to the emergency department at request of PCP for EKG and potassium supplementation due to hypokalemia that was not improved with increased PO supplement.  Labs redrawn in the ER: Potassium low @ 2.6, BMP otherwise WNL, Mg WNL. EKG without QTc prolongation, no significant change from prior tracing. Given this is a chronic issues for patient, do not see indication for admission currently, plan for oral & IV replacement in the ER with discharge home with close PCP follow up. Continue 40 mEq QID, information for potassium food contents to be provided. I discussed results, treatment plan, need for follow-up, and return precautions with the patient. Provided opportunity for questions, patient confirmed understanding and is in agreement with plan.   Findings and plan of care discussed with supervising physician Dr. Tamera Punt who is in agreement.    Final Clinical Impressions(s) / ED Diagnoses   Final diagnoses:  Hypokalemia    ED Discharge Orders    None       Amaryllis Dyke, PA-C  04/03/18 1944    Malvin Johns, MD 04/03/18 2320

## 2018-04-02 NOTE — ED Notes (Signed)
Pt on cardiac monitor and auto VS 

## 2018-04-02 NOTE — Telephone Encounter (Signed)
I spoke to patient.  Confirmed that she took 40 mEq 4 times daily yesterday of K. Dur.  Follow-up potassium has dropped from 2.8-2.7.  When I asked her if she is having some palpitations she said yes she has been having some palpitations today.  I have advised her to proceed to the emergency department at the med center [facility].  Report was given to ER provider "Sammy."  Patient told me that she did see [facility] last year.  I do not see that we received a consultation note back from them.  Will request copy of her consult.  Ruth-can you please call [facility] and request a copy of her consultation?  Thank you.

## 2018-04-02 NOTE — ED Notes (Signed)
Pt and family understood dc material. NAD noted. All questions answered to satisfaction. Pt and family escorted to check out counter.

## 2018-04-05 ENCOUNTER — Ambulatory Visit (INDEPENDENT_AMBULATORY_CARE_PROVIDER_SITE_OTHER): Payer: BLUE CROSS/BLUE SHIELD | Admitting: Family

## 2018-04-05 ENCOUNTER — Other Ambulatory Visit: Payer: Self-pay

## 2018-04-05 ENCOUNTER — Encounter: Payer: Self-pay | Admitting: Family

## 2018-04-05 VITALS — BP 124/91 | HR 79 | Temp 98.0°F | Resp 16 | Ht 64.0 in | Wt 176.0 lb

## 2018-04-05 DIAGNOSIS — E876 Hypokalemia: Secondary | ICD-10-CM | POA: Diagnosis not present

## 2018-04-05 LAB — BASIC METABOLIC PANEL
BUN: 15 mg/dL (ref 6–23)
CO2: 29 mEq/L (ref 19–32)
Calcium: 9.9 mg/dL (ref 8.4–10.5)
Chloride: 98 mEq/L (ref 96–112)
Creatinine, Ser: 0.84 mg/dL (ref 0.40–1.20)
GFR: 90.37 mL/min (ref >60.00)
Glucose, Bld: 87 mg/dL (ref 70–99)
Potassium: 3.7 mEq/L (ref 3.5–5.1)
Sodium: 137 mEq/L (ref 135–145)

## 2018-04-05 LAB — MEASLES/MUMPS/RUBELLA IMMUNITY
Mumps IgG: 52.7 AU/mL
Rubella: 2.72 index
Rubeola IgG: 149 AU/mL

## 2018-04-05 LAB — QUANTIFERON-TB GOLD PLUS
Mitogen-NIL: 8.01 IU/mL
NIL: 0.02 IU/mL
QuantiFERON-TB Gold Plus: NEGATIVE
TB1-NIL: 0 IU/mL
TB2-NIL: 0 IU/mL

## 2018-04-05 LAB — VARICELLA ZOSTER ANTIBODY, IGG: Varicella IgG: 993.6 index

## 2018-04-05 LAB — MAGNESIUM: Magnesium: 2 mg/dL (ref 1.5–2.5)

## 2018-04-05 NOTE — Progress Notes (Signed)
Subjective:    Patient ID: [name], female    DOB: September 17, 1975, 43 y.o.   MRN: [id number]  HPI  Patient is a healthy female who presents today for ER follow-up.  We contacted the patient on Friday following potassium repletion for a potassium of 2.8.  Follow-up potassium was 2.7 and she reported intermittent palpitations.  She presented to the ED where they gave additional IV and PO potassium supplementation.  A follow-up post supplementation level was not obtained.  She reports no palpitations since she left the ED. She reports that she has been faithfully taking 40 mEq 4 times daily of her potassium since she left the emergency department.  Review of Systems See HPI  Past Medical History:  Diagnosis Date  . Allergy   . Anemia   . Endometriosis   . History of echocardiogram    Echo 6/18: EF 60-65, no RWMA, normal diastolic function  . History of nuclear stress test    Myoview 6/18: EF 60, normal perfusion, Low Risk  . History of UTI   . Hypertension    pregnancy induced HTN/pre-eclampsia //  on meds since 2nd child (2000)     Social History   Socioeconomic History  . Marital status: Married    Spouse name: Not on file  . Number of children: 2  . Years of education: Not on file  . Highest education level: Not on file  Occupational History    Employer: [employer]  . Financial resource strain: Not on file  . Food insecurity:    Worry: Not on file    Inability: Not on file  . Transportation needs:    Medical: Not on file    Non-medical: Not on file  Tobacco Use  . Smoking status: Never Smoker  . Smokeless tobacco: Never Used  Substance and Sexual Activity  . Alcohol use: No    Alcohol/week: 0.0 standard drinks  . Drug use: No  . Sexual activity: Not on file  Lifestyle  . Physical activity:    Days per week: Not on file    Minutes per session: Not on file  . Stress: Not on  file  Relationships  . Social connections:    Talks on phone: Not on file    Gets together: Not on file    Attends religious service: Not on file    Active member of club or organization: Not on file    Attends meetings of clubs or organizations: Not on file    Relationship status: Not on file  . Intimate partner violence:    Fear of current or ex partner: Not on file    Emotionally abused: Not on file    Physically abused: Not on file    Forced sexual activity: Not on file  Other Topics Concern  . Not on file  Social History Narrative   Regular exercise: yes.   Married.  Lives with one child.  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         Past Surgical History:  Procedure Laterality Date  . ABDOMINAL HYSTERECTOMY  2003   partial  . CARPAL TUNNEL RELEASE     right  . GANGLION CYST EXCISION     right  . Hammer Toe Repair Bilateral 12/29/2013   Lt #3, #4, Rt #2.    Family History  Problem Relation Age of Onset  . Allergies Daughter   .  Allergic rhinitis Daughter   . Allergies Son   . Allergic rhinitis Son   . Leukemia Paternal Grandmother   . Arthritis Mother   . Hyperlipidemia Mother   . Hypertension Mother   . Stroke Mother   . Allergic rhinitis Mother   . Hypertension Father   . Diabetes Father   . Arthritis Maternal Grandmother   . Stroke Maternal Grandmother 50  . Hypertension Maternal Grandmother   . Heart attack Maternal Grandmother 50  . Coronary artery disease Maternal Aunt 50  . Coronary artery disease Maternal Aunt 50  . Angioedema Neg Hx   . Asthma Neg Hx   . Eczema Neg Hx   . Immunodeficiency Neg Hx   . Urticaria Neg Hx     Allergies  Allergen Reactions  . Amoxicillin     rash  . Ace Inhibitors     REACTION: lip swelling    Current Outpatient Medications on File Prior to Visit  Medication Sig Dispense Refill  . cloNIDine (CATAPRES) 0.1 MG tablet TAKE 1 TABLET TWICE A DAY 60 tablet 5  . fluticasone (FLONASE) 50 MCG/ACT nasal spray  Place 2 sprays into both nostrils daily as needed for allergies or rhinitis. 18.2 g 5  . levocetirizine (XYZAL) 5 MG tablet Take 1 tablet (5 mg total) by mouth every evening. 30 tablet 5  . metoprolol succinate (TOPROL XL) 100 MG 24 hr tablet Take 1.5 tablets by mouth once daily. 135 tablet 1  . Multiple Vitamin (MULTIVITAMIN) tablet Take 1 tablet by mouth daily.      Marland Kitchen olopatadine (PATANOL) 0.1 % ophthalmic solution Place 1 drop into both eyes 2 (two) times daily as needed for allergies. 5 mL 5  . potassium chloride SA (K-DUR,KLOR-CON) 20 MEQ tablet Take 2 tablets (40 mEq total) by mouth 3 (three) times daily. (Patient taking differently: Take 40 mEq by mouth 4 (four) times daily. ) 180 tablet 3  . spironolactone (ALDACTONE) 50 MG tablet TAKE 1 TABLET DAILY 30 tablet 0  . Vitamin D, Ergocalciferol, (DRISDOL) 1.25 MG (50000 UT) CAPS capsule Take 1 capsule (50,000 Units total) by mouth every 7 (seven) days. 12 capsule 0   No current facility-administered medications on file prior to visit.     BP (!) 124/91 (BP Location: Right Arm, Patient Position: Sitting, Cuff Size: Normal)   Pulse 79   Temp 98 F (36.7 C) (Oral)   Resp 16   Ht 5\' 4"  (1.626 m)   Wt 176 lb (79.8 kg)   SpO2 100%   BMI 30.21 kg/m       Objective:   Physical Exam Constitutional:      Appearance: She is well-developed.  Neck:     Musculoskeletal: Neck supple.     Thyroid: No thyromegaly.  Cardiovascular:     Rate and Rhythm: Normal rate and regular rhythm.     Heart sounds: Normal heart sounds. No murmur.  Pulmonary:     Effort: Pulmonary effort is normal. No respiratory distress.     Breath sounds: Normal breath sounds. No wheezing.  Skin:    General: Skin is warm and dry.  Neurological:     Mental Status: She is alert and oriented to person, place, and time.  Psychiatric:        Behavior: Behavior normal.        Thought Content: Thought content normal.        Judgment: Judgment normal.            Assessment &  Plan:  Hypokalemia- ER record is reviewed.  She is clinically improved.  Will obtain follow-up basic metabolic as well as a magnesium level.  Continue potassium supplementation.  Hypertension- blood pressures is fair today.  Diastolic blood pressures 91.  Continue Aldactone, clonidine, metoprolol.  Vit D deficiency-patient was started on vitamin D on 02/17/2018.  She will need a 12-week follow-up vitamin D level.  Continue weekly supplementation.

## 2018-04-05 NOTE — Telephone Encounter (Signed)
Records received and placed on Melissa's folder to review.

## 2018-04-07 ENCOUNTER — Ambulatory Visit (INDEPENDENT_AMBULATORY_CARE_PROVIDER_SITE_OTHER): Payer: BLUE CROSS/BLUE SHIELD

## 2018-04-07 ENCOUNTER — Other Ambulatory Visit: Payer: Self-pay

## 2018-04-07 DIAGNOSIS — Z23 Encounter for immunization: Secondary | ICD-10-CM

## 2018-04-19 DIAGNOSIS — M545 Low back pain: Secondary | ICD-10-CM | POA: Diagnosis not present

## 2018-04-19 DIAGNOSIS — M62830 Muscle spasm of back: Secondary | ICD-10-CM | POA: Diagnosis not present

## 2018-04-19 DIAGNOSIS — M9903 Segmental and somatic dysfunction of lumbar region: Secondary | ICD-10-CM | POA: Diagnosis not present

## 2018-04-19 DIAGNOSIS — M546 Pain in thoracic spine: Secondary | ICD-10-CM | POA: Diagnosis not present

## 2018-04-21 DIAGNOSIS — M546 Pain in thoracic spine: Secondary | ICD-10-CM | POA: Diagnosis not present

## 2018-04-21 DIAGNOSIS — M9903 Segmental and somatic dysfunction of lumbar region: Secondary | ICD-10-CM | POA: Diagnosis not present

## 2018-04-21 DIAGNOSIS — M62830 Muscle spasm of back: Secondary | ICD-10-CM | POA: Diagnosis not present

## 2018-04-21 DIAGNOSIS — M545 Low back pain: Secondary | ICD-10-CM | POA: Diagnosis not present

## 2018-05-07 ENCOUNTER — Ambulatory Visit (INDEPENDENT_AMBULATORY_CARE_PROVIDER_SITE_OTHER): Payer: BLUE CROSS/BLUE SHIELD

## 2018-05-07 ENCOUNTER — Other Ambulatory Visit: Payer: Self-pay

## 2018-05-07 DIAGNOSIS — Z23 Encounter for immunization: Secondary | ICD-10-CM | POA: Diagnosis not present

## 2018-05-07 NOTE — Progress Notes (Signed)
Patient here for Hep B vaccination. 1 ML Hep B vaccination given IM in left deltoid. Pt tolerated well. VIS given.

## 2018-06-02 ENCOUNTER — Encounter: Payer: Self-pay | Admitting: Family

## 2018-06-03 MED ORDER — FLUCONAZOLE 150 MG PO TABS: 150.0000 mg | ORAL_TABLET | Freq: Once | ORAL | 0 refills | Status: AC

## 2018-07-05 ENCOUNTER — Encounter: Payer: Self-pay | Admitting: Family

## 2018-07-06 NOTE — Telephone Encounter (Signed)
Can we please schedule her a virtual? I can see her at 5 PM since I already did that one.

## 2018-08-06 ENCOUNTER — Ambulatory Visit: Payer: BLUE CROSS/BLUE SHIELD

## 2018-08-19 ENCOUNTER — Other Ambulatory Visit: Payer: Self-pay | Admitting: Family

## 2018-08-19 ENCOUNTER — Encounter: Payer: Self-pay | Admitting: Family

## 2018-08-20 ENCOUNTER — Ambulatory Visit (INDEPENDENT_AMBULATORY_CARE_PROVIDER_SITE_OTHER): Payer: Self-pay

## 2018-08-20 ENCOUNTER — Other Ambulatory Visit: Payer: Self-pay

## 2018-08-20 DIAGNOSIS — Z23 Encounter for immunization: Secondary | ICD-10-CM

## 2018-08-20 MED ORDER — SPIRONOLACTONE 50 MG PO TABS: 50.0000 mg | ORAL_TABLET | Freq: Every day | ORAL | 2 refills | Status: DC

## 2018-08-20 MED ORDER — POTASSIUM CHLORIDE CRYS ER 20 MEQ PO TBCR: 40.0000 meq | EXTENDED_RELEASE_TABLET | Freq: Three times a day (TID) | ORAL | 3 refills | Status: DC

## 2018-08-20 NOTE — Progress Notes (Signed)
Pre visit review using our clinic review tool, if applicable. No additional management support is needed unless otherwise documented below in the visit note.  Patient here today for hep b immunization. 1 mL hep b given in patients left deltoid IM. VIS given. Patient tolerated well.

## 2018-10-14 ENCOUNTER — Other Ambulatory Visit: Payer: Self-pay | Admitting: Family

## 2018-10-14 DIAGNOSIS — Z1231 Encounter for screening mammogram for malignant neoplasm of breast: Secondary | ICD-10-CM

## 2018-10-19 ENCOUNTER — Encounter: Payer: Self-pay | Admitting: Family

## 2018-10-19 ENCOUNTER — Ambulatory Visit (INDEPENDENT_AMBULATORY_CARE_PROVIDER_SITE_OTHER): Payer: BC Managed Care – PPO | Admitting: Medical

## 2018-10-19 ENCOUNTER — Encounter: Payer: Self-pay | Admitting: Medical

## 2018-10-19 DIAGNOSIS — B379 Candidiasis, unspecified: Secondary | ICD-10-CM

## 2018-10-19 MED ORDER — FLUCONAZOLE 150 MG PO TABS: 150.0000 mg | ORAL_TABLET | Freq: Once | ORAL | 0 refills | Status: AC

## 2018-10-19 NOTE — Patient Instructions (Signed)
You do describe symptoms probable symptoms.  So we will go ahead and give Diflucan tablet use 1 tablet for 1 day.  This should resolve your symptoms but if not please notify us.  If symptoms persist despite use of Diflucan then would consider urine ancillary studies to evaluate other differential diagnoses.  Follow-up as regular scheduled with PCP or as needed.

## 2018-10-19 NOTE — Progress Notes (Signed)
   Subjective:    Patient ID: Nicole Quinn, female    DOB: 1975-08-11, 44 y.o.   MRN: [id number]  HPI  Virtual Visit via Telephone Note  I connected with Nicole Quinn on 10/19/18 at  4:00 PM EDT by telephone and verified that I am speaking with the correct person using two identifiers.  Location: Patient: home Provider: office  Pt did not check her vitals today.   I discussed the limitations, risks, security and privacy concerns of performing an evaluation and management service by telephone and the availability of in person appointments. I also discussed with the patient that there may be a patient responsible charge related to this service. The patient expressed understanding and agreed to proceed.   History of Present Illness: Pt states some vaginal itching and white dc since Friday. Pt not been on any antibiotics recently. Pt gets some occasional yeast infection in the past. No odor reported.    Hx of hysterectomy.   Observations/Objective: General- no acute distress, pleasant pt. Oriented. Normal speech.   Assessment and Plan: You do describe symptoms probable symptoms.  So we will go ahead and give Diflucan tablet use 1 tablet for 1 day.  This should resolve your symptoms but if not please notify us.  If symptoms persist despite use of Diflucan then would consider urine ancillary studies to evaluate other differential diagnoses.  Follow-up as regular scheduled with PCP or as needed.  Mackie Pai, PA-C  Follow Up Instructions:    I discussed the assessment and treatment plan with the patient. The patient was provided an opportunity to ask questions and all were answered. The patient agreed with the plan and demonstrated an understanding of the instructions.   The patient was advised to call back or seek an in-person evaluation if the symptoms worsen or if the condition fails to improve as anticipated.  I provided 10 minutes of non-face-to-face time during this  encounter.   Mackie Pai, PA-C    Review of Systems  Constitutional: Negative for chills, fatigue and fever.  Respiratory: Negative for choking, shortness of breath and wheezing.   Cardiovascular: Negative for chest pain and palpitations.  Genitourinary: Positive for vaginal discharge. Negative for difficulty urinating, enuresis, flank pain, frequency and urgency.       White discharge with vaginal itch.  Musculoskeletal: Negative for back pain and myalgias.       Objective:   Physical Exam        Assessment & Plan:

## 2018-10-23 ENCOUNTER — Ambulatory Visit (INDEPENDENT_AMBULATORY_CARE_PROVIDER_SITE_OTHER)
Admission: RE | Admit: 2018-10-23 | Discharge: 2018-10-23 | Disposition: A | Discharge status: Home / Self Care | Payer: BC Managed Care – PPO | Source: Ambulatory Visit

## 2018-10-23 DIAGNOSIS — H1032 Unspecified acute conjunctivitis, left eye: Secondary | ICD-10-CM | POA: Diagnosis not present

## 2018-10-23 MED ORDER — ERYTHROMYCIN 5 MG/GM OP OINT: 1.0000 "application " | TOPICAL_OINTMENT | Freq: Four times a day (QID) | OPHTHALMIC | 0 refills | Status: DC

## 2018-10-23 NOTE — ED Provider Notes (Signed)
Virtual Visit via Video Note:  [name]  initiated request for Telemedicine visit with [facility] team. I connected with [name]  on 10/23/2018 at 10:29 AM  for a synchronized telemedicine visit using a video enabled HIPPA compliant telemedicine application. I verified that I am speaking with [name]  using two identifiers. Zigmund Gottron, NP  was physically located in a [facility]gent [facility] site and Curry Gruhlke was located at a different location.   The limitations of evaluation and management by telemedicine as well as the availability of in-person appointments were discussed. Patient was informed that she  may incur a bill ( including co-pay) for this virtual visit encounter. Kalan Wilczynski  expressed understanding and gave verbal consent to proceed with virtual visit.     History of Present Illness:Nicole Quinn  is a 43 y.o. female presents with complaints of soreness to her left eye with redness. Has been using OTC eye drops. Mattering this morning. Still soreness and redness. Light sensitivity. No vision changes. Denies any previous similar. No known ill contacts. Lives alone. No other URI symptoms. No seasonal allergies. She has been using "Pink Eye relief".  Soreness to the eye lid without swelling. Wears contacts, monthly style, changes them regularly as directed and doesn't sleep in them.   Past Medical History:  Diagnosis Date  . Allergy   . Anemia   . Endometriosis   . History of echocardiogram    Echo 6/18: EF 60-65, no RWMA, normal diastolic function  . History of nuclear stress test    Myoview 6/18: EF 60, normal perfusion, Low Risk  . History of UTI   . Hypertension    pregnancy induced HTN/pre-eclampsia //  on meds since 2nd child (2000)    Allergies  Allergen Reactions  . Amoxicillin     rash  . Ace Inhibitors     REACTION: lip swelling        Observations/Objective: Alert, oriented, non toxic in appearance. Clear coherent speech  without difficulty. No increased work of breathing visualized.  Left conjunctiva does appear red, no obvious visible drainage over video; no visible redness or swelling to lid; opening eye without difficulty and EOM appear intact   Assessment and Plan: Contact user with unilateral red and mattering eye. Discussed viral vs bacterial and opted to provided antibiotics. Encouraged to discard current contacts, discontinue use until treatment complete. Follow up with eye doctor for physical exam next week. Return precautions provided. Patient verbalized understanding and agreeable to plan.    Follow Up Instructions:    I discussed the assessment and treatment plan with the patient. The patient was provided an opportunity to ask questions and all were answered. The patient agreed with the plan and demonstrated an understanding of the instructions.   The patient was advised to call back or seek an in-person evaluation if the symptoms worsen or if the condition fails to improve as anticipated.  I provided 15 minutes of non-face-to-face time during this encounter.    Zigmund Gottron, NP  10/23/2018 10:29 AM         Zigmund Gottron, NP 10/23/18 1030

## 2018-10-23 NOTE — Discharge Instructions (Signed)
Discontinue use of your contacts and throw out your existing pair. May restart once medication course has been completed and symptoms have resolved.  May start the antibiotic ointment today.  If you haven't had any improvement or if worsening at all by Monday please see your eye doctor for further exam of your eye.

## 2018-10-24 ENCOUNTER — Other Ambulatory Visit: Payer: Self-pay

## 2018-10-24 ENCOUNTER — Telehealth: Payer: Self-pay | Admitting: Physician Assistant

## 2018-10-24 ENCOUNTER — Telehealth: Payer: BC Managed Care – PPO

## 2018-10-24 ENCOUNTER — Encounter: Payer: Self-pay | Admitting: Emergency Medicine

## 2018-10-24 ENCOUNTER — Encounter: Payer: Self-pay | Admitting: Physician Assistant

## 2018-10-24 ENCOUNTER — Ambulatory Visit
Admission: EM | Admit: 2018-10-24 | Discharge: 2018-10-24 | Disposition: A | Discharge status: Home / Self Care | Payer: BC Managed Care – PPO | Attending: Physician Assistant | Admitting: Physician Assistant

## 2018-10-24 DIAGNOSIS — H16002 Unspecified corneal ulcer, left eye: Secondary | ICD-10-CM

## 2018-10-24 MED ORDER — CIPROFLOXACIN HCL 0.3 % OP SOLN: 1.0000 [drp] | Freq: Four times a day (QID) | OPHTHALMIC | 0 refills | Status: DC

## 2018-10-24 MED ORDER — CIPROFLOXACIN HCL 0.3 % OP OINT: TOPICAL_OINTMENT | Freq: Four times a day (QID) | OPHTHALMIC | 0 refills | Status: DC

## 2018-10-24 NOTE — ED Notes (Signed)
Patient able to ambulate independently  

## 2018-10-24 NOTE — ED Triage Notes (Addendum)
Pt presents to [facility] for assessment of 2 days of eye redness, pain and swelling, and morning discharge.  Pt c/o nasal congestion starting today.

## 2018-10-24 NOTE — Telephone Encounter (Signed)
Called in ointment in error. Switched to ophthalmic drop.

## 2018-10-24 NOTE — ED Provider Notes (Signed)
[facility]    CSN: [id number] Arrival date & time: 10/24/18  [id number]      History   Chief Complaint Chief Complaint  Patient presents with  . Conjunctivitis    HPI Nicole Quinn is a 43 y.o. female.   43 year old female comes in for 2-day history of left eye pain, redness, photophobia.  Patient had a virtual visit yesterday, and was started on erythromycin ointment.  She had significant worsening of symptoms last night, noticed a white spot to the eye this morning, and came in for evaluation.  She continues to have significant photophobia, though without vision changes.  Has significant eye watering, and wakes up in the morning with crusting.  She is a contact lens user, changes monthly, recent pair within the month.  She denies sleeping overnight and contacts.  Denies increase in eyelid swelling, painful eye movement.  Denied URI symptoms yesterday.  Did mention that given increased eye watering this morning, has now had some nasal congestion.  Patient has not worn contacts since symptom onset.     Past Medical History:  Diagnosis Date  . Allergy   . Anemia   . Endometriosis   . History of echocardiogram    Echo 6/18: EF 60-65, no RWMA, normal diastolic function  . History of nuclear stress test    Myoview 6/18: EF 60, normal perfusion, Low Risk  . History of UTI   . Hypertension    pregnancy induced HTN/pre-eclampsia //  on meds since 2nd child (2000)    Patient Active Problem List   Diagnosis Date Noted  . Dysuria 02/13/2018  . Arthralgia 02/13/2018  . Angioedema 06/26/2016  . Seasonal and perennial allergic rhinitis 06/26/2016  . Allergic conjunctivitis 06/26/2016  . Food allergy 06/26/2016  . Allergic urticaria 06/26/2016  . Depression 01/29/2016  . Left wrist pain 11/29/2015  . Obesity (BMI 30.0-34.9) 08/12/2013  . Other and unspecified hyperlipidemia 02/24/2013  . Chronic constipation 02/22/2013  . Routine general medical examination at a health  care facility 02/22/2013  . Anxiety associated with depression 10/10/2011  . Dyspnea 08/24/2011  . OTHER ACNE 06/04/2009  . HYPERGLYCEMIA, BORDERLINE 06/04/2009  . Essential hypertension 04/10/2009    Past Surgical History:  Procedure Laterality Date  . ABDOMINAL HYSTERECTOMY  2003   partial  . CARPAL TUNNEL RELEASE     right  . GANGLION CYST EXCISION     right  . Hammer Toe Repair Bilateral 12/29/2013   Lt #3, #4, Rt #2.    OB History   No obstetric history on file.      Home Medications    Prior to Admission medications   Medication Sig Start Date End Date Taking? Authorizing Provider  ciprofloxacin (CILOXAN) 0.3 % ophthalmic ointment Place into the left eye 4 (four) times daily for 7 days. 10/24/18 10/31/18  Tasia Catchings, Rolanda Campa V, PA-C  cloNIDine (CATAPRES) 0.1 MG tablet TAKE 1 TABLET TWICE A DAY 12/26/16   Debbrah Alar, NP  fluticasone (FLONASE) 50 MCG/ACT nasal spray Place 2 sprays into both nostrils daily as needed for allergies or rhinitis. 06/26/16   Bobbitt, [place] Muta, MD  levocetirizine (XYZAL) 5 MG tablet Take 1 tablet (5 mg total) by mouth every evening. 06/26/16   Bobbitt, [place] Muta, MD  metoprolol succinate (TOPROL XL) 100 MG 24 hr tablet Take 1.5 tablets by mouth once daily. 04/21/17   Debbrah Alar, NP  Multiple Vitamin (MULTIVITAMIN) tablet Take 1 tablet by mouth daily.      [provider]  olopatadine (PATANOL) 0.1 % ophthalmic solution Place 1 drop into both eyes 2 (two) times daily as needed for allergies. 06/26/16   Bobbitt, Sed[place] Muta, MD  potassium chloride SA (K-DUR) 20 MEQ tablet Take 2 tablets (40 mEq total) by mouth 3 (three) times daily. 08/20/18   Debbrah Alar, NP  Vitamin D, Ergocalciferol, (DRISDOL) 1.25 MG (50000 UT) CAPS capsule Take 1 capsule (50,000 Units total) by mouth every 7 (seven) days. 02/17/18   Ann Held, DO  spironolactone (ALDACTONE) 50 MG tablet Take 1 tablet (50 mg total) by mouth daily. 08/20/18 10/24/18   Debbrah Alar, NP    Family History Family History  Problem Relation Age of Onset  . Allergies Daughter   . Allergic rhinitis Daughter   . Allergies Son   . Allergic rhinitis Son   . Leukemia Paternal Grandmother   . Arthritis Mother   . Hyperlipidemia Mother   . Hypertension Mother   . Stroke Mother   . Allergic rhinitis Mother   . Hypertension Father   . Diabetes Father   . Arthritis Maternal Grandmother   . Stroke Maternal Grandmother 50  . Hypertension Maternal Grandmother   . Heart attack Maternal Grandmother 50  . Coronary artery disease Maternal Aunt 50  . Coronary artery disease Maternal Aunt 50  . Angioedema Neg Hx   . Asthma Neg Hx   . Eczema Neg Hx   . Immunodeficiency Neg Hx   . Urticaria Neg Hx     Social History Social History   Tobacco Use  . Smoking status: Never Smoker  . Smokeless tobacco: Never Used  Substance Use Topics  . Alcohol use: No    Alcohol/week: 0.0 standard drinks  . Drug use: No     Allergies   Amoxicillin and Ace inhibitors   Review of Systems Review of Systems  Reason unable to perform ROS: See HPI as above.     Physical Exam Triage Vital Signs ED Triage Vitals  Enc Vitals Group     BP 10/24/18 1000 130/87     Pulse Rate 10/24/18 1000 73     Resp 10/24/18 1000 18     Temp 10/24/18 1000 98.3 F (36.8 C)     Temp Source 10/24/18 1000 Oral     SpO2 10/24/18 1000 98 %     Weight --      Height --      Head Circumference --      Peak Flow --      Pain Score 10/24/18 1001 10     Pain Loc --      Pain Edu? --      Excl. in [place]? --    No data found.  Updated Vital Signs BP 130/87 (BP Location: Left Arm)   Pulse 73   Temp 98.3 F (36.8 C) (Oral)   Resp 18   SpO2 98%   Visual Acuity Right Eye Distance:   Left Eye Distance:   Bilateral Distance:    Right Eye Near: R Near: 20/20 Left Eye Near:  L Near: 20/20 Bilateral Near:  20/20  Physical Exam Constitutional:      General: She is not in acute  distress.    Appearance: She is well-developed. She is not diaphoretic.  HENT:     Head: Normocephalic and atraumatic.  Eyes:     Pupils: Pupils are equal, round, and reactive to light.      Comments: Mild swelling to the left upper lid without erythema,  warmth, painful palpation.  EOM is full.  Left eye with diffuse conjunctival injection.  No chemosis.  Patient with significant direct and consensual photophobia.  Pupils round, fixed.  Fluorescein stain with uptake to defect at 4:00 region.  Negative Seidel sign.  Neurological:     Mental Status: She is alert and oriented to person, place, and time.      UC Treatments / Results  Labs (all labs ordered are listed, but only abnormal results are displayed) Labs Reviewed - No data to display  EKG   Radiology No results found.  Procedures Procedures (including critical care time)  Medications Ordered in UC Medications - No data to display  Initial Impression / Assessment and Plan / UC Course  I have reviewed the triage vital signs and the nursing notes.  Pertinent labs & imaging results that were available during my care of the patient were reviewed by me and considered in my medical decision making (see chart for details).    History and exam concerning for corneal ulcer.  However, given significant direct and consensual photophobia with fixed pupils, discussed case with on-call ophthalmology, Dr. Alanda Slim, who suggested starting ciprofloxacin ophthalmic drop, and follow-up in office in morning.  Rx called into pharmacy.  Resources provided.  Patient to contact office at 8 AM for appointment 10/25/2018.  If unable to obtain appointment, to call this office for assistance.  Return precautions given.  Patient expresses understanding and agrees to plan.   Final Clinical Impressions(s) / UC Diagnoses   Final diagnoses:  Ulcer of left cornea   ED Prescriptions    Medication Sig Dispense Auth. Provider   ciprofloxacin  (CILOXAN) 0.3 % ophthalmic ointment Place into the left eye 4 (four) times daily for 7 days. 3.5 g Ok Edwards, PA-C     PDMP not reviewed this encounter.   Ok Edwards, PA-C 10/24/18 1043

## 2018-10-24 NOTE — Discharge Instructions (Signed)
Start ciprofloxacin eyedrop as directed.  Follow-up with ophthalmology tomorrow for further evaluation and management needed.  If significant worsening of symptoms overnight, go to the ED for further evaluation.

## 2018-10-25 ENCOUNTER — Telehealth: Payer: Self-pay

## 2018-10-25 DIAGNOSIS — H16002 Unspecified corneal ulcer, left eye: Secondary | ICD-10-CM | POA: Diagnosis not present

## 2018-10-29 DIAGNOSIS — H16002 Unspecified corneal ulcer, left eye: Secondary | ICD-10-CM | POA: Diagnosis not present

## 2018-11-05 DIAGNOSIS — H16002 Unspecified corneal ulcer, left eye: Secondary | ICD-10-CM | POA: Diagnosis not present

## 2018-11-19 DIAGNOSIS — H16002 Unspecified corneal ulcer, left eye: Secondary | ICD-10-CM | POA: Diagnosis not present

## 2018-12-01 ENCOUNTER — Ambulatory Visit
Admission: RE | Admit: 2018-12-01 | Discharge: 2018-12-01 | Disposition: A | Discharge status: Home / Self Care | Payer: BC Managed Care – PPO | Source: Ambulatory Visit | Attending: Family | Admitting: Family

## 2018-12-01 ENCOUNTER — Other Ambulatory Visit: Payer: Self-pay

## 2018-12-01 DIAGNOSIS — Z1231 Encounter for screening mammogram for malignant neoplasm of breast: Secondary | ICD-10-CM | POA: Diagnosis not present

## 2018-12-10 DIAGNOSIS — H16002 Unspecified corneal ulcer, left eye: Secondary | ICD-10-CM | POA: Diagnosis not present

## 2018-12-30 DIAGNOSIS — Z20828 Contact with and (suspected) exposure to other viral communicable diseases: Secondary | ICD-10-CM | POA: Diagnosis not present

## 2019-02-16 ENCOUNTER — Encounter: Payer: BC Managed Care – PPO | Admitting: Family

## 2019-02-16 ENCOUNTER — Ambulatory Visit (INDEPENDENT_AMBULATORY_CARE_PROVIDER_SITE_OTHER): Payer: BC Managed Care – PPO | Admitting: Family

## 2019-02-16 ENCOUNTER — Telehealth: Payer: Self-pay | Admitting: *Deleted

## 2019-02-16 ENCOUNTER — Other Ambulatory Visit: Payer: Self-pay

## 2019-02-16 ENCOUNTER — Encounter: Payer: Self-pay | Admitting: Family

## 2019-02-16 VITALS — BP 126/82 | HR 72 | Temp 96.8°F | Resp 16 | Ht 64.0 in | Wt 164.0 lb

## 2019-02-16 DIAGNOSIS — I10 Essential (primary) hypertension: Secondary | ICD-10-CM | POA: Diagnosis not present

## 2019-02-16 DIAGNOSIS — E876 Hypokalemia: Secondary | ICD-10-CM

## 2019-02-16 DIAGNOSIS — Z23 Encounter for immunization: Secondary | ICD-10-CM

## 2019-02-16 DIAGNOSIS — L708 Other acne: Secondary | ICD-10-CM | POA: Diagnosis not present

## 2019-02-16 DIAGNOSIS — F418 Other specified anxiety disorders: Secondary | ICD-10-CM | POA: Diagnosis not present

## 2019-02-16 DIAGNOSIS — Z0000 Encounter for general adult medical examination without abnormal findings: Secondary | ICD-10-CM

## 2019-02-16 LAB — BASIC METABOLIC PANEL
BUN: 13 mg/dL (ref 6–23)
CO2: 30 mEq/L (ref 19–32)
Calcium: 9.5 mg/dL (ref 8.4–10.5)
Chloride: 97 mEq/L (ref 96–112)
Creatinine, Ser: 0.85 mg/dL (ref 0.40–1.20)
GFR: 88.77 mL/min (ref >60.00)
Glucose, Bld: 84 mg/dL (ref 70–99)
Potassium: 2.7 mEq/L — CL (ref 3.5–5.1)
Sodium: 138 mEq/L (ref 135–145)

## 2019-02-16 LAB — LIPID PANEL
Cholesterol: 236 mg/dL — ABNORMAL HIGH (ref 0–200)
HDL: 65.7 mg/dL (ref >39.00)
LDL Cholesterol: 155 mg/dL — ABNORMAL HIGH (ref 0–99)
NonHDL: 170.08
Total CHOL/HDL Ratio: 4
Triglycerides: 74 mg/dL (ref 0.0–149.0)
VLDL: 14.8 mg/dL (ref 0.0–40.0)

## 2019-02-16 LAB — CBC WITH DIFFERENTIAL/PLATELET
Basophils Absolute: 0.1 10*3/uL (ref 0.0–0.1)
Basophils Relative: 1 % (ref 0.0–3.0)
Eosinophils Absolute: 0 10*3/uL (ref 0.0–0.7)
Eosinophils Relative: 0.3 % (ref 0.0–5.0)
HCT: 37 % (ref 36.0–46.0)
Hemoglobin: 12.7 g/dL (ref 12.0–15.0)
Lymphocytes Relative: 27 % (ref 12.0–46.0)
Lymphs Abs: 1.8 10*3/uL (ref 0.7–4.0)
MCHC: 34.4 g/dL (ref 30.0–36.0)
MCV: 91.4 fl (ref 78.0–100.0)
Monocytes Absolute: 0.5 10*3/uL (ref 0.1–1.0)
Monocytes Relative: 7.2 % (ref 3.0–12.0)
Neutro Abs: 4.3 10*3/uL (ref 1.4–7.7)
Neutrophils Relative %: 64.5 % (ref 43.0–77.0)
Platelets: 388 10*3/uL (ref 150.0–400.0)
RBC: 4.04 Mil/uL (ref 3.87–5.11)
RDW: 13.7 % (ref 11.5–15.5)
WBC: 6.6 10*3/uL (ref 4.0–10.5)

## 2019-02-16 LAB — TSH: TSH: 1.45 u[IU]/mL (ref 0.35–4.50)

## 2019-02-16 LAB — HEPATIC FUNCTION PANEL
ALT: 16 U/L (ref 0–35)
AST: 21 U/L (ref 0–37)
Albumin: 4.6 g/dL (ref 3.5–5.2)
Alkaline Phosphatase: 65 U/L (ref 39–117)
Bilirubin, Direct: 0.1 mg/dL (ref 0.0–0.3)
Total Bilirubin: 0.8 mg/dL (ref 0.2–1.2)
Total Protein: 7.7 g/dL (ref 6.0–8.3)

## 2019-02-16 MED ORDER — SPIRONOLACTONE 50 MG PO TABS: 50.0000 mg | ORAL_TABLET | Freq: Every day | ORAL | 2 refills | Status: DC

## 2019-02-16 MED ORDER — POTASSIUM CHLORIDE CRYS ER 20 MEQ PO TBCR: EXTENDED_RELEASE_TABLET | ORAL | 0 refills | Status: DC

## 2019-02-16 NOTE — Telephone Encounter (Signed)
Called patient a few times but no answer, left detail message on the phone about results and to start kdur with 2 tabs asap.  Patient advised to call back for additional instructions and lab appointment to repeat bmet on Monday.  MyChart message also sent out with same information.

## 2019-02-16 NOTE — Telephone Encounter (Signed)
Talked to patient earlier today and she had taken  the first 2 tablets of k-dur as indicated on the message. I gave her instructions to take 2 more tonight and to take one a day starting tomorrow. She was scheduled to come in Monday morning for labs.

## 2019-02-16 NOTE — Progress Notes (Signed)
Subjective:    Patient ID: Nicole Quinn, female    DOB: 1975-05-18, 44 y.o.   MRN: [id number]  HPI  Patient presents today for complete physical.  Immunizations: Tdap 2015, would like flu shot Diet: working on weight loss Exercise: goes to the GYM, weights and cardio Wt Readings from Last 3 Encounters:  02/16/19 164 lb (74.4 kg)  04/05/18 176 lb (79.8 kg)  04/02/18 170 lb (77.1 kg)  Pap Smear: N/A Mammogram: 11/20 Dental: up to date Vision: last week  HTN-  BP Readings from Last 3 Encounters:  02/16/19 126/82  10/24/18 130/87  04/05/18 (!) 124/91   Acne- previously on aldactone.  Reports acne comes and goes.       Review of Systems  Constitutional: Negative for unexpected weight change.  HENT: Negative for hearing loss and rhinorrhea.   Eyes: Negative for visual disturbance.  Respiratory: Negative for cough and shortness of breath.   Cardiovascular: Negative for chest pain.  Gastrointestinal: Positive for constipation (tries to increase her fiber). Negative for blood in stool and diarrhea.  Genitourinary: Negative for dysuria, frequency and hematuria.  Musculoskeletal: Negative for arthralgias and myalgias.  Skin: Negative for rash.  Neurological: Negative for headaches.  Hematological: Negative for adenopathy.  Psychiatric/Behavioral:       Reports depression/anxiety is stable   Past Medical History:  Diagnosis Date  . Allergy   . Anemia   . Endometriosis   . History of echocardiogram    Echo 6/18: EF 60-65, no RWMA, normal diastolic function  . History of nuclear stress test    Myoview 6/18: EF 60, normal perfusion, Low Risk  . History of UTI   . Hypertension    pregnancy induced HTN/pre-eclampsia //  on meds since 2nd child (2000)     Social History   Socioeconomic History  . Marital status: Married    Spouse name: Not on file  . Number of children: 2  . Years of education: Not on file  . 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 Smoking status: Never Smoker  . Smokeless tobacco: Never Used  Substance and Sexual Activity  . Alcohol use: No    Alcohol/week: 0.0 standard drinks  . Drug use: No  . Sexual activity: Not on file  Other Topics Concern  . Not on file  Social History Narrative   Regular exercise: yes.   Married.  Lives with one child.  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        Social Determinants of Health   Financial Resource Strain:   . Difficulty of Paying Living Expenses: Not on file  Food Insecurity:   . Worried About Charity fundraiser in the Last Year: Not on file  . Ran Out of Food in the Last Year: Not on file  Transportation Needs:   . Lack of Transportation (Medical): Not on file  . Lack of Transportation (Non-Medical): Not on file  Physical Activity:   . Days of Exercise per Week: Not on file  . Minutes of Exercise per Session: Not on file  Stress:   . Feeling of Stress : Not on file  Social Connections:   . Frequency of Communication with Friends and Family: Not on file  . Frequency of Social Gatherings with Friends and Family: Not on file  . Attends Religious Services: Not on file  .  Active Member of Clubs or Organizations: Not on file  . Attends Archivist Meetings: Not on file  . Marital Status: Not on file  Intimate Partner Violence:   . Fear of Current or Ex-Partner: Not on file  . Emotionally Abused: Not on file  . Physically Abused: Not on file  . Sexually Abused: Not on file    Past Surgical History:  Procedure Laterality Date  . ABDOMINAL HYSTERECTOMY  2003   partial  . BREAST BIOPSY Left   . CARPAL TUNNEL RELEASE     right  . GANGLION CYST EXCISION     right  . Hammer Toe Repair Bilateral 12/29/2013   Lt #3, #4, Rt #2.    Family History  Problem Relation Age of Onset  . Allergies Daughter   . Allergic rhinitis  Daughter   . Allergies Son   . Allergic rhinitis Son   . Leukemia Paternal Grandmother   . Arthritis Mother   . Hyperlipidemia Mother   . Hypertension Mother   . Stroke Mother   . Allergic rhinitis Mother   . Hypertension Father   . Diabetes Father   . Arthritis Maternal Grandmother   . Stroke Maternal Grandmother 50  . Hypertension Maternal Grandmother   . Heart attack Maternal Grandmother 50  . Coronary artery disease Maternal Aunt 50  . Coronary artery disease Maternal Aunt 50  . Angioedema Neg Hx   . Asthma Neg Hx   . Eczema Neg Hx   . Immunodeficiency Neg Hx   . Urticaria Neg Hx     Allergies  Allergen Reactions  . Amoxicillin     rash  . Ace Inhibitors     REACTION: lip swelling    Current Outpatient Medications on File Prior to Visit  Medication Sig Dispense Refill  . Multiple Vitamin (MULTIVITAMIN) tablet Take 1 tablet by mouth daily.      . [DISCONTINUED] spironolactone (ALDACTONE) 50 MG tablet Take 1 tablet (50 mg total) by mouth daily. 30 tablet 2   No current facility-administered medications on file prior to visit.    BP 126/82 (BP Location: Left Arm, Patient Position: Sitting, Cuff Size: Normal)   Pulse 72   Temp (!) 96.8 F (36 C) (Temporal)   Resp 16   Ht 5\' 4"  (1.626 m)   Wt 164 lb (74.4 kg)   SpO2 99%   BMI 28.15 kg/m       Objective:   Physical Exam Physical Exam  Constitutional: She is oriented to person, place, and time. She appears well-developed and well-nourished. No distress.  HENT:  Head: Normocephalic and atraumatic.  Right Ear: Tympanic membrane and ear canal normal.  Left Ear: Tympanic membrane and ear canal normal.  Mouth/Throat: Not examined- pt wearing mask Eyes: Pupils are equal, round, and reactive to light. No scleral icterus.  Neck: Normal range of motion. No thyromegaly present.  Cardiovascular: Normal rate and regular rhythm.   No murmur heard. Pulmonary/Chest: Effort normal and breath sounds normal. No  respiratory distress. He has no wheezes. She has no rales. She exhibits no tenderness.  Abdominal: Soft. Bowel sounds are normal. She exhibits no distension and no mass. There is no tenderness. There is no rebound and no guarding.  Musculoskeletal: She exhibits no edema.  Lymphadenopathy:    She has no cervical adenopathy.  Neurological: She is alert and oriented to person, place, and time. She has normal patellar reflexes. She exhibits normal muscle tone. Coordination normal.  Skin: Skin is warm  and dry. mild acne noted Psychiatric: She has a normal mood and affect. Her behavior is normal. Judgment and thought content normal.  Breasts: Examined lying Right: Without masses, retractions, discharge or axillary adenopathy.  Left: Without masses, retractions, discharge or axillary adenopathy. l            Assessment & Plan:   Preventative care-commended pt on her healthy diet, exercise and weight loss efforts. Flu shot today. Mammo up to date, no need for pap as she is s/p hysterectomy.   HTN- bp looks great following her recent weight loss despite being off of medication.  Anxiety/depression- pt feels that this is currently stable off of meds.    Acne-fair off of aldactone. Will see how her potassium looks. If low, consider restarting aldactone for acne/hypokalemia.   This visit occurred during the SARS-CoV-2 public health emergency.  Safety protocols were in place, including screening questions prior to the visit, additional usage of staff PPE, and extensive cleaning of exam room while observing appropriate contact time as indicated for disinfecting solutions.        Assessment & Plan:

## 2019-02-16 NOTE — Patient Instructions (Signed)
Please complete lab work prior to leaving. Continue the great work with diet and exercise.

## 2019-02-16 NOTE — Telephone Encounter (Signed)
Please contact pt and advise her that her potassium is dangerously low. I would like her to restart aldactone (this will help keep her potassium up and help with acne). Begin Kdur as follows:  Take 2 tabs by mouth now, then 2 tabs by mouth tonight. Begin one tab once daily starting 1/28.   Repeat bmet on Monday, dx hypokalemia.  Does she have a blood pressure cuff? Please send me her blood pressure readings after she starts aldactone.

## 2019-02-16 NOTE — Addendum Note (Signed)
Addended by: Wynonia Musty A on: 02/16/2019 07:43 AM   Modules accepted: Orders

## 2019-02-16 NOTE — Telephone Encounter (Signed)
CRITICAL VALUE STICKER  CRITICAL VALUE:  Potassium  2.7  RECEIVER (on-site recipient of call):  Kelle Darting, [place] NOTIFIED:  02/16/19 @ 11:47am  MESSENGER (representative from lab): Lethea Killings  MD NOTIFIED:  Dennis Bast, CMA  TIME OF NOTIFICATION:  11:53  RESPONSE:

## 2019-02-21 ENCOUNTER — Other Ambulatory Visit: Payer: Self-pay

## 2019-02-21 ENCOUNTER — Telehealth: Payer: Self-pay | Admitting: *Deleted

## 2019-02-21 ENCOUNTER — Other Ambulatory Visit (INDEPENDENT_AMBULATORY_CARE_PROVIDER_SITE_OTHER): Payer: BC Managed Care – PPO

## 2019-02-21 DIAGNOSIS — E876 Hypokalemia: Secondary | ICD-10-CM

## 2019-02-21 LAB — BASIC METABOLIC PANEL
BUN: 20 mg/dL (ref 6–23)
CO2: 32 mEq/L (ref 19–32)
Calcium: 9.1 mg/dL (ref 8.4–10.5)
Chloride: 95 mEq/L — ABNORMAL LOW (ref 96–112)
Creatinine, Ser: 0.89 mg/dL (ref 0.40–1.20)
GFR: 84.18 mL/min (ref >60.00)
Glucose, Bld: 95 mg/dL (ref 70–99)
Potassium: 2.8 mEq/L — CL (ref 3.5–5.1)
Sodium: 138 mEq/L (ref 135–145)

## 2019-02-21 MED ORDER — POTASSIUM CHLORIDE CRYS ER 20 MEQ PO TBCR: 40.0000 meq | EXTENDED_RELEASE_TABLET | Freq: Two times a day (BID) | ORAL | 2 refills | Status: DC

## 2019-02-21 NOTE — Telephone Encounter (Signed)
CRITICAL VALUE STICKER  CRITICAL VALUE: Potassium  2.8  RECEIVER (on-site recipient of call):  Aldona Lento  DATE & TIME NOTIFIED:  02/21/19 @ 1:15pm  MESSENGER (representative from lab): [place]  MD NOTIFIED:  Lenna Sciara & CMA Rod Holler)  TIME OF NOTIFICATION:  1:34pm  RESPONSE:

## 2019-02-21 NOTE — Telephone Encounter (Signed)
Lvm for patient to call about abnormal labs. Mychart message also sent with information.

## 2019-02-21 NOTE — Telephone Encounter (Signed)
Patient advised of results, new medication instructions and scheduled for bmet on 02-24-2019

## 2019-02-21 NOTE — Telephone Encounter (Signed)
Left message on voicemail requesting that pt call us back this afternoon for further instructions.   I would like pt to increase potassium to 2 tabs bid starting today. Repeat bmet on Thursday.   Refill sent to her pharmacy so she does not run out.   Go to ER if she develops palpitations/weakness/shortness of breath.

## 2019-02-22 ENCOUNTER — Telehealth: Payer: Self-pay

## 2019-02-22 NOTE — Telephone Encounter (Signed)
Aniceto Boss called from the Patient's Pharmacy at the   [facility] in [place] needing  instructions on how to fill the patients prescription. Per the pharmacy tech Dr. Conley Canal wrote it for several instructions  please call Aniceto Boss as soon as possible with clear instructions per the pharmacy  at [phone])    Thanks

## 2019-02-22 NOTE — Telephone Encounter (Signed)
Pharmacist advised to fill as 2 tablets 2 times a day.

## 2019-02-24 ENCOUNTER — Other Ambulatory Visit (INDEPENDENT_AMBULATORY_CARE_PROVIDER_SITE_OTHER): Payer: BC Managed Care – PPO

## 2019-02-24 ENCOUNTER — Other Ambulatory Visit: Payer: Self-pay

## 2019-02-24 DIAGNOSIS — E876 Hypokalemia: Secondary | ICD-10-CM | POA: Diagnosis not present

## 2019-02-24 LAB — BASIC METABOLIC PANEL
BUN: 13 mg/dL (ref 6–23)
CO2: 33 mEq/L — ABNORMAL HIGH (ref 19–32)
Calcium: 9.7 mg/dL (ref 8.4–10.5)
Chloride: 97 mEq/L (ref 96–112)
Creatinine, Ser: 0.76 mg/dL (ref 0.40–1.20)
GFR: 101 mL/min (ref >60.00)
Glucose, Bld: 91 mg/dL (ref 70–99)
Potassium: 3.3 mEq/L — ABNORMAL LOW (ref 3.5–5.1)
Sodium: 135 mEq/L (ref 135–145)

## 2019-02-25 ENCOUNTER — Telehealth: Payer: Self-pay | Admitting: Family

## 2019-02-25 NOTE — Telephone Encounter (Signed)
Patient advised to increase her medication to 2 tablets 3 times a day and scheduled for repeat bmet on Tuesday 03-01-2019

## 2019-02-25 NOTE — Telephone Encounter (Signed)
Potassium is improving.  I would like her to increase potassium from 2 tabs bid to 2 tabs 3x daily.  Repeat potassium on Monday or Tuesday of next week. Dx hypokalemia.

## 2019-02-28 ENCOUNTER — Other Ambulatory Visit: Payer: Self-pay | Admitting: Emergency Medicine

## 2019-02-28 DIAGNOSIS — E876 Hypokalemia: Secondary | ICD-10-CM

## 2019-03-01 ENCOUNTER — Other Ambulatory Visit (INDEPENDENT_AMBULATORY_CARE_PROVIDER_SITE_OTHER): Payer: BC Managed Care – PPO

## 2019-03-01 ENCOUNTER — Encounter: Payer: Self-pay | Admitting: Family

## 2019-03-01 ENCOUNTER — Other Ambulatory Visit: Payer: Self-pay

## 2019-03-01 DIAGNOSIS — E876 Hypokalemia: Secondary | ICD-10-CM | POA: Diagnosis not present

## 2019-03-01 LAB — BASIC METABOLIC PANEL
BUN: 12 mg/dL (ref 6–23)
CO2: 31 mEq/L (ref 19–32)
Calcium: 9.5 mg/dL (ref 8.4–10.5)
Chloride: 99 mEq/L (ref 96–112)
Creatinine, Ser: 0.9 mg/dL (ref 0.40–1.20)
GFR: 83.09 mL/min (ref >60.00)
Glucose, Bld: 84 mg/dL (ref 70–99)
Potassium: 3.9 mEq/L (ref 3.5–5.1)
Sodium: 137 mEq/L (ref 135–145)

## 2019-03-16 ENCOUNTER — Encounter: Payer: Self-pay | Admitting: Family

## 2019-03-29 ENCOUNTER — Other Ambulatory Visit: Payer: Self-pay

## 2019-03-29 ENCOUNTER — Ambulatory Visit (INDEPENDENT_AMBULATORY_CARE_PROVIDER_SITE_OTHER): Payer: BC Managed Care – PPO

## 2019-03-29 DIAGNOSIS — Z111 Encounter for screening for respiratory tuberculosis: Secondary | ICD-10-CM

## 2019-03-29 NOTE — Progress Notes (Signed)
Patient here today for tb skin test. 0.64mL placed in left fore arm. Wheel present. Patient will come in Thursday to have it read.

## 2019-04-04 ENCOUNTER — Encounter: Payer: Self-pay | Admitting: Family

## 2019-04-07 ENCOUNTER — Other Ambulatory Visit: Payer: Self-pay

## 2019-04-07 ENCOUNTER — Other Ambulatory Visit (INDEPENDENT_AMBULATORY_CARE_PROVIDER_SITE_OTHER): Payer: BC Managed Care – PPO

## 2019-04-07 ENCOUNTER — Ambulatory Visit: Payer: BC Managed Care – PPO

## 2019-04-07 DIAGNOSIS — Z111 Encounter for screening for respiratory tuberculosis: Secondary | ICD-10-CM

## 2019-04-09 LAB — QUANTIFERON-TB GOLD PLUS
Mitogen-NIL: >10 IU/mL
NIL: 0.03 IU/mL
QuantiFERON-TB Gold Plus: NEGATIVE
TB1-NIL: 0.01 IU/mL
TB2-NIL: 0.01 IU/mL

## 2019-05-08 DIAGNOSIS — Z20828 Contact with and (suspected) exposure to other viral communicable diseases: Secondary | ICD-10-CM | POA: Diagnosis not present

## 2019-07-13 ENCOUNTER — Encounter: Payer: Self-pay | Admitting: Family

## 2019-07-27 ENCOUNTER — Ambulatory Visit (INDEPENDENT_AMBULATORY_CARE_PROVIDER_SITE_OTHER): Payer: PRIVATE HEALTH INSURANCE | Admitting: Family

## 2019-07-27 ENCOUNTER — Other Ambulatory Visit: Payer: Self-pay

## 2019-07-27 ENCOUNTER — Encounter: Payer: Self-pay | Admitting: Family

## 2019-07-27 ENCOUNTER — Other Ambulatory Visit: Payer: PRIVATE HEALTH INSURANCE

## 2019-07-27 ENCOUNTER — Other Ambulatory Visit (INDEPENDENT_AMBULATORY_CARE_PROVIDER_SITE_OTHER): Payer: PRIVATE HEALTH INSURANCE

## 2019-07-27 VITALS — BP 124/84 | HR 74 | Resp 16 | Ht 64.0 in | Wt 178.0 lb

## 2019-07-27 DIAGNOSIS — R0609 Other forms of dyspnea: Secondary | ICD-10-CM

## 2019-07-27 DIAGNOSIS — D649 Anemia, unspecified: Secondary | ICD-10-CM | POA: Diagnosis not present

## 2019-07-27 DIAGNOSIS — R5383 Other fatigue: Secondary | ICD-10-CM | POA: Diagnosis not present

## 2019-07-27 DIAGNOSIS — R0600 Dyspnea, unspecified: Secondary | ICD-10-CM | POA: Diagnosis not present

## 2019-07-27 DIAGNOSIS — E876 Hypokalemia: Secondary | ICD-10-CM | POA: Diagnosis not present

## 2019-07-27 LAB — CBC WITH DIFFERENTIAL/PLATELET
Basophils Absolute: 0.1 10*3/uL (ref 0.0–0.1)
Basophils Relative: 0.7 % (ref 0.0–3.0)
Eosinophils Absolute: 0.1 10*3/uL (ref 0.0–0.7)
Eosinophils Relative: 1.4 % (ref 0.0–5.0)
HCT: 34.6 % — ABNORMAL LOW (ref 36.0–46.0)
Hemoglobin: 11.8 g/dL — ABNORMAL LOW (ref 12.0–15.0)
Lymphocytes Relative: 37.4 % (ref 12.0–46.0)
Lymphs Abs: 2.7 10*3/uL (ref 0.7–4.0)
MCHC: 34 g/dL (ref 30.0–36.0)
MCV: 93.4 fl (ref 78.0–100.0)
Monocytes Absolute: 0.4 10*3/uL (ref 0.1–1.0)
Monocytes Relative: 6.1 % (ref 3.0–12.0)
Neutro Abs: 3.9 10*3/uL (ref 1.4–7.7)
Neutrophils Relative %: 54.4 % (ref 43.0–77.0)
Platelets: 330 10*3/uL (ref 150.0–400.0)
RBC: 3.71 Mil/uL — ABNORMAL LOW (ref 3.87–5.11)
RDW: 13 % (ref 11.5–15.5)
WBC: 7.1 10*3/uL (ref 4.0–10.5)

## 2019-07-27 LAB — COMPREHENSIVE METABOLIC PANEL
ALT: 16 U/L (ref 0–35)
AST: 15 U/L (ref 0–37)
Albumin: 4.4 g/dL (ref 3.5–5.2)
Alkaline Phosphatase: 62 U/L (ref 39–117)
BUN: 19 mg/dL (ref 6–23)
CO2: 35 mEq/L — ABNORMAL HIGH (ref 19–32)
Calcium: 10 mg/dL (ref 8.4–10.5)
Chloride: 93 mEq/L — ABNORMAL LOW (ref 96–112)
Creatinine, Ser: 0.76 mg/dL (ref 0.40–1.20)
GFR: 100.79 mL/min (ref >60.00)
Glucose, Bld: 87 mg/dL (ref 70–99)
Potassium: 3.1 mEq/L — ABNORMAL LOW (ref 3.5–5.1)
Sodium: 137 mEq/L (ref 135–145)
Total Bilirubin: 0.3 mg/dL (ref 0.2–1.2)
Total Protein: 7.1 g/dL (ref 6.0–8.3)

## 2019-07-27 LAB — VITAMIN B12: Vitamin B-12: >1526 pg/mL — ABNORMAL HIGH (ref 211–911)

## 2019-07-27 LAB — BRAIN NATRIURETIC PEPTIDE: Pro B Natriuretic peptide (BNP): 48 pg/mL (ref 0.0–100.0)

## 2019-07-27 LAB — IRON: Iron: 67 ug/dL (ref 42–145)

## 2019-07-27 LAB — TSH: TSH: 1.51 u[IU]/mL (ref 0.35–4.50)

## 2019-07-27 LAB — FERRITIN: Ferritin: 49.4 ng/mL (ref 10.0–291.0)

## 2019-07-27 LAB — FOLATE: Folate: 8 ng/mL (ref >5.9)

## 2019-07-27 NOTE — Progress Notes (Signed)
Could you please ask the lab to add on serum iron, ferritin, b12, folate, dx anemia?

## 2019-07-27 NOTE — Patient Instructions (Signed)
Please complete lab work prior to leaving. You should be contacted about scheduling your echocardiogram.

## 2019-07-27 NOTE — Progress Notes (Signed)
Subjective:    Patient ID: Nicole Quinn, female    DOB: [birth year], 44 y.o.   MRN: [id number]  HPI  Patient presents today for follow up.   Hypokalemia-she reports good compliance with her K. Dur 3 times daily.  She also continues Aldactone.   Reports that she is "extra tired."  She reports some sob with exercise.  Reports that she often has to stop during exercise due to shortness of breath.  This is not typical for her.  She denies any chest pain, calf pain, or recent long travel trips.  Wt Readings from Last 3 Encounters:  07/27/19 178 lb (80.7 kg)  02/16/19 164 lb (74.4 kg)  04/05/18 176 lb (79.8 kg)    Review of Systems See HPI    Past Medical History:  Diagnosis Date  . Allergy   . Anemia   . Endometriosis   . History of echocardiogram    Echo 6/18: EF 60-65, no RWMA, normal diastolic function  . History of nuclear stress test    Myoview 6/18: EF 60, normal perfusion, Low Risk  . History of UTI   . Hypertension    pregnancy induced HTN/pre-eclampsia //  on meds since 2nd child (2000)     Social History   Socioeconomic History  . Marital status: Married    Spouse name: Not on file  . Number of children: 2  . Years of education: Not on file  . 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 Smoking status: Never Smoker  . Smokeless tobacco: Never Used  Vaping Use  . Vaping Use: Never used  Substance and Sexual Activity  . Alcohol use: No    Alcohol/week: 0.0 standard drinks  . Drug use: No  . Sexual activity: Not on file  Other Topics Concern  . Not on file  Social History Narrative   Regular exercise: yes.   Married.  Lives with one child.  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        Social Determinants of Health   Financial Resource Strain:   . Difficulty of Paying Living Expenses:   Food Insecurity:   . Worried  About Charity fundraiser in the Last Year:   . Arboriculturist in the Last Year:   Transportation Needs:   . Film/video editor (Medical):   Marland Kitchen Lack of Transportation (Non-Medical):   Physical Activity:   . Days of Exercise per Week:   . Minutes of Exercise per Session:   Stress:   . Feeling of Stress :   Social Connections:   . Frequency of Communication with Friends and Family:   . Frequency of Social Gatherings with Friends and Family:   . Attends Religious Services:   . Active Member of Clubs or Organizations:   . Attends Archivist Meetings:   Marland Kitchen Marital Status:   Intimate Partner Violence:   . Fear of Current or Ex-Partner:   . Emotionally Abused:   Marland Kitchen Physically Abused:   . Sexually Abused:     Past Surgical History:  Procedure Laterality Date  . ABDOMINAL HYSTERECTOMY  2003   partial  . BREAST BIOPSY Left   . CARPAL TUNNEL RELEASE     right  . GANGLION CYST EXCISION     right  . Hammer Toe Repair Bilateral 12/29/2013   Lt #3, #  4, Rt #2.    Family History  Problem Relation Age of Onset  . Allergies Daughter   . Allergic rhinitis Daughter   . Allergies Son   . Allergic rhinitis Son   . Leukemia Paternal Grandmother   . Arthritis Mother   . Hyperlipidemia Mother   . Hypertension Mother   . Stroke Mother   . Allergic rhinitis Mother   . Hypertension Father   . Diabetes Father   . Arthritis Maternal Grandmother   . Stroke Maternal Grandmother 50  . Hypertension Maternal Grandmother   . Heart attack Maternal Grandmother 50  . Coronary artery disease Maternal Aunt 50  . Coronary artery disease Maternal Aunt 50  . Angioedema Neg Hx   . Asthma Neg Hx   . Eczema Neg Hx   . Immunodeficiency Neg Hx   . Urticaria Neg Hx     Allergies  Allergen Reactions  . Amoxicillin     rash  . Ace Inhibitors     REACTION: lip swelling    Current Outpatient Medications on File Prior to Visit  Medication Sig Dispense Refill  . Multiple Vitamin  (MULTIVITAMIN) tablet Take 1 tablet by mouth daily.      . potassium chloride SA (KLOR-CON) 20 MEQ tablet Take 2 tablets (40 mEq total) by mouth 2 (two) times daily. (Patient taking differently: Take 40 mEq by mouth 3 (three) times daily. ) 120 tablet 2  . spironolactone (ALDACTONE) 50 MG tablet Take 1 tablet (50 mg total) by mouth daily. 30 tablet 2   No current facility-administered medications on file prior to visit.    BP 124/84 (BP Location: Right Arm, Patient Position: Sitting, Cuff Size: Normal)   Pulse 74   Resp 16   Ht 5\' 4"  (1.626 m)   Wt 178 lb (80.7 kg)   SpO2 98%   BMI 30.55 kg/m    Objective:   Physical Exam Constitutional:      Appearance: She is well-developed.  Cardiovascular:     Rate and Rhythm: Normal rate and regular rhythm.     Heart sounds: Normal heart sounds. No murmur heard.   Pulmonary:     Effort: Pulmonary effort is normal. No respiratory distress.     Breath sounds: Normal breath sounds. No wheezing.  Musculoskeletal:     Right lower leg: No edema.     Left lower leg: No edema.  Psychiatric:        Behavior: Behavior normal.        Thought Content: Thought content normal.        Judgment: Judgment normal.           Assessment & Plan:  Hypokalemia-will obtain follow-up electrolytes.  Continue Aldactone and potassium augmentation.  Fatigue-check CBC, TSH.  Dyspnea on exertion-will obtain 2D echo, CBC, and BNP. EKG tracing is personally reviewed.  EKG notes NSR.  No acute changes.    This visit occurred during the SARS-CoV-2 public health emergency.  Safety protocols were in place, including screening questions prior to the visit, additional usage of staff PPE, and extensive cleaning of exam room while observing appropriate contact time as indicated for disinfecting solutions.

## 2019-07-29 ENCOUNTER — Telehealth: Payer: Self-pay | Admitting: Family

## 2019-07-29 DIAGNOSIS — E876 Hypokalemia: Secondary | ICD-10-CM

## 2019-07-29 DIAGNOSIS — D619 Aplastic anemia, unspecified: Secondary | ICD-10-CM

## 2019-07-29 MED ORDER — POTASSIUM CHLORIDE CRYS ER 20 MEQ PO TBCR: EXTENDED_RELEASE_TABLET | ORAL | Status: DC

## 2019-07-29 NOTE — Telephone Encounter (Signed)
Patient advised of provider's advise and to add 1 kdur qhs. She will be here next week for labs and to pick up [place].

## 2019-07-29 NOTE — Telephone Encounter (Signed)
Please let pt know that she is mildly anemic. Iron, b12 and folate levels look good. I would like her to complete an IFOB, dx anemia.   Also, potassium is still low at 3.1.  Let's try adding another tablet of kdur 11meq at bedtime.  So she will take 59meq with breakfast, lunch and dinner and 38meq at bedtime. Repeat bmet in 1 week, dx hypokalemia. I would also like her to schedule follow up with her nephrologist.

## 2019-08-04 ENCOUNTER — Other Ambulatory Visit (INDEPENDENT_AMBULATORY_CARE_PROVIDER_SITE_OTHER): Payer: PRIVATE HEALTH INSURANCE

## 2019-08-04 ENCOUNTER — Other Ambulatory Visit: Payer: Self-pay

## 2019-08-04 DIAGNOSIS — E876 Hypokalemia: Secondary | ICD-10-CM

## 2019-08-04 LAB — BASIC METABOLIC PANEL
BUN: 21 mg/dL (ref 6–23)
CO2: 30 mEq/L (ref 19–32)
Calcium: 10.1 mg/dL (ref 8.4–10.5)
Chloride: 93 mEq/L — ABNORMAL LOW (ref 96–112)
Creatinine, Ser: 1.07 mg/dL (ref 0.40–1.20)
GFR: 67.91 mL/min (ref >60.00)
Glucose, Bld: 88 mg/dL (ref 70–99)
Potassium: 3.6 mEq/L (ref 3.5–5.1)
Sodium: 137 mEq/L (ref 135–145)

## 2019-08-08 ENCOUNTER — Other Ambulatory Visit (INDEPENDENT_AMBULATORY_CARE_PROVIDER_SITE_OTHER): Payer: PRIVATE HEALTH INSURANCE

## 2019-08-08 DIAGNOSIS — D619 Aplastic anemia, unspecified: Secondary | ICD-10-CM | POA: Diagnosis not present

## 2019-08-09 LAB — FECAL OCCULT BLOOD, IMMUNOCHEMICAL: Fecal Occult Bld: NEGATIVE

## 2019-08-17 ENCOUNTER — Ambulatory Visit: Payer: BC Managed Care – PPO | Admitting: Family

## 2019-09-09 ENCOUNTER — Ambulatory Visit (HOSPITAL_BASED_OUTPATIENT_CLINIC_OR_DEPARTMENT_OTHER): Admission: RE | Admit: 2019-09-09 | Payer: PRIVATE HEALTH INSURANCE | Source: Ambulatory Visit

## 2019-11-21 ENCOUNTER — Other Ambulatory Visit: Payer: Self-pay

## 2019-11-21 ENCOUNTER — Encounter: Payer: Self-pay | Admitting: Family

## 2019-11-21 NOTE — Progress Notes (Unsigned)
QuantiFERON-TB Gold Plus Order: [id number] Status:  Final result Visible to patient:  Yes (seen) Next appt:  None Dx:  Screening-pulmonary TB  0 Result Notes   1 Patient Communication  Ref Range & Units 7 mo ago 1 yr ago  QuantiFERON-TB Gold Plus NEGATIVE NEGATIVE  NEGATIVE CM   Comment: Negative test result. M. tuberculosis complex  infection unlikely.   NIL IU/mL 0.03  0.02   Mitogen-NIL IU/mL >10.00  8.01   TB1-NIL IU/mL 0.01  0.00   TB2-NIL IU/mL 0.01  0.00 CM   Comment: .  The Nil tube value reflects the background interferon  gamma immune response of the patient's blood sample.  This value has been subtracted from the patient's  displayed TB and Mitogen results.  .  Lower than expected results with the Mitogen tube  prevent false-negative Quantiferon readings by  detecting a patient with a potential immune  suppressive condition and/or suboptimal pre-analytical  specimen handling.  .  The TB1 Antigen tube is coated with the  M. tuberculosis-specific antigens designed to elicit  responses from TB antigen primed CD4+ helper  T-lymphocytes.  .  The TB2 Antigen tube is coated with the  M. tuberculosis-specific antigens designed to elicit  responses from TB antigen primed CD4+ helper and CD8+  cytotoxic T-lymphocytes.  .  For additional information, please refer to  https://education.questdiagnostics.com/faq/FAQ204  (This link is being provided for informational/  educational purposes only.)  .

## 2019-11-28 ENCOUNTER — Other Ambulatory Visit: Payer: Self-pay | Admitting: Family

## 2020-01-18 ENCOUNTER — Other Ambulatory Visit: Payer: Self-pay | Admitting: Family

## 2020-02-03 ENCOUNTER — Other Ambulatory Visit: Payer: Self-pay | Admitting: Family

## 2020-02-16 DIAGNOSIS — Z03818 Encounter for observation for suspected exposure to other biological agents ruled out: Secondary | ICD-10-CM | POA: Diagnosis not present

## 2020-02-17 ENCOUNTER — Encounter: Payer: PRIVATE HEALTH INSURANCE | Admitting: Family

## 2020-03-13 ENCOUNTER — Encounter: Payer: Self-pay | Admitting: Family

## 2020-03-13 ENCOUNTER — Other Ambulatory Visit: Payer: Self-pay | Admitting: Family

## 2020-03-14 ENCOUNTER — Telehealth: Payer: Self-pay

## 2020-03-14 ENCOUNTER — Encounter: Payer: Self-pay | Admitting: Family

## 2020-03-14 ENCOUNTER — Encounter (HOSPITAL_BASED_OUTPATIENT_CLINIC_OR_DEPARTMENT_OTHER): Payer: Self-pay | Admitting: *Deleted

## 2020-03-14 ENCOUNTER — Emergency Department (HOSPITAL_BASED_OUTPATIENT_CLINIC_OR_DEPARTMENT_OTHER)
Admission: EM | Admit: 2020-03-14 | Discharge: 2020-03-14 | Disposition: A | Discharge status: Home / Self Care | Payer: BC Managed Care – PPO | Attending: Emergency Medicine | Admitting: Emergency Medicine

## 2020-03-14 ENCOUNTER — Ambulatory Visit: Payer: BC Managed Care – PPO | Admitting: Family

## 2020-03-14 ENCOUNTER — Other Ambulatory Visit: Payer: Self-pay

## 2020-03-14 VITALS — BP 116/77 | HR 92 | Temp 98.6°F | Resp 16 | Wt 167.8 lb

## 2020-03-14 DIAGNOSIS — I10 Essential (primary) hypertension: Secondary | ICD-10-CM | POA: Diagnosis not present

## 2020-03-14 DIAGNOSIS — E876 Hypokalemia: Secondary | ICD-10-CM

## 2020-03-14 DIAGNOSIS — Z20822 Contact with and (suspected) exposure to covid-19: Secondary | ICD-10-CM | POA: Insufficient documentation

## 2020-03-14 DIAGNOSIS — R5383 Other fatigue: Secondary | ICD-10-CM | POA: Diagnosis not present

## 2020-03-14 DIAGNOSIS — Z79899 Other long term (current) drug therapy: Secondary | ICD-10-CM | POA: Diagnosis not present

## 2020-03-14 DIAGNOSIS — R002 Palpitations: Secondary | ICD-10-CM

## 2020-03-14 DIAGNOSIS — D649 Anemia, unspecified: Secondary | ICD-10-CM | POA: Insufficient documentation

## 2020-03-14 LAB — COMPREHENSIVE METABOLIC PANEL
ALT: 13 U/L (ref 0–35)
AST: 15 U/L (ref 0–37)
Albumin: 4 g/dL (ref 3.5–5.2)
Alkaline Phosphatase: 66 U/L (ref 39–117)
BUN: 16 mg/dL (ref 6–23)
CO2: 35 mEq/L — ABNORMAL HIGH (ref 19–32)
Calcium: 9 mg/dL (ref 8.4–10.5)
Chloride: 95 mEq/L — ABNORMAL LOW (ref 96–112)
Creatinine, Ser: 1.21 mg/dL — ABNORMAL HIGH (ref 0.40–1.20)
GFR: 55.08 mL/min — ABNORMAL LOW (ref >60.00)
Glucose, Bld: 80 mg/dL (ref 70–99)
Potassium: 2.1 mEq/L — CL (ref 3.5–5.1)
Sodium: 136 mEq/L (ref 135–145)
Total Bilirubin: 0.3 mg/dL (ref 0.2–1.2)
Total Protein: 7.3 g/dL (ref 6.0–8.3)

## 2020-03-14 LAB — CBC WITH DIFFERENTIAL/PLATELET
Basophils Absolute: 0 10*3/uL (ref 0.0–0.1)
Basophils Relative: 0.6 % (ref 0.0–3.0)
Eosinophils Absolute: 0.1 10*3/uL (ref 0.0–0.7)
Eosinophils Relative: 0.8 % (ref 0.0–5.0)
HCT: 33.1 % — ABNORMAL LOW (ref 36.0–46.0)
Hemoglobin: 11.4 g/dL — ABNORMAL LOW (ref 12.0–15.0)
Lymphocytes Relative: 33.3 % (ref 12.0–46.0)
Lymphs Abs: 2.4 10*3/uL (ref 0.7–4.0)
MCHC: 34.6 g/dL (ref 30.0–36.0)
MCV: 89 fl (ref 78.0–100.0)
Monocytes Absolute: 0.6 10*3/uL (ref 0.1–1.0)
Monocytes Relative: 8.8 % (ref 3.0–12.0)
Neutro Abs: 4 10*3/uL (ref 1.4–7.7)
Neutrophils Relative %: 56.5 % (ref 43.0–77.0)
Platelets: 362 10*3/uL (ref 150.0–400.0)
RBC: 3.72 Mil/uL — ABNORMAL LOW (ref 3.87–5.11)
RDW: 12.8 % (ref 11.5–15.5)
WBC: 7.1 10*3/uL (ref 4.0–10.5)

## 2020-03-14 LAB — TSH: TSH: 0.49 u[IU]/mL (ref 0.35–4.50)

## 2020-03-14 LAB — MAGNESIUM: Magnesium: 2 mg/dL (ref 1.7–2.4)

## 2020-03-14 LAB — POTASSIUM: Potassium: 2 mmol/L — CL (ref 3.5–5.1)

## 2020-03-14 MED ORDER — POTASSIUM CHLORIDE CRYS ER 20 MEQ PO TBCR
40.0000 meq | EXTENDED_RELEASE_TABLET | Freq: Once | ORAL | Status: AC
  Administered 2020-03-14: 40 meq via ORAL
  Filled 2020-03-14: qty 2

## 2020-03-14 MED ORDER — POTASSIUM CHLORIDE CRYS ER 20 MEQ PO TBCR: 40.0000 meq | EXTENDED_RELEASE_TABLET | Freq: Three times a day (TID) | ORAL | 5 refills | Status: DC

## 2020-03-14 MED ORDER — POTASSIUM CHLORIDE 10 MEQ/100ML IV SOLN
10.0000 meq | INTRAVENOUS | Status: DC
  Administered 2020-03-14 (×4): 10 meq via INTRAVENOUS
  Filled 2020-03-14 (×4): qty 100

## 2020-03-14 NOTE — ED Provider Notes (Signed)
Assumed care from Wyn Quaker, PA-C at shift change pending IV potassium.  See her chart for full HPI.  In short, patient presents to the ED due to hypokalemia from PCP.  She has a history of hypokalemia is typically on potassium at home.  She admits to feeling poorly for the past 3 days.  8:50 PM IV potassium completed.  Patient admits to symptomatic relief.  Advised patient to follow-up with PCP in the next few days to recheck potassium.  Instructed patient to continue taking potassium as prescribed by her PCP. Discharge paperwork filled out by previous provider. Strict ED precautions discussed with patient. Patient states understanding and agrees to plan. Patient discharged home in no acute distress and stable vitals     Karie Kirks 03/14/20 2053    Dorie Rank, MD 03/15/20 1028

## 2020-03-14 NOTE — Telephone Encounter (Signed)
Provider spoke to patient earlier today and advised her to go to the emergency room.

## 2020-03-14 NOTE — ED Triage Notes (Signed)
Sent her by PMD for K 2.1

## 2020-03-14 NOTE — Discharge Instructions (Signed)
Please schedule a follow-up appointment with your primary care doctor in the next few days to get your potassium rechecked.  Please return to your previous potassium prescription dose of taking 2 pills 3 times a day.  Please also take your nighttime dose of potassium when you get home today.

## 2020-03-14 NOTE — Telephone Encounter (Signed)
Patient was scheduled for ov today at 10:40

## 2020-03-14 NOTE — ED Provider Notes (Signed)
[facility] Provider Note   CSN: [id number] Arrival date & time: 03/14/20  1500     History Chief Complaint  Patient presents with  . Abnormal labs    Nicole Quinn is a 45 y.o. female with a past medical history of hypertension, who presents today for evaluation of hypokalemia.  She went to her PCPs office this morning as she was feeling fatigued, nauseous, lightheaded.  She states that over the past 3 years she has had issues with her potassium being low when no one has been able to give her an answer why.  She reports that about a month ago her prescription had been reduced from 49meq TID to 40 meq once a day.   She started feeling poorly 3 days ago.  She states that when she feels this way she knows her potassium is low.  She reports she is not on spironolactone or any blood pressure medications currently.  HPI     Past Medical History:  Diagnosis Date  . Allergy   . Anemia   . Endometriosis   . History of echocardiogram    Echo 6/18: EF 60-65, no RWMA, normal diastolic function  . History of nuclear stress test    Myoview 6/18: EF 60, normal perfusion, Low Risk  . History of UTI   . Hypertension    pregnancy induced HTN/pre-eclampsia //  on meds since 2nd child (2000)    Patient Active Problem List   Diagnosis Date Noted  . Dysuria 02/13/2018  . Arthralgia 02/13/2018  . Angioedema 06/26/2016  . Seasonal and perennial allergic rhinitis 06/26/2016  . Allergic conjunctivitis 06/26/2016  . Food allergy 06/26/2016  . Allergic urticaria 06/26/2016  . Depression 01/29/2016  . Left wrist pain 11/29/2015  . Obesity (BMI 30.0-34.9) 08/12/2013  . Other and unspecified hyperlipidemia 02/24/2013  . Chronic constipation 02/22/2013  . Routine general medical examination at a health care facility 02/22/2013  . Anxiety associated with depression 10/10/2011  . Dyspnea 08/24/2011  . OTHER ACNE 06/04/2009  . HYPERGLYCEMIA, BORDERLINE 06/04/2009  .  Essential hypertension 04/10/2009    Past Surgical History:  Procedure Laterality Date  . ABDOMINAL HYSTERECTOMY  2003   partial  . BREAST BIOPSY Left   . CARPAL TUNNEL RELEASE     right  . GANGLION CYST EXCISION     right  . Hammer Toe Repair Bilateral 12/29/2013   Lt #3, #4, Rt #2.     OB History   No obstetric history on file.     Family History  Problem Relation Age of Onset  . Allergies Daughter   . Allergic rhinitis Daughter   . Allergies Son   . Allergic rhinitis Son   . Leukemia Paternal Grandmother   . Arthritis Mother   . Hyperlipidemia Mother   . Hypertension Mother   . Stroke Mother   . Allergic rhinitis Mother   . Hypertension Father   . Diabetes Father   . Arthritis Maternal Grandmother   . Stroke Maternal Grandmother 50  . Hypertension Maternal Grandmother   . Heart attack Maternal Grandmother 50  . Coronary artery disease Maternal Aunt 50  . Coronary artery disease Maternal Aunt 50  . Angioedema Neg Hx   . Asthma Neg Hx   . Eczema Neg Hx   . Immunodeficiency Neg Hx   . Urticaria Neg Hx     Social History   Tobacco Use  . Smoking status: Never Smoker  . Smokeless tobacco: Never Used  Vaping Use  . Vaping Use: Never used  Substance Use Topics  . Alcohol use: No    Alcohol/week: 0.0 standard drinks  . Drug use: No    Home Medications Prior to Admission medications   Medication Sig Start Date End Date Taking? Authorizing Provider  Multiple Vitamin (MULTIVITAMIN) tablet Take 1 tablet by mouth daily.    [provider]  potassium chloride SA (KLOR-CON) 20 MEQ tablet Take 2 tablets (40 mEq total) by mouth 3 (three) times daily. 03/14/20   Debbrah Alar, NP  spironolactone (ALDACTONE) 50 MG tablet Take 1 tablet (50 mg total) by mouth daily. 02/16/19   Debbrah Alar, NP    Allergies    Amoxicillin and Ace inhibitors  Review of Systems   Review of Systems  Constitutional: Positive for fatigue. Negative for chills and  fever.  Eyes: Negative for visual disturbance.  Respiratory: Negative for cough and shortness of breath.   Cardiovascular: Negative for chest pain.  Gastrointestinal: Positive for nausea. Negative for abdominal pain.  Skin: Negative for color change and rash.  Neurological: Positive for light-headedness.  Psychiatric/Behavioral: Negative for confusion.  All other systems reviewed and are negative.   Physical Exam Updated Vital Signs BP 96/63   Pulse 81   Temp 98.4 F (36.9 C) (Oral)   Resp (!) 21   Ht 5\' 4"  (1.626 m)   Wt 74.8 kg   SpO2 100%   BMI 28.32 kg/m   Physical Exam Vitals and nursing note reviewed.  Constitutional:      General: She is not in acute distress.    Appearance: She is not diaphoretic.  HENT:     Head: Normocephalic and atraumatic.  Eyes:     General: No scleral icterus.       Right eye: No discharge.        Left eye: No discharge.     Conjunctiva/sclera: Conjunctivae normal.  Cardiovascular:     Rate and Rhythm: Normal rate and regular rhythm.     Pulses: Normal pulses.     Heart sounds: Normal heart sounds.  Pulmonary:     Effort: Pulmonary effort is normal. No respiratory distress.     Breath sounds: No stridor.  Abdominal:     General: There is no distension.  Musculoskeletal:        General: No deformity.     Cervical back: Normal range of motion.     Right lower leg: No edema.     Left lower leg: No edema.  Skin:    General: Skin is warm and dry.  Neurological:     Mental Status: She is alert.     Motor: No abnormal muscle tone.     Comments: Patient is awake and alert, answers questions appropriately without difficulty.  Psychiatric:        Behavior: Behavior normal.     ED Results / Procedures / Treatments   Labs (all labs ordered are listed, but only abnormal results are displayed) Labs Reviewed  POTASSIUM - Abnormal; Notable for the following components:      Result Value   Potassium 2.0 (*)    All other components  within normal limits  SARS CORONAVIRUS 2 (TAT 6-24 HRS)  MAGNESIUM    EKG None  Radiology No results found.  Procedures Procedures   Medications Ordered in ED Medications  potassium chloride 10 mEq in 100 mL IVPB (10 mEq Intravenous New Bag/Given 03/14/20 1940)  potassium chloride SA (KLOR-CON) CR tablet 40 mEq (40  mEq Oral Given 03/14/20 1541)    ED Course  I have reviewed the triage vital signs and the nursing notes.  Pertinent labs & imaging results that were available during my care of the patient were reviewed by me and considered in my medical decision making (see chart for details).    MDM Rules/Calculators/A&P                         Patient is a 44 year old woman who presents today for evaluation of hypokalemia. Prior to arrival her PCP reordered her potassium and ordered spironolactone for her however her potassium today was 2.1. Here it is verified due to it being significantly abnormal and is 2. Her magnesium is normal. She is given 4 rounds of IV potassium in addition to 40 meq PO potassium.    She had labs obtained at PCP earlier today including CBC, CMP which are reviewed. CBC shows mild anemia with a hemoglobin of 11.4.  CMP shows her potassium is low at 2.1. Her creatinine is slightly elevated at 1.21 however her previous was 1.07 and this is not significantly off of that baseline.  Patient will be discharged. She felt better after getting p.o. and IV potassium. She is instructed to follow-up with her primary care doctor in the next few days for repeat potassium. We discussed that most likely her potassium may still be slightly low, given that this talk about a month to get this low it may take more than 1 to 2 days for it to return fully to normal. She does not have significant vomiting or nausea and I do not anticipate ongoing GI loss of potassium based on her symptoms and her report.   Note: Portions of this report may have been transcribed using voice  recognition software. Every effort was made to ensure accuracy; however, inadvertent computerized transcription errors may be present  Final Clinical Impression(s) / ED Diagnoses Final diagnoses:  Hypokalemia    Rx / DC Orders ED Discharge Orders    None       Ollen Gross 03/15/20 0945    Dorie Rank, MD 03/15/20 1028

## 2020-03-14 NOTE — ED Notes (Signed)
Patient ambulated to bathroom without incident

## 2020-03-14 NOTE — Patient Instructions (Signed)
Please complete lab work prior to leaving. We will let you know how your results turn out.

## 2020-03-14 NOTE — Progress Notes (Signed)
Subjective:    Patient ID: Nicole Quinn, female    DOB: Mar 16, 1975, 45 y.o.   MRN: [id number]  HPI  Patient presents today to discuss concerns about her potassium.   Reports some nausea/dizziness/occasional heart palpitations. She started to feel badly on Sunday. She reports good compliance with Kdur 40 mEQ TID, but that she ran out of her aldactone.  States that she feels the same as she did last time her potassium was low.  She also recently travelled outside of the country. Review of Systems See HPI  Past Medical History:  Diagnosis Date  . Allergy   . Anemia   . Endometriosis   . History of echocardiogram    Echo 6/18: EF 60-65, no RWMA, normal diastolic function  . History of nuclear stress test    Myoview 6/18: EF 60, normal perfusion, Low Risk  . History of UTI   . Hypertension    pregnancy induced HTN/pre-eclampsia //  on meds since 2nd child (2000)     Social History   Socioeconomic History  . Marital status: Married    Spouse name: Not on file  . Number of children: 2  . Years of education: Not on file  . 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 Smoking status: Never Smoker  . Smokeless tobacco: Never Used  Vaping Use  . Vaping Use: Never used  Substance and Sexual Activity  . Alcohol use: No    Alcohol/week: 0.0 standard drinks  . Drug use: No  . Sexual activity: Not on file  Other Topics Concern  . Not on file  Social History Narrative   Regular exercise: yes.   Married.  Lives with one child.  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        Social Determinants of Health   Financial Resource Strain: Not on file  Food Insecurity: Not on file  Transportation Needs: Not on file  Physical Activity: Not on file  Stress: Not on file  Social Connections: Not on file  Intimate Partner Violence: Not on file    Past  Surgical History:  Procedure Laterality Date  . ABDOMINAL HYSTERECTOMY  2003   partial  . BREAST BIOPSY Left   . CARPAL TUNNEL RELEASE     right  . GANGLION CYST EXCISION     right  . Hammer Toe Repair Bilateral 12/29/2013   Lt #3, #4, Rt #2.    Family History  Problem Relation Age of Onset  . Allergies Daughter   . Allergic rhinitis Daughter   . Allergies Son   . Allergic rhinitis Son   . Leukemia Paternal Grandmother   . Arthritis Mother   . Hyperlipidemia Mother   . Hypertension Mother   . Stroke Mother   . Allergic rhinitis Mother   . Hypertension Father   . Diabetes Father   . Arthritis Maternal Grandmother   . Stroke Maternal Grandmother 50  . Hypertension Maternal Grandmother   . Heart attack Maternal Grandmother 50  . Coronary artery disease Maternal Aunt 50  . Coronary artery disease Maternal Aunt 50  . Angioedema Neg Hx   . Asthma Neg Hx   . Eczema Neg Hx   . Immunodeficiency Neg Hx   . Urticaria Neg Hx     Allergies  Allergen Reactions  . Amoxicillin     rash  .  Ace Inhibitors     REACTION: lip swelling    Current Outpatient Medications on File Prior to Visit  Medication Sig Dispense Refill  . Multiple Vitamin (MULTIVITAMIN) tablet Take 1 tablet by mouth daily.    . potassium chloride SA (KLOR-CON) 20 MEQ tablet Take 1 tablet (20 mEq total) by mouth daily. 30 tablet 1  . spironolactone (ALDACTONE) 50 MG tablet Take 1 tablet (50 mg total) by mouth daily. 30 tablet 2   No current facility-administered medications on file prior to visit.    BP 116/77 (BP Location: Right Arm, Patient Position: Sitting, Cuff Size: Small)   Pulse 92   Temp 98.6 F (37 C) (Oral)   Resp 16   Wt 167 lb 12.8 oz (76.1 kg)   SpO2 99%   BMI 28.80 kg/m       Objective:   Physical Exam Constitutional:      Appearance: She is well-developed and well-nourished. She is ill-appearing.     Comments: Weak appearing  Neck:     Thyroid: No thyromegaly.  Cardiovascular:      Rate and Rhythm: Normal rate and regular rhythm.     Heart sounds: Normal heart sounds. No murmur heard.   Pulmonary:     Effort: Pulmonary effort is normal. No respiratory distress.     Breath sounds: Normal breath sounds. No wheezing.  Musculoskeletal:     Cervical back: Neck supple.  Skin:    General: Skin is warm and dry.  Neurological:     Mental Status: She is alert and oriented to person, place, and time.  Psychiatric:        Mood and Affect: Mood and affect normal.        Behavior: Behavior normal.        Thought Content: Thought content normal.        Judgment: Judgment normal.           Assessment & Plan:  Severe hypokalemia- Stat lab was sent and K+ returned at 2.1.  Phone pt and advised her to have her husband take her directly to the ED now.  I did swab her for covid-19 today as well due to her symptoms.   Report was given to Dr. Maryan Rued EDP at [facility].    She confirms that she has been taking Kdur 40 meq TID.  Rx was sent over yesterday evening for 1 tab daily in error. This has been corrected and  I advised her to continue the 47meq TID and not to decrease. Pt verbalizes understanding.  Palpitations- likely secondary to hypokalemia. EKG tracing is personally reviewed.  EKG notes NSR.  No acute changes. Some nonspecific ST depressions noted.    This visit occurred during the SARS-CoV-2 public health emergency.  Safety protocols were in place, including screening questions prior to the visit, additional usage of staff PPE, and extensive cleaning of exam room while observing appropriate contact time as indicated for disinfecting solutions.

## 2020-03-14 NOTE — Telephone Encounter (Signed)
CRITICAL VALUE STICKER  CRITICAL VALUE: Potassium 2.1  RECEIVER (on-site recipient of call): Charisma  DATE & TIME NOTIFIED: 03/14/2020 at 12:55 pm  MESSENGER (representative from lab): Si  MD NOTIFIED: Debbrah Alar, NP  TIME OF NOTIFICATION: 12:58 pm  RESPONSE:

## 2020-03-15 LAB — SARS CORONAVIRUS 2 (TAT 6-24 HRS): SARS Coronavirus 2: NEGATIVE

## 2020-03-15 LAB — NOVEL CORONAVIRUS, NAA: SARS-CoV-2, NAA: NOT DETECTED

## 2020-03-15 LAB — SARS-COV-2, NAA 2 DAY TAT

## 2020-03-20 ENCOUNTER — Ambulatory Visit (INDEPENDENT_AMBULATORY_CARE_PROVIDER_SITE_OTHER): Payer: BC Managed Care – PPO | Admitting: Family

## 2020-03-20 ENCOUNTER — Other Ambulatory Visit: Payer: Self-pay

## 2020-03-20 ENCOUNTER — Telehealth: Payer: Self-pay | Admitting: Family

## 2020-03-20 ENCOUNTER — Encounter: Payer: Self-pay | Admitting: Family

## 2020-03-20 VITALS — BP 122/86 | HR 94 | Temp 97.6°F | Resp 16 | Ht 64.0 in | Wt 165.0 lb

## 2020-03-20 DIAGNOSIS — Z111 Encounter for screening for respiratory tuberculosis: Secondary | ICD-10-CM | POA: Diagnosis not present

## 2020-03-20 DIAGNOSIS — Z1159 Encounter for screening for other viral diseases: Secondary | ICD-10-CM | POA: Diagnosis not present

## 2020-03-20 DIAGNOSIS — Z23 Encounter for immunization: Secondary | ICD-10-CM

## 2020-03-20 DIAGNOSIS — Z0000 Encounter for general adult medical examination without abnormal findings: Secondary | ICD-10-CM

## 2020-03-20 DIAGNOSIS — E785 Hyperlipidemia, unspecified: Secondary | ICD-10-CM

## 2020-03-20 DIAGNOSIS — E876 Hypokalemia: Secondary | ICD-10-CM

## 2020-03-20 LAB — BASIC METABOLIC PANEL
BUN: 17 mg/dL (ref 6–23)
CO2: 37 mEq/L — ABNORMAL HIGH (ref 19–32)
Calcium: 10.2 mg/dL (ref 8.4–10.5)
Chloride: 93 mEq/L — ABNORMAL LOW (ref 96–112)
Creatinine, Ser: 0.96 mg/dL (ref 0.40–1.20)
GFR: 72.7 mL/min (ref >60.00)
Glucose, Bld: 87 mg/dL (ref 70–99)
Potassium: 3.1 mEq/L — ABNORMAL LOW (ref 3.5–5.1)
Sodium: 139 mEq/L (ref 135–145)

## 2020-03-20 LAB — HEPATIC FUNCTION PANEL
ALT: 13 U/L (ref 0–35)
AST: 14 U/L (ref 0–37)
Albumin: 4.5 g/dL (ref 3.5–5.2)
Alkaline Phosphatase: 81 U/L (ref 39–117)
Bilirubin, Direct: 0.1 mg/dL (ref 0.0–0.3)
Total Bilirubin: 0.4 mg/dL (ref 0.2–1.2)
Total Protein: 7.9 g/dL (ref 6.0–8.3)

## 2020-03-20 LAB — LIPID PANEL
Cholesterol: 249 mg/dL — ABNORMAL HIGH (ref 0–200)
HDL: 61 mg/dL (ref >39.00)
LDL Cholesterol: 174 mg/dL — ABNORMAL HIGH (ref 0–99)
NonHDL: 187.94
Total CHOL/HDL Ratio: 4
Triglycerides: 68 mg/dL (ref 0.0–149.0)
VLDL: 13.6 mg/dL (ref 0.0–40.0)

## 2020-03-20 LAB — MAGNESIUM: Magnesium: 1.9 mg/dL (ref 1.5–2.5)

## 2020-03-20 MED ORDER — SPIRONOLACTONE 50 MG PO TABS: 50.0000 mg | ORAL_TABLET | Freq: Every day | ORAL | 2 refills | Status: DC

## 2020-03-20 NOTE — Progress Notes (Signed)
Subjective:    Patient ID: Nicole Quinn, female    DOB: 02/22/1975, 45 y.o.   MRN: [id number]  HPI  Patient is a 45 yr old female who presents today for ER follow up of her severe hypokalemia. She is on Kdur 63mEQ TID. She is not currently taking aldactone.  ED record is reviewed.  She notes that she is definitely feeling better but still "tired."    Immunizations: flu due, pfizer x 2, tetanus up to date Diet: working on weight loss Wt Readings from Last 3 Encounters:  03/20/20 165 lb (74.8 kg)  03/14/20 165 lb (74.8 kg)  03/14/20 167 lb 12.8 oz (76.1 kg)  Exercise:yes Pap Smear:n/a Mammogram: due Vision: due Dental: due     Review of Systems  Constitutional: Negative for unexpected weight change.  HENT: Negative for rhinorrhea.   Respiratory: Negative for shortness of breath.   Cardiovascular: Negative for chest pain.  Gastrointestinal: Positive for constipation (chronic). Negative for diarrhea.  Genitourinary: Negative for dysuria, frequency and hematuria.  Musculoskeletal: Negative for arthralgias and myalgias.  Skin: Negative for rash.  Neurological: Negative for headaches.  Hematological: Negative for adenopathy.  Psychiatric/Behavioral:       Denies depression/anxiety   Past Medical History:  Diagnosis Date  . Allergy   . Anemia   . Endometriosis   . History of echocardiogram    Echo 6/18: EF 60-65, no RWMA, normal diastolic function  . History of nuclear stress test    Myoview 6/18: EF 60, normal perfusion, Low Risk  . History of UTI   . Hypertension    pregnancy induced HTN/pre-eclampsia //  on meds since 2nd child (2000)     Social History   Socioeconomic History  . Marital status: Married    Spouse name: Not on file  . Number of children: 2  . Years of education: Not on file  . 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 Smoking status: Never Smoker  . Smokeless tobacco: Never Used  Vaping Use  . Vaping Use: Never used  Substance and Sexual Activity  . Alcohol use: No    Alcohol/week: 0.0 standard drinks  . Drug use: No  . Sexual activity: Not on file  Other Topics Concern  . Not on file  Social History Narrative   Regular exercise: yes.   Married.  Lives with one child.  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        Social Determinants of Health   Financial Resource Strain: Not on file  Food Insecurity: Not on file  Transportation Needs: Not on file  Physical Activity: Not on file  Stress: Not on file  Social Connections: Not on file  Intimate Partner Violence: Not on file    Past Surgical History:  Procedure Laterality Date  . ABDOMINAL HYSTERECTOMY  2003   partial  . BREAST BIOPSY Left   . CARPAL TUNNEL RELEASE     right  . GANGLION CYST EXCISION     right  . Hammer Toe Repair Bilateral 12/29/2013   Lt #3, #4, Rt #2.    Family History  Problem Relation Age of Onset  . Allergies Daughter   . Allergic rhinitis Daughter   . Allergies Son   . Allergic rhinitis Son   . Leukemia Paternal Grandmother   . Arthritis Mother   . Hyperlipidemia Mother   .  Hypertension Mother   . Stroke Mother   . Allergic rhinitis Mother   . Hypertension Father   . Diabetes Father   . Arthritis Maternal Grandmother   . Stroke Maternal Grandmother 50  . Hypertension Maternal Grandmother   . Heart attack Maternal Grandmother 50  . Coronary artery disease Maternal Aunt 50  . Coronary artery disease Maternal Aunt 50  . Angioedema Neg Hx   . Asthma Neg Hx   . Eczema Neg Hx   . Immunodeficiency Neg Hx   . Urticaria Neg Hx     Allergies  Allergen Reactions  . Amoxicillin     rash  . Ace Inhibitors     REACTION: lip swelling    Current Outpatient Medications on File Prior to Visit  Medication Sig Dispense Refill  . Multiple Vitamin (MULTIVITAMIN) tablet Take 1 tablet by mouth daily.     . potassium chloride SA (KLOR-CON) 20 MEQ tablet Take 2 tablets (40 mEq total) by mouth 3 (three) times daily. 180 tablet 5   No current facility-administered medications on file prior to visit.    BP 122/86 (BP Location: Right Arm, Patient Position: Sitting, Cuff Size: Normal)   Pulse 94   Temp 97.6 F (36.4 C)   Resp 16   Ht 5\' 4"  (1.626 m)   Wt 165 lb (74.8 kg)   SpO2 97%   BMI 28.32 kg/m       Objective:   Physical Exam  Physical Exam  Constitutional: She is oriented to person, place, and time. She appears well-developed and well-nourished. No distress.  HENT:  Head: Normocephalic and atraumatic.  Right Ear: Tympanic membrane and ear canal normal.  Left Ear: Tympanic membrane and ear canal normal.  Mouth/Throat: not examined- pt wearing mask Eyes: Pupils are equal, round, and reactive to light. No scleral icterus.  Neck: Normal range of motion. No thyromegaly present.  Cardiovascular: Normal rate and regular rhythm.   No murmur heard. Pulmonary/Chest: Effort normal and breath sounds normal. No respiratory distress. He has no wheezes. She has no rales. She exhibits no tenderness.  Abdominal: Soft. Bowel sounds are normal. She exhibits no distension and no mass. There is no tenderness. There is no rebound and no guarding.  Musculoskeletal: She exhibits no edema.  Lymphadenopathy:    She has no cervical adenopathy.  Neurological: She is alert and oriented to person, place, and time. She has normal patellar reflexes. She exhibits normal muscle tone. Coordination normal.  Skin: Skin is warm and dry.  Psychiatric: She has a normal mood and affect. Her behavior is normal. Judgment and thought content normal.  Breast/pelvic: deferred           Assessment & Plan:   Preventative care- flu shot today.  She is reminded to get a 3rd covid shot 6 months after the 2nd.  mammo is due.  Order placed.  Encouraged her to continue healthy diet, exercise.  Recommended that she  schedule routine dental and eye exams. Requesting TB test for employer.   Hypokalemia- Repeat K+ and Magnesium today. She is clinically improving, but still notes some fatigue.  She is not taking aldactone because the pharmacy stated that they "didn't get it." My records show that it was sent in the end of January with refills. Will resend. I advised pt to restart the aldactone and continue Kdur.  Also, I would like to get her back in with nephrology for further evaluation.  This visit occurred during the SARS-CoV-2 public health emergency.  Safety protocols were in place, including screening questions prior to the visit, additional usage of staff PPE, and extensive cleaning of exam room while observing appropriate contact time as indicated for disinfecting solutions.         Assessment & Plan:

## 2020-03-20 NOTE — Telephone Encounter (Signed)
Patient aware of results and instructions. She was scheduled for 03-27-20 for bmet

## 2020-03-20 NOTE — Telephone Encounter (Signed)
Potassium is improving (3.1). I would like her to increase her kdur to 56meQ 4 times daily today (then return to 3 times daily tomorrow). Start aldactone. Repeat bmet in 1 week, dx hypokalemia.

## 2020-03-21 LAB — HEPATITIS C ANTIBODY
Hepatitis C Ab: NONREACTIVE
SIGNAL TO CUT-OFF: 0.01 (ref <1.00)

## 2020-03-21 NOTE — Addendum Note (Signed)
Addended by: Debbrah Alar on: 03/21/2020 10:25 AM   Modules accepted: Level of Service

## 2020-03-22 LAB — QUANTIFERON-TB GOLD PLUS
Mitogen-NIL: >10 IU/mL
NIL: 0.04 IU/mL
QuantiFERON-TB Gold Plus: NEGATIVE
TB1-NIL: <0 IU/mL
TB2-NIL: <0 IU/mL

## 2020-03-27 ENCOUNTER — Encounter: Payer: Self-pay | Admitting: Family

## 2020-03-27 ENCOUNTER — Telehealth: Payer: Self-pay | Admitting: Family

## 2020-03-27 ENCOUNTER — Other Ambulatory Visit: Payer: Self-pay

## 2020-03-27 ENCOUNTER — Other Ambulatory Visit (INDEPENDENT_AMBULATORY_CARE_PROVIDER_SITE_OTHER): Payer: BC Managed Care – PPO

## 2020-03-27 DIAGNOSIS — E876 Hypokalemia: Secondary | ICD-10-CM | POA: Diagnosis not present

## 2020-03-27 LAB — BASIC METABOLIC PANEL
BUN: 16 mg/dL (ref 6–23)
CO2: 33 mEq/L — ABNORMAL HIGH (ref 19–32)
Calcium: 10.4 mg/dL (ref 8.4–10.5)
Chloride: 91 mEq/L — ABNORMAL LOW (ref 96–112)
Creatinine, Ser: 1.11 mg/dL (ref 0.40–1.20)
GFR: 61.07 mL/min (ref >60.00)
Glucose, Bld: 113 mg/dL — ABNORMAL HIGH (ref 70–99)
Potassium: 2.9 mEq/L — ABNORMAL LOW (ref 3.5–5.1)
Sodium: 137 mEq/L (ref 135–145)

## 2020-03-27 MED ORDER — POTASSIUM CHLORIDE CRYS ER 20 MEQ PO TBCR: 40.0000 meq | EXTENDED_RELEASE_TABLET | Freq: Four times a day (QID) | ORAL | 5 refills | Status: DC

## 2020-03-27 NOTE — Telephone Encounter (Signed)
Reviewed K+ 2.9 with pt. She reports compliance with kdur 75mEQ TID and aldactone 50mg  daily. Reports that she has fatigue.  She has not yet heard from the Nephrologist re: consult/follow up.  I gave her the number to call their office. In the meantime, I will increase her Kdur to 40 mEq QID and have her return to the lab in 1 week for repeat bmet.  Pt verbalizes understanding and lab appointment has been scheduled.

## 2020-03-30 DIAGNOSIS — E876 Hypokalemia: Secondary | ICD-10-CM | POA: Diagnosis not present

## 2020-03-30 DIAGNOSIS — F419 Anxiety disorder, unspecified: Secondary | ICD-10-CM | POA: Diagnosis not present

## 2020-03-30 DIAGNOSIS — I10 Essential (primary) hypertension: Secondary | ICD-10-CM | POA: Diagnosis not present

## 2020-04-03 ENCOUNTER — Other Ambulatory Visit: Payer: BC Managed Care – PPO

## 2020-04-13 ENCOUNTER — Other Ambulatory Visit: Payer: Self-pay

## 2020-04-13 ENCOUNTER — Other Ambulatory Visit (INDEPENDENT_AMBULATORY_CARE_PROVIDER_SITE_OTHER): Payer: BC Managed Care – PPO

## 2020-04-13 DIAGNOSIS — E876 Hypokalemia: Secondary | ICD-10-CM | POA: Diagnosis not present

## 2020-04-13 LAB — BASIC METABOLIC PANEL
BUN: 16 mg/dL (ref 6–23)
CO2: 30 mEq/L (ref 19–32)
Calcium: 9.7 mg/dL (ref 8.4–10.5)
Chloride: 98 mEq/L (ref 96–112)
Creatinine, Ser: 0.95 mg/dL (ref 0.40–1.20)
GFR: 73.59 mL/min (ref >60.00)
Glucose, Bld: 96 mg/dL (ref 70–99)
Potassium: 3.5 mEq/L (ref 3.5–5.1)
Sodium: 137 mEq/L (ref 135–145)

## 2020-05-15 ENCOUNTER — Other Ambulatory Visit: Payer: Self-pay | Admitting: *Deleted

## 2020-05-15 MED ORDER — SPIRONOLACTONE 50 MG PO TABS: 50.0000 mg | ORAL_TABLET | Freq: Every day | ORAL | 2 refills | Status: DC

## 2020-05-24 ENCOUNTER — Encounter: Payer: Self-pay | Admitting: Family

## 2020-05-25 MED ORDER — METRONIDAZOLE 500 MG PO TABS: 500.0000 mg | ORAL_TABLET | Freq: Two times a day (BID) | ORAL | 0 refills | Status: AC

## 2020-06-08 ENCOUNTER — Ambulatory Visit: Payer: BC Managed Care – PPO

## 2020-07-18 ENCOUNTER — Other Ambulatory Visit: Payer: Self-pay | Admitting: Family

## 2020-07-18 DIAGNOSIS — Z1231 Encounter for screening mammogram for malignant neoplasm of breast: Secondary | ICD-10-CM

## 2020-07-24 ENCOUNTER — Encounter: Payer: Self-pay | Admitting: Family

## 2020-08-21 ENCOUNTER — Ambulatory Visit: Payer: BC Managed Care – PPO | Admitting: Family

## 2020-09-11 ENCOUNTER — Other Ambulatory Visit: Payer: Self-pay

## 2020-09-11 ENCOUNTER — Ambulatory Visit
Admission: RE | Admit: 2020-09-11 | Discharge: 2020-09-11 | Disposition: A | Discharge status: Home / Self Care | Payer: BC Managed Care – PPO | Source: Ambulatory Visit | Attending: Family | Admitting: Family

## 2020-09-11 DIAGNOSIS — Z1231 Encounter for screening mammogram for malignant neoplasm of breast: Secondary | ICD-10-CM | POA: Diagnosis not present

## 2020-09-12 ENCOUNTER — Telehealth: Payer: Self-pay | Admitting: Family

## 2020-09-12 NOTE — Telephone Encounter (Signed)
Please let pt know that the radiologist would like her to complete some additional breast images for further evaluation. Let me know if she has not been contacted by them about a follow up appointment in 1 week.   

## 2020-09-13 NOTE — Telephone Encounter (Signed)
Patient advised.

## 2020-09-14 ENCOUNTER — Other Ambulatory Visit: Payer: Self-pay | Admitting: Family

## 2020-09-14 DIAGNOSIS — R928 Other abnormal and inconclusive findings on diagnostic imaging of breast: Secondary | ICD-10-CM

## 2020-09-21 ENCOUNTER — Ambulatory Visit: Payer: BC Managed Care – PPO | Admitting: Family

## 2020-09-21 ENCOUNTER — Other Ambulatory Visit: Payer: Self-pay

## 2020-09-21 VITALS — BP 124/70 | HR 74 | Temp 97.4°F | Resp 17 | Ht 64.0 in | Wt 180.0 lb

## 2020-09-21 DIAGNOSIS — R52 Pain, unspecified: Secondary | ICD-10-CM | POA: Diagnosis not present

## 2020-09-21 DIAGNOSIS — Z23 Encounter for immunization: Secondary | ICD-10-CM | POA: Diagnosis not present

## 2020-09-21 LAB — COMPREHENSIVE METABOLIC PANEL
ALT: 11 U/L (ref 0–35)
AST: 14 U/L (ref 0–37)
Albumin: 4.2 g/dL (ref 3.5–5.2)
Alkaline Phosphatase: 65 U/L (ref 39–117)
BUN: 15 mg/dL (ref 6–23)
CO2: 30 mEq/L (ref 19–32)
Calcium: 9.5 mg/dL (ref 8.4–10.5)
Chloride: 100 mEq/L (ref 96–112)
Creatinine, Ser: 0.89 mg/dL (ref 0.40–1.20)
GFR: 79.33 mL/min (ref >60.00)
Glucose, Bld: 79 mg/dL (ref 70–99)
Potassium: 3.6 mEq/L (ref 3.5–5.1)
Sodium: 139 mEq/L (ref 135–145)
Total Bilirubin: 0.4 mg/dL (ref 0.2–1.2)
Total Protein: 7.2 g/dL (ref 6.0–8.3)

## 2020-09-21 LAB — CBC WITH DIFFERENTIAL/PLATELET
Basophils Absolute: 0 10*3/uL (ref 0.0–0.1)
Basophils Relative: 0.8 % (ref 0.0–3.0)
Eosinophils Absolute: 0.1 10*3/uL (ref 0.0–0.7)
Eosinophils Relative: 1.2 % (ref 0.0–5.0)
HCT: 32.5 % — ABNORMAL LOW (ref 36.0–46.0)
Hemoglobin: 11.1 g/dL — ABNORMAL LOW (ref 12.0–15.0)
Lymphocytes Relative: 46.3 % — ABNORMAL HIGH (ref 12.0–46.0)
Lymphs Abs: 2.3 10*3/uL (ref 0.7–4.0)
MCHC: 34.3 g/dL (ref 30.0–36.0)
MCV: 91.8 fl (ref 78.0–100.0)
Monocytes Absolute: 0.3 10*3/uL (ref 0.1–1.0)
Monocytes Relative: 6.4 % (ref 3.0–12.0)
Neutro Abs: 2.3 10*3/uL (ref 1.4–7.7)
Neutrophils Relative %: 45.3 % (ref 43.0–77.0)
Platelets: 334 10*3/uL (ref 150.0–400.0)
RBC: 3.54 Mil/uL — ABNORMAL LOW (ref 3.87–5.11)
RDW: 12.6 % (ref 11.5–15.5)
WBC: 5 10*3/uL (ref 4.0–10.5)

## 2020-09-21 LAB — TSH: TSH: 0.65 u[IU]/mL (ref 0.35–5.50)

## 2020-09-21 LAB — SEDIMENTATION RATE: Sed Rate: 49 mm/hr — ABNORMAL HIGH (ref 0–20)

## 2020-09-21 LAB — CK: Total CK: 61 U/L (ref 7–177)

## 2020-09-21 NOTE — Progress Notes (Signed)
Subjective:   By signing my name below, I, Nicole Quinn, attest that this documentation has been prepared under the direction and in the presence of Nicole Quinn. 09/21/2020   Patient ID: Nicole Quinn, female    DOB: 1976/01/14, 45 y.o.   MRN: [id number]  Chief Complaint  Patient presents with   Lab Work   Fatigue    Extreme fatigue, body feels like its aching, cold often     HPI Patient is in today for an office visit.  Muscle pain- She complains of generalized muscle aches for the past month. She is getting massages at a chiropractor office and reports no change in her symptoms. She gets on average 6-7 hours of sleep a day. She continues taking potassium supplements and 50 mg aldactone daily PO at this time. She has had anemia in the past. She denies having any joint or leg swelling at this time. She is managing her stress well at this time.  Immunizations- She is interested in getting a flu vaccine during this visit.  There are no preventive care reminders to display for this patient.   Past Medical History:  Diagnosis Date   Allergy    Anemia    Endometriosis    History of echocardiogram    Echo 6/18: EF 60-65, no RWMA, normal diastolic function   History of nuclear stress test    Myoview 6/18: EF 60, normal perfusion, Low Risk   History of UTI    Hypertension    pregnancy induced HTN/pre-eclampsia //  on meds since 2nd child (2000)    Past Surgical History:  Procedure Laterality Date   ABDOMINAL HYSTERECTOMY  2003   partial   BREAST BIOPSY Left 10/01/2017   CARPAL TUNNEL RELEASE     right   GANGLION CYST EXCISION     right   Hammer Toe Repair Bilateral 12/29/2013   Lt #3, #4, Rt #2.    Family History  Problem Relation Age of Onset   Allergies Daughter    Allergic rhinitis Daughter    Allergies Son    Allergic rhinitis Son    Leukemia Paternal Grandmother    Arthritis Mother    Hyperlipidemia Mother    Hypertension Mother    Stroke Mother     Allergic rhinitis Mother    Hypertension Father    Diabetes Father    Arthritis Maternal Grandmother    Stroke Maternal Grandmother 49   Hypertension Maternal Grandmother    Heart attack Maternal Grandmother 28   Coronary artery disease Maternal Aunt 50   Coronary artery disease Maternal Aunt 50   Angioedema Neg Hx    Asthma Neg Hx    Eczema Neg Hx    Immunodeficiency Neg Hx    Urticaria Neg Hx     Social History   Socioeconomic History   Marital status: Married    Spouse name: Not on file   Number of children: 2   Years of education: Not on file   Highest education level: Not on file  Occupational History    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   Married.  Lives with one child.  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        Social Determinants of Health   Financial Resource Strain: Not on file  Food Insecurity: Not on file  Transportation Needs: Not on file  Physical Activity: Not on file  Stress: Not on file  Social Connections: Not on file  Intimate Partner Violence: Not on file    Outpatient Medications Prior to Visit  Medication Sig Dispense Refill   Multiple Vitamin (MULTIVITAMIN) tablet Take 1 tablet by mouth daily.     potassium chloride SA (KLOR-CON) 20 MEQ tablet Take 2 tablets (40 mEq total) by mouth in the morning, at noon, in the evening, and at bedtime. 240 tablet 5   spironolactone (ALDACTONE) 50 MG tablet Take 1 tablet (50 mg total) by mouth daily. 30 tablet 2   No facility-administered medications prior to visit.    Allergies  Allergen Reactions   Amoxicillin      rash   Ace Inhibitors     REACTION: lip swelling    Review of Systems  Cardiovascular:  Negative for leg swelling.  Musculoskeletal:  Positive for myalgias (Generalized).      Objective:    Physical Exam Constitutional:      General: She is not in acute distress.    Appearance: Normal appearance. She is not ill-appearing.  HENT:     Head: Normocephalic and atraumatic.     Right Ear: External ear normal.     Left Ear: External ear normal.  Eyes:     Extraocular Movements: Extraocular movements intact.     Pupils: Pupils are equal, round, and reactive to light.  Cardiovascular:     Rate and Rhythm: Normal rate and regular rhythm.     Heart sounds: Normal heart sounds. No murmur heard.   No gallop.  Pulmonary:     Effort: Pulmonary effort is normal. No respiratory distress.     Breath sounds: Normal breath sounds. No wheezing or rales.  Skin:    General: Skin is warm and dry.  Neurological:     Mental Status: She is alert and oriented to person, place, and time.  Psychiatric:        Behavior: Behavior normal.        Judgment: Judgment normal.    BP 124/70 (BP Location: Left Arm, Patient Position: Sitting, Cuff Size: Normal)   Pulse 74   Temp (!) 97.4 F (36.3 C)   Resp 17   Ht $R'5\' 4"'lK$  (1.626 m)   Wt 180 lb (81.6 kg)   SpO2 99%   BMI 30.90 kg/m  Wt Readings from Last 3 Encounters:  09/21/20 180 lb (81.6 kg)  03/20/20 165 lb (74.8 kg)  03/14/20 165 lb (74.8 kg)       Assessment & Plan:   Problem List Items Addressed This Visit       Unprioritized   Body aches - Primary    New. Etiology is unclear. Will order labs as below for further evaluation. Plan follow up in 1 month.   Flu shot today.       Relevant Orders   Comp Met (CMET)   CBC with Differential/Platelet   TSH   B. burgdorfi antibodies   Rocky mtn spotted fvr abs pnl(IgG+IgM)   ANA   Sedimentation rate   Rheumatoid Factor   CK (Creatine Kinase)   Other Visit Diagnoses     Needs  flu  shot       Relevant Orders   Flu Vaccine QUAD 6+ mos PF IM (Fluarix Quad PF) (Completed)         No orders of the defined types were placed in this encounter.   I, Nicole Quinn, personally preformed the services described in this documentation.  All medical record entries made by the scribe were at my direction and in my presence.  I have reviewed the chart and discharge instructions (if applicable) and agree that the record reflects my personal performance and is accurate and complete. 09/21/2020   I,Nicole Quinn,acting as a scribe for [name], Quinn.,have documented all relevant documentation on the behalf of [name], Quinn,as directed by  [name], Quinn while in the presence of [name], Quinn.    [name], Quinn

## 2020-09-21 NOTE — Assessment & Plan Note (Addendum)
New. Etiology is unclear. Will order labs as below for further evaluation. Plan follow up in 1 month.   Flu shot today.

## 2020-09-26 ENCOUNTER — Encounter: Payer: Self-pay | Admitting: Family

## 2020-10-01 ENCOUNTER — Ambulatory Visit
Admission: RE | Admit: 2020-10-01 | Discharge: 2020-10-01 | Disposition: A | Discharge status: Home / Self Care | Payer: BC Managed Care – PPO | Source: Ambulatory Visit | Attending: Family | Admitting: Family

## 2020-10-01 ENCOUNTER — Other Ambulatory Visit: Payer: Self-pay | Admitting: Family

## 2020-10-01 ENCOUNTER — Encounter: Payer: Self-pay | Admitting: Family

## 2020-10-01 ENCOUNTER — Other Ambulatory Visit: Payer: Self-pay

## 2020-10-01 DIAGNOSIS — R928 Other abnormal and inconclusive findings on diagnostic imaging of breast: Secondary | ICD-10-CM

## 2020-10-01 DIAGNOSIS — R922 Inconclusive mammogram: Secondary | ICD-10-CM | POA: Diagnosis not present

## 2020-10-01 LAB — ROCKY MTN SPOTTED FVR ABS PNL(IGG+IGM)
RMSF IgG: NOT DETECTED
RMSF IgM: NOT DETECTED

## 2020-10-01 LAB — ANA: Anti Nuclear Antibody (ANA): NEGATIVE

## 2020-10-01 LAB — RHEUMATOID FACTOR: Rheumatoid fact SerPl-aCnc: <14 IU/mL (ref <14)

## 2020-10-01 LAB — B. BURGDORFI ANTIBODIES: B burgdorferi Ab IgG+IgM: <0.9 index

## 2020-10-02 MED ORDER — METHOCARBAMOL 500 MG PO TABS: 500.0000 mg | ORAL_TABLET | Freq: Three times a day (TID) | ORAL | 0 refills | Status: DC | PRN

## 2020-10-05 ENCOUNTER — Ambulatory Visit: Admission: RE | Admit: 2020-10-05 | Payer: BC Managed Care – PPO | Source: Ambulatory Visit

## 2020-10-05 ENCOUNTER — Other Ambulatory Visit: Payer: Self-pay

## 2020-10-05 ENCOUNTER — Ambulatory Visit
Admission: RE | Admit: 2020-10-05 | Discharge: 2020-10-05 | Disposition: A | Discharge status: Home / Self Care | Payer: BC Managed Care – PPO | Source: Ambulatory Visit | Attending: Family | Admitting: Family

## 2020-10-05 DIAGNOSIS — R928 Other abnormal and inconclusive findings on diagnostic imaging of breast: Secondary | ICD-10-CM

## 2020-10-05 DIAGNOSIS — N6311 Unspecified lump in the right breast, upper outer quadrant: Secondary | ICD-10-CM | POA: Diagnosis not present

## 2020-10-05 DIAGNOSIS — D241 Benign neoplasm of right breast: Secondary | ICD-10-CM | POA: Diagnosis not present

## 2020-10-17 ENCOUNTER — Other Ambulatory Visit: Payer: Self-pay

## 2020-10-17 ENCOUNTER — Telehealth: Payer: Self-pay | Admitting: Family

## 2020-10-17 ENCOUNTER — Ambulatory Visit: Payer: BC Managed Care – PPO | Admitting: Family

## 2020-10-17 VITALS — BP 103/81 | HR 70 | Temp 97.8°F | Resp 18 | Ht 64.0 in | Wt 173.8 lb

## 2020-10-17 DIAGNOSIS — R232 Flushing: Secondary | ICD-10-CM | POA: Insufficient documentation

## 2020-10-17 DIAGNOSIS — R5383 Other fatigue: Secondary | ICD-10-CM

## 2020-10-17 DIAGNOSIS — M2550 Pain in unspecified joint: Secondary | ICD-10-CM | POA: Diagnosis not present

## 2020-10-17 DIAGNOSIS — R109 Unspecified abdominal pain: Secondary | ICD-10-CM | POA: Insufficient documentation

## 2020-10-17 DIAGNOSIS — R7989 Other specified abnormal findings of blood chemistry: Secondary | ICD-10-CM

## 2020-10-17 DIAGNOSIS — R945 Abnormal results of liver function studies: Secondary | ICD-10-CM

## 2020-10-17 DIAGNOSIS — N179 Acute kidney failure, unspecified: Secondary | ICD-10-CM

## 2020-10-17 LAB — COMPREHENSIVE METABOLIC PANEL
ALT: 50 U/L — ABNORMAL HIGH (ref 0–35)
AST: 67 U/L — ABNORMAL HIGH (ref 0–37)
Albumin: 4.3 g/dL (ref 3.5–5.2)
Alkaline Phosphatase: 97 U/L (ref 39–117)
BUN: 22 mg/dL (ref 6–23)
CO2: 30 mEq/L (ref 19–32)
Calcium: 9.6 mg/dL (ref 8.4–10.5)
Chloride: 89 mEq/L — ABNORMAL LOW (ref 96–112)
Creatinine, Ser: 1.74 mg/dL — ABNORMAL HIGH (ref 0.40–1.20)
GFR: 35.47 mL/min — ABNORMAL LOW (ref >60.00)
Glucose, Bld: 91 mg/dL (ref 70–99)
Potassium: 3.3 mEq/L — ABNORMAL LOW (ref 3.5–5.1)
Sodium: 129 mEq/L — ABNORMAL LOW (ref 135–145)
Total Bilirubin: 0.4 mg/dL (ref 0.2–1.2)
Total Protein: 7.7 g/dL (ref 6.0–8.3)

## 2020-10-17 LAB — SEDIMENTATION RATE: Sed Rate: 62 mm/hr — ABNORMAL HIGH (ref 0–20)

## 2020-10-17 LAB — CBC WITH DIFFERENTIAL/PLATELET
Basophils Absolute: 0 10*3/uL (ref 0.0–0.1)
Basophils Relative: 0.6 % (ref 0.0–3.0)
Eosinophils Absolute: 0 10*3/uL (ref 0.0–0.7)
Eosinophils Relative: 0.5 % (ref 0.0–5.0)
HCT: 34.4 % — ABNORMAL LOW (ref 36.0–46.0)
Hemoglobin: 11.9 g/dL — ABNORMAL LOW (ref 12.0–15.0)
Lymphocytes Relative: 13.5 % (ref 12.0–46.0)
Lymphs Abs: 0.8 10*3/uL (ref 0.7–4.0)
MCHC: 34.5 g/dL (ref 30.0–36.0)
MCV: 88.6 fl (ref 78.0–100.0)
Monocytes Absolute: 0.7 10*3/uL (ref 0.1–1.0)
Monocytes Relative: 11.1 % (ref 3.0–12.0)
Neutro Abs: 4.5 10*3/uL (ref 1.4–7.7)
Neutrophils Relative %: 74.3 % (ref 43.0–77.0)
Platelets: 283 10*3/uL (ref 150.0–400.0)
RBC: 3.88 Mil/uL (ref 3.87–5.11)
RDW: 12.7 % (ref 11.5–15.5)
WBC: 6 10*3/uL (ref 4.0–10.5)

## 2020-10-17 LAB — LUTEINIZING HORMONE: LH: 10.1 m[IU]/mL

## 2020-10-17 LAB — FOLLICLE STIMULATING HORMONE: FSH: 11.4 m[IU]/mL

## 2020-10-17 NOTE — Telephone Encounter (Signed)
Can you please see if lab can add on CK level to today's labs or CMV (see future lab tests for details).  If not, I will plan to draw them on Friday AM when she comes for a lab visit.

## 2020-10-17 NOTE — Assessment & Plan Note (Signed)
Previous work up unrevealing except for elevated ESR.  (neg RMSF, neg lyme, neg RA/ANA, mild anemia (mild).  Will repeat labs as below including EBV, acute hep panel.

## 2020-10-17 NOTE — Telephone Encounter (Signed)
Reviewed lab work.  Left message for patient to return my call on my cell this evening.   Addendum:  spoke with patient.  She is not taking any supplements or NSAIDS.  She will return to the lab Friday AM for additional labs.  Advised her that we will cancel her CT scan for now due to Cr. Recommended hydration And one additional Kdur tab this evening only.

## 2020-10-17 NOTE — Patient Instructions (Signed)
Please complete lab work prior to leaving.   

## 2020-10-17 NOTE — Progress Notes (Signed)
Subjective:   By signing my name below, I, Nicole Quinn, attest that this documentation has been prepared under the direction and in the presence of Nicole Craze NP. 10/17/2020    Patient ID: Nicole Quinn, female    DOB: [birth year], 45 y.o.   MRN: [id number]  Chief Complaint  Patient presents with   Follow-up    HPI Patient is in today for a office visit. Her mother is present with her during this visit.   Fatigue- During last visit she complained of fatigue. Her lab results showed mild anemia and an elevated ESR. She continues complain of fatigue and also complains of frequent headaches, body aches, nausea, decreased appetite and vomiting for the past week. She denies having any unusual stress or depression symptoms at this time. She reports having episodes of hot flashes and feeling cold. She has not had Covid-19 before.  Muscle pain- She continues complaining aching from her back to both her hips.  Constipation- She has not had a bowel movement in the past 3-4 days.    Health Maintenance Due  Topic Date Due   COVID-19 Vaccine (3 - Booster) 03/21/2020    Past Medical History:  Diagnosis Date   Allergy    Anemia    Endometriosis    History of echocardiogram    Echo 6/18: EF 60-65, no RWMA, normal diastolic function   History of nuclear stress test    Myoview 6/18: EF 60, normal perfusion, Low Risk   History of UTI    Hypertension    pregnancy induced HTN/pre-eclampsia //  on meds since 2nd child (2000)    Past Surgical History:  Procedure Laterality Date   ABDOMINAL HYSTERECTOMY  2003   partial   BREAST BIOPSY Left 10/01/2017   CARPAL TUNNEL RELEASE     right   GANGLION CYST EXCISION     right   Hammer Toe Repair Bilateral 12/29/2013   Lt #3, #4, Rt #2.    Family History  Problem Relation Age of Onset   Allergies Daughter    Allergic rhinitis Daughter    Allergies Son    Allergic rhinitis Son    Leukemia Paternal Grandmother    Arthritis Mother     Hyperlipidemia Mother    Hypertension Mother    Stroke Mother    Allergic rhinitis Mother    Hypertension Father    Diabetes Father    Arthritis Maternal Grandmother    Stroke Maternal Grandmother 50   Hypertension Maternal Grandmother    Heart attack Maternal Grandmother 82   Coronary artery disease Maternal Aunt 50   Coronary artery disease Maternal Aunt 50   Angioedema Neg Hx    Asthma Neg Hx    Eczema Neg Hx    Immunodeficiency Neg Hx    Urticaria Neg Hx     Social History   Socioeconomic History   Marital status: Married    Spouse name: Not on file   Number of children: 2   Years of education: Not on file   Highest education level: Not on file  Occupational History    Employer: [employer]    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   Married.  Lives with one child.  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        Social Determinants of Health   Financial Resource Strain: Not on file  Food Insecurity: Not on file  Transportation Needs: Not on file  Physical Activity: Not on file  Stress: Not on file  Social Connections: Not on file  Intimate Partner Violence: Not on file    Outpatient Medications Prior to Visit  Medication Sig Dispense Refill   methocarbamol (ROBAXIN) 500 MG tablet Take 1 tablet (500 mg total) by mouth every 8 (eight) hours as needed for muscle spasms. 30 tablet 0   Multiple Vitamin (MULTIVITAMIN) tablet Take 1 tablet by mouth daily.     potassium chloride SA (KLOR-CON) 20 MEQ tablet Take 2 tablets (40 mEq total) by mouth in the morning, at noon, in the evening, and at bedtime. 240 tablet 5    spironolactone (ALDACTONE) 50 MG tablet Take 1 tablet (50 mg total) by mouth daily. 30 tablet 2   No facility-administered medications prior to visit.    Allergies  Allergen Reactions   Amoxicillin     rash   Ace Inhibitors     REACTION: lip swelling    Review of Systems  Constitutional:  Positive for malaise/fatigue.       (+)decreased appetite  Gastrointestinal:  Positive for constipation, nausea and vomiting.  Musculoskeletal:  Positive for back pain (back ache) and joint pain (bilateral hip ache).  Neurological:  Positive for dizziness (frequent).  Psychiatric/Behavioral:  Negative for depression.        (-)Stress      Objective:    Physical Exam Constitutional:      General: She is not in acute distress.    Appearance: Normal appearance. She is not ill-appearing.  HENT:     Head: Normocephalic and atraumatic.     Right Ear: Tympanic membrane, ear canal and external ear normal.     Left Ear: Tympanic membrane, ear canal and external ear normal.  Eyes:     Extraocular Movements: Extraocular movements intact.     Pupils: Pupils are equal, round, and reactive to light.     Comments: No nystagmus  Cardiovascular:     Rate and Rhythm: Normal rate and regular rhythm.     Heart sounds: Normal heart sounds. No murmur heard.   No gallop.  Pulmonary:     Effort: Pulmonary effort is normal. No respiratory distress.     Breath sounds: Normal breath sounds. No wheezing or rales.  Abdominal:     General: Bowel sounds are decreased.     Palpations: Abdomen is soft.     Comments: + RLQ and LLQ tenderness without guarding or rebound  Musculoskeletal:     Comments: No visible goint swelling  Lymphadenopathy:     Cervical: No cervical adenopathy.  Skin:    General: Skin is warm and dry.  Neurological:     Mental Status: She is alert and oriented to person, place, and time.  Psychiatric:        Behavior: Behavior normal.        Judgment: Judgment normal.    BP 103/81    Pulse 70   Temp 97.8 F (36.6 C)   Resp 18   Ht $R'5\' 4"'uj$  (1.626 m)   Wt 173 lb 12.8 oz (78.8 kg)   SpO2 100%   BMI 29.83 kg/m  Wt Readings from Last 3 Encounters:  10/17/20 173 lb 12.8 oz (78.8 kg)  09/21/20 180 lb (81.6 kg)  03/20/20 165 lb (74.8 kg)       Assessment & Plan:   Problem List Items Addressed This Visit       Unprioritized   Polyarthralgia    Previous work up unrevealing except for elevated ESR.  (neg RMSF, neg lyme, neg RA/ANA, mild anemia (mild).  Will repeat labs as below including EBV, acute hep panel.        Relevant Orders   CBC with Differential/Platelet   Sedimentation rate   Comp Met (CMET)   Ambulatory referral to Rheumatology   Hepatitis, Acute   EPSTEIN-BARR VIRUS (EBV) Antibody Profile   Hot flashes   Relevant Orders   Estradiol   FSH   LH   Abdominal pain - Primary   Relevant Orders   CT Abdomen Pelvis W Contrast     No orders of the defined types were placed in this encounter.   I, [name], personally preformed the services described in this documentation.  All medical record entries made by the scribe were at my direction and in my presence.  I have reviewed the chart and discharge instructions (if applicable) and agree that the record reflects my personal performance and is accurate and complete. 10/17/2020   I,Nicole Quinn,acting as a scribe for [name], NP.,have documented all relevant documentation on the behalf of [name], NP,as directed by  [name], NP while in the presence of [name], NP.   [name], NP

## 2020-10-18 LAB — HEPATITIS PANEL, ACUTE
Hep A IgM: NONREACTIVE
Hep B C IgM: NONREACTIVE
Hepatitis B Surface Ag: NONREACTIVE
Hepatitis C Ab: NONREACTIVE
SIGNAL TO CUT-OFF: 0.02 (ref <1.00)

## 2020-10-18 LAB — ESTRADIOL: Estradiol: 65 pg/mL

## 2020-10-18 NOTE — Telephone Encounter (Signed)
Opened in error

## 2020-10-19 ENCOUNTER — Encounter (HOSPITAL_COMMUNITY): Payer: Self-pay

## 2020-10-19 ENCOUNTER — Telehealth: Payer: Self-pay

## 2020-10-19 ENCOUNTER — Inpatient Hospital Stay (HOSPITAL_COMMUNITY)
Admission: EM | Admit: 2020-10-19 | Discharge: 2020-10-22 | DRG: 918 | Disposition: A | Discharge status: Home / Self Care | Payer: BC Managed Care – PPO | Attending: Internal Medicine | Admitting: Internal Medicine

## 2020-10-19 ENCOUNTER — Telehealth: Payer: Self-pay | Admitting: Family

## 2020-10-19 ENCOUNTER — Other Ambulatory Visit: Payer: Self-pay

## 2020-10-19 ENCOUNTER — Other Ambulatory Visit (INDEPENDENT_AMBULATORY_CARE_PROVIDER_SITE_OTHER): Payer: BC Managed Care – PPO

## 2020-10-19 ENCOUNTER — Emergency Department (HOSPITAL_COMMUNITY): Payer: BC Managed Care – PPO

## 2020-10-19 DIAGNOSIS — R945 Abnormal results of liver function studies: Secondary | ICD-10-CM

## 2020-10-19 DIAGNOSIS — N179 Acute kidney failure, unspecified: Secondary | ICD-10-CM

## 2020-10-19 DIAGNOSIS — E669 Obesity, unspecified: Secondary | ICD-10-CM | POA: Diagnosis present

## 2020-10-19 DIAGNOSIS — R1084 Generalized abdominal pain: Secondary | ICD-10-CM | POA: Diagnosis not present

## 2020-10-19 DIAGNOSIS — R7401 Elevation of levels of liver transaminase levels: Secondary | ICD-10-CM | POA: Diagnosis present

## 2020-10-19 DIAGNOSIS — Z83438 Family history of other disorder of lipoprotein metabolism and other lipidemia: Secondary | ICD-10-CM

## 2020-10-19 DIAGNOSIS — K5900 Constipation, unspecified: Secondary | ICD-10-CM | POA: Diagnosis not present

## 2020-10-19 DIAGNOSIS — T50901A Poisoning by unspecified drugs, medicaments and biological substances, accidental (unintentional), initial encounter: Principal | ICD-10-CM | POA: Diagnosis present

## 2020-10-19 DIAGNOSIS — Z888 Allergy status to other drugs, medicaments and biological substances status: Secondary | ICD-10-CM

## 2020-10-19 DIAGNOSIS — Z8261 Family history of arthritis: Secondary | ICD-10-CM | POA: Diagnosis not present

## 2020-10-19 DIAGNOSIS — Z880 Allergy status to penicillin: Secondary | ICD-10-CM

## 2020-10-19 DIAGNOSIS — I10 Essential (primary) hypertension: Secondary | ICD-10-CM | POA: Diagnosis not present

## 2020-10-19 DIAGNOSIS — S36118A Other injury of liver, initial encounter: Secondary | ICD-10-CM | POA: Diagnosis not present

## 2020-10-19 DIAGNOSIS — R7989 Other specified abnormal findings of blood chemistry: Secondary | ICD-10-CM | POA: Diagnosis not present

## 2020-10-19 DIAGNOSIS — E876 Hypokalemia: Secondary | ICD-10-CM | POA: Diagnosis not present

## 2020-10-19 DIAGNOSIS — Z823 Family history of stroke: Secondary | ICD-10-CM | POA: Diagnosis not present

## 2020-10-19 DIAGNOSIS — Z833 Family history of diabetes mellitus: Secondary | ICD-10-CM | POA: Diagnosis not present

## 2020-10-19 DIAGNOSIS — Z90710 Acquired absence of both cervix and uterus: Secondary | ICD-10-CM

## 2020-10-19 DIAGNOSIS — Z79899 Other long term (current) drug therapy: Secondary | ICD-10-CM | POA: Diagnosis not present

## 2020-10-19 DIAGNOSIS — K314 Gastric diverticulum: Secondary | ICD-10-CM | POA: Diagnosis not present

## 2020-10-19 DIAGNOSIS — R739 Hyperglycemia, unspecified: Secondary | ICD-10-CM | POA: Diagnosis present

## 2020-10-19 DIAGNOSIS — Z6829 Body mass index (BMI) 29.0-29.9, adult: Secondary | ICD-10-CM

## 2020-10-19 DIAGNOSIS — K824 Cholesterolosis of gallbladder: Secondary | ICD-10-CM | POA: Diagnosis not present

## 2020-10-19 DIAGNOSIS — Z806 Family history of leukemia: Secondary | ICD-10-CM | POA: Diagnosis not present

## 2020-10-19 DIAGNOSIS — R5383 Other fatigue: Secondary | ICD-10-CM

## 2020-10-19 DIAGNOSIS — Z8249 Family history of ischemic heart disease and other diseases of the circulatory system: Secondary | ICD-10-CM | POA: Diagnosis not present

## 2020-10-19 DIAGNOSIS — K429 Umbilical hernia without obstruction or gangrene: Secondary | ICD-10-CM | POA: Diagnosis not present

## 2020-10-19 DIAGNOSIS — Z20822 Contact with and (suspected) exposure to covid-19: Secondary | ICD-10-CM | POA: Diagnosis not present

## 2020-10-19 LAB — COMPREHENSIVE METABOLIC PANEL
ALT: 263 U/L — ABNORMAL HIGH (ref 0–35)
ALT: 340 U/L — ABNORMAL HIGH (ref 0–44)
AST: 220 U/L — ABNORMAL HIGH (ref 0–37)
AST: 398 U/L — ABNORMAL HIGH (ref 15–41)
Albumin: 4.5 g/dL (ref 3.5–5.2)
Albumin: 4 g/dL (ref 3.5–5.0)
Alkaline Phosphatase: 334 U/L — ABNORMAL HIGH (ref 39–117)
Alkaline Phosphatase: 370 U/L — ABNORMAL HIGH (ref 38–126)
Anion gap: 14 (ref 5–15)
BUN: 21 mg/dL (ref 6–23)
BUN: 24 mg/dL — ABNORMAL HIGH (ref 6–20)
CO2: 32 mEq/L (ref 19–32)
CO2: 28 mmol/L (ref 22–32)
Calcium: 9.9 mg/dL (ref 8.4–10.5)
Calcium: 9.8 mg/dL (ref 8.9–10.3)
Chloride: 86 mEq/L — ABNORMAL LOW (ref 96–112)
Chloride: 93 mmol/L — ABNORMAL LOW (ref 98–111)
Creatinine, Ser: 1.46 mg/dL — ABNORMAL HIGH (ref 0.40–1.20)
Creatinine, Ser: 1.27 mg/dL — ABNORMAL HIGH (ref 0.44–1.00)
GFR, Estimated: 54 mL/min — ABNORMAL LOW (ref >60)
GFR: 43.78 mL/min — ABNORMAL LOW (ref >60.00)
Glucose, Bld: 95 mg/dL (ref 70–99)
Glucose, Bld: 102 mg/dL — ABNORMAL HIGH (ref 70–99)
Potassium: 2.5 mEq/L — CL (ref 3.5–5.1)
Potassium: 2.4 mmol/L — CL (ref 3.5–5.1)
Sodium: 132 mEq/L — ABNORMAL LOW (ref 135–145)
Sodium: 135 mmol/L (ref 135–145)
Total Bilirubin: 0.4 mg/dL (ref 0.2–1.2)
Total Bilirubin: 0.4 mg/dL (ref 0.3–1.2)
Total Protein: 8.2 g/dL (ref 6.0–8.3)
Total Protein: 8.2 g/dL — ABNORMAL HIGH (ref 6.5–8.1)

## 2020-10-19 LAB — EPSTEIN-BARR VIRUS (EBV) ANTIBODY PROFILE
EBV NA IgG: >600 U/mL — ABNORMAL HIGH (ref 0.0–17.9)
EBV VCA IgG: >600 U/mL — ABNORMAL HIGH (ref 0.0–17.9)
EBV VCA IgM: <36 U/mL (ref 0.0–35.9)

## 2020-10-19 LAB — CBC WITH DIFFERENTIAL/PLATELET
Abs Immature Granulocytes: 0.02 10*3/uL (ref 0.00–0.07)
Basophils Absolute: 0 10*3/uL (ref 0.0–0.1)
Basophils Relative: 1 %
Eosinophils Absolute: 0 10*3/uL (ref 0.0–0.5)
Eosinophils Relative: 1 %
HCT: 33.3 % — ABNORMAL LOW (ref 36.0–46.0)
Hemoglobin: 11.8 g/dL — ABNORMAL LOW (ref 12.0–15.0)
Immature Granulocytes: 0 %
Lymphocytes Relative: 27 %
Lymphs Abs: 1.4 10*3/uL (ref 0.7–4.0)
MCH: 30.8 pg (ref 26.0–34.0)
MCHC: 35.4 g/dL (ref 30.0–36.0)
MCV: 86.9 fL (ref 80.0–100.0)
Monocytes Absolute: 0.6 10*3/uL (ref 0.1–1.0)
Monocytes Relative: 11 %
Neutro Abs: 3.1 10*3/uL (ref 1.7–7.7)
Neutrophils Relative %: 60 %
Platelets: 333 10*3/uL (ref 150–400)
RBC: 3.83 MIL/uL — ABNORMAL LOW (ref 3.87–5.11)
RDW: 12.4 % (ref 11.5–15.5)
WBC: 5.1 10*3/uL (ref 4.0–10.5)
nRBC: 0 % (ref 0.0–0.2)

## 2020-10-19 LAB — URINALYSIS, ROUTINE W REFLEX MICROSCOPIC
Bacteria, UA: NONE SEEN
Bilirubin Urine: NEGATIVE
Glucose, UA: NEGATIVE mg/dL
Hgb urine dipstick: NEGATIVE
Ketones, ur: NEGATIVE mg/dL
Leukocytes,Ua: NEGATIVE
Nitrite: NEGATIVE
Protein, ur: 30 mg/dL — AB
Specific Gravity, Urine: 1.014 (ref 1.005–1.030)
pH: 6 (ref 5.0–8.0)

## 2020-10-19 LAB — CK: Total CK: 38 U/L (ref 7–177)

## 2020-10-19 LAB — I-STAT BETA HCG BLOOD, ED (MC, WL, AP ONLY): I-stat hCG, quantitative: <5 m[IU]/mL (ref <5)

## 2020-10-19 LAB — MAGNESIUM: Magnesium: 2.2 mg/dL (ref 1.7–2.4)

## 2020-10-19 MED ORDER — FENTANYL CITRATE PF 50 MCG/ML IJ SOSY
50.0000 ug | PREFILLED_SYRINGE | Freq: Once | INTRAMUSCULAR | Status: AC
  Administered 2020-10-19: 50 ug via INTRAVENOUS
  Filled 2020-10-19: qty 1

## 2020-10-19 MED ORDER — IOHEXOL 350 MG/ML SOLN
80.0000 mL | Freq: Once | INTRAVENOUS | Status: AC | PRN
  Administered 2020-10-19: 80 mL via INTRAVENOUS

## 2020-10-19 MED ORDER — POTASSIUM CHLORIDE 10 MEQ/100ML IV SOLN
10.0000 meq | INTRAVENOUS | Status: AC
  Administered 2020-10-19 – 2020-10-20 (×4): 10 meq via INTRAVENOUS
  Filled 2020-10-19 (×4): qty 100

## 2020-10-19 MED ORDER — SODIUM CHLORIDE 0.9 % IV BOLUS
1000.0000 mL | Freq: Once | INTRAVENOUS | Status: AC
  Administered 2020-10-19: 1000 mL via INTRAVENOUS

## 2020-10-19 MED ORDER — POTASSIUM CHLORIDE CRYS ER 20 MEQ PO TBCR
40.0000 meq | EXTENDED_RELEASE_TABLET | Freq: Once | ORAL | Status: AC
  Administered 2020-10-19: 40 meq via ORAL
  Filled 2020-10-19: qty 2

## 2020-10-19 NOTE — ED Provider Notes (Signed)
Emergency Medicine Provider Triage Evaluation Note  Nicole Quinn , a 45 y.o. female  was evaluated in triage.  Pt complains of hypokalemia and elevated liver enzymes.  Patient had lab work obtained earlier this week by primary care provider as she was having lower abdominal pain, nausea, vomiting, generalized body aches, fatigue and headache.  Denies any IV drug use.  Review of Systems  Positive: Abnormal labs, ower abdominal pain, nausea, vomiting, generalized body aches, fatigue and headache. Negative: Cough, chest pain, shortness of breath  Physical Exam  BP 127/85 (BP Location: Right Arm)   Pulse 88   Temp 99.5 F (37.5 C) (Oral)   Resp 16   Ht 5\' 4"  (1.626 m)   Wt 78.5 kg   SpO2 100%   BMI 29.70 kg/m  Gen:   Awake, no distress   Resp:  Normal effort  MSK:   Moves extremities without difficulty  Other:    Medical Decision Making  Medically screening exam initiated at 6:20 PM.  Appropriate orders placed.  Nicole Quinn was informed that the remainder of the evaluation will be completed by another provider, this initial triage assessment does not replace that evaluation, and the importance of remaining in the ED until their evaluation is complete.  The patient appears stable so that the remainder of the work up may be completed by another provider.      Nicole Beckwith, PA-C 10/19/20 1821    Nicole Bo, MD 10/20/20 1104

## 2020-10-19 NOTE — ED Notes (Signed)
Pt ambulated to the restroom with minimal assistance at this time.

## 2020-10-19 NOTE — Telephone Encounter (Signed)
Spoke to patient. She is preparing now to leave for the ER.

## 2020-10-19 NOTE — ED Provider Notes (Signed)
[facility] DEPT Provider Note   CSN: [id number] Arrival date & time: 10/19/20  1748     History Chief Complaint  Patient presents with   abnormal labs    Nicole Quinn is a 45 y.o. female.  HPI Patient presents for evaluation of abnormal labs, done by medical provider earlier today.  There was concern for hypokalemia as well as elevation of her transaminases.  Apparently labs were done to evaluate malaise, associated with achiness, fatigue, headache, abdominal pain and nausea and vomiting.  She also complains about lower abdominal pain for the last several days.  Medical history includes hypertension, hyperglycemia, obesity, depression.  She states she has been vomiting for several days.  She denies fever.  Has not had chronic GI symptoms.  She does have a chronic low potassium, which has been treated with spironolactone and potassium supplementation.  She denies known sick exposure.  No prior history of liver disorder.    Past Medical History:  Diagnosis Date   Allergy    Anemia    Endometriosis    History of echocardiogram    Echo 6/18: EF 60-65, no RWMA, normal diastolic function   History of nuclear stress test    Myoview 6/18: EF 60, normal perfusion, Low Risk   History of UTI    Hypertension    pregnancy induced HTN/pre-eclampsia //  on meds since 2nd child (2000)    Patient Active Problem List   Diagnosis Date Noted   Abdominal pain 10/17/2020   Polyarthralgia 10/17/2020   Hot flashes 10/17/2020   Body aches 09/21/2020   Dysuria 02/13/2018   Arthralgia 02/13/2018   Angioedema 06/26/2016   Seasonal and perennial allergic rhinitis 06/26/2016   Allergic conjunctivitis 06/26/2016   Food allergy 06/26/2016   Allergic urticaria 06/26/2016   Depression 01/29/2016   Left wrist pain 11/29/2015   Obesity (BMI 30.0-34.9) 08/12/2013   Other and unspecified hyperlipidemia 02/24/2013   Chronic constipation 02/22/2013   Routine general  medical examination at a health care facility 02/22/2013   Anxiety associated with depression 10/10/2011   Dyspnea 08/24/2011   OTHER ACNE 06/04/2009   HYPERGLYCEMIA, BORDERLINE 06/04/2009   Essential hypertension 04/10/2009    Past Surgical History:  Procedure Laterality Date   ABDOMINAL HYSTERECTOMY  2003   partial   BREAST BIOPSY Left 10/01/2017   CARPAL TUNNEL RELEASE     right   GANGLION CYST EXCISION     right   Hammer Toe Repair Bilateral 12/29/2013   Lt #3, #4, Rt #2.     OB History   No obstetric history on file.     Family History  Problem Relation Age of Onset   Allergies Daughter    Allergic rhinitis Daughter    Allergies Son    Allergic rhinitis Son    Leukemia Paternal Grandmother    Arthritis Mother    Hyperlipidemia Mother    Hypertension Mother    Stroke Mother    Allergic rhinitis Mother    Hypertension Father    Diabetes Father    Arthritis Maternal Grandmother    Stroke Maternal Grandmother 103   Hypertension Maternal Grandmother    Heart attack Maternal Grandmother 84   Coronary artery disease Maternal Aunt 50   Coronary artery disease Maternal Aunt 50   Angioedema Neg Hx    Asthma Neg Hx    Eczema Neg Hx    Immunodeficiency Neg Hx    Urticaria Neg Hx     Social History  Tobacco Use   Smoking status: Never   Smokeless tobacco: Never  Vaping Use   Vaping Use: Never used  Substance Use Topics   Alcohol use: No    Alcohol/week: 0.0 standard drinks   Drug use: No    Home Medications Prior to Admission medications   Medication Sig Start Date End Date Taking? Authorizing Provider  methocarbamol (ROBAXIN) 500 MG tablet Take 1 tablet (500 mg total) by mouth every 8 (eight) hours as needed for muscle spasms. 10/02/20   Debbrah Alar, NP  Multiple Vitamin (MULTIVITAMIN) tablet Take 1 tablet by mouth daily.    [provider]  potassium chloride SA (KLOR-CON) 20 MEQ tablet Take 2 tablets (40 mEq total) by mouth in the  morning, at noon, in the evening, and at bedtime. 03/27/20   Debbrah Alar, NP  spironolactone (ALDACTONE) 50 MG tablet Take 1 tablet (50 mg total) by mouth daily. 05/15/20   Debbrah Alar, NP    Allergies    Amoxicillin and Ace inhibitors  Review of Systems   Review of Systems  All other systems reviewed and are negative.  Physical Exam Updated Vital Signs BP 127/85 (BP Location: Right Arm)   Pulse 88   Temp 99.5 F (37.5 C) (Oral)   Resp 16   Ht _0  (1.626 m)   Wt 78.5 kg   SpO2 100%   BMI 29.70 kg/m   Physical Exam Vitals and nursing note reviewed.  Constitutional:      General: She is not in acute distress.    Appearance: She is well-developed. She is not ill-appearing, toxic-appearing or diaphoretic.  HENT:     Head: Normocephalic and atraumatic.     Right Ear: External ear normal.     Left Ear: External ear normal.  Eyes:     Conjunctiva/sclera: Conjunctivae normal.     Pupils: Pupils are equal, round, and reactive to light.  Neck:     Trachea: Phonation normal.  Cardiovascular:     Rate and Rhythm: Normal rate and regular rhythm.     Heart sounds: Normal heart sounds.  Pulmonary:     Effort: Pulmonary effort is normal. No respiratory distress.     Breath sounds: Normal breath sounds. No stridor.  Abdominal:     General: There is no distension.     Palpations: Abdomen is soft.     Tenderness: There is no abdominal tenderness (Some periumbilical tenderness, mild.  No rebound tenderness). There is no guarding.     Hernia: No hernia is present.  Musculoskeletal:        General: Normal range of motion.     Cervical back: Normal range of motion and neck supple.  Skin:    General: Skin is warm and dry.  Neurological:     Mental Status: She is alert and oriented to person, place, and time.     Cranial Nerves: No cranial nerve deficit.     Sensory: No sensory deficit.     Motor: No abnormal muscle tone.     Coordination: Coordination normal.   Psychiatric:        Mood and Affect: Mood normal.        Behavior: Behavior normal.        Thought Content: Thought content normal.        Judgment: Judgment normal.    ED Results / Procedures / Treatments   Labs (all labs ordered are listed, but only abnormal results are displayed) Labs Reviewed  COMPREHENSIVE METABOLIC PANEL  CBC WITH DIFFERENTIAL/PLATELET  URINALYSIS, ROUTINE W REFLEX MICROSCOPIC  I-STAT BETA HCG BLOOD, ED ([facility], [facility], [facility] ONLY)    EKG EKG Interpretation  Date/Time:  Friday October 19 2020 19:35:42 EDT Ventricular Rate:  82 PR Interval:  192 QRS Duration: 84 QT Interval:  417 QTC Calculation: 487 R Axis:   59 Text Interpretation: Sinus rhythm Borderline repolarization abnormality Borderline prolonged QT interval Since last tracing QT has lengthened Confirmed by Daleen Bo [phone]) on 10/19/2020 10:54:36 PM  Radiology No results found.  Procedures .Critical Care Performed by: Daleen Bo, MD Authorized by: Daleen Bo, MD   Critical care provider statement:    Critical care time (minutes):  35   Critical care start time:  10/19/2020 7:15 PM   Critical care end time:  10/19/2020 11:17 PM   Critical care time was exclusive of:  Separately billable procedures and treating other patients   Critical care was necessary to treat or prevent imminent or life-threatening deterioration of the following conditions:  Metabolic crisis   Critical care was time spent personally by me on the following activities:  Blood draw for specimens, development of treatment plan with patient or surrogate, discussions with consultants, evaluation of patient's response to treatment, examination of patient, obtaining history from patient or surrogate, ordering and performing treatments and interventions, ordering and review of laboratory studies, pulse oximetry, re-evaluation of patient's condition, review of old charts and ordering and review of radiographic studies    Medications Ordered in ED Medications - No data to display  ED Course  I have reviewed the triage vital signs and the nursing notes.  Pertinent labs & imaging results that were available during my care of the patient were reviewed by me and considered in my medical decision making (see chart for details).    MDM Rules/Calculators/A&P                            Patient Vitals for the past 24 hrs:  BP Temp Temp src Pulse Resp SpO2 Height Weight  10/19/20 1936 115/80 99.5 F (37.5 C) Oral 79 12 100 % _0  (1.626 m) 78.5 kg  10/19/20 1816 -- -- -- -- -- -- _1  (1.626 m) 78.5 kg  10/19/20 1815 127/85 99.5 F (37.5 C) Oral 88 16 100 % -- --    11:17 PM Reevaluation with update and discussion. After initial assessment and treatment, an updated evaluation reveals she states she feels well enough to take an oral potassium now.  She still has mild abdominal pain and needs more medication.  Findings discussed with patient and husband, all questions answered. Daleen Bo   Medical Decision Making:  This patient is presenting for evaluation of abnormal labs, which does require a range of treatment options, and is a complaint that involves a moderate risk of morbidity and mortality. The differential diagnoses include acute liver infection, dehydration, potassium loss, chronic potassium deficiency. I decided to review old records, and in summary patient with climbing LFTs, over the last 48 hours, and history of chronic hypokalemia, etiology not clear.  I obtained additional historical information from husband at bedside.  Clinical Laboratory Tests Ordered, included CBC, Metabolic panel, Urinalysis, and Pregnancy test. Review indicates normal except potassium low, chloride low, glucose high, BUN high, creatinine high, total protein high, AST high, ALT high, alk phos stays high, GFR low, hemoglobin low. Radiologic Tests Ordered, included CT abdomen pelvis.  I independently Visualized:  Radiographic images, which  show no acute abnormalities  Cardiac Monitor Tracing which shows normal sinus rhythm     Critical Interventions-clinical evaluation, laboratory testing, IV fluids, IV potassium, oral potassium, radiography, observation and reassessment  After These Interventions, the Patient was reevaluated and was found improved but with significantly low potassium requiring prolonged parenteral treatment.  Patient will be admitted for further as well as further evaluation of transaminitis.  Doubt acute viral hepatitis.  CRITICAL CARE- yes Performed by: Daleen Bo  Nursing Notes Reviewed/ Care Coordinated Applicable Imaging Reviewed Interpretation of Laboratory Data incorporated into ED treatment   Plan: Admit  Final Clinical Impression(s) / ED Diagnoses Final diagnoses:  Hypokalemia  Transaminitis  Generalized abdominal pain    Rx / DC Orders ED Discharge Orders     None        Daleen Bo, MD 10/19/20 2320

## 2020-10-19 NOTE — ED Notes (Signed)
Patient transported to CT 

## 2020-10-19 NOTE — Telephone Encounter (Signed)
CRITICAL VALUE STICKER  CRITICAL VALUE: Potassium 2.5  RECEIVER (on-site recipient of call): Tena Linebaugh, RMA  DATE & TIME NOTIFIED: 10/19/2020 at 12:27 pm  MESSENGER (representative from lab): Saa  MD NOTIFIED: Debbrah Alar   TIME OF NOTIFICATION:12:28 pm  RESPONSE:

## 2020-10-19 NOTE — ED Triage Notes (Addendum)
Patient reports that she was told to come to the ED for abnormal labs- K+-2.5, elevated liver enzymes, elevated BUN and creatinine. Patient reports that she has not been feeling good since last week. Patient c/o generalized body aches, fatigue, and having a headache. Patient also added that she has had abdominal pain and nausea.

## 2020-10-19 NOTE — Telephone Encounter (Signed)
Left detailed message in patients voicemail to go directly to the ED due to worsening liver function tests and severe hypokalemia.

## 2020-10-20 ENCOUNTER — Emergency Department (HOSPITAL_COMMUNITY): Payer: BC Managed Care – PPO

## 2020-10-20 DIAGNOSIS — E876 Hypokalemia: Secondary | ICD-10-CM | POA: Diagnosis present

## 2020-10-20 DIAGNOSIS — T50901A Poisoning by unspecified drugs, medicaments and biological substances, accidental (unintentional), initial encounter: Secondary | ICD-10-CM | POA: Diagnosis present

## 2020-10-20 DIAGNOSIS — R739 Hyperglycemia, unspecified: Secondary | ICD-10-CM | POA: Diagnosis present

## 2020-10-20 DIAGNOSIS — Z8261 Family history of arthritis: Secondary | ICD-10-CM | POA: Diagnosis not present

## 2020-10-20 DIAGNOSIS — R7989 Other specified abnormal findings of blood chemistry: Secondary | ICD-10-CM | POA: Diagnosis not present

## 2020-10-20 DIAGNOSIS — R1084 Generalized abdominal pain: Secondary | ICD-10-CM | POA: Diagnosis not present

## 2020-10-20 DIAGNOSIS — R7401 Elevation of levels of liver transaminase levels: Secondary | ICD-10-CM | POA: Diagnosis present

## 2020-10-20 DIAGNOSIS — I10 Essential (primary) hypertension: Secondary | ICD-10-CM | POA: Diagnosis present

## 2020-10-20 DIAGNOSIS — Z90710 Acquired absence of both cervix and uterus: Secondary | ICD-10-CM | POA: Diagnosis not present

## 2020-10-20 DIAGNOSIS — Z79899 Other long term (current) drug therapy: Secondary | ICD-10-CM | POA: Diagnosis not present

## 2020-10-20 DIAGNOSIS — K824 Cholesterolosis of gallbladder: Secondary | ICD-10-CM | POA: Diagnosis not present

## 2020-10-20 DIAGNOSIS — E669 Obesity, unspecified: Secondary | ICD-10-CM | POA: Diagnosis present

## 2020-10-20 DIAGNOSIS — Z833 Family history of diabetes mellitus: Secondary | ICD-10-CM | POA: Diagnosis not present

## 2020-10-20 DIAGNOSIS — Z823 Family history of stroke: Secondary | ICD-10-CM | POA: Diagnosis not present

## 2020-10-20 DIAGNOSIS — S36118A Other injury of liver, initial encounter: Secondary | ICD-10-CM | POA: Diagnosis present

## 2020-10-20 DIAGNOSIS — K5900 Constipation, unspecified: Secondary | ICD-10-CM | POA: Diagnosis not present

## 2020-10-20 DIAGNOSIS — Z20822 Contact with and (suspected) exposure to covid-19: Secondary | ICD-10-CM | POA: Diagnosis present

## 2020-10-20 DIAGNOSIS — Z806 Family history of leukemia: Secondary | ICD-10-CM | POA: Diagnosis not present

## 2020-10-20 DIAGNOSIS — Z880 Allergy status to penicillin: Secondary | ICD-10-CM | POA: Diagnosis not present

## 2020-10-20 DIAGNOSIS — Z888 Allergy status to other drugs, medicaments and biological substances status: Secondary | ICD-10-CM | POA: Diagnosis not present

## 2020-10-20 DIAGNOSIS — Z6829 Body mass index (BMI) 29.0-29.9, adult: Secondary | ICD-10-CM | POA: Diagnosis not present

## 2020-10-20 DIAGNOSIS — Z83438 Family history of other disorder of lipoprotein metabolism and other lipidemia: Secondary | ICD-10-CM | POA: Diagnosis not present

## 2020-10-20 DIAGNOSIS — Z8249 Family history of ischemic heart disease and other diseases of the circulatory system: Secondary | ICD-10-CM | POA: Diagnosis not present

## 2020-10-20 LAB — COMPREHENSIVE METABOLIC PANEL
ALT: 286 U/L — ABNORMAL HIGH (ref 0–44)
AST: 269 U/L — ABNORMAL HIGH (ref 15–41)
Albumin: 3.4 g/dL — ABNORMAL LOW (ref 3.5–5.0)
Alkaline Phosphatase: 319 U/L — ABNORMAL HIGH (ref 38–126)
Anion gap: 7 (ref 5–15)
BUN: 14 mg/dL (ref 6–20)
CO2: 26 mmol/L (ref 22–32)
Calcium: 8.9 mg/dL (ref 8.9–10.3)
Chloride: 103 mmol/L (ref 98–111)
Creatinine, Ser: 0.92 mg/dL (ref 0.44–1.00)
GFR, Estimated: >60 mL/min (ref >60)
Glucose, Bld: 96 mg/dL (ref 70–99)
Potassium: 2.6 mmol/L — CL (ref 3.5–5.1)
Sodium: 136 mmol/L (ref 135–145)
Total Bilirubin: 0.3 mg/dL (ref 0.3–1.2)
Total Protein: 7.1 g/dL (ref 6.5–8.1)

## 2020-10-20 LAB — POTASSIUM
Potassium: 2.6 mmol/L — CL (ref 3.5–5.1)
Potassium: 3.3 mmol/L — ABNORMAL LOW (ref 3.5–5.1)

## 2020-10-20 LAB — ACETAMINOPHEN LEVEL: Acetaminophen (Tylenol), Serum: <10 ug/mL — ABNORMAL LOW (ref 10–30)

## 2020-10-20 LAB — RESP PANEL BY RT-PCR (FLU A&B, COVID) ARPGX2
Influenza A by PCR: NEGATIVE
Influenza B by PCR: NEGATIVE
SARS Coronavirus 2 by RT PCR: NEGATIVE

## 2020-10-20 LAB — NA AND K (SODIUM & POTASSIUM), RAND UR
Potassium Urine: 34 mmol/L
Sodium, Ur: 81 mmol/L

## 2020-10-20 LAB — PROTIME-INR
INR: 1 (ref 0.8–1.2)
Prothrombin Time: 13.4 seconds (ref 11.4–15.2)

## 2020-10-20 MED ORDER — LACTATED RINGERS IV SOLN: INTRAVENOUS | Status: DC

## 2020-10-20 MED ORDER — SPIRONOLACTONE 25 MG PO TABS: 50.0000 mg | ORAL_TABLET | Freq: Every day | ORAL | Status: DC

## 2020-10-20 MED ORDER — POTASSIUM CHLORIDE CRYS ER 20 MEQ PO TBCR
40.0000 meq | EXTENDED_RELEASE_TABLET | Freq: Four times a day (QID) | ORAL | Status: DC
  Administered 2020-10-20 – 2020-10-21 (×7): 40 meq via ORAL
  Filled 2020-10-20 (×7): qty 2

## 2020-10-20 MED ORDER — POTASSIUM CHLORIDE 2 MEQ/ML IV SOLN
INTRAVENOUS | Status: DC
  Filled 2020-10-20 (×4): qty 1000

## 2020-10-20 MED ORDER — SENNOSIDES-DOCUSATE SODIUM 8.6-50 MG PO TABS
1.0000 | ORAL_TABLET | Freq: Two times a day (BID) | ORAL | Status: DC
  Administered 2020-10-20 – 2020-10-22 (×5): 1 via ORAL
  Filled 2020-10-20 (×5): qty 1

## 2020-10-20 MED ORDER — SODIUM CHLORIDE 0.9 % IV SOLN
INTRAVENOUS | Status: DC | PRN
Start: 2020-10-20 — End: 2020-10-23
  Administered 2020-10-20: 500 mL via INTRAVENOUS

## 2020-10-20 MED ORDER — POLYETHYLENE GLYCOL 3350 17 G PO PACK
17.0000 g | PACK | Freq: Every day | ORAL | Status: DC
  Administered 2020-10-20 – 2020-10-21 (×2): 17 g via ORAL
  Filled 2020-10-20 (×2): qty 1

## 2020-10-20 MED ORDER — POTASSIUM CHLORIDE 10 MEQ/100ML IV SOLN
10.0000 meq | INTRAVENOUS | Status: AC
Start: 2020-10-20 — End: 2020-10-20
  Administered 2020-10-20 (×5): 10 meq via INTRAVENOUS
  Filled 2020-10-20 (×5): qty 100

## 2020-10-20 MED ORDER — METHOCARBAMOL 1000 MG/10ML IJ SOLN
500.0000 mg | Freq: Three times a day (TID) | INTRAVENOUS | Status: DC | PRN
  Filled 2020-10-20: qty 5

## 2020-10-20 MED ORDER — ONDANSETRON HCL 4 MG PO TABS: 4.0000 mg | ORAL_TABLET | Freq: Four times a day (QID) | ORAL | Status: DC | PRN

## 2020-10-20 MED ORDER — ENOXAPARIN SODIUM 40 MG/0.4ML IJ SOSY
40.0000 mg | PREFILLED_SYRINGE | INTRAMUSCULAR | Status: DC
  Administered 2020-10-20 – 2020-10-22 (×3): 40 mg via SUBCUTANEOUS
  Filled 2020-10-20 (×3): qty 0.4

## 2020-10-20 MED ORDER — KETOROLAC TROMETHAMINE 15 MG/ML IJ SOLN
7.5000 mg | Freq: Three times a day (TID) | INTRAMUSCULAR | Status: DC | PRN
  Administered 2020-10-20 – 2020-10-22 (×5): 7.5 mg via INTRAVENOUS
  Filled 2020-10-20 (×6): qty 1

## 2020-10-20 MED ORDER — METHOCARBAMOL 500 MG PO TABS: 500.0000 mg | ORAL_TABLET | Freq: Three times a day (TID) | ORAL | Status: DC | PRN

## 2020-10-20 MED ORDER — ONDANSETRON HCL 4 MG/2ML IJ SOLN: 4.0000 mg | Freq: Four times a day (QID) | INTRAMUSCULAR | Status: DC | PRN

## 2020-10-20 MED ORDER — FENTANYL CITRATE PF 50 MCG/ML IJ SOSY
50.0000 ug | PREFILLED_SYRINGE | Freq: Once | INTRAMUSCULAR | Status: AC
  Administered 2020-10-20: 50 ug via INTRAVENOUS
  Filled 2020-10-20: qty 1

## 2020-10-20 NOTE — H&P (Addendum)
History and Physical    Nicole Quinn VVZ:[id number] DOB: December 22, 1975 DOA: 10/19/2020  PCP: Debbrah Alar, NP  Patient coming from: Home  I have personally briefly reviewed patient's old medical records in [place]  Chief Complaint: LFT elevation  HPI: Nicole Quinn is a 45 y.o. female with medical history significant of chronic idiopathic hypokalemia typically managed with QID PO potassium and aldactone.  Pt with 1 month + of fatigue, body aches, headache, nausea.  1 week of decreased appetite, nausea, vomiting.  Seen at PCPs office on 9/28, labs drawn revealing mild hypokalemia and mild LFT elevations at that time.  Seen at PCPs office again today, labs re-drawn which now show K 2.5, LFTs in the 200s.  Pt sent in to ED.  Had breast mass biopsied earlier this month but pathology came back benign.  Eating doesn't make abd symptoms worse really per pt.  Only taking 2 pills of tylenol a day at most per pt.   ED Course: LFTs in the 300s now, ALK 370.  K 2.4.  K replacement started.  CT A/P neg for acute findings.  Hospitalist asked to admit.   Review of Systems: As per HPI, otherwise all review of systems negative.  Past Medical History:  Diagnosis Date   Allergy    Anemia    Endometriosis    History of echocardiogram    Echo 6/18: EF 60-65, no RWMA, normal diastolic function   History of nuclear stress test    Myoview 6/18: EF 60, normal perfusion, Low Risk   History of UTI    Hypertension    pregnancy induced HTN/pre-eclampsia //  on meds since 2nd child (2000)    Past Surgical History:  Procedure Laterality Date   ABDOMINAL HYSTERECTOMY  2003   partial   BREAST BIOPSY Left 10/01/2017   CARPAL TUNNEL RELEASE     right   GANGLION CYST EXCISION     right   Hammer Toe Repair Bilateral 12/29/2013   Lt #3, #4, Rt #2.     reports that she has never smoked. She has never used smokeless tobacco. She reports that she does not drink alcohol and  does not use drugs.  Allergies  Allergen Reactions   Amoxicillin     rash   Ace Inhibitors     REACTION: lip swelling    Family History  Problem Relation Age of Onset   Allergies Daughter    Allergic rhinitis Daughter    Allergies Son    Allergic rhinitis Son    Leukemia Paternal Grandmother    Arthritis Mother    Hyperlipidemia Mother    Hypertension Mother    Stroke Mother    Allergic rhinitis Mother    Hypertension Father    Diabetes Father    Arthritis Maternal Grandmother    Stroke Maternal Grandmother 73   Hypertension Maternal Grandmother    Heart attack Maternal Grandmother 17   Coronary artery disease Maternal Aunt 50   Coronary artery disease Maternal Aunt 50   Angioedema Neg Hx    Asthma Neg Hx    Eczema Neg Hx    Immunodeficiency Neg Hx    Urticaria Neg Hx      Prior to Admission medications   Medication Sig Start Date End Date Taking? Authorizing Provider  methocarbamol (ROBAXIN) 500 MG tablet Take 1 tablet (500 mg total) by mouth every 8 (eight) hours as needed for muscle spasms. 10/02/20   Debbrah Alar, NP  Multiple Vitamin (MULTIVITAMIN) tablet Take  1 tablet by mouth daily.    [provider]  potassium chloride SA (KLOR-CON) 20 MEQ tablet Take 2 tablets (40 mEq total) by mouth in the morning, at noon, in the evening, and at bedtime. 03/27/20   Debbrah Alar, NP  spironolactone (ALDACTONE) 50 MG tablet Take 1 tablet (50 mg total) by mouth daily. 05/15/20   Debbrah Alar, NP    Physical Exam: Vitals:   10/19/20 2145 10/19/20 2315 10/20/20 0123 10/20/20 0200  BP: 111/74 105/78 121/81 126/78  Pulse: 78 75 78 74  Resp: (!) 21 16 (!) 21 18  Temp:      TempSrc:      SpO2: 98% 100% 99% 97%  Weight:      Height:        Constitutional: NAD, calm, comfortable Eyes: PERRL, lids and conjunctivae normal ENMT: Mucous membranes are moist. Posterior pharynx clear of any exudate or lesions.Normal dentition.  Neck: normal, supple, no  masses, no thyromegaly Respiratory: clear to auscultation bilaterally, no wheezing, no crackles. Normal respiratory effort. No accessory muscle use.  Cardiovascular: Regular rate and rhythm, no murmurs / rubs / gallops. No extremity edema. 2+ pedal pulses. No carotid bruits.  Abdomen: Mild periumbilical TTP Musculoskeletal: no clubbing / cyanosis. No joint deformity upper and lower extremities. Good ROM, no contractures. Normal muscle tone.  Skin: no rashes, lesions, ulcers. No induration Neurologic: CN 2-12 grossly intact. Sensation intact, DTR normal. Strength 5/5 in all 4.  Psychiatric: Normal judgment and insight. Alert and oriented x 3. Normal mood.    Labs on Admission: I have personally reviewed following labs and imaging studies  CBC: Recent Labs  Lab 10/17/20 0813 10/19/20 1834  WBC 6.0 5.1  NEUTROABS 4.5 3.1  HGB 11.9* 11.8*  HCT 34.4* 33.3*  MCV 88.6 86.9  PLT 283.0 357   Basic Metabolic Panel: Recent Labs  Lab 10/17/20 0813 10/19/20 0742 10/19/20 1834  NA 129* 132* 135  K 3.3* 2.5* 2.4*  CL 89* 86* 93*  CO2 30 32 28  GLUCOSE 91 95 102*  BUN 22 21 24*  CREATININE 1.74* 1.46* 1.27*  CALCIUM 9.6 9.9 9.8  MG  --   --  2.2   GFR: Estimated Creatinine Clearance: 57.9 mL/min (A) (by C-G formula based on SCr of 1.27 mg/dL (H)). Liver Function Tests: Recent Labs  Lab 10/17/20 0813 10/19/20 0742 10/19/20 1834  AST 67* 220* 398*  ALT 50* 263* 340*  ALKPHOS 97 334* 370*  BILITOT 0.4 0.4 0.4  PROT 7.7 8.2 8.2*  ALBUMIN 4.3 4.5 4.0   No results for input(s): LIPASE, AMYLASE in the last 168 hours. No results for input(s): AMMONIA in the last 168 hours. Coagulation Profile: No results for input(s): INR, PROTIME in the last 168 hours. Cardiac Enzymes: Recent Labs  Lab 10/19/20 0742  CKTOTAL 38   BNP (last 3 results) No results for input(s): PROBNP in the last 8760 hours. HbA1C: No results for input(s): HGBA1C in the last 72 hours. CBG: No results for  input(s): GLUCAP in the last 168 hours. Lipid Profile: No results for input(s): CHOL, HDL, LDLCALC, TRIG, CHOLHDL, LDLDIRECT in the last 72 hours. Thyroid Function Tests: No results for input(s): TSH, T4TOTAL, FREET4, T3FREE, THYROIDAB in the last 72 hours. Anemia Panel: No results for input(s): VITAMINB12, FOLATE, FERRITIN, TIBC, IRON, RETICCTPCT in the last 72 hours. Urine analysis:    Component Value Date/Time   COLORURINE YELLOW 10/19/2020 2031   APPEARANCEUR CLEAR 10/19/2020 2031   LABSPEC 1.014 10/19/2020  2031   PHURINE 6.0 10/19/2020 2031   GLUCOSEU NEGATIVE 10/19/2020 2031   GLUCOSEU NEGATIVE 08/28/2017 1407   HGBUR NEGATIVE 10/19/2020 2031   BILIRUBINUR NEGATIVE 10/19/2020 2031   BILIRUBINUR NEG 02/12/2018 [place] 10/19/2020 2031   PROTEINUR 30 (A) 10/19/2020 2031   UROBILINOGEN 0.2 02/12/2018 1625   UROBILINOGEN 0.2 08/28/2017 1407   NITRITE NEGATIVE 10/19/2020 2031   LEUKOCYTESUR NEGATIVE 10/19/2020 2031    Radiological Exams on Admission: CT Abdomen Pelvis W Contrast  Result Date: 10/19/2020 CLINICAL DATA:  Abdominal infection suspected. EXAM: CT ABDOMEN AND PELVIS WITH CONTRAST TECHNIQUE: Multidetector CT imaging of the abdomen and pelvis was performed using the standard protocol following bolus administration of intravenous contrast. CONTRAST:  104mL OMNIPAQUE IOHEXOL 350 MG/ML SOLN COMPARISON:  Abdominal ultrasound 09/04/2005. CT chest 04/10/2009. FINDINGS: Lower chest: There is atelectasis in the lung bases. Hepatobiliary: No focal liver abnormality is seen. No gallstones, gallbladder wall thickening, or biliary dilatation. Pancreas: Unremarkable. No pancreatic ductal dilatation or surrounding inflammatory changes. Spleen: Normal in size without focal abnormality. Adrenals/Urinary Tract: Bilateral adrenal glands are within normal limits. Stomach/Bowel: Small gastric fundal diverticulum is unchanged. Stomach is otherwise within normal limits. Appendix  appears normal. No evidence of bowel wall thickening, distention, or inflammatory changes. Vascular/Lymphatic: No significant vascular findings are present. No enlarged abdominal or pelvic lymph nodes. Reproductive: Status post hysterectomy. No adnexal masses. Other: No abdominal wall hernia or abnormality. No abdominopelvic ascites. There is a small fat containing umbilical hernia. There is no ascites. Musculoskeletal: No acute or significant osseous findings. IMPRESSION: 1. No acute localizing process in the abdomen or pelvis. Electronically Signed   By: Ronney Asters M.D.   On: 10/19/2020 22:34    EKG: Independently reviewed.  Assessment/Plan Principal Problem:   Transaminitis Active Problems:   Chronic hypokalemia    Transaminitis, nausea/vomiting, malaise, body aches - Unclear cause Neg (or normal) work up thus far this month: ANA RA Lyme AB RMSF AB (IGM and G) EBV (IgG positive, IgM negative suggesting prior infection). Hep A,B, and C CPK TSH COVID and Flu CT A/P ESR elevated at 62 Ordering RUQ [place] If RUQ is negative, then pt likely warrants GI consult in AM Checking CMV Checking tylenol level - but doesn't sound like OD. NPO except sips with meds IVF: LR at 100 Acute on chronic hypokalemia - Replace K IV + PO Resume QID PO potassium Holding aldactone  DVT prophylaxis: Lovenox Code Status: Full Family Communication: Family at bedside Disposition Plan: Home after Liver work up [employer] called: None Admission status: place in obs     Robertta Halfhill, [facility]  How to contact the [facility] Attending or Consulting provider [place] or covering provider during after hours [place], for this patient?  Check the care team in [facility] and look for a) attending/consulting [facility] provider listed and b) the [facility] team listed Log into [email or web address]  Amion Physician Scheduling and messaging for groups and whole hospitals  On call and physician scheduling software for group  practices, residents, hospitalists and other medical providers for call, clinic, rotation and shift schedules. On[employer] is a hospital-wide system for scheduling doctors and paging doctors on call. EasyPlot is for scientific plotting and data analysis.  [email or web address]  and use [place]'s universal password to access. If you do not have the password, please contact the hospital operator.  Locate the [facility] provider you are looking for under [facility] and page to a number that you can  be directly reached. If you still have difficulty reaching the provider, please page the [facility] (Director on Call) for the Hospitalists listed on amion for assistance.  10/20/2020, 2:25 AM

## 2020-10-20 NOTE — Consult Note (Signed)
[facility] Referring Provider: Jennette Kettle, DO [facility] Primary Care Physician:  Debbrah Alar, NP Primary Gastroenterologist:  none  Reason for Consultation: Elevated liver tests  HPI:  Nicole Quinn is a very pleasant 45 year old woman who met in the emergency room with her husband this morning  Since returning from [place] 5 years ago she has had problems with idiopathic hypokalemia and has been on replacement potassium on a daily basis as well as Aldactone.  About 2 years ago she was having some vague symptoms and noted to have an elevated sed rate in the 20s.  She was evaluated by rheumatology at that time with essentially negative overall work-up.  She has been feeling poorly for about 1 month.  She has noticed fatigue, achy muscles and achy joints, nausea.  She has not had any vomiting.  She has had some mild generalized abdominal discomforts.  She has felt chills.  She has been undergoing testing through her primary care physician, see those myriad results below.  When repeat liver tests showed significant elevation in her transaminases she was told to proceed to the emergency room.  Imaging including CT scan and ultrasound show no obvious biliary dilation.  No stones in her gallbladder either.  She has not been on any new prescription medicines.  She has not taken any antibiotics in the past several months.  I asked her about over-the-counter, herbal medicines and she said she has not been on any.  I asked her this question a second time and again she denied it however her husband reminded her that both of them have been taking a "D herbs detox" which consists of 30 pills daily for the past 3 or 4 weeks.  Specifically she started this 30 pill/day regimen on September 12 and took it for 2 weeks until she stopped because she thought is was making her feel sick.   Past Medical History:  Diagnosis Date   Allergy    Anemia    Endometriosis    History of  echocardiogram    Echo 6/18: EF 60-65, no RWMA, normal diastolic function   History of nuclear stress test    Myoview 6/18: EF 60, normal perfusion, Low Risk   History of UTI    Hypertension    pregnancy induced HTN/pre-eclampsia //  on meds since 2nd child (2000)    Past Surgical History:  Procedure Laterality Date   ABDOMINAL HYSTERECTOMY  2003   partial   BREAST BIOPSY Left 10/01/2017   CARPAL TUNNEL RELEASE     right   GANGLION CYST EXCISION     right   Hammer Toe Repair Bilateral 12/29/2013   Lt #3, #4, Rt #2.    Prior to Admission medications   Medication Sig Start Date End Date Taking? Authorizing Provider  methocarbamol (ROBAXIN) 500 MG tablet Take 1 tablet (500 mg total) by mouth every 8 (eight) hours as needed for muscle spasms. 10/02/20  Yes Debbrah Alar, NP  Multiple Vitamin (MULTIVITAMIN) tablet Take 1 tablet by mouth daily.   Yes [provider]  potassium chloride SA (KLOR-CON) 20 MEQ tablet Take 2 tablets (40 mEq total) by mouth in the morning, at noon, in the evening, and at bedtime. 03/27/20  Yes Debbrah Alar, NP  spironolactone (ALDACTONE) 50 MG tablet Take 1 tablet (50 mg total) by mouth daily. 05/15/20  Yes Debbrah Alar, NP    Current Facility-Administered Medications  Medication Dose Route Frequency Provider Last Rate Last Admin   enoxaparin (LOVENOX) injection 40  mg  40 mg Subcutaneous Q24H Jennette Kettle M, DO       lactated ringers infusion   Intravenous Continuous Etta Quill, DO 100 mL/hr at 10/20/20 0646 New Bag at 10/20/20 0646   methocarbamol (ROBAXIN) tablet 500 mg  500 mg Oral Q8H PRN Etta Quill, DO       ondansetron [facility]) tablet 4 mg  4 mg Oral Q6H PRN Etta Quill, DO       Or   ondansetron [facility]) injection 4 mg  4 mg Intravenous Q6H PRN Etta Quill, DO       potassium chloride 10 mEq in 100 mL IVPB  10 mEq Intravenous Q1 Hr x 6 Jennette Kettle M, DO 100 mL/hr at 10/20/20 0647 10 mEq at 10/20/20  0647   potassium chloride SA (KLOR-CON) CR tablet 40 mEq  40 mEq Oral QID Etta Quill, DO       Current Outpatient Medications  Medication Sig Dispense Refill   methocarbamol (ROBAXIN) 500 MG tablet Take 1 tablet (500 mg total) by mouth every 8 (eight) hours as needed for muscle spasms. 30 tablet 0   Multiple Vitamin (MULTIVITAMIN) tablet Take 1 tablet by mouth daily.     potassium chloride SA (KLOR-CON) 20 MEQ tablet Take 2 tablets (40 mEq total) by mouth in the morning, at noon, in the evening, and at bedtime. 240 tablet 5   spironolactone (ALDACTONE) 50 MG tablet Take 1 tablet (50 mg total) by mouth daily. 30 tablet 2    Allergies as of 10/19/2020 - Review Complete 10/19/2020  Allergen Reaction Noted   Amoxicillin  08/22/2011   Ace inhibitors  04/10/2009    Family History  Problem Relation Age of Onset   Allergies Daughter    Allergic rhinitis Daughter    Allergies Son    Allergic rhinitis Son    Leukemia Paternal Grandmother    Arthritis Mother    Hyperlipidemia Mother    Hypertension Mother    Stroke Mother    Allergic rhinitis Mother    Hypertension Father    Diabetes Father    Arthritis Maternal Grandmother    Stroke Maternal Grandmother 57   Hypertension Maternal Grandmother    Heart attack Maternal Grandmother 40   Coronary artery disease Maternal Aunt 50   Coronary artery disease Maternal Aunt 50   Angioedema Neg Hx    Asthma Neg Hx    Eczema Neg Hx    Immunodeficiency Neg Hx    Urticaria Neg Hx     Social History   Socioeconomic History   Marital status: Married    Spouse name: Not on file   Number of children: 2   Years of education: Not on file   Highest education level: Not on file  Occupational History    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   Married.  Lives with one child.  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        Social Determinants of Health   Financial Resource Strain: Not on file  Food Insecurity: Not on file  Transportation Needs: Not on file  Physical Activity: Not on file  Stress: Not on file  Social Connections: Not on file  Intimate Partner Violence: Not on file    Review of Systems: Pertinent positive and negative review of systems were noted in the above HPI section. Complete review of systems was performed and was otherwise normal.   Physical Exam: Vital signs in last 24 hours: Temp:  [99.5 F (37.5 C)] 99.5 F (37.5 C) (09/30 1936) Pulse Rate:  [66-88] 66 (10/01 0700) Resp:  [12-21] 16 (10/01 0700) BP: (94-127)/(64-91) 103/77 (10/01 0700) SpO2:  [97 %-100 %] 100 % (10/01 0700) Weight:  [78.5 kg] 78.5 kg (09/30 1936)   Constitutional: generally well-appearing Psychiatric: alert and oriented x3 Eyes: extraocular movements intact Mouth: oral pharynx moist, no lesions Neck: supple no lymphadenopathy Cardiovascular: heart regular rate and rhythm Lungs: clear to auscultation bilaterally Abdomen: soft, slightly tender in RUQ, nondistended, no obvious ascites, no peritoneal signs, normal bowel sounds Extremities: no lower extremity edema bilaterally Skin: no lesions on visible extremities   Lab Results: Recent Labs    10/17/20 0813 10/19/20 1834  WBC 6.0 5.1  HGB 11.9* 11.8*  HCT 34.4* 33.3*  PLT 283.0 333  MCV 88.6 86.9   BMET Recent Labs    10/19/20 0742 10/19/20 1834 10/20/20 0458  NA 132* 135 136  K 2.5* 2.4* 2.6*  CL 86* 93* 103  CO2 32 28 26  GLUCOSE 95 102* 96  BUN 21 24* 14  CREATININE 1.46* 1.27* 0.92  CALCIUM 9.9 9.8 8.9   LFT Recent Labs    10/20/20 0458  BILITOT 0.3  AST  269*  ALT 286*  ALKPHOS 319*  PROT 7.1  ALBUMIN 3.4*   PT/INR Recent Labs    10/20/20 0457  LABPROT 13.4  INR 1.0   Hepatitis Panel Recent Labs    10/17/20 0813  HEPBSAG NON-REACTIVE  HEPAIGM NON-REACTIVE  HEPBIGM NON-REACTIVE   ENC VCA IbM negative EMC VCA IgG strongly positive EBC NA IgG strongly positive  Hepatitis A IgM negative Hepatitis B surface Ab negative Hepatitis B Core IgM negative Hepatitis C Ab negative  Covid negative' Influenza A, B negative  CMV IgG and IgM pending HIV pending Tylenol <10 ESR 62 (was 51 2 yeas ago)  09/2020 Rheum factor negative, ANA negative, B bugdorfi Abs negative, RMSF IgM and IgG negative, CK 61 (normal)   Imaging/Other Results: CT Abdomen Pelvis W Contrast  Result Date: 10/19/2020 CLINICAL DATA:  Abdominal infection suspected. EXAM: CT ABDOMEN AND PELVIS WITH CONTRAST TECHNIQUE: Multidetector CT imaging of the abdomen and pelvis was performed using the standard protocol following bolus administration of intravenous contrast. CONTRAST:  12mL OMNIPAQUE IOHEXOL 350 MG/ML SOLN COMPARISON:  Abdominal ultrasound 09/04/2005. CT chest 04/10/2009. FINDINGS: Lower chest: There is atelectasis in the lung bases. Hepatobiliary: No focal liver abnormality is seen. No gallstones, gallbladder wall thickening, or biliary dilatation. Pancreas: Unremarkable. No pancreatic ductal dilatation or surrounding inflammatory changes. Spleen: Normal in size without focal abnormality. Adrenals/Urinary Tract: Bilateral adrenal glands are within normal limits. Stomach/Bowel: Small gastric fundal diverticulum is unchanged. Stomach is otherwise within normal limits. Appendix appears normal. No evidence of bowel wall thickening, distention, or inflammatory changes. Vascular/Lymphatic: No significant vascular findings are present. No enlarged abdominal or pelvic lymph nodes. Reproductive: Status post hysterectomy. No adnexal masses. Other: No abdominal wall hernia or  abnormality. No abdominopelvic ascites. There is a small fat containing umbilical hernia. There is no ascites. Musculoskeletal:  No acute or significant osseous findings. IMPRESSION: 1. No acute localizing process in the abdomen or pelvis. Electronically Signed   By: Ronney Asters M.D.   On: 10/19/2020 22:34   US Abdomen Limited RUQ (LIVER/GB)  Result Date: 10/20/2020 CLINICAL DATA:  Transaminitis. EXAM: ULTRASOUND ABDOMEN LIMITED RIGHT UPPER QUADRANT COMPARISON:  None. FINDINGS: Gallbladder: A 2 mm nonshadowing echogenic polyp is seen along the nondependent portion of the gallbladder wall. No gallstones or wall thickening visualized (2.0 mm). No sonographic Murphy sign noted by sonographer. Common bile duct: Diameter: 5.2 mm Liver: No focal lesion identified. Within normal limits in parenchymal echogenicity. Portal vein is patent on color Doppler imaging with normal direction of blood flow towards the liver. Other: None. IMPRESSION: 2 mm gallbladder polyp, likely benign. No additional follow-up or imaging is recommended. This recommendation follows ACR consensus guidelines: White Paper of the ACR Incidental Findings Committee II on Gallbladder and Biliary Findings. J Am Coll Radiol 2013:;10:953-956. Electronically Signed   By: Virgina Norfolk M.D.   On: 10/20/2020 02:50      Impression/Plan: 45 y.o. female with elevated liver tests, fatigue, nausea  No biliary dilation.  Viral etiologies have been negative, awaiting CMV and HIV testing. ANA is negative.  I suspect that her Dherbs Detox is the culprit here. This is a 30 PILL per day regimen that she and her husband started 9/12 and she took for 2 weeks.  Her LFTs and renal tests were all normal prior to that.  Yesterday labs of AST 398, ALT 340, Alk phos 370 are all a bit improved today. Her Cr is normal (was 1.74 three days ago).  I searched for Dherbs Detox side effects on line and could not find any data at all about side effects.  I recommend  she absolutely completely never take this regimen again.  I considered recommending rheum and ID input but after getting this history I do not think that is necessary as long as her LFTs continue to improve.  We will follow along, will allow regular food.  Will order comp met panel again for the morning. May be safe for d/c tomorrow pending labs, clinical course.   Milus Banister, MD  10/20/2020, 7:39 AM [facility] Pager [phone]

## 2020-10-20 NOTE — Progress Notes (Signed)
Patient transported to room 1511 with all of her belongings.

## 2020-10-20 NOTE — Progress Notes (Signed)
Patient is admitted early this am, details please see HPI She is seen and examined, she c/o headache/neck pain, c/o lower abdominal pain, no bm for a week Start stool softener, prn robaxin for tension headache Persistent hypokalemia despite aggressive iv and oral supplement, check potassium level bid, continue replace k, transfer to tele bed mag 2.2 Lft elevated, gi consulted, will follow recommendation

## 2020-10-20 NOTE — Progress Notes (Signed)
Date and time results received: 10/20/20   Critical Value: Potassium 2.6 (Received call from lab at 11:31 AM.   Name of Provider Notified: Dr. Florencia Reasons, MD notified at 11:37 AM.   Orders Received? Or Actions Taken?: Response from Dr. Florencia Reasons, MD at 11:48 AM.   New orders from Dr. Florencia Reasons, MD: Transfer patient to tele.   In the process of waiting for an available tele bed.

## 2020-10-21 LAB — COMPREHENSIVE METABOLIC PANEL
ALT: 477 U/L — ABNORMAL HIGH (ref 0–44)
AST: 585 U/L — ABNORMAL HIGH (ref 15–41)
Albumin: 2.9 g/dL — ABNORMAL LOW (ref 3.5–5.0)
Alkaline Phosphatase: 388 U/L — ABNORMAL HIGH (ref 38–126)
Anion gap: 6 (ref 5–15)
BUN: 11 mg/dL (ref 6–20)
CO2: 27 mmol/L (ref 22–32)
Calcium: 9.1 mg/dL (ref 8.9–10.3)
Chloride: 106 mmol/L (ref 98–111)
Creatinine, Ser: 0.73 mg/dL (ref 0.44–1.00)
GFR, Estimated: >60 mL/min (ref >60)
Glucose, Bld: 94 mg/dL (ref 70–99)
Potassium: 3.8 mmol/L (ref 3.5–5.1)
Sodium: 139 mmol/L (ref 135–145)
Total Bilirubin: 0.2 mg/dL — ABNORMAL LOW (ref 0.3–1.2)
Total Protein: 6.4 g/dL — ABNORMAL LOW (ref 6.5–8.1)

## 2020-10-21 LAB — PROTIME-INR
INR: 1 (ref 0.8–1.2)
Prothrombin Time: 13.1 seconds (ref 11.4–15.2)

## 2020-10-21 LAB — POTASSIUM: Potassium: 4 mmol/L (ref 3.5–5.1)

## 2020-10-21 MED ORDER — METHOCARBAMOL 500 MG PO TABS: 500.0000 mg | ORAL_TABLET | Freq: Three times a day (TID) | ORAL | Status: DC | PRN

## 2020-10-21 MED ORDER — POLYETHYLENE GLYCOL 3350 17 G PO PACK
17.0000 g | PACK | Freq: Two times a day (BID) | ORAL | Status: DC
  Administered 2020-10-21 – 2020-10-22 (×2): 17 g via ORAL
  Filled 2020-10-21 (×2): qty 1

## 2020-10-21 MED ORDER — BISACODYL 10 MG RE SUPP: 10.0000 mg | Freq: Every day | RECTAL | Status: DC | PRN

## 2020-10-21 MED ORDER — SPIRONOLACTONE 25 MG PO TABS
50.0000 mg | ORAL_TABLET | Freq: Every day | ORAL | Status: DC
  Administered 2020-10-21 – 2020-10-22 (×2): 50 mg via ORAL
  Filled 2020-10-21 (×2): qty 2

## 2020-10-21 MED ORDER — ADULT MULTIVITAMIN W/MINERALS CH
1.0000 | ORAL_TABLET | Freq: Every day | ORAL | Status: DC
  Administered 2020-10-21 – 2020-10-22 (×2): 1 via ORAL
  Filled 2020-10-21 (×2): qty 1

## 2020-10-21 MED ORDER — POTASSIUM CHLORIDE CRYS ER 20 MEQ PO TBCR
40.0000 meq | EXTENDED_RELEASE_TABLET | Freq: Three times a day (TID) | ORAL | Status: DC
  Administered 2020-10-22 (×2): 40 meq via ORAL
  Filled 2020-10-21 (×2): qty 2

## 2020-10-21 NOTE — Addendum Note (Signed)
Addended by: Debbrah Alar on: 10/21/2020 10:04 AM   Modules accepted: Orders

## 2020-10-21 NOTE — Progress Notes (Signed)
PROGRESS NOTE    Nicole Quinn  TGG:[id number] DOB: 08-12-1975 DOA: 10/19/2020 PCP: Debbrah Alar, NP    Chief Complaint  Patient presents with   abnormal labs    Brief Narrative:  Nicole Quinn is a 45 y.o. female with medical history significant of chronic idiopathic hypokalemia typically managed with QID PO potassium and aldactone.   Pt with 1 month + of fatigue, body aches, headache, nausea. 1 week of decreased appetite, nausea, vomiting. Seen at PCPs office on 9/28, labs drawn revealing mild hypokalemia and mild LFT elevations at that time.   Seen at PCPs office again today, labs re-drawn which now show K 2.5, LFTs in the 200s.  Pt sent in to ED.   Had breast mass biopsied earlier this month but pathology came back benign.   Eating doesn't make abd symptoms worse really per pt.   Only taking 2 pills of tylenol a day at most per pt.   Subjective:  Reports feeling swollen Headache is better C/o constipation Husband at  bedside   Assessment & Plan:   Principal Problem:   Transaminitis Active Problems:   Chronic hypokalemia   Hypokalemia   Acute on chronic hypokalemia -acute hypokalemia likely due to n/v at home -replace k with iv and oral supplement, improving, d/c ivf, continue oral supplement, continue check k bid -d/c ivf due to reports being swollen  Chronic hypokalemia For many years Reports home potassium supplement regimen ( three 38meq tabs three times a day)  Also on spironolactone daily chronically, resume today  Lft Reports was on ""D herbs detox" which consists of 30 pills daily for a few weeks, stopped a week ago" Viral panel pending GI following   Constipation Stool softener      Body mass index is 29.7 kg/m.Marland Kitchen      Unresulted Labs (From admission, onward)     Start     Ordered   10/20/20 1015  Potassium  5A & 5P,   R (with TIMED occurrences)      10/20/20 1014   10/20/20 0218  CMV IgM  Once,   R        10/20/20 0219               DVT prophylaxis: enoxaparin (LOVENOX) injection 40 mg Start: 10/20/20 1000   Code Status:full Family Communication: patient and husband at bedside  Disposition:   Status is: Inpatient  Dispo: The patient is from: home              Anticipated d/c is to: home              Anticipated d/c date is: 24-48hrs pending k stabilization and lft trend, need gi clearance                 Consultants:  GI  Procedures:  none  Antimicrobials:   none     Objective: Vitals:   10/20/20 0934 10/20/20 1402 10/20/20 1459 10/20/20 1909  BP: 106/71 105/69 109/79 104/74  Pulse: 75 72 71 77  Resp: (!) 22 (!) 22 20 18   Temp: 98.5 F (36.9 C) 97.7 F (36.5 C) 98.3 F (36.8 C) 98 F (36.7 C)  TempSrc: Oral Oral  Oral  SpO2: 100% 100% 100% 100%  Weight:      Height:        Intake/Output Summary (Last 24 hours) at 10/21/2020 7035 Last data filed at 10/20/2020 2222 Gross per 24 hour  Intake 1470.77 ml  Output 500 ml  Net 970.77 ml   Filed Weights   10/19/20 1816 10/19/20 1936  Weight: 78.5 kg 78.5 kg    Examination:  General exam: calm, NAD Respiratory system: Clear to auscultation. Respiratory effort normal. Cardiovascular system: S1 & S2 heard, RRR. No JVD, no murmur, No pedal edema. Gastrointestinal system: Abdomen is nondistended, soft and nontender. No organomegaly or masses felt. Normal bowel sounds heard. Central nervous system: Alert and oriented. No focal neurological deficits. Extremities: Symmetric 5 x 5 power. Skin: No rashes, lesions or ulcers Psychiatry: Judgement and insight appear normal. Mood & affect appropriate.     Data Reviewed: I have personally reviewed following labs and imaging studies  CBC: Recent Labs  Lab 10/17/20 0813 10/19/20 1834  WBC 6.0 5.1  NEUTROABS 4.5 3.1  HGB 11.9* 11.8*  HCT 34.4* 33.3*  MCV 88.6 86.9  PLT 283.0 948    Basic Metabolic Panel: Recent Labs  Lab 10/17/20 0813 10/19/20 0742 10/19/20 1834  10/20/20 0458 10/20/20 1105 10/20/20 1709 10/21/20 0532  NA 129* 132* 135 136  --   --  139  K 3.3* 2.5* 2.4* 2.6* 2.6* 3.3* 3.8  CL 89* 86* 93* 103  --   --  106  CO2 30 32 28 26  --   --  27  GLUCOSE 91 95 102* 96  --   --  94  BUN 22 21 24* 14  --   --  11  CREATININE 1.74* 1.46* 1.27* 0.92  --   --  0.73  CALCIUM 9.6 9.9 9.8 8.9  --   --  9.1  MG  --   --  2.2  --   --   --   --     GFR: Estimated Creatinine Clearance: 91.9 mL/min (by C-G formula based on SCr of 0.73 mg/dL).  Liver Function Tests: Recent Labs  Lab 10/17/20 0813 10/19/20 0742 10/19/20 1834 10/20/20 0458 10/21/20 0532  AST 67* 220* 398* 269* 585*  ALT 50* 263* 340* 286* 477*  ALKPHOS 97 334* 370* 319* 388*  BILITOT 0.4 0.4 0.4 0.3 0.2*  PROT 7.7 8.2 8.2* 7.1 6.4*  ALBUMIN 4.3 4.5 4.0 3.4* 2.9*    CBG: No results for input(s): GLUCAP in the last 168 hours.   Recent Results (from the past 240 hour(s))  Resp Panel by RT-PCR (Flu A&B, Covid) Nasopharyngeal Swab     Status: None   Collection Time: 10/19/20 11:12 PM   Specimen: Nasopharyngeal Swab; Nasopharyngeal(NP) swabs in vial transport medium  Result Value Ref Range Status   SARS Coronavirus 2 by RT PCR NEGATIVE NEGATIVE Final    Comment: (NOTE) SARS-CoV-2 target nucleic acids are NOT DETECTED.  The SARS-CoV-2 RNA is generally detectable in upper respiratory specimens during the acute phase of infection. The lowest concentration of SARS-CoV-2 viral copies this assay can detect is 138 copies/mL. A negative result does not preclude SARS-Cov-2 infection and should not be used as the sole basis for treatment or other patient management decisions. A negative result may occur with  improper specimen collection/handling, submission of specimen other than nasopharyngeal swab, presence of viral mutation(s) within the areas targeted by this assay, and inadequate number of viral copies(<138 copies/mL). A negative result must be combined with clinical  observations, patient history, and epidemiological information. The expected result is Negative.  Fact Sheet for Patients:  EntrepreneurPulse.com.au  Fact Sheet for Healthcare Providers:  IncredibleEmployment.be  This test is no t yet approved or cleared by the [place] and  has been authorized for detection and/or diagnosis of SARS-CoV-2 by FDA under an Emergency Use Authorization (EUA). This EUA will remain  in effect (meaning this test can be used) for the duration of the COVID-19 declaration under Section 564(b)(1) of the Act, 21 U.S.C.section 360bbb-3(b)(1), unless the authorization is terminated  or revoked sooner.       Influenza A by PCR NEGATIVE NEGATIVE Final   Influenza B by PCR NEGATIVE NEGATIVE Final    Comment: (NOTE) The Xpert Xpress SARS-CoV-2/FLU/RSV plus assay is intended as an aid in the diagnosis of influenza from Nasopharyngeal swab specimens and should not be used as a sole basis for treatment. Nasal washings and aspirates are unacceptable for Xpert Xpress SARS-CoV-2/FLU/RSV testing.  Fact Sheet for Patients: EntrepreneurPulse.com.au  Fact Sheet for Healthcare Providers: IncredibleEmployment.be  This test is not yet approved or cleared by the [place] FDA and has been authorized for detection and/or diagnosis of SARS-CoV-2 by FDA under an Emergency Use Authorization (EUA). This EUA will remain in effect (meaning this test can be used) for the duration of the COVID-19 declaration under Section 564(b)(1) of the Act, 21 U.S.C. section 360bbb-3(b)(1), unless the authorization is terminated or revoked.  Performed at [facility], [place] [address], [place], [place] [address]          Radiology Studies: CT Abdomen Pelvis W Contrast  Result Date: 10/19/2020 CLINICAL DATA:  Abdominal infection suspected. EXAM: CT ABDOMEN AND PELVIS WITH CONTRAST  TECHNIQUE: Multidetector CT imaging of the abdomen and pelvis was performed using the standard protocol following bolus administration of intravenous contrast. CONTRAST:  93mL OMNIPAQUE IOHEXOL 350 MG/ML SOLN COMPARISON:  Abdominal ultrasound 09/04/2005. CT chest 04/10/2009. FINDINGS: Lower chest: There is atelectasis in the lung bases. Hepatobiliary: No focal liver abnormality is seen. No gallstones, gallbladder wall thickening, or biliary dilatation. Pancreas: Unremarkable. No pancreatic ductal dilatation or surrounding inflammatory changes. Spleen: Normal in size without focal abnormality. Adrenals/Urinary Tract: Bilateral adrenal glands are within normal limits. Stomach/Bowel: Small gastric fundal diverticulum is unchanged. Stomach is otherwise within normal limits. Appendix appears normal. No evidence of bowel wall thickening, distention, or inflammatory changes. Vascular/Lymphatic: No significant vascular findings are present. No enlarged abdominal or pelvic lymph nodes. Reproductive: Status post hysterectomy. No adnexal masses. Other: No abdominal wall hernia or abnormality. No abdominopelvic ascites. There is a small fat containing umbilical hernia. There is no ascites. Musculoskeletal: No acute or significant osseous findings. IMPRESSION: 1. No acute localizing process in the abdomen or pelvis. Electronically Signed   By: Ronney Asters M.D.   On: 10/19/2020 22:34   US Abdomen Limited RUQ (LIVER/GB)  Result Date: 10/20/2020 CLINICAL DATA:  Transaminitis. EXAM: ULTRASOUND ABDOMEN LIMITED RIGHT UPPER QUADRANT COMPARISON:  None. FINDINGS: Gallbladder: A 2 mm nonshadowing echogenic polyp is seen along the nondependent portion of the gallbladder wall. No gallstones or wall thickening visualized (2.0 mm). No sonographic Murphy sign noted by sonographer. Common bile duct: Diameter: 5.2 mm Liver: No focal lesion identified. Within normal limits in parenchymal echogenicity. Portal vein is patent on color Doppler  imaging with normal direction of blood flow towards the liver. Other: None. IMPRESSION: 2 mm gallbladder polyp, likely benign. No additional follow-up or imaging is recommended. This recommendation follows ACR consensus guidelines: White Paper of the ACR Incidental Findings Committee II on Gallbladder and Biliary Findings. J Am Coll Radiol 2013:;10:953-956. Electronically Signed   By: Virgina Norfolk M.D.   On: 10/20/2020 02:50        Scheduled Meds:  enoxaparin (LOVENOX) injection  40 mg Subcutaneous Q24H   polyethylene glycol  17 g Oral BID   potassium chloride SA  40 mEq Oral QID   senna-docusate  1 tablet Oral BID   Continuous Infusions:  sodium chloride 500 mL (10/20/20 1047)   methocarbamol (ROBAXIN) IV       LOS: 1 day   Time spent: 39mins Greater than 50% of this time was spent in counseling, explanation of diagnosis, planning of further management, and coordination of care.   Voice Recognition Viviann Spare dictation system was used to create this note, attempts have been made to correct errors. Please contact the author with questions and/or clarifications.   Florencia Reasons, MD PhD FACP [facility]  Available via Epic secure chat 7am-7pm for nonurgent issues Please page for urgent issues To page the attending provider between 7A-7P or the covering provider during after hours 7P-7A, please log into the web site [email or web address] and access using universal [place] password for that web site. If you do not have the password, please call the hospital operator.    10/21/2020, 9:52 AM

## 2020-10-21 NOTE — Telephone Encounter (Signed)
Noted  

## 2020-10-22 ENCOUNTER — Ambulatory Visit (HOSPITAL_BASED_OUTPATIENT_CLINIC_OR_DEPARTMENT_OTHER): Payer: BC Managed Care – PPO

## 2020-10-22 ENCOUNTER — Other Ambulatory Visit: Payer: Self-pay

## 2020-10-22 ENCOUNTER — Encounter: Payer: Self-pay | Admitting: Family

## 2020-10-22 DIAGNOSIS — R7401 Elevation of levels of liver transaminase levels: Secondary | ICD-10-CM

## 2020-10-22 DIAGNOSIS — E876 Hypokalemia: Secondary | ICD-10-CM

## 2020-10-22 DIAGNOSIS — R7989 Other specified abnormal findings of blood chemistry: Secondary | ICD-10-CM

## 2020-10-22 LAB — PROTIME-INR
INR: 1 (ref 0.8–1.2)
Prothrombin Time: 13.6 seconds (ref 11.4–15.2)

## 2020-10-22 LAB — CMV IGM: CMV IgM: <30 AU/mL (ref 0.0–29.9)

## 2020-10-22 MED ORDER — SUMATRIPTAN SUCCINATE 6 MG/0.5ML ~~LOC~~ SOLN
6.0000 mg | SUBCUTANEOUS | Status: DC | PRN
  Administered 2020-10-22: 6 mg via SUBCUTANEOUS
  Filled 2020-10-22 (×2): qty 0.5

## 2020-10-22 MED ORDER — SENNOSIDES-DOCUSATE SODIUM 8.6-50 MG PO TABS: 1.0000 | ORAL_TABLET | Freq: Every day | ORAL | 0 refills | Status: AC

## 2020-10-22 MED ORDER — SPIRONOLACTONE 50 MG PO TABS: 50.0000 mg | ORAL_TABLET | Freq: Every day | ORAL | 2 refills | Status: DC

## 2020-10-22 NOTE — Discharge Instructions (Signed)
You  are scheduled for follow up appt with Santina Boisclair  on 11/06/20 at 10:30 am - 3rd floor [place]

## 2020-10-22 NOTE — Progress Notes (Addendum)
PROGRESS NOTE    Nicole Quinn  ZJI:[id number] DOB: 1975/07/16 DOA: 10/19/2020 PCP: Debbrah Alar, NP    Brief Narrative:  Nicole Quinn is a 45 y.o. female with past medical history significant for chronic idiopathic hypokalemia typically managed with QID PO potassium and aldactone [facility] with 1 month history of fatigue body aches headache and nausea with vomiting.  Patient was in the primary care office initially which showed LFT elevation with hypokalemia.  Patient had been taking herbal detox as outpatient. Patient was then admitted hospital for electrolyte imbalance and elevated LFT.  Assessment & Plan:   Principal Problem:   Transaminitis Active Problems:   Chronic hypokalemia   Hypokalemia   Acute on chronic idiopathic hypokalemia Exacerbated due to nausea and vomiting at home.  Continue with potassium supplements.  Improving potassium levels but appears to be little puffy but reluctant to use a diuretic due to significant hypokalemia at baseline.  At baseline patient takes three 20 mEq KCl 3 times a day with the spironolactone daily.  Aldactone on hold due to potential for hepatotoxicity.  Elevated LFTs. Patient was reportedly on"D herbs detox" which consists of 30 pills daily for a few weeks, stopped a week ago" GI was consulted due to elevated LFT.  At this time INR is stable.  We will continue to monitor but LFTs trending up.  Multiple liver labs have been sent including CMV EBV.  Hepatitis panel negative.  ANA negative.  Aldactone has been on hold as per GI.  Subjective edema puffiness.  Patient feels concerned about it.  Overall positive balance for 1210 mL.    Constipation Continue stool softener    DVT prophylaxis: enoxaparin (LOVENOX) injection 40 mg Start: 10/20/20 1000   Code Status:full code  Family Communication:  Spoke with the patient's husband at bedside  Disposition:   Status is: Inpatient  Dispo: The patient is from: home               Anticipated d/c is to: home              Anticipated d/c date is: Likely tomorrow if patient feels symptomatically better and LFTs are stable.               Consultants:  GI  Procedures:  none  Antimicrobials:   none   Subjective: Today, patient states that she feels more puffy and edematous.  Headache is present but somewhat better.  Husband at bedside  Objective: Vitals:   10/21/20 2027 10/22/20 0456 10/22/20 1214 10/22/20 1300  BP: 110/79 112/79 96/71 107/81  Pulse: 77 77 85 76  Resp: (!) 21 17    Temp: 98.4 F (36.9 C) 99.1 F (37.3 C)    TempSrc: Oral     SpO2: 100% 99% 100%   Weight:      Height:        Intake/Output Summary (Last 24 hours) at 10/22/2020 1334 Last data filed at 10/21/2020 1846 Gross per 24 hour  Intake 240 ml  Output --  Net 240 ml    Filed Weights   10/19/20 1816 10/19/20 1936  Weight: 78.5 kg 78.5 kg    Physical examination: General:  Average built, not in obvious distress HENT:   No scleral pallor or icterus noted. Oral mucosa is moist.  Chest:  Clear breath sounds.  Diminished breath sounds bilaterally. No crackles or wheezes.  CVS: S1 &S2 heard. No murmur.  Regular rate and rhythm. Abdomen: Soft, nontender, nondistended.  Bowel sounds are  heard.   Extremities: No cyanosis, clubbing with trace edema.  Peripheral pulses are palpable. Psych: Alert, awake and oriented, normal mood CNS:  No cranial nerve deficits.  Power equal in all extremities.   Skin: Warm and dry.  No rashes noted.  Puffiness over the extremities and face.  Data Reviewed: I have personally reviewed following labs and imaging studies  CBC: Recent Labs  Lab 10/17/20 0813 10/19/20 1834  WBC 6.0 5.1  NEUTROABS 4.5 3.1  HGB 11.9* 11.8*  HCT 34.4* 33.3*  MCV 88.6 86.9  PLT 283.0 333     Basic Metabolic Panel: Recent Labs  Lab 10/17/20 0813 10/19/20 0742 10/19/20 1834 10/20/20 0458 10/20/20 1105 10/20/20 1709 10/21/20 0532 10/21/20 1657  NA 129*  132* 135 136  --   --  139  --   K 3.3* 2.5* 2.4* 2.6* 2.6* 3.3* 3.8 4.0  CL 89* 86* 93* 103  --   --  106  --   CO2 30 32 28 26  --   --  27  --   GLUCOSE 91 95 102* 96  --   --  94  --   BUN 22 21 24* 14  --   --  11  --   CREATININE 1.74* 1.46* 1.27* 0.92  --   --  0.73  --   CALCIUM 9.6 9.9 9.8 8.9  --   --  9.1  --   MG  --   --  2.2  --   --   --   --   --      GFR: Estimated Creatinine Clearance: 91.9 mL/min (by C-G formula based on SCr of 0.73 mg/dL).  Liver Function Tests: Recent Labs  Lab 10/17/20 0813 10/19/20 0742 10/19/20 1834 10/20/20 0458 10/21/20 0532  AST 67* 220* 398* 269* 585*  ALT 50* 263* 340* 286* 477*  ALKPHOS 97 334* 370* 319* 388*  BILITOT 0.4 0.4 0.4 0.3 0.2*  PROT 7.7 8.2 8.2* 7.1 6.4*  ALBUMIN 4.3 4.5 4.0 3.4* 2.9*     CBG: No results for input(s): GLUCAP in the last 168 hours.   Recent Results (from the past 240 hour(s))  Resp Panel by RT-PCR (Flu A&B, Covid) Nasopharyngeal Swab     Status: None   Collection Time: 10/19/20 11:12 PM   Specimen: Nasopharyngeal Swab; Nasopharyngeal(NP) swabs in vial transport medium  Result Value Ref Range Status   SARS Coronavirus 2 by RT PCR NEGATIVE NEGATIVE Final    Comment: (NOTE) SARS-CoV-2 target nucleic acids are NOT DETECTED.  The SARS-CoV-2 RNA is generally detectable in upper respiratory specimens during the acute phase of infection. The lowest concentration of SARS-CoV-2 viral copies this assay can detect is 138 copies/mL. A negative result does not preclude SARS-Cov-2 infection and should not be used as the sole basis for treatment or other patient management decisions. A negative result may occur with  improper specimen collection/handling, submission of specimen other than nasopharyngeal swab, presence of viral mutation(s) within the areas targeted by this assay, and inadequate number of viral copies(<138 copies/mL). A negative result must be combined with clinical observations, patient  history, and epidemiological information. The expected result is Negative.  Fact Sheet for Patients:  EntrepreneurPulse.com.au  Fact Sheet for Healthcare Providers:  IncredibleEmployment.be  This test is no t yet approved or cleared by the [place] FDA and  has been authorized for detection and/or diagnosis of SARS-CoV-2 by FDA under an Emergency Use Authorization (EUA). This EUA will remain  in effect (meaning this test can be used) for the duration of the COVID-19 declaration under Section 564(b)(1) of the Act, 21 U.S.C.section 360bbb-3(b)(1), unless the authorization is terminated  or revoked sooner.       Influenza A by PCR NEGATIVE NEGATIVE Final   Influenza B by PCR NEGATIVE NEGATIVE Final    Comment: (NOTE) The Xpert Xpress SARS-CoV-2/FLU/RSV plus assay is intended as an aid in the diagnosis of influenza from Nasopharyngeal swab specimens and should not be used as a sole basis for treatment. Nasal washings and aspirates are unacceptable for Xpert Xpress SARS-CoV-2/FLU/RSV testing.  Fact Sheet for Patients: EntrepreneurPulse.com.au  Fact Sheet for Healthcare Providers: IncredibleEmployment.be  This test is not yet approved or cleared by the [place] FDA and has been authorized for detection and/or diagnosis of SARS-CoV-2 by FDA under an Emergency Use Authorization (EUA). This EUA will remain in effect (meaning this test can be used) for the duration of the COVID-19 declaration under Section 564(b)(1) of the Act, 21 U.S.C. section 360bbb-3(b)(1), unless the authorization is terminated or revoked.  Performed at [facility], [place] [address] [place], [place] [address]       Radiology Studies: No results found.  Scheduled Meds:  enoxaparin (LOVENOX) injection  40 mg Subcutaneous Q24H   multivitamin with minerals  1 tablet Oral Daily   polyethylene glycol  17 g  Oral BID   potassium chloride SA  40 mEq Oral TID   senna-docusate  1 tablet Oral BID   Continuous Infusions:  sodium chloride 500 mL (10/20/20 1047)     LOS: 2 days  Nicole Lipps, MD  [facility] 10/22/2020, 1:34 PM

## 2020-10-22 NOTE — Discharge Summary (Signed)
Physician Discharge Summary  Nicole Quinn IOE:[id number] DOB: [birth year] DOA: 10/19/2020  PCP: Debbrah Alar, NP  Admit date: 10/19/2020 Discharge date: 10/22/2020  Admitted From: Home  Discharge disposition: Home   Recommendations for Outpatient Follow-Up:   Follow up with your primary care provider in one week.  Check CBC, BMP, magnesium in the next visit Aldactone on hold due to elevated LFT.  Check LFTs in the next visit and consider Aldactone if necessary. Follow-up with GI as outpatient on 11/06/2020.  Discharge Diagnosis:   Principal Problem:   Transaminitis Active Problems:   Chronic hypokalemia   Hypokalemia  Discharge Condition: Improved.  Diet recommendation:  Regular.  Wound care: None.  Code status: Full.   History of Present Illness:   Nicole Quinn is a 45 y.o. female with past medical history significant for chronic idiopathic hypokalemia typically managed with QID PO potassium and aldactone [name] with 1 month history of fatigue body aches headache and nausea with vomiting.  Patient was in the primary care office initially which showed LFT elevation with hypokalemia.  Patient had been taking herbal detox as outpatient. Patient was then admitted hospital for electrolyte imbalance and elevated LFT.  Hospital Course:   Following conditions were addressed during hospitalization as listed below,  Acute on chronic idiopathic hypokalemia Exacerbated due to nausea and vomiting at home.  Continue with potassium supplements on discharge.  Improving potassium levels but appears to be little puffy with mild positive fluid balance..  At baseline patient takes three 20 mEq KCl 3 times a day with the spironolactone daily.  Aldactone on hold due to potential for hepatotoxicity per GI.  Spoke with the patient about holding Aldactone until next PCP/GI visit..   Elevated LFTs. Patient was reportedly on"D herbs detox" which consists of 30 pills daily for  a few weeks, stopped a week ago" GI was consulted due to elevated LFT.  At this time INR is stable.   Multiple liver labs have been sent including CMV EBV.  Hepatitis panel negative.  ANA negative.  Aldactone has been on hold as per GI.  Patient has an appointment to follow-up with GI as outpatient.   Subjective edema puffiness.  Patient feels concerned about it.  Overall positive balance for 1210 mL.     Constipation Continue stool softener on discharge.  Disposition.  At this time, patient is stable for disposition home with outpatient PCP and GI follow-up.  Spoke with the patient's husband at bedside  Medical Consultants:   GI  Procedures:    None Subjective:   Today, patient states that she feels more puffy but denies any pain, nausea, vomiting, fever or chills.  Discharge Exam:   Vitals:   10/22/20 1214 10/22/20 1300  BP: 96/71 107/81  Pulse: 85 76  Resp:    Temp:    SpO2: 100%    Vitals:   10/21/20 2027 10/22/20 0456 10/22/20 1214 10/22/20 1300  BP: 110/79 112/79 96/71 107/81  Pulse: 77 77 85 76  Resp: (!) 21 17    Temp: 98.4 F (36.9 C) 99.1 F (37.3 C)    TempSrc: Oral     SpO2: 100% 99% 100%   Weight:      Height:      General:  Average built, not in obvious distress HENT:   No scleral pallor or icterus noted. Oral mucosa is moist.  Chest:  Clear breath sounds.  Diminished breath sounds bilaterally. No crackles or wheezes.  CVS: S1 &S2 heard. No murmur.  Regular rate and rhythm. Abdomen: Soft, nontender, nondistended.  Bowel sounds are heard.   Extremities: No cyanosis, clubbing with trace edema.  Peripheral pulses are palpable. Psych: Alert, awake and oriented, normal mood CNS:  No cranial nerve deficits.  Power equal in all extremities.   Skin: Warm and dry.  No rashes noted.  Puffiness over the extremities and face  The results of significant diagnostics from this hospitalization (including imaging, microbiology, ancillary and laboratory) are listed  below for reference.     Diagnostic Studies:   CT Abdomen Pelvis W Contrast  Result Date: 10/19/2020 CLINICAL DATA:  Abdominal infection suspected. EXAM: CT ABDOMEN AND PELVIS WITH CONTRAST TECHNIQUE: Multidetector CT imaging of the abdomen and pelvis was performed using the standard protocol following bolus administration of intravenous contrast. CONTRAST:  45mL OMNIPAQUE IOHEXOL 350 MG/ML SOLN COMPARISON:  Abdominal ultrasound 09/04/2005. CT chest 04/10/2009. FINDINGS: Lower chest: There is atelectasis in the lung bases. Hepatobiliary: No focal liver abnormality is seen. No gallstones, gallbladder wall thickening, or biliary dilatation. Pancreas: Unremarkable. No pancreatic ductal dilatation or surrounding inflammatory changes. Spleen: Normal in size without focal abnormality. Adrenals/Urinary Tract: Bilateral adrenal glands are within normal limits. Stomach/Bowel: Small gastric fundal diverticulum is unchanged. Stomach is otherwise within normal limits. Appendix appears normal. No evidence of bowel wall thickening, distention, or inflammatory changes. Vascular/Lymphatic: No significant vascular findings are present. No enlarged abdominal or pelvic lymph nodes. Reproductive: Status post hysterectomy. No adnexal masses. Other: No abdominal wall hernia or abnormality. No abdominopelvic ascites. There is a small fat containing umbilical hernia. There is no ascites. Musculoskeletal: No acute or significant osseous findings. IMPRESSION: 1. No acute localizing process in the abdomen or pelvis. Electronically Signed   By: Ronney Asters M.D.   On: 10/19/2020 22:34   US Abdomen Limited RUQ (LIVER/GB)  Result Date: 10/20/2020 CLINICAL DATA:  Transaminitis. EXAM: ULTRASOUND ABDOMEN LIMITED RIGHT UPPER QUADRANT COMPARISON:  None. FINDINGS: Gallbladder: A 2 mm nonshadowing echogenic polyp is seen along the nondependent portion of the gallbladder wall. No gallstones or wall thickening visualized (2.0 mm). No  sonographic Murphy sign noted by sonographer. Common bile duct: Diameter: 5.2 mm Liver: No focal lesion identified. Within normal limits in parenchymal echogenicity. Portal vein is patent on color Doppler imaging with normal direction of blood flow towards the liver. Other: None. IMPRESSION: 2 mm gallbladder polyp, likely benign. No additional follow-up or imaging is recommended. This recommendation follows ACR consensus guidelines: White Paper of the ACR Incidental Findings Committee II on Gallbladder and Biliary Findings. J Am Coll Radiol 2013:;10:953-956. Electronically Signed   By: Virgina Norfolk M.D.   On: 10/20/2020 02:50     Labs:   Basic Metabolic Panel: Recent Labs  Lab 10/17/20 0813 10/19/20 0742 10/19/20 1834 10/20/20 0458 10/20/20 1105 10/21/20 0532 10/21/20 1657  NA 129* 132* 135 136  --  139  --   K 3.3* 2.5* 2.4* 2.6*   < > 3.8 4.0  CL 89* 86* 93* 103  --  106  --   CO2 30 32 28 26  --  27  --   GLUCOSE 91 95 102* 96  --  94  --   BUN 22 21 24* 14  --  11  --   CREATININE 1.74* 1.46* 1.27* 0.92  --  0.73  --   CALCIUM 9.6 9.9 9.8 8.9  --  9.1  --   MG  --   --  2.2  --   --   --   --    < > =  values in this interval not displayed.   GFR Estimated Creatinine Clearance: 91.9 mL/min (by C-G formula based on SCr of 0.73 mg/dL). Liver Function Tests: Recent Labs  Lab 10/17/20 0813 10/19/20 0742 10/19/20 1834 10/20/20 0458 10/21/20 0532  AST 67* 220* 398* 269* 585*  ALT 50* 263* 340* 286* 477*  ALKPHOS 97 334* 370* 319* 388*  BILITOT 0.4 0.4 0.4 0.3 0.2*  PROT 7.7 8.2 8.2* 7.1 6.4*  ALBUMIN 4.3 4.5 4.0 3.4* 2.9*   No results for input(s): LIPASE, AMYLASE in the last 168 hours. No results for input(s): AMMONIA in the last 168 hours. Coagulation profile Recent Labs  Lab 10/20/20 0457 10/21/20 0532 10/22/20 1021  INR 1.0 1.0 1.0    CBC: Recent Labs  Lab 10/17/20 0813 10/19/20 1834  WBC 6.0 5.1  NEUTROABS 4.5 3.1  HGB 11.9* 11.8*  HCT 34.4* 33.3*   MCV 88.6 86.9  PLT 283.0 333   Cardiac Enzymes: Recent Labs  Lab 10/19/20 0742  CKTOTAL 38   BNP: Invalid input(s): POCBNP CBG: No results for input(s): GLUCAP in the last 168 hours. D-Dimer No results for input(s): DDIMER in the last 72 hours. Hgb A1c No results for input(s): HGBA1C in the last 72 hours. Lipid Profile No results for input(s): CHOL, HDL, LDLCALC, TRIG, CHOLHDL, LDLDIRECT in the last 72 hours. Thyroid function studies No results for input(s): TSH, T4TOTAL, T3FREE, THYROIDAB in the last 72 hours.  Invalid input(s): FREET3 Anemia work up No results for input(s): VITAMINB12, FOLATE, FERRITIN, TIBC, IRON, RETICCTPCT in the last 72 hours. Microbiology Recent Results (from the past 240 hour(s))  Resp Panel by RT-PCR (Flu A&B, Covid) Nasopharyngeal Swab     Status: None   Collection Time: 10/19/20 11:12 PM   Specimen: Nasopharyngeal Swab; Nasopharyngeal(NP) swabs in vial transport medium  Result Value Ref Range Status   SARS Coronavirus 2 by RT PCR NEGATIVE NEGATIVE Final    Comment: (NOTE) SARS-CoV-2 target nucleic acids are NOT DETECTED.  The SARS-CoV-2 RNA is generally detectable in upper respiratory specimens during the acute phase of infection. The lowest concentration of SARS-CoV-2 viral copies this assay can detect is 138 copies/mL. A negative result does not preclude SARS-Cov-2 infection and should not be used as the sole basis for treatment or other patient management decisions. A negative result may occur with  improper specimen collection/handling, submission of specimen other than nasopharyngeal swab, presence of viral mutation(s) within the areas targeted by this assay, and inadequate number of viral copies(<138 copies/mL). A negative result must be combined with clinical observations, patient history, and epidemiological information. The expected result is Negative.  Fact Sheet for Patients:  EntrepreneurPulse.com.au  Fact  Sheet for Healthcare Providers:  IncredibleEmployment.be  This test is no t yet approved or cleared by the [place] FDA and  has been authorized for detection and/or diagnosis of SARS-CoV-2 by FDA under an Emergency Use Authorization (EUA). This EUA will remain  in effect (meaning this test can be used) for the duration of the COVID-19 declaration under Section 564(b)(1) of the Act, 21 U.S.C.section 360bbb-3(b)(1), unless the authorization is terminated  or revoked sooner.       Influenza A by PCR NEGATIVE NEGATIVE Final   Influenza B by PCR NEGATIVE NEGATIVE Final    Comment: (NOTE) The Xpert Xpress SARS-CoV-2/FLU/RSV plus assay is intended as an aid in the diagnosis of influenza from Nasopharyngeal swab specimens and should not be used as a sole basis for treatment. Nasal washings and aspirates are unacceptable for  Xpert Xpress SARS-CoV-2/FLU/RSV testing.  Fact Sheet for Patients: EntrepreneurPulse.com.au  Fact Sheet for Healthcare Providers: IncredibleEmployment.be  This test is not yet approved or cleared by the [place] FDA and has been authorized for detection and/or diagnosis of SARS-CoV-2 by FDA under an Emergency Use Authorization (EUA). This EUA will remain in effect (meaning this test can be used) for the duration of the COVID-19 declaration under Section 564(b)(1) of the Act, 21 U.S.C. section 360bbb-3(b)(1), unless the authorization is terminated or revoked.  Performed at [facility], [place] [address], [place], [place] [address]      Discharge Instructions:   Discharge Instructions     Call MD for:  persistant nausea and vomiting   Complete by: As directed    Call MD for:  severe uncontrolled pain   Complete by: As directed    Call MD for:  temperature >100.4   Complete by: As directed    Diet general   Complete by: As directed    Discharge instructions   Complete by: As  directed    Follow-up with your primary care physician in 1 week.  Check liver function and potassium levels test at that time.  Not take Aldactone until liver function test is normal.  Follow up with the GI as outpatient for liver follow-up as has been scheduled.  Do not take herbal supplements.  No alcohol or Tylenol for now.  Seek medical attention for worsening symptoms including jaundice, confusion.   Increase activity slowly   Complete by: As directed       Allergies as of 10/22/2020       Reactions   Amoxicillin    rash   Ace Inhibitors    REACTION: lip swelling        Medication List     TAKE these medications    methocarbamol 500 MG tablet Commonly known as: Robaxin Take 1 tablet (500 mg total) by mouth every 8 (eight) hours as needed for muscle spasms.   multivitamin tablet Take 1 tablet by mouth daily.   potassium chloride SA 20 MEQ tablet Commonly known as: KLOR-CON Take 2 tablets (40 mEq total) by mouth in the morning, at noon, in the evening, and at bedtime.   senna-docusate 8.6-50 MG tablet Commonly known as: Senokot-S Take 1 tablet by mouth daily.   spironolactone 50 MG tablet Commonly known as: ALDACTONE Take 1 tablet (50 mg total) by mouth daily. Do not take until liver function is normal, check with your primary physician Start taking on: October 29, 2020 What changed:  additional instructions These instructions start on October 29, 2020. If you are unsure what to do until then, ask your doctor or other care provider.          Time coordinating discharge: 39 minutes  Signed:  Shalom Ware  [facility] 10/22/2020, 4:31 PM

## 2020-10-22 NOTE — Progress Notes (Addendum)
Patient ID: Nicole Quinn, female   DOB: 03/10/1975, 45 y.o.   MRN: [id number]    Progress Note   Subjective  Day # 3 CC: transaminitis, fatigue, body aches, headache nausea  Labs 10/21/2020-potassium 3.8 T bili 0.2/alk phos 388/ALT 477/AST 585 INR 1.0 on admit Respiratory panel negative CMV IgM pending EBV VCA IgM negative/EBV VCA IgG positive Acute hepatitis panel-ABC negative ANA negative  Patient says she feels okay, still some abdominal tenderness, she is eating solid food without difficulty, no nausea or vomiting.  She is concerned because she has started retaining fluid.  She says she has done this in the past and has required treatment for this in the past though has not had good explanation as to the etiology.   Objective   Vital signs in last 24 hours: Temp:  [98.4 F (36.9 C)-99.1 F (37.3 C)] 99.1 F (37.3 C) (10/03 0456) Pulse Rate:  [73-77] 77 (10/03 0456) Resp:  [17-21] 17 (10/03 0456) BP: (110-112)/(79) 112/79 (10/03 0456) SpO2:  [99 %-100 %] 99 % (10/03 0456) Last BM Date: 10/13/20 General:    AA female in NAD Heart:  Regular rate and rhythm; no murmurs Lungs: Respirations even and unlabored, lungs CTA bilaterally Abdomen:  Soft, minimally tender in the upper abdomen, no hepatomegaly nondistended. Normal bowel sounds. Extremities: Trace to 1+ edema feet/ankles hands Neurologic:  Alert and oriented,  grossly normal neurologically. Psych:  Cooperative. Normal mood and affect.  Intake/Output from previous day: 10/02 0701 - 10/03 0700 In: 240 [P.O.:240] Out: -  Intake/Output this shift: No intake/output data recorded.  Lab Results: Recent Labs    10/19/20 1834  WBC 5.1  HGB 11.8*  HCT 33.3*  PLT 333   BMET Recent Labs    10/19/20 1834 10/20/20 0458 10/20/20 1105 10/20/20 1709 10/21/20 0532 10/21/20 1657  NA 135 136  --   --  139  --   K 2.4* 2.6*   < > 3.3* 3.8 4.0  CL 93* 103  --   --  106  --   CO2 28 26  --   --  27  --   GLUCOSE 102*  96  --   --  94  --   BUN 24* 14  --   --  11  --   CREATININE 1.27* 0.92  --   --  0.73  --   CALCIUM 9.8 8.9  --   --  9.1  --    < > = values in this interval not displayed.   LFT Recent Labs    10/21/20 0532  PROT 6.4*  ALBUMIN 2.9*  AST 585*  ALT 477*  ALKPHOS 388*  BILITOT 0.2*   PT/INR Recent Labs    10/20/20 0457 10/21/20 0532  LABPROT 13.4 13.1  INR 1.0 1.0    Studies/Results: No results found.     Assessment / Plan:    #1 45-year-old African-American female with acute transaminitis CT and ultrasound show no hepatic abnormality Viral serologies negative ANA negative  Suspect DILI (drug-induced liver injury) secondary to the herbal detox program she was on. I believe she was also taking Aldactone as an outpatient-this does have potential for hepatotoxicity, would discontinue LFTs are stable but has not continue to trend down today  #2 chronic idiopathic hypokalemia #3 1 month history of fatigue, body aches, headache and nausea decreased appetite-possibly secondary to above #4 mild peripheral edema  Plan:  Repeat INR/protime Stop Aldactone Will send off ferritin, alpha-1 antitrypsin, ceruloplasmin, antimitochondrial   antibody and smooth muscle antibody If INR stable today, should be okay from a GI perspective to be discharged home.  She will need to be followed closely as an outpatient, with plan for LFTs on Thursday, then may need to follow weekly, to resolution.   Outpt appt scheduled with Amy Esterwood PA-C at 10:30 am on 11/06/20    LOS: 2 days   Amy Esterwood PA-C 10/22/2020, 8:40 AM     Attending Physician Note   I have taken an interval history, reviewed the chart and examined the patient. I personally saw the patient and performed a substantive portion of this encounter, including a complete performance of at least one of the key components, in conjunction with the APP. I agree with the APP's note, impression and recommendations.    Suspected DILI. Potential causes are Dherbs Detox, Aldactone so would stop both. LFTs increased yesterday. PT/INR is stable today. Send hepatic serologies as above to evaluate for other causes. Should be ok for discharge with close outpatient monitoring of LFTs, PT/INR. GI follow up with Dan Jacobs, MD and Amy Esterwood, PA-C.     Malcolm Stark, MD FACG See AMION, [facility], for our on call provider    

## 2020-10-22 NOTE — Progress Notes (Signed)
This shift pt given imitrex for headache  that she reported 10/10. Stated earlier toradol had not relieved on the day shift. No relief 1 hour after giving medication, but refused any other meds. Administered toradol at 0600 at pts request. Will continue to monitor

## 2020-10-23 ENCOUNTER — Telehealth: Payer: Self-pay | Admitting: Family

## 2020-10-23 LAB — MITOCHONDRIAL ANTIBODIES: Mitochondrial M2 Ab, IgG: <20 Units (ref 0.0–20.0)

## 2020-10-23 LAB — CMV (CYTOMEGALOVIRUS) DNA ULTRAQUANT, PCR
CMV DNA, QN PCR: NOT DETECTED log IU/mL
CMV DNA, QN Real Time PCR: NOT DETECTED IU/mL

## 2020-10-23 LAB — CMV ABS, IGG+IGM (CYTOMEGALOVIRUS)
CMV IgM: <30 AU/mL
Cytomegalovirus Ab-IgG: >10 U/mL — ABNORMAL HIGH

## 2020-10-23 LAB — ALPHA-1-ANTITRYPSIN: A-1 Antitrypsin, Ser: 219 mg/dL — ABNORMAL HIGH (ref 101–187)

## 2020-10-23 LAB — ANTI-SMOOTH MUSCLE ANTIBODY, IGG: F-Actin IgG: 20 Units — ABNORMAL HIGH (ref 0–19)

## 2020-10-23 LAB — HIV ANTIBODY (ROUTINE TESTING W REFLEX): HIV 1&2 Ab, 4th Generation: NONREACTIVE

## 2020-10-23 LAB — HIV-1 RNA, QUALITATIVE, TMA: HIV-1 RNA, Qualitative, TMA: NOT DETECTED

## 2020-10-23 LAB — CERULOPLASMIN: Ceruloplasmin: 38.9 mg/dL (ref 19.0–39.0)

## 2020-10-23 NOTE — Telephone Encounter (Signed)
Please contact patient to schedule a hospital follow up with me tomorrow or Friday.

## 2020-10-23 NOTE — Telephone Encounter (Signed)
Appointment was moved to Friday.

## 2020-10-25 ENCOUNTER — Telehealth: Payer: Self-pay

## 2020-10-25 NOTE — Telephone Encounter (Signed)
Transition Care Management Unsuccessful Follow-up Telephone Call  Date of discharge and from where:  10/22/2020 / [facility]   Attempts:  1st Attempt  Reason for unsuccessful TCM follow-up call:  Left voice message  Quinn Plowman RN,BSN,CCM [place] [phone]

## 2020-10-26 ENCOUNTER — Ambulatory Visit: Payer: BC Managed Care – PPO | Admitting: Family

## 2020-10-26 ENCOUNTER — Other Ambulatory Visit: Payer: Self-pay

## 2020-10-26 VITALS — BP 123/73 | HR 92 | Temp 98.0°F | Resp 16 | Wt 188.0 lb

## 2020-10-26 DIAGNOSIS — E876 Hypokalemia: Secondary | ICD-10-CM

## 2020-10-26 DIAGNOSIS — N19 Unspecified kidney failure: Secondary | ICD-10-CM | POA: Diagnosis not present

## 2020-10-26 DIAGNOSIS — M2550 Pain in unspecified joint: Secondary | ICD-10-CM

## 2020-10-26 DIAGNOSIS — R300 Dysuria: Secondary | ICD-10-CM

## 2020-10-26 DIAGNOSIS — R519 Headache, unspecified: Secondary | ICD-10-CM | POA: Insufficient documentation

## 2020-10-26 DIAGNOSIS — R7401 Elevation of levels of liver transaminase levels: Secondary | ICD-10-CM | POA: Diagnosis not present

## 2020-10-26 DIAGNOSIS — N179 Acute kidney failure, unspecified: Secondary | ICD-10-CM

## 2020-10-26 DIAGNOSIS — I10 Essential (primary) hypertension: Secondary | ICD-10-CM

## 2020-10-26 DIAGNOSIS — R609 Edema, unspecified: Secondary | ICD-10-CM

## 2020-10-26 HISTORY — DX: Acute kidney failure, unspecified: N17.9

## 2020-10-26 MED ORDER — TRAMADOL HCL 50 MG PO TABS: 50.0000 mg | ORAL_TABLET | Freq: Four times a day (QID) | ORAL | 0 refills | Status: AC | PRN

## 2020-10-26 NOTE — Assessment & Plan Note (Signed)
Improved. I suspect that this was reactive in the setting of large amounts of supplements, ARF and transaminitis.  Advised her OK to hold off on rheumatology referral.

## 2020-10-26 NOTE — Assessment & Plan Note (Signed)
Last cr was wnl prior to discharge. Diuretics remain on hold for now.  Need to re-evaluate LFT's before considering restarting diuretics.

## 2020-10-26 NOTE — Progress Notes (Addendum)
Subjective:   By signing my name below, I, Lyric Barr-McArthur, attest that this documentation has been prepared under the direction and in the presence of Sandford Craze, NP, 10/26/2020   Patient ID: Nicole Quinn, female    DOB: 05-26-1975, 45 y.o.   MRN: [id number]  Chief Complaint  Patient presents with   Follow-up    Here for hospital follow up    HPI Patient is in today for a hospital follow up visit. Her husband is present in the room with her during this visit.   ED visit: She was hospitalized for kidney failure, severe hypokalemia and transaminitis felt most likely due to use of OTC herbal detox supplements. The patient is skeptical that this was the preciptating cause. Since she has been discharged she has had painful urination at the end of her stream. She feels like she has a UTI. With her discharge she needs her kidney, liver and potassium checked today.  Potassium levels: She was taking 50 mg aldactone and 40 mg potassium supplement  TID prior to admission to help maintain her potassium levels. Due to her kidney failure/transaminitis, her aldactone was d/c'd in the hospital. She has had worsening LE edema since that time. She has seen a nephrologist prior to this entire episode where she was advised to continue the potassium supplement. They did not find an obvious cause for her hypokalemia. She now feels like basic exertion with working out makes her feel like her potassium levels are low and complains that this takes week to return to a normal feeling. Her husband expresses frustration with lack of "cause" found for her hypokalemia and how her hypokalemia impacts her activities and quality of life.  Decreased urination: Since stopping the use of aldactone, she has experienced urination only 2-3 times a day which is abnormal for her.    Health Maintenance Due  Topic Date Due   COVID-19 Vaccine (3 - Mixed Product risk series) 11/19/2019    Past Medical History:   Diagnosis Date   Allergy    Anemia    Endometriosis    History of echocardiogram    Echo 6/18: EF 60-65, no RWMA, normal diastolic function   History of nuclear stress test    Myoview 6/18: EF 60, normal perfusion, Low Risk   History of UTI    Hypertension    pregnancy induced HTN/pre-eclampsia //  on meds since 2nd child (2000)    Past Surgical History:  Procedure Laterality Date   ABDOMINAL HYSTERECTOMY  2003   partial   BREAST BIOPSY Left 10/01/2017   CARPAL TUNNEL RELEASE     right   GANGLION CYST EXCISION     right   Hammer Toe Repair Bilateral 12/29/2013   Lt #3, #4, Rt #2.    Family History  Problem Relation Age of Onset   Allergies Daughter    Allergic rhinitis Daughter    Allergies Son    Allergic rhinitis Son    Leukemia Paternal Grandmother    Arthritis Mother    Hyperlipidemia Mother    Hypertension Mother    Stroke Mother    Allergic rhinitis Mother    Hypertension Father    Diabetes Father    Arthritis Maternal Grandmother    Stroke Maternal Grandmother 50   Hypertension Maternal Grandmother    Heart attack Maternal Grandmother 52   Coronary artery disease Maternal Aunt 50   Coronary artery disease Maternal Aunt 50   Angioedema Neg Hx    Asthma Neg  Hx    Eczema Neg Hx    Immunodeficiency Neg Hx    Urticaria Neg Hx     Social History   Socioeconomic History   Marital status: Married    Spouse name: Not on file   Number of children: 2   Years of education: Not on file   Highest education level: Not on file  Occupational History    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   Married.  Lives with one child.  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        Social Determinants of Health   Financial Resource Strain: Not on file  Food Insecurity: Not on file  Transportation Needs: Not on file  Physical Activity: Not on file  Stress: Not on file  Social Connections: Not on file  Intimate Partner Violence: Not on file    Outpatient Medications Prior to Visit  Medication Sig Dispense Refill   methocarbamol (ROBAXIN) 500 MG tablet Take 1 tablet (500 mg total) by mouth every 8 (eight) hours as needed for muscle spasms. 30 tablet 0   Multiple Vitamin (MULTIVITAMIN) tablet Take 1 tablet by mouth daily.     potassium chloride SA (KLOR-CON) 20 MEQ tablet Take 2 tablets (40 mEq total) by mouth in the morning, at noon, in the evening, and at bedtime. 240 tablet 5   senna-docusate (SENOKOT-S) 8.6-50 MG tablet Take 1 tablet by mouth daily. 60 tablet 0   [START ON 10/29/2020] spironolactone (ALDACTONE) 50 MG tablet Take 1 tablet (50 mg total) by mouth daily. Do not take until liver function is normal, check with your primary physician 30 tablet 2   No facility-administered medications prior to visit.    Allergies  Allergen Reactions   Amoxicillin     rash   Ace Inhibitors     REACTION: lip swelling    Review of Systems  Constitutional:  Positive for malaise/fatigue (with exertion).  Musculoskeletal:  Positive for joint pain (have improved but still mild).       (+) edema in tissue all over body  Neurological:  Positive for headaches (chronic).      Objective:    Physical Exam Constitutional:      General: She is not in acute distress.    Appearance: Normal appearance. She is not ill-appearing.  HENT:     Head: Normocephalic and atraumatic.     Right Ear: External ear normal.     Left Ear: External ear normal.  Eyes:     Extraocular Movements: Extraocular movements intact.     Pupils: Pupils are equal, round, and reactive to light.   Cardiovascular:     Rate and Rhythm: Normal rate and regular rhythm.     Heart sounds: Normal heart sounds. No murmur heard.   No gallop.  Pulmonary:     Effort: Pulmonary effort is normal. No respiratory distress.     Breath sounds: Normal breath sounds. No wheezing or rales.  Musculoskeletal:        General: Swelling (generalized in bilateral lower extremities)  present.     Right lower leg: Edema present.     Left lower leg: Edema present.  Lymphadenopathy:     Cervical: No cervical adenopathy.  Skin:    General: Skin is warm and dry.  Neurological:     Mental Status: She is alert and oriented to person, place, and time.  Psychiatric:        Behavior: Behavior normal.        Judgment: Judgment normal.    BP 123/73 (BP Location: Right Arm, Patient Position: Sitting, Cuff Size: Small)   Pulse 92   Temp 98 F (36.7 C) (Oral)   Resp 16   Wt 188 lb (85.3 kg)   SpO2 100%   BMI 32.27 kg/m  Wt Readings from Last 3 Encounters:  10/26/20 188 lb (85.3 kg)  10/19/20 173 lb (78.5 kg)  10/17/20 173 lb 12.8 oz (78.8 kg)       Assessment & Plan:   Problem List Items Addressed This Visit       Unprioritized   Transaminitis    Repeat LFT.  Hopefully they will be trending down at this point.  Reminded her to continue to avoid tylenol.       Relevant Orders   Comp Met (CMET)   Magnesium   CBC with Differential/Platelet   Swelling    Worsened by being off of diuretics and hypoalbuminemia.  Recommended lean proteins in her diet. Will re-address diuretics after we have more information from her labs.       Polyarthralgia    Improved. I suspect that this was reactive in the setting of large amounts of supplements, ARF and transaminitis.  Advised her OK to hold off on rheumatology referral.       Hypokalemia    This has been a long standing issue requiring large amounts of Kdur and daily aldactone. She did see nephrology in the past and per their note the etiology is unknown.  They also noted that renin aldosterone and cortisol levels have been acceptable.  I will arrange follow up with nephrology for ongoing management of her hypokalemia. Recheck K+ Mag today.       Relevant Orders   Ambulatory referral to Nephrology   Essential hypertension    BP Readings from Last 3 Encounters:  10/26/20 123/73  10/22/20 107/81  10/17/20 103/81  BP is stable. She is not currently on antihypertensive.       Dysuria    Will check UA/Culture to rule out UTI.       Relevant Orders   Urine Culture   Urinalysis, Routine w reflex microscopic   Acute renal failure (ARF) (HCC)    Last cr was wnl prior to discharge. Diuretics remain on hold for now.  Need to re-evaluate LFT's before considering restarting diuretics.       Other Visit Diagnoses     Renal failure, unspecified chronicity    -  Primary   Relevant Orders   Ambulatory referral to Nephrology      Meds ordered this encounter  Medications   traMADol (ULTRAM) 50 MG tablet    Sig: Take 1 tablet (50 mg total) by mouth every 6 (six) hours as needed for up to 5 days.    Dispense:  20 tablet    Refill:  0    Order Specific Question:   Supervising Provider    Answer:   Penni Homans A [[id number]]   41 minutes spent on today's visit. The majority of this time  was spent reviewing chart and counseling husband/patient.   I, Debbrah Alar, NP, personally preformed the services described in this documentation.  All medical record entries made by the scribe were at my direction and in my presence.  I have reviewed the chart and discharge instructions (if applicable) and agree that the record reflects my personal performance and is accurate and complete. 10/26/2020  I,Lyric Barr-McArthur,acting as a Education administrator for [name], NP.,have documented all relevant documentation on the behalf of [name], NP,as directed by  [name], NP while in the presence of [name], NP.  [name], NP

## 2020-10-26 NOTE — Assessment & Plan Note (Signed)
Will check UA/Culture to rule out UTI.

## 2020-10-26 NOTE — Assessment & Plan Note (Signed)
Worsened by being off of diuretics and hypoalbuminemia.  Recommended lean proteins in her diet. Will re-address diuretics after we have more information from her labs.

## 2020-10-26 NOTE — Assessment & Plan Note (Signed)
Nicole Quinn. Nicole Quinn rx short course of prn tramadol.

## 2020-10-26 NOTE — Assessment & Plan Note (Signed)
BP Readings from Last 3 Encounters:  10/26/20 123/73  10/22/20 107/81  10/17/20 103/81   BP is stable. She is not currently on antihypertensive.

## 2020-10-26 NOTE — Patient Instructions (Signed)
Please remain off of all supplements. Continue to avoid tylenol and ibuprofen. For headache pain you may use as needed tramadol.

## 2020-10-26 NOTE — Assessment & Plan Note (Signed)
Repeat LFT.  Hopefully they will be trending down at this point.  Reminded her to continue to avoid tylenol.

## 2020-10-26 NOTE — Assessment & Plan Note (Signed)
This has been a long standing issue requiring large amounts of Kdur and daily aldactone. She did see nephrology in the past and per their note the etiology is unknown. They also noted that renin aldosterone and cortisol levels have been acceptable.  I will arrange follow up with nephrology for ongoing management of her hypokalemia. Recheck K+ Mag today.

## 2020-10-27 LAB — COMPREHENSIVE METABOLIC PANEL
AG Ratio: 1.4 (calc) (ref 1.0–2.5)
ALT: 396 U/L — ABNORMAL HIGH (ref 6–29)
AST: 194 U/L — ABNORMAL HIGH (ref 10–30)
Albumin: 3.8 g/dL (ref 3.6–5.1)
Alkaline phosphatase (APISO): 302 U/L — ABNORMAL HIGH (ref 31–125)
BUN: 7 mg/dL (ref 7–25)
CO2: 23 mmol/L (ref 20–32)
Calcium: 9.2 mg/dL (ref 8.6–10.2)
Chloride: 108 mmol/L (ref 98–110)
Creat: 0.73 mg/dL (ref 0.50–0.99)
Globulin: 2.8 g/dL (calc) (ref 1.9–3.7)
Glucose, Bld: 85 mg/dL (ref 65–99)
Potassium: 4.6 mmol/L (ref 3.5–5.3)
Sodium: 140 mmol/L (ref 135–146)
Total Bilirubin: 0.4 mg/dL (ref 0.2–1.2)
Total Protein: 6.6 g/dL (ref 6.1–8.1)

## 2020-10-27 LAB — MAGNESIUM: Magnesium: 2.2 mg/dL (ref 1.5–2.5)

## 2020-10-27 LAB — CBC WITH DIFFERENTIAL/PLATELET
Absolute Monocytes: 720 cells/uL (ref 200–950)
Basophils Absolute: 38 cells/uL (ref 0–200)
Basophils Relative: 0.4 %
Eosinophils Absolute: 67 cells/uL (ref 15–500)
Eosinophils Relative: 0.7 %
HCT: 29.7 % — ABNORMAL LOW (ref 35.0–45.0)
Hemoglobin: 9.6 g/dL — ABNORMAL LOW (ref 11.7–15.5)
Lymphs Abs: 2851 cells/uL (ref 850–3900)
MCH: 29.6 pg (ref 27.0–33.0)
MCHC: 32.3 g/dL (ref 32.0–36.0)
MCV: 91.7 fL (ref 80.0–100.0)
MPV: 9.8 fL (ref 7.5–12.5)
Monocytes Relative: 7.5 %
Neutro Abs: 5923 cells/uL (ref 1500–7800)
Neutrophils Relative %: 61.7 %
Platelets: 479 10*3/uL — ABNORMAL HIGH (ref 140–400)
RBC: 3.24 10*6/uL — ABNORMAL LOW (ref 3.80–5.10)
RDW: 12.9 % (ref 11.0–15.0)
Total Lymphocyte: 29.7 %
WBC: 9.6 10*3/uL (ref 3.8–10.8)

## 2020-10-29 ENCOUNTER — Ambulatory Visit: Payer: BC Managed Care – PPO | Admitting: Family

## 2020-10-29 ENCOUNTER — Telehealth: Payer: Self-pay | Admitting: Family

## 2020-10-29 ENCOUNTER — Encounter: Payer: Self-pay | Admitting: Family

## 2020-10-29 DIAGNOSIS — D649 Anemia, unspecified: Secondary | ICD-10-CM

## 2020-10-29 LAB — URINALYSIS, ROUTINE W REFLEX MICROSCOPIC
Bacteria, UA: NONE SEEN /HPF
Bilirubin Urine: NEGATIVE
Glucose, UA: NEGATIVE
Ketones, ur: NEGATIVE
Nitrite: NEGATIVE
Specific Gravity, Urine: 1.019 (ref 1.001–1.035)
pH: 5.5 (ref 5.0–8.0)

## 2020-10-29 LAB — MICROSCOPIC MESSAGE

## 2020-10-29 LAB — URINE CULTURE
MICRO NUMBER:: 12475616
SPECIMEN QUALITY:: ADEQUATE

## 2020-10-29 MED ORDER — NITROFURANTOIN MONOHYD MACRO 100 MG PO CAPS: 100.0000 mg | ORAL_CAPSULE | Freq: Two times a day (BID) | ORAL | 0 refills | Status: DC

## 2020-10-29 MED ORDER — FUROSEMIDE 20 MG PO TABS: ORAL_TABLET | ORAL | 0 refills | Status: DC

## 2020-10-29 NOTE — Telephone Encounter (Signed)
Patient advised of results, new prescription and provider's comments.  She was advised Nitrofurantoin was sent instead of keflex due to allergy. She will have someone pick up IFOB today and was scheduled to come in Thursday.

## 2020-10-29 NOTE — Telephone Encounter (Signed)
Lab work shows UTI.  I would like for her to start keflex for UTI.   Her LFT's are still high but they are coming down.   She is quite anemic.  I would like her to complete IFOB please as well as some additional iron/b12/folate studies.    In the meantime, she can take furosemide 20mg  once daily for 3 days only for swelling. Repeat CMET on Thursday dx transaminitis  I have sent a note to the GI team asking their opinion on when we can safely restart her aldactone. Marland Kitchen

## 2020-10-29 NOTE — Telephone Encounter (Signed)
-----   Message from Milus Banister, MD sent at 10/29/2020  8:11 AM EDT ----- I think it is fine to restart her aldactone, that was pretty unlikely to be the culprit here.   ----- Message ----- From: Debbrah Alar, NP Sent: 10/29/2020   7:55 AM EDT To: Alfredia Ferguson, PA-C, Milus Banister, MD  Good Morning,   Just a heads up on this pt.  Her LFT's are still elevated but trending down. She is miserable with swelling as her aldactone remains on hold.  She will be seeing Amy on 10/18. I will defer repeat LFT to you on that day.  After you review her next set of LFT's, would you please let me know if it is OK to restart her Aldactone?  I am afraid to give her lasix as she has such severe hypokalemia.  If you feel it is safe from a GI stand point to restart aldactone sooner, please let me know.   Thanks,  [employer]

## 2020-10-29 NOTE — Telephone Encounter (Signed)
Contacted patient back and advised her that GI has OK'd her to restart her aldactone.  I advised her that she does not need to start furosemide as instructed earlier.  I left a message at [facility] requesting that they cancel the rx for furosemide. I did instruct her to go ahead and start the aldactone and the macrobid and follow up for labs on Thursday.  Pt verbalizes understanding.

## 2020-10-30 ENCOUNTER — Telehealth: Payer: Self-pay | Admitting: Family

## 2020-10-30 DIAGNOSIS — E876 Hypokalemia: Secondary | ICD-10-CM

## 2020-10-30 NOTE — Telephone Encounter (Signed)
See mychart.  

## 2020-11-01 ENCOUNTER — Other Ambulatory Visit: Payer: Self-pay

## 2020-11-01 ENCOUNTER — Other Ambulatory Visit (INDEPENDENT_AMBULATORY_CARE_PROVIDER_SITE_OTHER): Payer: BC Managed Care – PPO

## 2020-11-01 DIAGNOSIS — D649 Anemia, unspecified: Secondary | ICD-10-CM

## 2020-11-01 LAB — FOLATE: Folate: 12.8 ng/mL (ref >5.9)

## 2020-11-01 LAB — FERRITIN: Ferritin: 96.7 ng/mL (ref 10.0–291.0)

## 2020-11-01 LAB — IRON: Iron: 87 ug/dL (ref 42–145)

## 2020-11-01 LAB — VITAMIN B12: Vitamin B-12: >1550 pg/mL — ABNORMAL HIGH (ref 211–911)

## 2020-11-01 LAB — FECAL OCCULT BLOOD, IMMUNOCHEMICAL: Fecal Occult Bld: NEGATIVE

## 2020-11-02 ENCOUNTER — Encounter: Payer: Self-pay | Admitting: Family

## 2020-11-05 ENCOUNTER — Encounter: Payer: Self-pay | Admitting: Family

## 2020-11-05 DIAGNOSIS — N644 Mastodynia: Secondary | ICD-10-CM

## 2020-11-06 ENCOUNTER — Ambulatory Visit: Payer: BC Managed Care – PPO | Admitting: Physician Assistant

## 2020-11-06 ENCOUNTER — Other Ambulatory Visit (INDEPENDENT_AMBULATORY_CARE_PROVIDER_SITE_OTHER): Payer: BC Managed Care – PPO

## 2020-11-06 ENCOUNTER — Encounter: Payer: Self-pay | Admitting: Physician Assistant

## 2020-11-06 VITALS — BP 108/78 | HR 88 | Ht 64.0 in | Wt 180.0 lb

## 2020-11-06 DIAGNOSIS — R5383 Other fatigue: Secondary | ICD-10-CM | POA: Diagnosis not present

## 2020-11-06 DIAGNOSIS — R7401 Elevation of levels of liver transaminase levels: Secondary | ICD-10-CM | POA: Diagnosis not present

## 2020-11-06 DIAGNOSIS — D649 Anemia, unspecified: Secondary | ICD-10-CM

## 2020-11-06 DIAGNOSIS — B179 Acute viral hepatitis, unspecified: Secondary | ICD-10-CM | POA: Diagnosis not present

## 2020-11-06 LAB — HEPATIC FUNCTION PANEL
ALT: 34 U/L (ref 0–35)
AST: 18 U/L (ref 0–37)
Albumin: 4.6 g/dL (ref 3.5–5.2)
Alkaline Phosphatase: 173 U/L — ABNORMAL HIGH (ref 39–117)
Bilirubin, Direct: 0 mg/dL (ref 0.0–0.3)
Total Bilirubin: 0.4 mg/dL (ref 0.2–1.2)
Total Protein: 8.5 g/dL — ABNORMAL HIGH (ref 6.0–8.3)

## 2020-11-06 LAB — CBC WITH DIFFERENTIAL/PLATELET
Basophils Absolute: 0.1 10*3/uL (ref 0.0–0.1)
Basophils Relative: 1 % (ref 0.0–3.0)
Eosinophils Absolute: 0.1 10*3/uL (ref 0.0–0.7)
Eosinophils Relative: 0.8 % (ref 0.0–5.0)
HCT: 34.9 % — ABNORMAL LOW (ref 36.0–46.0)
Hemoglobin: 11.9 g/dL — ABNORMAL LOW (ref 12.0–15.0)
Lymphocytes Relative: 40.7 % (ref 12.0–46.0)
Lymphs Abs: 2.7 10*3/uL (ref 0.7–4.0)
MCHC: 34.2 g/dL (ref 30.0–36.0)
MCV: 91.8 fl (ref 78.0–100.0)
Monocytes Absolute: 0.6 10*3/uL (ref 0.1–1.0)
Monocytes Relative: 8.5 % (ref 3.0–12.0)
Neutro Abs: 3.3 10*3/uL (ref 1.4–7.7)
Neutrophils Relative %: 49 % (ref 43.0–77.0)
Platelets: 459 10*3/uL — ABNORMAL HIGH (ref 150.0–400.0)
RBC: 3.8 Mil/uL — ABNORMAL LOW (ref 3.87–5.11)
RDW: 13.9 % (ref 11.5–15.5)
WBC: 6.7 10*3/uL (ref 4.0–10.5)

## 2020-11-06 LAB — PROTIME-INR
INR: 1.2 ratio — ABNORMAL HIGH (ref 0.8–1.0)
Prothrombin Time: 13 s (ref 9.6–13.1)

## 2020-11-06 NOTE — Progress Notes (Signed)
Subjective:    Patient ID: Nicole Quinn, female    DOB: 1975-04-27, 45 y.o.   MRN: [id number]  HPI Nicole Quinn is a pleasant 45 year old African-American female, recently established with GI/Dr. Ardis Hughs when she was seen in consultation with recent hospitalization 9/30 through 10/22/2020.  She been feeling poorly over the previous 3 to 4 weeks with complaints of fatigue, aching muscles achy joints, nausea and mild abdominal discomfort.  She had been undergoing evaluation through her PCP and was noted to have marked elevation of her transaminases and was referred to the emergency room. She underwent CT of the abdomen and pelvis which was unremarkable and upper abdominal ultrasound which also did not show any evidence of gallstones or biliary ductal dilation, and normal-appearing liver on both studies. On 10/20/2020 potassium was 2.6.  Patient had been having problems with persistent hypokalemia over the past 5 years requiring replacement, BUN 14/creatinine 0.92, AST 269/ALT 286/alk phos 319 and T bili of 0.3. Similar pattern on 10/19/2020 Initial impression was that of a drug-induced liver injury as patient had been on a detox program with an herbal product called D herbs detox which was a 30-day regimen that both she and her husband took for about 2 weeks and had started this on 10/01/2020. Reviewing her labs her liver tests were normal previously  LFTs were followed for couple of days and showed some mild improvement.  She did not have any evidence of hepatic failure or encephalopathy.  Normal pro time/INR. She did have other testing done to include EBV serologies, CMV serologies acute hepatitis panel, all of which have returned negative. ANA was negative, mitochondrial antibody negative, ceruloplasmin negative, alpha 1 antitrypsin slightly elevated at 219 F-actin IgG weakly positive at 20.  Since discharge from the hospital she has also noted to be a bit more anemic with hemoglobin 9.6 hematocrit 29.7  MCV of 90 on 10/26/2020.  Iron studies B12 and folate unremarkable, stool Hemoccult negative.  LFTs 10/26/2020-T bili 0.4/alk phos 302/AST 194/ALT 396-stable  Patient says she definitely feels better since she was hospitalized and most of her symptoms have resolved other than fatigue which has persisted She has been able to eat without any difficulty occasionally has some mild nausea but that had much improved, no further joint aches or muscle aches, no fevers or chills, no abdominal pain.  She had been on Aldactone prior to admission and this was discontinued with the acute hepatitis as Aldactone can be associated with elevated transaminases.  However after discharge she developed some increase in peripheral edema and this has been resumed by PCP.  Patient relates that she has prior history of anemia but that was felt secondary to menses in the past, she is status post hysterectomy.    Review of Systems Pertinent positive and negative review of systems were noted in the above HPI section.  All other review of systems was otherwise negative.   Outpatient Encounter Medications as of 11/06/2020  Medication Sig   methocarbamol (ROBAXIN) 500 MG tablet Take 1 tablet (500 mg total) by mouth every 8 (eight) hours as needed for muscle spasms.   Multiple Vitamin (MULTIVITAMIN) tablet Take 1 tablet by mouth daily.   potassium chloride SA (KLOR-CON) 20 MEQ tablet Take 2 tablets (40 mEq total) by mouth in the morning, at noon, in the evening, and at bedtime.   senna-docusate (SENOKOT-S) 8.6-50 MG tablet Take 1 tablet by mouth daily.   spironolactone (ALDACTONE) 50 MG tablet Take 1 tablet (50 mg total) by mouth  daily. Do not take until liver function is normal, check with your primary physician   [DISCONTINUED] nitrofurantoin, macrocrystal-monohydrate, (MACROBID) 100 MG capsule Take 1 capsule (100 mg total) by mouth 2 (two) times daily.   No facility-administered encounter medications on file as of 11/06/2020.    Allergies  Allergen Reactions   Amoxicillin     rash   Ace Inhibitors     REACTION: lip swelling   Patient Active Problem List   Diagnosis Date Noted   Acute renal failure (ARF) ([place]) 10/26/2020   Nonintractable headache 10/26/2020   Transaminitis 10/20/2020   Chronic hypokalemia 10/20/2020   Hypokalemia 10/20/2020   Abdominal pain 10/17/2020   Polyarthralgia 10/17/2020   Hot flashes 10/17/2020   Body aches 09/21/2020   Dysuria 02/13/2018   Arthralgia 02/13/2018   Angioedema 06/26/2016   Seasonal and perennial allergic rhinitis 06/26/2016   Allergic conjunctivitis 06/26/2016   Food allergy 06/26/2016   Allergic urticaria 06/26/2016   Depression 01/29/2016   Left wrist pain 11/29/2015   Obesity (BMI 30.0-34.9) 08/12/2013   Swelling 05/20/2013   Other and unspecified hyperlipidemia 02/24/2013   Chronic constipation 02/22/2013   Routine general medical examination at a health care facility 02/22/2013   Anxiety associated with depression 10/10/2011   Dyspnea 08/24/2011   OTHER ACNE 06/04/2009   HYPERGLYCEMIA, BORDERLINE 06/04/2009   Essential hypertension 04/10/2009   Social History   Socioeconomic History   Marital status: Married    Spouse name: Not on file   Number of children: 2   Years of education: Not on file   Highest education level: Not on file  Occupational History    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   Married.  Lives with one child.  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        Social Determinants of Health   Financial Resource Strain: Not on file  Food  Insecurity: Not on file  Transportation Needs: Not on file  Physical Activity: Not on file  Stress: Not on file  Social Connections: Not on file  Intimate Partner Violence: Not on file    Nicole Quinn family history includes Allergic rhinitis in her daughter, mother, and son; Allergies in her daughter and son; Arthritis in her maternal grandmother and mother; Coronary artery disease (age of onset: 47) in her maternal aunt and maternal aunt; Diabetes in her father; Heart attack (age of onset: 62) in her maternal grandmother; Hyperlipidemia in her mother; Hypertension in her father, maternal grandmother, and mother; Leukemia in her paternal grandmother; Stroke in her mother; Stroke (age of onset: 61) in her maternal grandmother.      Objective:    Vitals:   11/06/20 1328  BP: 108/78  Pulse: 88  SpO2: 98%    Physical Exam Well-developed well-nourished African-American female in no acute distress.  Pleasant, height, Weight, 180 BMI 30.9  HEENT; nontraumatic normocephalic, EOMI, PE R LA, sclera anicteric. Oropharynx; not examined today Neck; supple, no JVD Cardiovascular; regular rate and rhythm with S1-S2, no murmur rub or gallop Pulmonary; Clear bilaterally Abdomen;  soft, nontender, nondistended, no palpable mass or hepatosplenomegaly, bowel sounds are active Rectal; not done today Skin; benign exam, no jaundice rash or appreciable lesions Extremities; no clubbing cyanosis or edema skin warm and dry Neuro/Psych; alert and oriented x4, grossly nonfocal mood and affect appropriate        Assessment & Plan:    #22  45 year old African-American female with recent hospitalization 9/30 through 10/22/2020 with acute illness of 3 to 4-week duration with significant fatigue, myalgias, arthralgias, nausea and associated significant transaminitis. Acute viral serologies, CMV and EBV serologies all negative, ANA negative. Picture felt most consistent with DI LI (drug-induced liver injury) most  likely secondary to an herbal detox product the patient had taken for several weeks.  She had no evidence of hepatic decompensation, and LFTs showed some improvement prior to discharge.  She does have a very weakly positive F-actin at 20. ,  Raising possibility of an underlying autoimmune hepatitis type picture  Overall patient has had continued improvement in symptoms over the past 2-1/2 weeks with primary residual symptom of fatigue. Most recent LFTs 10/26/2020 showed continued improvement  #2 normocytic anemia-normal iron studies B12 and folate  #3 several year history of persistent hypokalemia, and intermittent peripheral edema of uncertain etiology  #4 hypertension  Plan; CBC with differential today, repeat hepatic panel, check IgG and smooth muscle antibody. Discussed with patient that we may need to continue to follow her LFTs over several weeks to months to resolution, Further recommendation pending today's labs, will determine need for interval office follow-up pending today's labs. Patient knows to call should she have any exacerbation/worsening of fatigue, nausea arthralgias or other systemic symptoms.      Echo Propp Nicole Harold PA-C 11/06/2020   Cc: Debbrah Alar, NP

## 2020-11-06 NOTE — Progress Notes (Signed)
0

## 2020-11-06 NOTE — Patient Instructions (Signed)
If you are age 43 or younger, your body mass index should be between 19-25. Your Body mass index is 30.9 kg/m. If this is out of the aformentioned range listed, please consider follow up with your Primary Care Provider.  __________________________________________________________  The [facility] providers would like to encourage you to use [facility] to communicate with providers for non-urgent requests or questions.  Due to long hold times on the telephone, sending your provider a message by [facility] may be a faster and more efficient way to get a response.  Please allow 48 business hours for a response.  Please remember that this is for non-urgent requests.   Your provider has requested that you go to the basement level for lab work before leaving today. Press "B" on the elevator. The lab is located at the first door on the left as you exit the elevator.  Call or send a message to the office if any of your symptoms worsen.  Follow up pending the results of your labs.  Thank you for entrusting me with your care and choosing [facility].  Amy Esterwood, PA-C

## 2020-11-07 ENCOUNTER — Other Ambulatory Visit: Payer: Self-pay

## 2020-11-07 DIAGNOSIS — R7401 Elevation of levels of liver transaminase levels: Secondary | ICD-10-CM

## 2020-11-07 NOTE — Progress Notes (Signed)
I agree with the above note, plan. HEr LFTs have nearly completley normalized.  I think repeat LFTs and INR in 6 weeks is reasonable. She should never take any supplements, herbs, OTC chemicals of any kind unless cleared by her MD.  Her liver has proven to be pretty sensitive to these types of things.  thanks

## 2020-11-08 LAB — ANTI-SMOOTH MUSCLE ANTIBODY, IGG: Actin (Smooth Muscle) Antibody (IGG): 25 U — ABNORMAL HIGH (ref <20)

## 2020-11-08 LAB — IGG: IgG (Immunoglobin G), Serum: 1364 mg/dL (ref 600–1640)

## 2020-11-09 ENCOUNTER — Other Ambulatory Visit: Payer: Self-pay | Admitting: Family

## 2020-11-09 DIAGNOSIS — N644 Mastodynia: Secondary | ICD-10-CM

## 2020-11-13 ENCOUNTER — Telehealth: Payer: Self-pay | Admitting: Family

## 2020-11-20 ENCOUNTER — Other Ambulatory Visit: Payer: Self-pay | Admitting: Family

## 2020-11-20 NOTE — Telephone Encounter (Signed)
Lvm for patient to call back about this request

## 2020-11-20 NOTE — Telephone Encounter (Signed)
Saw request for macribid refill. Does she have recurrent UTI symptoms?If so, she needs OV first please.

## 2020-11-21 NOTE — Telephone Encounter (Signed)
error 

## 2020-11-27 ENCOUNTER — Ambulatory Visit
Admission: RE | Admit: 2020-11-27 | Discharge: 2020-11-27 | Disposition: A | Discharge status: Home / Self Care | Payer: BC Managed Care – PPO | Source: Ambulatory Visit | Attending: Family | Admitting: Family

## 2020-11-27 ENCOUNTER — Other Ambulatory Visit: Payer: Self-pay | Admitting: Family

## 2020-11-27 ENCOUNTER — Other Ambulatory Visit: Payer: Self-pay

## 2020-11-27 DIAGNOSIS — N644 Mastodynia: Secondary | ICD-10-CM

## 2020-11-27 DIAGNOSIS — R922 Inconclusive mammogram: Secondary | ICD-10-CM | POA: Diagnosis not present

## 2020-12-04 ENCOUNTER — Other Ambulatory Visit: Payer: Self-pay

## 2020-12-04 ENCOUNTER — Ambulatory Visit: Payer: BC Managed Care – PPO | Admitting: Internal Medicine

## 2020-12-04 ENCOUNTER — Encounter: Payer: Self-pay | Admitting: Internal Medicine

## 2020-12-04 VITALS — BP 120/72 | HR 64 | Ht 64.0 in | Wt 179.0 lb

## 2020-12-04 DIAGNOSIS — I10 Essential (primary) hypertension: Secondary | ICD-10-CM

## 2020-12-04 DIAGNOSIS — R7401 Elevation of levels of liver transaminase levels: Secondary | ICD-10-CM | POA: Diagnosis not present

## 2020-12-04 DIAGNOSIS — E876 Hypokalemia: Secondary | ICD-10-CM | POA: Diagnosis not present

## 2020-12-04 LAB — COMPREHENSIVE METABOLIC PANEL
ALT: 14 U/L (ref 0–35)
AST: 14 U/L (ref 0–37)
Albumin: 4.6 g/dL (ref 3.5–5.2)
Alkaline Phosphatase: 86 U/L (ref 39–117)
BUN: 18 mg/dL (ref 6–23)
CO2: 32 mEq/L (ref 19–32)
Calcium: 9.7 mg/dL (ref 8.4–10.5)
Chloride: 98 mEq/L (ref 96–112)
Creatinine, Ser: 1.03 mg/dL (ref 0.40–1.20)
GFR: 66.48 mL/min (ref >60.00)
Glucose, Bld: 76 mg/dL (ref 70–99)
Potassium: 3.3 mEq/L — ABNORMAL LOW (ref 3.5–5.1)
Sodium: 138 mEq/L (ref 135–145)
Total Bilirubin: 0.4 mg/dL (ref 0.2–1.2)
Total Protein: 7.6 g/dL (ref 6.0–8.3)

## 2020-12-04 LAB — T4, FREE: Free T4: 0.83 ng/dL (ref 0.60–1.60)

## 2020-12-04 LAB — PHOSPHORUS: Phosphorus: 3.3 mg/dL (ref 2.3–4.6)

## 2020-12-04 LAB — TSH: TSH: 1.39 u[IU]/mL (ref 0.35–5.50)

## 2020-12-04 LAB — MAGNESIUM: Magnesium: 1.9 mg/dL (ref 1.5–2.5)

## 2020-12-04 NOTE — Patient Instructions (Signed)
SALIVARY CORTISOL COLLECTION INSTRUCTIONS    Precautions:  Please collect sample at midnight. You will need to do this on 2 nights  No food or fluids 30 minutes prior to collection.  Do not use any creams, lotions on hands, or use steroid inhalers 24- hours prior to collection.  Wash hands carefully.  Avoid any activity that could cause your gums to bleed: including flushing of brushing your teeth.  Kit must not be used in children less than 43 years of age, or a person that is at risk for choking on collection kit.  Instructions for saliva collection:   Rinse mouth thoroughly with water and discard. Do not swallow.  Hold the Salivette at the rim of the suspended insert and remove the stopper.  Remove the swab.  Place swab under tongue until well saturated, approximately 1 minute.   Return the saturated swab to the suspended insert and close the Salivette firmly with the stopper.  Do not remove the tube holding the insert. The Salivette should be sent to the lab with the swab.   Come to the lab and leave the Salivette kit for labeling with your identifying information.   Make sure you refrigerate sample if not bringing to the lab immediately. Try to use cold packs for transportation if available.

## 2020-12-04 NOTE — Progress Notes (Signed)
Name: Nicole Quinn  MRN/ DOB: [id number], 02-Dec-1975    Age/ Sex: 45 y.o., female    PCP: Debbrah Alar, NP   Reason for Endocrinology Evaluation: Hypokalemia      Date of Initial Endocrinology Evaluation: 12/04/2020     HPI: [name] is a 45 y.o. female with a past medical history of HTN. The patient presented for initial endocrinology clinic visit on 12/04/2020 for consultative assistance with her Hypokalemia .     She was diagnosed with HTN in 2000 after her second child was born. She had pre-eclampsia with her 2 pregnancies.   Pt has been noted with intermittent hypokalemia since 2013 with a nadir of 2.5 mmol/L in 2017. Over the years she has been on HCTZ and furosemide, and torsemide   Per pt if she is off diuretic therapy , she has generalized edema.  She has been evaluated by cardiology, vascular and nephrology    She was seen by [facility] in 03/2020 , per note Aldo, renin, Cortisol was normal. She also had a laxative panel     She was recently evaluated by GI for tansaminitis , viral serology was negative , this was felt to be related to drug-related liver injury due to herbal detox.     Denies licorice intake  No herbal tea or supplements  She has mastalgia of the left breast for the past 2 months, mammo has been normal  Has daily dizziness, has hx of syncope , no triggering factors, at times associated with nausea   She is on MVI  She is on Miralax for constipation which is chronic  Has occasional palpitations  Occasional hand tremors    Spironolactone 50 mg daily  Kcl 20 Meq 2 tabs  four times a day   Mother and grandmother with hx of CVA     HISTORY:  Past Medical History:  Past Medical History:  Diagnosis Date   Allergy    Anemia    Endometriosis    History of echocardiogram    Echo 6/18: EF 60-65, no RWMA, normal diastolic function   History of nuclear stress test    Myoview 6/18: EF 60, normal perfusion, Low Risk    History of UTI    Hypertension    pregnancy induced HTN/pre-eclampsia //  on meds since 2nd child (2000)   Past Surgical History:  Past Surgical History:  Procedure Laterality Date   ABDOMINAL HYSTERECTOMY  2003   partial   BREAST BIOPSY Left 10/01/2017   CARPAL TUNNEL RELEASE     right   GANGLION CYST EXCISION     right   Hammer Toe Repair Bilateral 12/29/2013   Lt #3, #4, Rt #2.    Social History:  reports that she has never smoked. She has never used smokeless tobacco. She reports that she does not drink alcohol and does not use drugs. Family History: family history includes Allergic rhinitis in her daughter, mother, and son; Allergies in her daughter and son; Arthritis in her maternal grandmother and mother; Coronary artery disease (age of onset: 67) in her maternal aunt and maternal aunt; Diabetes in her father; Heart attack (age of onset: 50) in her maternal grandmother; Hyperlipidemia in her mother; Hypertension in her father, maternal grandmother, and mother; Leukemia in her paternal grandmother; Stroke in her mother; Stroke (age of onset: 14) in her maternal grandmother.   HOME MEDICATIONS: Allergies as of 12/04/2020       Reactions   Amoxicillin  rash   Ace Inhibitors    REACTION: lip swelling        Medication List        Accurate as of December 04, 2020  1:00 PM. If you have any questions, ask your nurse or doctor.          methocarbamol 500 MG tablet Commonly known as: Robaxin Take 1 tablet (500 mg total) by mouth every 8 (eight) hours as needed for muscle spasms.   multivitamin tablet Take 1 tablet by mouth daily.   potassium chloride SA 20 MEQ tablet Commonly known as: KLOR-CON Take 2 tablets (40 mEq total) by mouth in the morning, at noon, in the evening, and at bedtime.   spironolactone 50 MG tablet Commonly known as: ALDACTONE Take 1 tablet (50 mg total) by mouth daily. Do not take until liver function is normal, check with your primary  physician          REVIEW OF SYSTEMS: A comprehensive ROS was conducted with the patient and is negative except as per HPI    OBJECTIVE:  VS: BP 120/72 (BP Location: Left Arm, Patient Position: Sitting, Cuff Size: Small)   Pulse 64   Ht 5\' 4"  (1.626 m)   Wt 179 lb (81.2 kg)   SpO2 99%   BMI 30.73 kg/m    Wt Readings from Last 3 Encounters:  12/04/20 179 lb (81.2 kg)  11/06/20 180 lb (81.6 kg)  10/26/20 188 lb (85.3 kg)     EXAM: General: Pt appears well and is in NAD  Neck: General: Supple without adenopathy. Thyroid: Thyroid size normal.  No goiter or nodules appreciated. No thyroid bruit.  Lungs: Clear with good BS bilat with no rales, rhonchi, or wheezes  Heart: Auscultation: RRR.  Abdomen: Normoactive bowel sounds, soft, nontender, without masses or organomegaly palpable  Extremities:  BL LE: No pretibial edema normal ROM and strength.  Skin: Hair: Texture and amount normal with gender appropriate distribution Skin Inspection: No rashes Skin Palpation: Skin temperature, texture, and thickness normal to palpation  Mental Status: Judgment, insight: Intact Memory: Intact for recent and remote events Mood and affect: No depression, anxiety, or agitation     DATA REVIEWED:  Results for AZLYNN, MITNICK (MRN [id number]) as of 12/05/2020 12:17  Ref. Range 12/04/2020 08:35  Sodium Latest Ref Range: 135 - 145 mEq/L 138  Potassium Latest Ref Range: 3.5 - 5.1 mEq/L 3.3 (L)  Chloride Latest Ref Range: 96 - 112 mEq/L 98  CO2 Latest Ref Range: 19 - 32 mEq/L 32  Glucose Latest Ref Range: 70 - 99 mg/dL 76  BUN Latest Ref Range: 6 - 23 mg/dL 18  Creatinine Latest Ref Range: 0.40 - 1.20 mg/dL 1.03  Calcium Latest Ref Range: 8.4 - 10.5 mg/dL 9.7  Phosphorus Latest Ref Range: 2.3 - 4.6 mg/dL 3.3  Magnesium Latest Ref Range: 1.5 - 2.5 mg/dL 1.9  Alkaline Phosphatase Latest Ref Range: 39 - 117 U/L 86  Albumin Latest Ref Range: 3.5 - 5.2 g/dL 4.6  AST Latest Ref Range: 0 - 37  U/L 14  ALT Latest Ref Range: 0 - 35 U/L 14  Total Protein Latest Ref Range: 6.0 - 8.3 g/dL 7.6  Total Bilirubin Latest Ref Range: 0.2 - 1.2 mg/dL 0.4  GFR Latest Ref Range: >60.00 mL/min 66.48  TSH Latest Ref Range: 0.35 - 5.50 uIU/mL 1.39  T4,Free(Direct) Latest Ref Range: 0.60 - 1.60 ng/dL 0.83       CT Abdomen 10/19/2020 FINDINGS: Lower chest:  There is atelectasis in the lung bases.   Hepatobiliary: No focal liver abnormality is seen. No gallstones, gallbladder wall thickening, or biliary dilatation.   Pancreas: Unremarkable. No pancreatic ductal dilatation or surrounding inflammatory changes.   Spleen: Normal in size without focal abnormality.   Adrenals/Urinary Tract: Bilateral adrenal glands are within normal limits.   Stomach/Bowel: Small gastric fundal diverticulum is unchanged. Stomach is otherwise within normal limits. Appendix appears normal. No evidence of bowel wall thickening, distention, or inflammatory changes.   Vascular/Lymphatic: No significant vascular findings are present. No enlarged abdominal or pelvic lymph nodes.   Reproductive: Status post hysterectomy. No adnexal masses.   Other: No abdominal wall hernia or abnormality. No abdominopelvic ascites. There is a small fat containing umbilical hernia. There is no ascites.   Musculoskeletal: No acute or significant osseous findings.   IMPRESSION: 1. No acute localizing process in the abdomen or pelvis.  ASSESSMENT/PLAN/RECOMMENDATIONS:   Spontaneous Hypokalemia :  - We discussed endocrine D/D of hypokalemia to include hyperaldosteronism, cushing syndrome or apparently mineralocorticoid activity  - She understands that as long as she is on Spironolactone , I am unable to successfully test her for aldo and renin, we have opted to keep this for now.  - CT imaging of abdomen show  normal adrenal glands  - Will proceed with screening for cushing syndrome through salivary cortisol  - Will have to  determine if she will be able  to be off diuretics for 6 weeks   - In the meantime her K is still low despite being on mega doses of Kcl, I will not change her potassium but will increase Spironolactone  Medication  Increase Spironolactone 50 mg to twice daily  Continue Kcl 20 mg 2 tabs QID     2. Mastalgia :   - Most likely secondary to Spironolactone, will switch to Epleronone in the future     F/U in 3 months   Signed electronically by: Mack Guise, MD  [facility] Endocrinology  [facility] [address] [place], [place] [address] Phone: [phone] FAX: [phone]   CC: Debbrah Alar, NP [address] [place] [address] Phone: [phone] Fax: [phone]   Return to Endocrinology clinic as below: No future appointments.

## 2020-12-05 MED ORDER — SPIRONOLACTONE 50 MG PO TABS: 50.0000 mg | ORAL_TABLET | Freq: Two times a day (BID) | ORAL | 6 refills | Status: DC

## 2020-12-17 ENCOUNTER — Other Ambulatory Visit: Payer: Self-pay

## 2020-12-17 ENCOUNTER — Other Ambulatory Visit: Payer: BC Managed Care – PPO

## 2020-12-17 ENCOUNTER — Other Ambulatory Visit (INDEPENDENT_AMBULATORY_CARE_PROVIDER_SITE_OTHER): Payer: BC Managed Care – PPO

## 2020-12-17 DIAGNOSIS — R7401 Elevation of levels of liver transaminase levels: Secondary | ICD-10-CM | POA: Diagnosis not present

## 2020-12-17 DIAGNOSIS — E876 Hypokalemia: Secondary | ICD-10-CM | POA: Diagnosis not present

## 2020-12-17 LAB — HEPATIC FUNCTION PANEL
ALT: 17 U/L (ref 0–35)
AST: 15 U/L (ref 0–37)
Albumin: 4.6 g/dL (ref 3.5–5.2)
Alkaline Phosphatase: 84 U/L (ref 39–117)
Bilirubin, Direct: 0.1 mg/dL (ref 0.0–0.3)
Total Bilirubin: 0.2 mg/dL (ref 0.2–1.2)
Total Protein: 8.3 g/dL (ref 6.0–8.3)

## 2020-12-19 ENCOUNTER — Other Ambulatory Visit: Payer: Self-pay

## 2020-12-19 DIAGNOSIS — R7401 Elevation of levels of liver transaminase levels: Secondary | ICD-10-CM

## 2020-12-19 DIAGNOSIS — D649 Anemia, unspecified: Secondary | ICD-10-CM

## 2020-12-24 LAB — SALIVARY CORTISOL X2, TIMED
Salivary Cortisol 2nd Specimen: 0.035 ug/dL
Salivary Cortisol Baseline: 0.081 ug/dL

## 2020-12-25 ENCOUNTER — Encounter: Payer: Self-pay | Admitting: Internal Medicine

## 2021-01-30 ENCOUNTER — Telehealth: Payer: Self-pay | Admitting: Family

## 2021-01-30 NOTE — Telephone Encounter (Signed)
Lvm patient advised to bring form in or fax to office and we will let her know if she needs to come in.

## 2021-01-30 NOTE — Telephone Encounter (Signed)
Patient requesting a form to be filled out for school. Since we are close to the last date of her CPE from last year. Will MO still complete form? Pt will need it done before she is due.

## 2021-02-01 DIAGNOSIS — Z0184 Encounter for antibody response examination: Secondary | ICD-10-CM

## 2021-02-01 DIAGNOSIS — Z111 Encounter for screening for respiratory tuberculosis: Secondary | ICD-10-CM

## 2021-02-01 NOTE — Telephone Encounter (Signed)
Forms not received yet, MyChart message sent

## 2021-02-04 NOTE — Progress Notes (Signed)
Name: Nicole Quinn  MRN/ DOB: [id number], 22-Aug-1975    Age/ Sex: 46 y.o., female    PCP: Debbrah Alar, NP   Reason for Endocrinology Evaluation: Hypokalemia      Date of Initial Endocrinology Evaluation: 12/04/2020    HPI: [name] is a 46 y.o. female with a past medical history of HTN. The patient presented for initial endocrinology clinic visit on 12/04/2020 for consultative assistance with her Hypokalemia .     She was diagnosed with HTN in 2000 after her second child was born. She had pre-eclampsia with her 2 pregnancies.   Pt has been noted with intermittent hypokalemia since 2013 with a nadir of 2.5 mmol/L in 2017. Over the years she has been on HCTZ and furosemide, and torsemide   Per pt if she is off diuretic therapy , she has generalized edema.  She has been evaluated by cardiology, vascular and nephrology    She was seen by [facility] in 03/2020 , per note Aldo, renin, Cortisol was normal. 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     No hx of licorice intake   CT of the abdomen 09/2020 did NOT reveal any adrenal abnormality     Salivary cortisol x2 normal 11/2020   Mother and grandmother with hx of CVA  SUBJECTIVE:     Today (02/05/21):  [name] is here for a follow up on hypokalemia.   Constipation has resolved, hence no miralax use, now she has noted loose stools every other day   Continues with dizziness She is c/o of weight gain and would like to see a nutritionist   She still has breast soreness on the left   Spironolactone 50 mg daily  Kcl 20 Meq 2 tabs  four times a day    HISTORY:  Past Medical History:  Past Medical History:  Diagnosis Date   Allergy    Anemia    Endometriosis    History of echocardiogram    Echo 6/18: EF 60-65, no RWMA, normal  diastolic function   History of nuclear stress test    Myoview 6/18: EF 60, normal perfusion, Low Risk   History of UTI    Hypertension    pregnancy induced HTN/pre-eclampsia //  on meds since 2nd child (2000)   Past Surgical History:  Past Surgical History:  Procedure Laterality Date   ABDOMINAL HYSTERECTOMY  2003   partial   BREAST BIOPSY Left 10/01/2017   CARPAL TUNNEL RELEASE     right   GANGLION CYST EXCISION     right   Hammer Toe Repair Bilateral 12/29/2013   Lt #3, #4, Rt #2.    Social History:  reports that she has never smoked. She has never used smokeless tobacco. She reports that she does not drink alcohol and does not use drugs. Family History: family history includes Allergic rhinitis in her daughter, mother, and son; Allergies in her daughter and son; Arthritis in her maternal grandmother and mother; Coronary artery disease (age of onset: 15) in her maternal aunt and maternal aunt; Diabetes in her father; Heart attack (age of onset: 28) in her maternal grandmother; Hyperlipidemia in her mother; Hypertension in her father, maternal grandmother, and mother; Leukemia in her paternal grandmother; Stroke in her mother; Stroke (age of onset: 37)  in her maternal grandmother.   HOME MEDICATIONS: Allergies as of 02/05/2021       Reactions   Amoxicillin    rash   Ace Inhibitors    REACTION: lip swelling        Medication List        Accurate as of February 05, 2021  1:33 PM. If you have any questions, ask your nurse or doctor.          STOP taking these medications    spironolactone 50 MG tablet Commonly known as: ALDACTONE Stopped by: Dorita Sciara, MD       TAKE these medications    eplerenone 25 MG tablet Commonly known as: INSPRA Take 1 tablet (25 mg total) by mouth daily. Started by: Dorita Sciara, MD   methocarbamol 500 MG tablet Commonly known as: Robaxin Take 1 tablet (500 mg total) by mouth every 8 (eight) hours as needed for  muscle spasms.   multivitamin tablet Take 1 tablet by mouth daily.   potassium chloride SA 20 MEQ tablet Commonly known as: KLOR-CON M Take 3 tablets (60 mEq total) by mouth in the morning, at noon, in the evening, and at bedtime. What changed: how much to take Changed by: Dorita Sciara, MD          REVIEW OF SYSTEMS: A comprehensive ROS was conducted with the patient and is negative except as per HPI    OBJECTIVE:  VS: BP 124/80 (BP Location: Left Arm, Patient Position: Sitting, Cuff Size: Small)    Pulse 68    Ht 5\' 4"  (1.626 m)    Wt 185 lb (83.9 kg)    BMI 31.76 kg/m    Wt Readings from Last 3 Encounters:  02/05/21 185 lb (83.9 kg)  12/04/20 179 lb (81.2 kg)  11/06/20 180 lb (81.6 kg)     EXAM: General: Pt appears well and is in NAD  Neck: General: Supple without adenopathy. Thyroid: Thyroid size normal.  No goiter or nodules appreciated.   Lungs: Clear with good BS bilat with no rales, rhonchi, or wheezes  Heart: Auscultation: RRR.  Abdomen: Normoactive bowel sounds, soft, nontender, without masses or organomegaly palpable  Extremities:  BL LE: No pretibial edema normal ROM and strength.  Mental Status: Judgment, insight: Intact Memory: Intact for recent and remote events Mood and affect: No depression, anxiety, or agitation     DATA REVIEWED:     Latest Reference Range & Units 12/04/20 08:35 12/17/20 14:44  Sodium 135 - 145 mEq/L 138   Potassium 3.5 - 5.1 mEq/L 3.3 (L)   Chloride 96 - 112 mEq/L 98   CO2 19 - 32 mEq/L 32   Glucose 70 - 99 mg/dL 76   BUN 6 - 23 mg/dL 18   Creatinine 0.40 - 1.20 mg/dL 1.03   Calcium 8.4 - 10.5 mg/dL 9.7   Phosphorus 2.3 - 4.6 mg/dL 3.3   Magnesium 1.5 - 2.5 mg/dL 1.9   Alkaline Phosphatase 39 - 117 U/L 86 84  Albumin 3.5 - 5.2 g/dL 4.6 4.6  AST 0 - 37 U/L 14 15  ALT 0 - 35 U/L 14 17  Total Protein 6.0 - 8.3 g/dL 7.6 8.3  Bilirubin, Direct 0.0 - 0.3 mg/dL  0.1  Total Bilirubin 0.2 - 1.2 mg/dL 0.4 0.2  GFR  >60.00 mL/min 66.48     Latest Reference Range & Units 12/04/20 08:35  TSH 0.35 - 5.50 uIU/mL 1.39  T4,Free(Direct) 0.60 - 1.60 ng/dL 0.83  CT Abdomen 10/19/2020 FINDINGS: Lower chest: There is atelectasis in the lung bases.   Hepatobiliary: No focal liver abnormality is seen. No gallstones, gallbladder wall thickening, or biliary dilatation.   Pancreas: Unremarkable. No pancreatic ductal dilatation or surrounding inflammatory changes.   Spleen: Normal in size without focal abnormality.   Adrenals/Urinary Tract: Bilateral adrenal glands are within normal limits.   Stomach/Bowel: Small gastric fundal diverticulum is unchanged. Stomach is otherwise within normal limits. Appendix appears normal. No evidence of bowel wall thickening, distention, or inflammatory changes.   Vascular/Lymphatic: No significant vascular findings are present. No enlarged abdominal or pelvic lymph nodes.   Reproductive: Status post hysterectomy. No adnexal masses.   Other: No abdominal wall hernia or abnormality. No abdominopelvic ascites. There is a small fat containing umbilical hernia. There is no ascites.   Musculoskeletal: No acute or significant osseous findings.   IMPRESSION: 1. No acute localizing process in the abdomen or pelvis.  ASSESSMENT/PLAN/RECOMMENDATIONS:   Spontaneous Hypokalemia :  - We discussed endocrine D/D of hypokalemia to include hyperaldosteronism, cushing syndrome or apparently mineralocorticoid activity . Salivary cortisol testing was negative for cushing syndrome  - CT imaging of abdomen showed normal adrenal glands 2022 - Will proceed with stopping spironolactone  so we can check aldo and renin in 6 weeks. In the meantime she will increase her potassium and have this recheck in 1 weeks  -  Medication  Stop spironolactone  Increase  Kcl 20 mg 3 tabs Q AM, 4 tabs during the day and 3 tabs in the evening      2. Mastalgia :   - Most likely secondary to  Spironolactone, will switch to Epleronone  , after 6 weeks  - Will also check prolactin , no hx of galactorrhea   3. Weight gain :  -  I have advised her to check with insurance about their coverage for a dietary visit for weight loss  - I have offered a weight loss clinic referral  - She was also advised to follow a  diet ( weight watchers, noom etc )  - She was encouraged to continue with exercise.  - TFT's normal    F/U in 3 months  Labs in 1 week and  will need aldo: renin in 6 weeks   Signed electronically by: Mack Guise, MD  [facility]  [facility] [address] [place], [place] [address] Phone: [phone] FAX: [phone]   CC: Debbrah Alar, [address] [place] [address] Phone: [phone] Fax: [phone]   Return to Endocrinology clinic as below: Future Appointments  Date Time Provider [place]  02/14/2021  7:15 AM [facility]  03/20/2021  7:15 AM [facility]  03/22/2021  8:00 AM Debbrah Alar, NP [facility]  05/07/2021  8:50 AM Briton Sellman, Melanie Crazier, MD [facility]

## 2021-02-05 ENCOUNTER — Ambulatory Visit: Payer: BC Managed Care – PPO | Admitting: Internal Medicine

## 2021-02-05 ENCOUNTER — Encounter: Payer: Self-pay | Admitting: Internal Medicine

## 2021-02-05 VITALS — BP 124/80 | HR 68 | Ht 64.0 in | Wt 185.0 lb

## 2021-02-05 DIAGNOSIS — N644 Mastodynia: Secondary | ICD-10-CM

## 2021-02-05 DIAGNOSIS — E876 Hypokalemia: Secondary | ICD-10-CM

## 2021-02-05 DIAGNOSIS — R635 Abnormal weight gain: Secondary | ICD-10-CM | POA: Diagnosis not present

## 2021-02-05 MED ORDER — EPLERENONE 25 MG PO TABS: 25.0000 mg | ORAL_TABLET | Freq: Every day | ORAL | 6 refills | Status: DC

## 2021-02-05 MED ORDER — POTASSIUM CHLORIDE CRYS ER 20 MEQ PO TBCR: 60.0000 meq | EXTENDED_RELEASE_TABLET | Freq: Four times a day (QID) | ORAL | 6 refills | Status: DC

## 2021-02-05 NOTE — Patient Instructions (Addendum)
STOP Spironolactone  Take Potassium 3 tablet in the Morning and evening and 4 tablets during the day ( total 10 tablets  daily )    You will need labs in 1 week and again in 6 weeks    After the 6 weeks labs start Eplerenone 25 mg daily and decrease Potassium to TWO tablets Four times a day

## 2021-02-14 ENCOUNTER — Other Ambulatory Visit (INDEPENDENT_AMBULATORY_CARE_PROVIDER_SITE_OTHER): Payer: BC Managed Care – PPO

## 2021-02-14 DIAGNOSIS — N644 Mastodynia: Secondary | ICD-10-CM | POA: Diagnosis not present

## 2021-02-14 DIAGNOSIS — E876 Hypokalemia: Secondary | ICD-10-CM | POA: Diagnosis not present

## 2021-02-14 LAB — POTASSIUM: Potassium: 2.9 mEq/L — ABNORMAL LOW (ref 3.5–5.1)

## 2021-02-14 LAB — PROLACTIN: Prolactin: 16.8 ng/mL

## 2021-02-15 NOTE — Addendum Note (Signed)
Addended by: Dorita Sciara on: 02/15/2021 10:35 AM   Modules accepted: Orders

## 2021-02-18 ENCOUNTER — Other Ambulatory Visit: Payer: Self-pay

## 2021-02-18 NOTE — Telephone Encounter (Signed)
Needs labs as ordered please for her form.

## 2021-02-19 ENCOUNTER — Other Ambulatory Visit (INDEPENDENT_AMBULATORY_CARE_PROVIDER_SITE_OTHER): Payer: BC Managed Care – PPO

## 2021-02-19 DIAGNOSIS — Z0184 Encounter for antibody response examination: Secondary | ICD-10-CM

## 2021-02-19 DIAGNOSIS — Z111 Encounter for screening for respiratory tuberculosis: Secondary | ICD-10-CM

## 2021-02-20 LAB — MEASLES/MUMPS/RUBELLA IMMUNITY
Mumps IgG: 35.2 AU/mL
Rubella: 2.33 Index
Rubeola IgG: 126 AU/mL

## 2021-02-20 LAB — VARICELLA ZOSTER ANTIBODY, IGG: Varicella IgG: 836.8 index

## 2021-02-20 LAB — HEPATITIS B SURFACE ANTIBODY, QUANTITATIVE: Hep B S AB Quant (Post): 71 m[IU]/mL (ref >=10)

## 2021-02-20 NOTE — Telephone Encounter (Signed)
Nicole Quinn, see mychart message re: titers.

## 2021-02-22 LAB — QUANTIFERON-TB GOLD PLUS
Mitogen-NIL: >10 IU/mL
NIL: 0.08 IU/mL
QuantiFERON-TB Gold Plus: NEGATIVE
TB1-NIL: <0 IU/mL
TB2-NIL: <0 IU/mL

## 2021-02-27 ENCOUNTER — Encounter: Payer: Self-pay | Admitting: Internal Medicine

## 2021-02-27 ENCOUNTER — Encounter: Payer: Self-pay | Admitting: Family

## 2021-02-28 MED ORDER — OMEPRAZOLE MAGNESIUM 20 MG PO TBEC: 20.0000 mg | DELAYED_RELEASE_TABLET | Freq: Every day | ORAL | 2 refills | Status: DC

## 2021-03-01 ENCOUNTER — Other Ambulatory Visit (INDEPENDENT_AMBULATORY_CARE_PROVIDER_SITE_OTHER): Payer: BC Managed Care – PPO

## 2021-03-01 DIAGNOSIS — E876 Hypokalemia: Secondary | ICD-10-CM

## 2021-03-01 LAB — POTASSIUM: Potassium: 3.5 mmol/L (ref 3.5–5.3)

## 2021-03-01 NOTE — Addendum Note (Signed)
Addended by: Manuela Schwartz on: 03/01/2021 03:08 PM   Modules accepted: Orders

## 2021-03-12 ENCOUNTER — Other Ambulatory Visit: Payer: Self-pay

## 2021-03-14 ENCOUNTER — Encounter: Payer: Self-pay | Admitting: Internal Medicine

## 2021-03-20 ENCOUNTER — Other Ambulatory Visit (INDEPENDENT_AMBULATORY_CARE_PROVIDER_SITE_OTHER): Payer: BC Managed Care – PPO

## 2021-03-20 DIAGNOSIS — D649 Anemia, unspecified: Secondary | ICD-10-CM

## 2021-03-20 DIAGNOSIS — E876 Hypokalemia: Secondary | ICD-10-CM | POA: Diagnosis not present

## 2021-03-20 LAB — HEPATIC FUNCTION PANEL
ALT: 14 U/L (ref 0–35)
AST: 17 U/L (ref 0–37)
Albumin: 4.7 g/dL (ref 3.5–5.2)
Alkaline Phosphatase: 94 U/L (ref 39–117)
Bilirubin, Direct: 0.1 mg/dL (ref 0.0–0.3)
Total Bilirubin: 0.3 mg/dL (ref 0.2–1.2)
Total Protein: 8.1 g/dL (ref 6.0–8.3)

## 2021-03-22 ENCOUNTER — Encounter: Payer: BC Managed Care – PPO | Admitting: Family

## 2021-03-22 LAB — ANTI-SMOOTH MUSCLE ANTIBODY, IGG: Actin (Smooth Muscle) Antibody (IGG): 25 U — ABNORMAL HIGH (ref <20)

## 2021-03-25 LAB — ALDOSTERONE + RENIN ACTIVITY W/ RATIO
ALDO / PRA Ratio: 1.3 Ratio (ref 0.9–28.9)
Aldosterone: 35 ng/dL
Renin Activity: 27.69 ng/mL/h — ABNORMAL HIGH (ref 0.25–5.82)

## 2021-03-26 ENCOUNTER — Ambulatory Visit (INDEPENDENT_AMBULATORY_CARE_PROVIDER_SITE_OTHER): Payer: BC Managed Care – PPO | Admitting: Family

## 2021-03-26 ENCOUNTER — Encounter: Payer: Self-pay | Admitting: Family

## 2021-03-26 VITALS — BP 127/82 | HR 72 | Temp 98.4°F | Resp 16 | Wt 192.0 lb

## 2021-03-26 DIAGNOSIS — Z0000 Encounter for general adult medical examination without abnormal findings: Secondary | ICD-10-CM

## 2021-03-26 DIAGNOSIS — E785 Hyperlipidemia, unspecified: Secondary | ICD-10-CM

## 2021-03-26 DIAGNOSIS — E876 Hypokalemia: Secondary | ICD-10-CM | POA: Diagnosis not present

## 2021-03-26 DIAGNOSIS — D649 Anemia, unspecified: Secondary | ICD-10-CM

## 2021-03-26 LAB — LIPID PANEL
Cholesterol: 227 mg/dL — ABNORMAL HIGH (ref 0–200)
HDL: 53.6 mg/dL (ref >39.00)
LDL Cholesterol: 158 mg/dL — ABNORMAL HIGH (ref 0–99)
NonHDL: 172.98
Total CHOL/HDL Ratio: 4
Triglycerides: 77 mg/dL (ref 0.0–149.0)
VLDL: 15.4 mg/dL (ref 0.0–40.0)

## 2021-03-26 LAB — BASIC METABOLIC PANEL
BUN: 15 mg/dL (ref 6–23)
CO2: 30 mEq/L (ref 19–32)
Calcium: 9.5 mg/dL (ref 8.4–10.5)
Chloride: 100 mEq/L (ref 96–112)
Creatinine, Ser: 0.82 mg/dL (ref 0.40–1.20)
GFR: 87.22 mL/min (ref >60.00)
Glucose, Bld: 74 mg/dL (ref 70–99)
Potassium: 2.9 mEq/L — ABNORMAL LOW (ref 3.5–5.1)
Sodium: 139 mEq/L (ref 135–145)

## 2021-03-26 LAB — CBC WITH DIFFERENTIAL/PLATELET
Basophils Absolute: 0 10*3/uL (ref 0.0–0.1)
Basophils Relative: 0.6 % (ref 0.0–3.0)
Eosinophils Absolute: 0.1 10*3/uL (ref 0.0–0.7)
Eosinophils Relative: 0.7 % (ref 0.0–5.0)
HCT: 32.4 % — ABNORMAL LOW (ref 36.0–46.0)
Hemoglobin: 10.9 g/dL — ABNORMAL LOW (ref 12.0–15.0)
Lymphocytes Relative: 43.9 % (ref 12.0–46.0)
Lymphs Abs: 3.1 10*3/uL (ref 0.7–4.0)
MCHC: 33.7 g/dL (ref 30.0–36.0)
MCV: 91.8 fl (ref 78.0–100.0)
Monocytes Absolute: 0.4 10*3/uL (ref 0.1–1.0)
Monocytes Relative: 5.6 % (ref 3.0–12.0)
Neutro Abs: 3.5 10*3/uL (ref 1.4–7.7)
Neutrophils Relative %: 49.2 % (ref 43.0–77.0)
Platelets: 343 10*3/uL (ref 150.0–400.0)
RBC: 3.53 Mil/uL — ABNORMAL LOW (ref 3.87–5.11)
RDW: 13.2 % (ref 11.5–15.5)
WBC: 7.1 10*3/uL (ref 4.0–10.5)

## 2021-03-26 LAB — VITAMIN B12: Vitamin B-12: >1504 pg/mL — ABNORMAL HIGH (ref 211–911)

## 2021-03-26 LAB — FERRITIN: Ferritin: 66.2 ng/mL (ref 10.0–291.0)

## 2021-03-26 LAB — FOLATE: Folate: >24.2 ng/mL (ref >5.9)

## 2021-03-26 LAB — IRON: Iron: 77 ug/dL (ref 42–145)

## 2021-03-26 NOTE — Assessment & Plan Note (Signed)
Continue work on [employer], exercise.  Encouraged her to get the bivalent covid booster.  She knows that next mammo will be due in august. Plan to start screening colonoscopies next year.  ?

## 2021-03-26 NOTE — Patient Instructions (Signed)
Please complete lab work prior to leaving.   

## 2021-03-26 NOTE — Progress Notes (Signed)
Subjective:   By signing my name below, I, [name], attest that this documentation has been prepared under the direction and in the presence of [name], 03/26/2021   Patient ID: Nicole Quinn, female    DOB: July 12, 1975, 46 y.o.   MRN: [id number]  Chief Complaint  Patient presents with   Annual Exam    HPI Patient is in today for a comprehensive physical exam.  Reflux - Patient does not have reflux. She does not take omeprazole.   Social History: Patient reports no new changes to family history. Mammogram: Last completed 11/27/2020. Immunizations: Patient is recommended to take the bivalent COVID - 19 booster vaccination.  Diet/Exercise: Patient has not been exercising regularly due to exhaustion. She states that potassium chloride SA does not help with the feeling of exhaustion. Weight has been consistent. Wt Readings from Last 3 Encounters:  03/26/21 192 lb (87.1 kg)  02/05/21 185 lb (83.9 kg)  12/04/20 179 lb (81.2 kg)  Dental/Vision: Patient is up to date on dental/vision exams.   She denies having any fever, ear pain, new muscle pain, joint pain, new moles, congestion, sinus pain, sore throat, palpations, wheezing, n/v/d, constipation, blood in stool, dysuria, frequency, hematuria at this time.  Health Maintenance Due  Topic Date Due   COVID-19 Vaccine (3 - Booster) 12/17/2019    Past Medical History:  Diagnosis Date   Allergy    Anemia    Endometriosis    History of echocardiogram    Echo 6/18: EF 60-65, no RWMA, normal diastolic function   History of nuclear stress test    Myoview 6/18: EF 60, normal perfusion, Low Risk   History of UTI    Hypertension    pregnancy induced HTN/pre-eclampsia //  on meds since 2nd child (2000)    Past Surgical History:  Procedure Laterality Date   ABDOMINAL HYSTERECTOMY  2003   partial   BREAST BIOPSY Left 10/01/2017   CARPAL TUNNEL RELEASE     right   GANGLION CYST EXCISION     right   Hammer Toe  Repair Bilateral 12/29/2013   Lt #3, #4, Rt #2.    Family History  Problem Relation Age of Onset   Allergies Daughter    Allergic rhinitis Daughter    Allergies Son    Allergic rhinitis Son    Leukemia Paternal Grandmother    Arthritis Mother    Hyperlipidemia Mother    Hypertension Mother    Stroke Mother    Allergic rhinitis Mother    Hypertension Father    Diabetes Father    Arthritis Maternal Grandmother    Stroke Maternal Grandmother 36   Hypertension Maternal Grandmother    Heart attack Maternal Grandmother 37   Coronary artery disease Maternal Aunt 50   Coronary artery disease Maternal Aunt 50   Angioedema Neg Hx    Asthma Neg Hx    Eczema Neg Hx    Immunodeficiency Neg Hx    Urticaria Neg Hx     Social History   Socioeconomic History   Marital status: Married    Spouse name: Not on file   Number of children: 2   Years of education: Not on file   Highest education level: Not on file  Occupational History    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   Married.  Lives with one child.  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     She is in nursing school      Social Determinants of Health   Financial Resource Strain: Not on file  Food Insecurity: Not on file  Transportation Needs: Not on file  Physical Activity: Not on file  Stress: Not on file  Social Connections: Not on file  Intimate Partner Violence: Not on file    Outpatient Medications Prior to Visit  Medication Sig Dispense Refill   eplerenone (INSPRA) 25 MG tablet Take 1 tablet (25 mg total) by mouth daily. 30 tablet 6    Multiple Vitamin (MULTIVITAMIN) tablet Take 1 tablet by mouth daily.     potassium chloride SA (KLOR-CON M) 20 MEQ tablet Take 3 tablets (60 mEq total) by mouth in the morning, at noon, in the evening, and at bedtime. 360 tablet 6   omeprazole (PRILOSEC OTC) 20 MG tablet Take 1 tablet (20 mg total) by mouth daily. 30 tablet 2   methocarbamol (ROBAXIN) 500 MG tablet Take 1 tablet (500 mg total) by mouth every 8 (eight) hours as needed for muscle spasms. 30 tablet 0   No facility-administered medications prior to visit.    Allergies  Allergen Reactions   Amoxicillin     rash   Ace Inhibitors     REACTION: lip swelling    Review of Systems  Constitutional:  Negative for fever.  HENT:  Negative for congestion, ear pain, sinus pain and sore throat.   Respiratory:  Negative for wheezing.   Cardiovascular:  Negative for palpitations.  Gastrointestinal:  Negative for blood in stool, constipation, diarrhea, nausea and vomiting.  Genitourinary:  Negative for dysuria, frequency and hematuria.  Musculoskeletal:  Negative for joint pain and myalgias.  Skin:  Negative for rash.       (-) New Moles      Objective:    Physical Exam Constitutional:      General: She is not in acute distress.    Appearance: Normal appearance. She is not ill-appearing.  HENT:     Head: Normocephalic and atraumatic.     Right Ear: Tympanic membrane, ear canal and external ear normal.     Left Ear: Tympanic membrane, ear canal and external ear normal.  Eyes:     Extraocular Movements: Extraocular movements intact.     Pupils: Pupils are equal, round, and reactive to light.  Cardiovascular:     Rate and Rhythm: Normal rate and regular rhythm.     Heart sounds: Normal heart sounds. No murmur heard.   No gallop.  Pulmonary:     Effort: Pulmonary effort is normal. No respiratory distress.     Breath sounds: Normal breath sounds. No wheezing or rales.  Abdominal:     General: Bowel sounds are normal. There  is no distension.     Palpations: Abdomen is soft.     Tenderness: There is no abdominal tenderness. There is no guarding.  Musculoskeletal:     Comments: 5/5 strength in both upper and lower extremities   Skin:    General: Skin is warm and dry.  Neurological:     Mental Status: She is alert and oriented  to person, place, and time.     Deep Tendon Reflexes:     Reflex Scores:      Patellar reflexes are 2+ on the right side and 2+ on the left side. Psychiatric:        Judgment: Judgment normal.    BP 127/82 (BP Location: Right Arm, Patient Position: Sitting, Cuff Size: Small)    Pulse 72    Temp 98.4 F (36.9 C) (Oral)    Resp 16    Wt 192 lb (87.1 kg)    SpO2 100%    BMI 32.96 kg/m  Wt Readings from Last 3 Encounters:  03/26/21 192 lb (87.1 kg)  02/05/21 185 lb (83.9 kg)  12/04/20 179 lb (81.2 kg)       Assessment & Plan:   Problem List Items Addressed This Visit       Unprioritized   Routine general medical examination at a health care facility    Continue work on healthy diet, exercise.  Encouraged her to get the bivalent covid booster.  She knows that next mammo will be due in august. Plan to start screening colonoscopies next year.       Hypokalemia   Relevant Orders   Basic metabolic panel   Chronic hypokalemia   Other Visit Diagnoses     Anemia, unspecified type    -  Primary   Relevant Orders   CBC with Differential/Platelet   Iron   Ferritin   B12   Folate   Hyperlipidemia, unspecified hyperlipidemia type       Relevant Orders   Lipid panel        No orders of the defined types were placed in this encounter.   I, [name], NP, personally preformed the services described in this documentation.  All medical record entries made by the scribe were at my direction and in my presence.  I have reviewed the chart and discharge instructions (if applicable) and agree that the record reflects my personal performance and is accurate and complete.  03/26/2021  I,Amber Collins,acting as a scribe for [name], NP.,have documented all relevant documentation on the behalf of [name], NP,as directed by  [name], NP while in the presence of [name], NP.    [name], NP

## 2021-03-26 NOTE — Assessment & Plan Note (Deleted)
S 

## 2021-03-28 ENCOUNTER — Telehealth: Payer: Self-pay | Admitting: Family

## 2021-03-29 ENCOUNTER — Telehealth: Payer: Self-pay | Admitting: Family

## 2021-03-29 ENCOUNTER — Other Ambulatory Visit: Payer: Self-pay

## 2021-03-29 DIAGNOSIS — E876 Hypokalemia: Secondary | ICD-10-CM

## 2021-03-29 DIAGNOSIS — D649 Anemia, unspecified: Secondary | ICD-10-CM

## 2021-03-29 MED ORDER — POTASSIUM CHLORIDE CRYS ER 20 MEQ PO TBCR: 100.0000 meq | EXTENDED_RELEASE_TABLET | Freq: Two times a day (BID) | ORAL | 6 refills | Status: DC

## 2021-03-29 NOTE — Telephone Encounter (Addendum)
Cholesterol and triglycerides are high. Please continue to work on a low fat/low cholesterol diet limiting carbs/sweets. ? ?Potassium is low.  Please continue spironolactone and increase kdur to 5 tabs bid.  Repeat bmet in 1 week, dx hypokalemia.  ? ?She remains anemic.  Iron, b12 and folate look good.  I would like for her to complete an IFOB please.  ?

## 2021-03-29 NOTE — Telephone Encounter (Signed)
Lvm for patient to be aware she has new information about her labs with instructions from the provider and was going to be informed also by mychart.  ?This information was sent to patient via MyChart with all the details. Orders for bment and ifob entered as future.  ?

## 2021-03-29 NOTE — Telephone Encounter (Incomplete Revision)
Cholesterol and triglycerides are high. Please continue to work on a low fat/low cholesterol diet limiting carbs/sweets.  Potassium is low.  Please continue spironolactone and increase kdur to 5 tabs bid.  Repeat bmet in 1 week, dx hypokalemia.   She remains anemic.  Iron, b12 and folate look good.  I would like for her to complete an IFOB please.

## 2021-03-29 NOTE — Telephone Encounter (Signed)
Cholesterol and triglycerides are high. Please continue to work on a low fat/low cholesterol diet limiting carbs/sweets. ?

## 2021-03-29 NOTE — Telephone Encounter (Signed)
Opened in error

## 2021-03-29 NOTE — Telephone Encounter (Deleted)
Cholesterol and triglycerides are elevated. Please work on low fat diet and limiting white fluffy carbs/sugar. ? ?Potassium is low.   ?

## 2021-04-05 ENCOUNTER — Other Ambulatory Visit: Payer: Self-pay | Admitting: Family

## 2021-04-05 ENCOUNTER — Other Ambulatory Visit (INDEPENDENT_AMBULATORY_CARE_PROVIDER_SITE_OTHER): Payer: BC Managed Care – PPO

## 2021-04-05 ENCOUNTER — Telehealth: Payer: Self-pay | Admitting: Family

## 2021-04-05 DIAGNOSIS — E876 Hypokalemia: Secondary | ICD-10-CM | POA: Diagnosis not present

## 2021-04-05 DIAGNOSIS — N289 Disorder of kidney and ureter, unspecified: Secondary | ICD-10-CM

## 2021-04-05 LAB — HEPATIC FUNCTION PANEL
ALT: 19 U/L (ref 0–35)
AST: 19 U/L (ref 0–37)
Albumin: 5.4 g/dL — ABNORMAL HIGH (ref 3.5–5.2)
Alkaline Phosphatase: 94 U/L (ref 39–117)
Bilirubin, Direct: 0.1 mg/dL (ref 0.0–0.3)
Total Bilirubin: 0.3 mg/dL (ref 0.2–1.2)
Total Protein: 9.2 g/dL — ABNORMAL HIGH (ref 6.0–8.3)

## 2021-04-05 LAB — BASIC METABOLIC PANEL
BUN: 28 mg/dL — ABNORMAL HIGH (ref 6–23)
CO2: 27 mEq/L (ref 19–32)
Calcium: 11.2 mg/dL — ABNORMAL HIGH (ref 8.4–10.5)
Chloride: 92 mEq/L — ABNORMAL LOW (ref 96–112)
Creatinine, Ser: 1.71 mg/dL — ABNORMAL HIGH (ref 0.40–1.20)
GFR: 36.1 mL/min — ABNORMAL LOW (ref >60.00)
Glucose, Bld: 112 mg/dL — ABNORMAL HIGH (ref 70–99)
Potassium: 4.7 mEq/L (ref 3.5–5.1)
Sodium: 132 mEq/L — ABNORMAL LOW (ref 135–145)

## 2021-04-05 MED ORDER — SPIRONOLACTONE 50 MG PO TABS: 50.0000 mg | ORAL_TABLET | Freq: Two times a day (BID) | ORAL | Status: DC

## 2021-04-05 NOTE — Addendum Note (Signed)
Addended by: Kelle Darting A on: 04/05/2021 02:12 PM ? ? Modules accepted: Orders ? ?

## 2021-04-05 NOTE — Telephone Encounter (Signed)
Appointment will be Tuesday at 1:20 ?

## 2021-04-05 NOTE — Telephone Encounter (Signed)
Left message for patient to check her mychart messages. ? ?Rod Holler Can you please put her on the schedule for 12 noon Monday with me?  ?

## 2021-04-08 ENCOUNTER — Ambulatory Visit: Payer: BC Managed Care – PPO | Admitting: Family

## 2021-04-09 ENCOUNTER — Ambulatory Visit (INDEPENDENT_AMBULATORY_CARE_PROVIDER_SITE_OTHER): Payer: BC Managed Care – PPO | Admitting: Family

## 2021-04-09 VITALS — BP 120/85 | HR 81 | Temp 98.1°F | Resp 16 | Wt 184.0 lb

## 2021-04-09 DIAGNOSIS — N183 Chronic kidney disease, stage 3 unspecified: Secondary | ICD-10-CM | POA: Insufficient documentation

## 2021-04-09 DIAGNOSIS — K5909 Other constipation: Secondary | ICD-10-CM | POA: Diagnosis not present

## 2021-04-09 DIAGNOSIS — E876 Hypokalemia: Secondary | ICD-10-CM

## 2021-04-09 DIAGNOSIS — N289 Disorder of kidney and ureter, unspecified: Secondary | ICD-10-CM | POA: Diagnosis not present

## 2021-04-09 HISTORY — DX: Chronic kidney disease, stage 3 unspecified: N18.30

## 2021-04-09 LAB — COMPREHENSIVE METABOLIC PANEL
ALT: 14 U/L (ref 0–35)
AST: 16 U/L (ref 0–37)
Albumin: 4.9 g/dL (ref 3.5–5.2)
Alkaline Phosphatase: 79 U/L (ref 39–117)
BUN: 21 mg/dL (ref 6–23)
CO2: 29 mEq/L (ref 19–32)
Calcium: 10.5 mg/dL (ref 8.4–10.5)
Chloride: 95 mEq/L — ABNORMAL LOW (ref 96–112)
Creatinine, Ser: 1.09 mg/dL (ref 0.40–1.20)
GFR: 61.97 mL/min (ref >60.00)
Glucose, Bld: 80 mg/dL (ref 70–99)
Potassium: 4.6 mEq/L (ref 3.5–5.1)
Sodium: 134 mEq/L — ABNORMAL LOW (ref 135–145)
Total Bilirubin: 0.4 mg/dL (ref 0.2–1.2)
Total Protein: 7.7 g/dL (ref 6.0–8.3)

## 2021-04-09 NOTE — Assessment & Plan Note (Signed)
Uncontrolled. Recommended dose of Miralax tonight and call if no BM tomorrow.  ?

## 2021-04-09 NOTE — Assessment & Plan Note (Signed)
New.  Will repeat CMET today. Further recommendations following review of these results. Continue to hold aldactone for now.  ?

## 2021-04-09 NOTE — Patient Instructions (Signed)
Please complete lab work prior to leaving.   

## 2021-04-09 NOTE — Progress Notes (Signed)
? ?Subjective:  ? ?By signing my name below, I, Nicole Quinn, attest that this documentation has been prepared under the direction and in the presence of Nicole Alar NP, 04/09/2021   ? ? Patient ID: Nicole Quinn, female    DOB: [birth year], 46 y.o.   MRN: [id number] ? ?Chief Complaint  ?Patient presents with  ? Follow-up  ?  To go over abnormal labs  ? Constipation  ?  Reports no bm in about one week  ? ? ?HPI ?Patient is in today for an office visit.  ? ?Kidney Function - Last visit her Cr had elevated from her baseline to 1.7.  She denies being dehydrated, using antiinflammatory medications, or consuming any dietary supplements. She states that she has been urinating well. She was taking 50 MG of Aldactone twice daily until we asked her to stop last week.  ?Lab Results  ?Component Value Date  ? CREATININE 1.71 (H) 04/05/2021  ? BUN 28 (H) 04/05/2021  ? NA 132 (L) 04/05/2021  ? K 4.7 04/05/2021  ? CL 92 (L) 04/05/2021  ? CO2 27 04/05/2021  ? ?Potassium - She is taking Kdur 39mEQ 4 tabs bid.  ? ?Lab Results  ?Component Value Date  ? K 4.7 04/05/2021  ? ?Constipation- she has not had a BM in >1 week.  ? ?Health Maintenance Due  ?Topic Date Due  ? COVID-19 Vaccine (3 - Mixed Product risk series) 11/19/2019  ? ? ?Past Medical History:  ?Diagnosis Date  ? Allergy   ? Anemia   ? Endometriosis   ? History of echocardiogram   ? Echo 6/18: EF 60-65, no RWMA, normal diastolic function  ? History of nuclear stress test   ? Myoview 6/18: EF 60, normal perfusion, Low Risk  ? History of UTI   ? Hypertension   ? pregnancy induced HTN/pre-eclampsia //  on meds since 2nd child (2000)  ? ? ?Past Surgical History:  ?Procedure Laterality Date  ? ABDOMINAL HYSTERECTOMY  2003  ? partial  ? BREAST BIOPSY Left 10/01/2017  ? CARPAL TUNNEL RELEASE    ? right  ? GANGLION CYST EXCISION    ? right  ? Hammer Toe Repair Bilateral 12/29/2013  ? Lt #3, #4, Rt #2.  ? ? ?Family History  ?Problem Relation Age of Onset  ? Allergies Daughter    ? Allergic rhinitis Daughter   ? Allergies Son   ? Allergic rhinitis Son   ? Leukemia Paternal Grandmother   ? Arthritis Mother   ? Hyperlipidemia Mother   ? Hypertension Mother   ? Stroke Mother   ? Allergic rhinitis Mother   ? Hypertension Father   ? Diabetes Father   ? Arthritis Maternal Grandmother   ? Stroke Maternal Grandmother 50  ? Hypertension Maternal Grandmother   ? Heart attack Maternal Grandmother 50  ? Coronary artery disease Maternal Aunt 50  ? Coronary artery disease Maternal Aunt 50  ? Angioedema Neg Hx   ? Asthma Neg Hx   ? Eczema Neg Hx   ? Immunodeficiency Neg Hx   ? Urticaria Neg Hx   ? ? ?Social History  ? ?Socioeconomic History  ? Marital status: Married  ?  Spouse name: Not on file  ? Number of children: 2  ? Years of education: Not on file  ? Highest education level: Not on file  ?Occupational History  ?  Employer: [employer].  ?  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 Smoking  status: Never  ? Smokeless tobacco: Never  ?Vaping Use  ? Vaping Use: Never used  ?Substance and Sexual Activity  ? Alcohol use: No  ?  Alcohol/week: 0.0 standard drinks  ? Drug use: No  ? Sexual activity: Not on file  ?Other Topics Concern  ? Not on file  ?Social History Narrative  ? Regular exercise: yes.   Married.  Lives with one child.  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    ? She is in nursing school  ?   ? ?Social Determinants of Health  ? ?Financial Resource Strain: Not on file  ?Food Insecurity: Not on file  ?Transportation Needs: Not on file  ?Physical Activity: Not on file  ?Stress: Not on file  ?Social Connections: Not on file  ?Intimate Partner Violence: Not on file  ? ? ?Outpatient Medications Prior to Visit  ?Medication Sig Dispense Refill  ? Multiple Vitamin (MULTIVITAMIN) tablet Take 1 tablet by mouth daily.    ? potassium chloride SA (KLOR-CON M) 20 MEQ tablet Take 5 tablets (100 mEq total) by mouth 2 (two) times daily. 360 tablet 6  ? spironolactone  (ALDACTONE) 50 MG tablet Take 1 tablet (50 mg total) by mouth 2 (two) times daily.    ? ?No facility-administered medications prior to visit.  ? ? ?Allergies  ?Allergen Reactions  ? Amoxicillin   ?  rash  ? Ace Inhibitors   ?  REACTION: lip swelling  ? ? ?ROS ?See HPI ?   ?Objective:  ?  ?Physical Exam ?Constitutional:   ?   General: She is not in acute distress. ?   Appearance: Normal appearance. She is not ill-appearing.  ?HENT:  ?   Head: Normocephalic and atraumatic.  ?   Right Ear: External ear normal.  ?   Left Ear: External ear normal.  ?Eyes:  ?   Extraocular Movements: Extraocular movements intact.  ?   Pupils: Pupils are equal, round, and reactive to light.  ?Cardiovascular:  ?   Rate and Rhythm: Normal rate and regular rhythm.  ?   Heart sounds: Normal heart sounds. No murmur heard. ?  No gallop.  ?Pulmonary:  ?   Effort: Pulmonary effort is normal. No respiratory distress.  ?   Breath sounds: Normal breath sounds. No wheezing or rales.  ?Abdominal:  ?   General: Bowel sounds are normal. There is no distension.  ?   Palpations: Abdomen is soft.  ?   Tenderness: There is no abdominal tenderness. There is no guarding.  ?Skin: ?   General: Skin is warm and dry.  ?Neurological:  ?   Mental Status: She is alert and oriented to person, place, and time.  ?Psychiatric:     ?   Mood and Affect: Mood normal.     ?   Behavior: Behavior normal.     ?   Judgment: Judgment normal.  ? ? ?BP 120/85   Pulse 81   Temp 98.1 ?F (36.7 ?C) (Oral)   Resp 16   Wt 184 lb (83.5 kg)   SpO2 100%   BMI 31.58 kg/m?  ?Wt Readings from Last 3 Encounters:  ?04/09/21 184 lb (83.5 kg)  ?03/26/21 192 lb (87.1 kg)  ?02/05/21 185 lb (83.9 kg)  ? ? ?   ?Assessment & Plan:  ? ?Problem List Items Addressed This Visit   ? ?  ? Unprioritized  ? Renal insufficiency - Primary  ?  New.  Will repeat CMET today. Further recommendations  following review of these results. Continue to hold aldactone for now.  ?  ?  ? Relevant Orders  ? Comp Met  (CMET)  ? Hypokalemia  ?  Last K+ was upper limit of normal. Repeat K+ today.  ?  ?  ? Chronic constipation  ?  Uncontrolled. Recommended dose of Miralax tonight and call if no BM tomorrow.  ?  ?  ? ? ? ? ?No orders of the defined types were placed in this encounter. ? ? ?I, [name], NP, personally preformed the services described in this documentation.  All medical record entries made by the scribe were at my direction and in my presence.  I have reviewed the chart and discharge instructions (if applicable) and agree that the record reflects my personal performance and is accurate and complete. 04/09/2021 ? ? ?I,Amber Collins,acting as a Education administrator for [employer], NP.,have documented all relevant documentation on the behalf of [name], NP,as directed by  [name], NP while in the presence of [name], NP. ? ? ? ?[name], NP ? ?

## 2021-04-09 NOTE — Assessment & Plan Note (Signed)
Last K+ was upper limit of normal. Repeat K+ today.  ?

## 2021-04-10 ENCOUNTER — Telehealth: Payer: Self-pay | Admitting: Family

## 2021-04-10 ENCOUNTER — Other Ambulatory Visit: Payer: Self-pay

## 2021-04-10 ENCOUNTER — Other Ambulatory Visit (INDEPENDENT_AMBULATORY_CARE_PROVIDER_SITE_OTHER): Payer: BC Managed Care – PPO

## 2021-04-10 DIAGNOSIS — D649 Anemia, unspecified: Secondary | ICD-10-CM

## 2021-04-10 DIAGNOSIS — E876 Hypokalemia: Secondary | ICD-10-CM

## 2021-04-10 MED ORDER — SPIRONOLACTONE 50 MG PO TABS: 50.0000 mg | ORAL_TABLET | Freq: Every day | ORAL | Status: DC

## 2021-04-10 NOTE — Telephone Encounter (Signed)
Please advise pt that kidney function has returned to normal. Potassium looks good. Please continue current dose of potassium and restart aldactone '50mg'$  once daily (instead of twice daily). Repeat bmet in 1 week, dx hypokalemia.  ?

## 2021-04-10 NOTE — Telephone Encounter (Signed)
Patient advised of all results and instructions from provider. She was scheduled to come in 04-16-21 for BMET ?

## 2021-04-10 NOTE — Addendum Note (Signed)
Addended by: Manuela Schwartz on: 04/10/2021 01:03 PM ? ? Modules accepted: Orders ? ?

## 2021-04-11 ENCOUNTER — Encounter: Payer: Self-pay | Admitting: Family

## 2021-04-11 DIAGNOSIS — R21 Rash and other nonspecific skin eruption: Secondary | ICD-10-CM

## 2021-04-11 LAB — FECAL OCCULT BLOOD, IMMUNOCHEMICAL: Fecal Occult Bld: NEGATIVE

## 2021-04-11 NOTE — Telephone Encounter (Signed)
Appointment moved up one week to 04-23-21. MyChart message sent to patient to confirm this date and time  was ok for her ?

## 2021-04-11 NOTE — Telephone Encounter (Signed)
I reviewed her case with Dr. Kelton Pillar.  She recommended waiting 2 weeks before rechecking her potassium. Let's push this lab appointment out please.  ?

## 2021-04-15 ENCOUNTER — Telehealth: Payer: BC Managed Care – PPO | Admitting: Family

## 2021-04-16 ENCOUNTER — Other Ambulatory Visit: Payer: BC Managed Care – PPO

## 2021-04-23 ENCOUNTER — Other Ambulatory Visit (INDEPENDENT_AMBULATORY_CARE_PROVIDER_SITE_OTHER): Payer: BC Managed Care – PPO

## 2021-04-23 DIAGNOSIS — E876 Hypokalemia: Secondary | ICD-10-CM | POA: Diagnosis not present

## 2021-04-23 LAB — BASIC METABOLIC PANEL
BUN: 22 mg/dL (ref 6–23)
CO2: 28 mEq/L (ref 19–32)
Calcium: 11 mg/dL — ABNORMAL HIGH (ref 8.4–10.5)
Chloride: 94 mEq/L — ABNORMAL LOW (ref 96–112)
Creatinine, Ser: 1.55 mg/dL — ABNORMAL HIGH (ref 0.40–1.20)
GFR: 40.6 mL/min — ABNORMAL LOW (ref >60.00)
Glucose, Bld: 109 mg/dL — ABNORMAL HIGH (ref 70–99)
Potassium: 3.7 mEq/L (ref 3.5–5.1)
Sodium: 137 mEq/L (ref 135–145)

## 2021-05-02 DIAGNOSIS — K6289 Other specified diseases of anus and rectum: Secondary | ICD-10-CM | POA: Diagnosis not present

## 2021-05-02 DIAGNOSIS — E669 Obesity, unspecified: Secondary | ICD-10-CM | POA: Diagnosis not present

## 2021-05-02 DIAGNOSIS — K582 Mixed irritable bowel syndrome: Secondary | ICD-10-CM | POA: Diagnosis not present

## 2021-05-02 DIAGNOSIS — K600 Acute anal fissure: Secondary | ICD-10-CM | POA: Diagnosis not present

## 2021-05-07 ENCOUNTER — Ambulatory Visit: Payer: BC Managed Care – PPO | Admitting: Internal Medicine

## 2021-08-07 ENCOUNTER — Ambulatory Visit (INDEPENDENT_AMBULATORY_CARE_PROVIDER_SITE_OTHER): Payer: BC Managed Care – PPO | Admitting: Family

## 2021-08-07 ENCOUNTER — Encounter: Payer: Self-pay | Admitting: Family

## 2021-08-07 VITALS — BP 121/89 | HR 77 | Temp 98.0°F | Resp 16 | Wt 170.0 lb

## 2021-08-07 DIAGNOSIS — E876 Hypokalemia: Secondary | ICD-10-CM

## 2021-08-07 DIAGNOSIS — R21 Rash and other nonspecific skin eruption: Secondary | ICD-10-CM | POA: Diagnosis not present

## 2021-08-07 LAB — BASIC METABOLIC PANEL
BUN: 23 mg/dL (ref 6–23)
CO2: 34 mEq/L — ABNORMAL HIGH (ref 19–32)
Calcium: 9.6 mg/dL (ref 8.4–10.5)
Chloride: 92 mEq/L — ABNORMAL LOW (ref 96–112)
Creatinine, Ser: 1.15 mg/dL (ref 0.40–1.20)
GFR: 57.97 mL/min — ABNORMAL LOW (ref >60.00)
Glucose, Bld: 128 mg/dL — ABNORMAL HIGH (ref 70–99)
Potassium: 2.9 mEq/L — ABNORMAL LOW (ref 3.5–5.1)
Sodium: 136 mEq/L (ref 135–145)

## 2021-08-07 LAB — MAGNESIUM: Magnesium: 1.7 mg/dL (ref 1.5–2.5)

## 2021-08-07 MED ORDER — BETAMETHASONE DIPROPIONATE 0.05 % EX CREA: TOPICAL_CREAM | Freq: Two times a day (BID) | CUTANEOUS | 1 refills | Status: DC

## 2021-08-07 MED ORDER — POTASSIUM CHLORIDE CRYS ER 20 MEQ PO TBCR: 60.0000 meq | EXTENDED_RELEASE_TABLET | Freq: Three times a day (TID) | ORAL | 6 refills | Status: DC

## 2021-08-07 NOTE — Progress Notes (Signed)
0  Subjective:   By signing my name below, I, [name], attest that this documentation has been prepared under the direction and in the presence of Debbrah Alar, NP. 08/07/2021      Patient ID: Nicole Quinn, female    DOB: 07-09-1975, 46 y.o.   MRN: [id number]  Chief Complaint  Patient presents with   Rash    Patient complains of skin rash on lower extremities      Patient is in today for a office visit.   Rash- She complains of itching rashes on both lower legs since May, 2023. Her symptoms started after she went to the beach. She thought it was a heat rash. Her symptoms are worsening. She is woken up at night due to her rash itching. She has tried aloe vera, benadryl, cortisone cream and found no relief.  Hypokalemia- She has not had a recent follow up with her endocrinologist. She continues kdur 3 tabs tid. She is interested in checking her potassium levels during her next lab work.    Health Maintenance Due  Topic Date Due   COVID-19 Vaccine (3 - Pfizer series) 12/17/2019    Past Medical History:  Diagnosis Date   Allergy    Anemia    Endometriosis    History of echocardiogram    Echo 6/18: EF 60-65, no RWMA, normal diastolic function   History of nuclear stress test    Myoview 6/18: EF 60, normal perfusion, Low Risk   History of UTI    Hypertension    pregnancy induced HTN/pre-eclampsia //  on meds since 2nd child (2000)    Past Surgical History:  Procedure Laterality Date   ABDOMINAL HYSTERECTOMY  2003   partial   BREAST BIOPSY Left 10/01/2017   CARPAL TUNNEL RELEASE     right   GANGLION CYST EXCISION     right   Hammer Toe Repair Bilateral 12/29/2013   Lt #3, #4, Rt #2.    Family History  Problem Relation Age of Onset   Allergies Daughter    Allergic rhinitis Daughter    Allergies Son    Allergic rhinitis Son    Leukemia Paternal Grandmother    Arthritis Mother    Hyperlipidemia Mother    Hypertension Mother    Stroke Mother     Allergic rhinitis Mother    Hypertension Father    Diabetes Father    Arthritis Maternal Grandmother    Stroke Maternal Grandmother 38   Hypertension Maternal Grandmother    Heart attack Maternal Grandmother 82   Coronary artery disease Maternal Aunt 50   Coronary artery disease Maternal Aunt 50   Angioedema Neg Hx    Asthma Neg Hx    Eczema Neg Hx    Immunodeficiency Neg Hx    Urticaria Neg Hx     Social History   Socioeconomic History   Marital status: Married    Spouse name: Not on file   Number of children: 2   Years of education: Not on file   Highest education level: Not on file  Occupational History    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   Married.  Lives with one child.  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     She is in nursing school      Social Determinants of Health   Financial Resource Strain: Not on file  Food Insecurity: Not on file  Transportation Needs: Not on file  Physical Activity: Not on file  Stress: Not on file  Social Connections: Not on file  Intimate Partner Violence: Not on file    Outpatient Medications Prior to Visit  Medication Sig Dispense Refill   Multiple Vitamin (MULTIVITAMIN) tablet Take 1 tablet by mouth daily.     spironolactone (ALDACTONE) 50 MG tablet Take 1 tablet (50 mg total) by mouth daily.     potassium chloride SA (KLOR-CON M) 20 MEQ tablet Take 5 tablets (100 mEq total) by mouth 2 (two) times daily. 360 tablet 6   No facility-administered medications prior to visit.    Allergies  Allergen Reactions   Amoxicillin     rash    Ace Inhibitors     REACTION: lip swelling    Review of Systems  Skin:  Positive for itching and rash (lower legs).       Objective:    Physical Exam Constitutional:      General: She is not in acute distress.    Appearance: Normal appearance. She is not ill-appearing.  HENT:     Head: Normocephalic and atraumatic.     Right Ear: External ear normal.     Left Ear: External ear normal.  Eyes:     Extraocular Movements: Extraocular movements intact.     Pupils: Pupils are equal, round, and reactive to light.  Cardiovascular:     Rate and Rhythm: Normal rate and regular rhythm.     Heart sounds: Normal heart sounds. No murmur heard.    No gallop.  Pulmonary:     Effort: Pulmonary effort is normal. No respiratory distress.     Breath sounds: Normal breath sounds. No wheezing or rales.  Skin:    General: Skin is warm and dry.     Comments: Few scabbed lesions noted bilateral shins  Neurological:     Mental Status: She is alert and oriented to person, place, and time.  Psychiatric:        Judgment: Judgment normal.     BP 121/89 (BP Location: Right Arm, Patient Position: Sitting, Cuff Size: Small)   Pulse 77   Temp 98 F (36.7 C) (Oral)   Resp 16   Wt 170 lb (77.1 kg)   SpO2 100%   BMI 29.18 kg/m  Wt Readings from Last 3 Encounters:  08/07/21 170 lb (77.1 kg)  04/09/21 184 lb (83.5 kg)  03/26/21 192 lb (87.1 kg)       Assessment & Plan:   Problem List Items Addressed This Visit       Unprioritized   Skin rash    New, trial of betamethasone cream bid up to 2 weeks. If no improvement, plan referral to dermatology.       Hypokalemia - Primary    Will recheck potassium and magnesium.       Relevant Orders   Basic metabolic panel   Magnesium     Meds ordered this encounter  Medications   betamethasone dipropionate 0.05 % cream    Sig: Apply topically 2 (two) times daily.    Dispense:  30 g  Refill:  1    Order Specific Question:   Supervising  Provider    Answer:   Penni Homans A [[id number]]   potassium chloride SA (KLOR-CON M) 20 MEQ tablet    Sig: Take 3 tablets (60 mEq total) by mouth 3 (three) times daily.    Dispense:  360 tablet    Refill:  6    Order Specific Question:   Supervising Provider    Answer:   Penni Homans A [[id number]]    I, [name], NP, personally preformed the services described in this documentation.  All medical record entries made by the scribe were at my direction and in my presence.  I have reviewed the chart and discharge instructions (if applicable) and agree that the record reflects my personal performance and is accurate and complete. 08/07/2021   I,[name],acting as a Education administrator for [name], NP.,have documented all relevant documentation on the behalf of [name], NP,as directed by  [name], NP while in the presence of [name], NP.   [name], NP

## 2021-08-07 NOTE — Assessment & Plan Note (Signed)
Will recheck potassium and magnesium.

## 2021-08-07 NOTE — Assessment & Plan Note (Signed)
New, trial of betamethasone cream bid up to 2 weeks. If no improvement, plan referral to dermatology.

## 2021-08-08 ENCOUNTER — Telehealth: Payer: Self-pay | Admitting: Family

## 2021-08-08 DIAGNOSIS — E876 Hypokalemia: Secondary | ICD-10-CM

## 2021-08-08 DIAGNOSIS — R739 Hyperglycemia, unspecified: Secondary | ICD-10-CM

## 2021-08-08 NOTE — Telephone Encounter (Signed)
Please advise pt that her potassium is low.  I would recommend that she take 3 tabs 4 times today, then return to 3 tabs 3 times a day being sure not to miss any doses.  Repeat potassium in 1 week.  I am also going to check an A1C for diabetes as her sugar is a bit elevated.  I would like her to schedule a follow up visit with Dr. Kelton Pillar.

## 2021-08-09 ENCOUNTER — Encounter: Payer: Self-pay | Admitting: Family

## 2021-08-19 ENCOUNTER — Encounter: Payer: Self-pay | Admitting: Family

## 2021-08-26 ENCOUNTER — Encounter: Payer: Self-pay | Admitting: Family

## 2021-08-26 ENCOUNTER — Telehealth: Payer: Self-pay

## 2021-08-26 NOTE — Telephone Encounter (Signed)
Form printed and put if provider's folder to be completed

## 2021-08-26 NOTE — Telephone Encounter (Signed)
Form sent as attachment for tuberculosis statement, printed out and given to provider for signature.

## 2021-08-28 ENCOUNTER — Telehealth: Payer: Self-pay | Admitting: Family

## 2021-08-28 ENCOUNTER — Other Ambulatory Visit: Payer: Self-pay | Admitting: Internal Medicine

## 2021-08-28 NOTE — Telephone Encounter (Signed)
Patient did not schedule 1 week lab follow up. Please call to schedule.

## 2021-08-30 NOTE — Telephone Encounter (Signed)
MyChart message sent to patient to call office for lab appointment early next week.

## 2021-09-03 NOTE — Telephone Encounter (Signed)
Patient called and scheduled for labs 09/06/21

## 2021-09-06 ENCOUNTER — Other Ambulatory Visit (INDEPENDENT_AMBULATORY_CARE_PROVIDER_SITE_OTHER): Payer: BC Managed Care – PPO

## 2021-09-06 DIAGNOSIS — E876 Hypokalemia: Secondary | ICD-10-CM

## 2021-09-06 DIAGNOSIS — R739 Hyperglycemia, unspecified: Secondary | ICD-10-CM | POA: Diagnosis not present

## 2021-09-06 LAB — BASIC METABOLIC PANEL
BUN: 13 mg/dL (ref 6–23)
CO2: 31 mEq/L (ref 19–32)
Calcium: 9.5 mg/dL (ref 8.4–10.5)
Chloride: 100 mEq/L (ref 96–112)
Creatinine, Ser: 0.97 mg/dL (ref 0.40–1.20)
GFR: 71.07 mL/min (ref >60.00)
Glucose, Bld: 73 mg/dL (ref 70–99)
Potassium: 3.3 mEq/L — ABNORMAL LOW (ref 3.5–5.1)
Sodium: 139 mEq/L (ref 135–145)

## 2021-09-06 LAB — HEMOGLOBIN A1C: Hgb A1c MFr Bld: 5.7 % (ref 4.6–6.5)

## 2021-09-06 LAB — MAGNESIUM: Magnesium: 1.9 mg/dL (ref 1.5–2.5)

## 2021-09-16 ENCOUNTER — Other Ambulatory Visit: Payer: Self-pay

## 2021-09-16 DIAGNOSIS — Z111 Encounter for screening for respiratory tuberculosis: Secondary | ICD-10-CM

## 2021-09-20 ENCOUNTER — Encounter (INDEPENDENT_AMBULATORY_CARE_PROVIDER_SITE_OTHER): Payer: BC Managed Care – PPO | Admitting: Family

## 2021-09-20 DIAGNOSIS — H10029 Other mucopurulent conjunctivitis, unspecified eye: Secondary | ICD-10-CM | POA: Diagnosis not present

## 2021-09-20 MED ORDER — CIPROFLOXACIN HCL 0.3 % OP SOLN: 1.0000 [drp] | OPHTHALMIC | 0 refills | Status: DC

## 2021-09-20 NOTE — Telephone Encounter (Signed)
Please see the MyChart message reply(ies) for my assessment and plan.  The patient gave consent for this Medical Advice Message and is aware that it may result in a bill to their insurance company as well as the possibility that this may result in a co-payment or deductible. They are an established patient, but are not seeking medical advice exclusively about a problem treated during an in person or video visit in the last 7 days. I did not recommend an in person or video visit within 7 days of my reply.  I spent a total of 5 minutes cumulative provider time within 7 days through MyChart messaging.  Elishah Ashmore S O'Sullivan, NP  

## 2021-09-23 ENCOUNTER — Telehealth: Payer: BC Managed Care – PPO | Admitting: Nurse Practitioner

## 2021-09-23 DIAGNOSIS — H1031 Unspecified acute conjunctivitis, right eye: Secondary | ICD-10-CM | POA: Diagnosis not present

## 2021-09-23 MED ORDER — POLYMYXIN B-TRIMETHOPRIM 10000-0.1 UNIT/ML-% OP SOLN: OPHTHALMIC | 0 refills | Status: DC

## 2021-09-23 NOTE — Patient Instructions (Addendum)
  Nicole Quinn, thank you for joining Gildardo Pounds, NP for today's virtual visit.  While this provider is not your primary care provider (PCP), if your PCP is located in our provider database this encounter information will be shared with them immediately following your visit.  Consent: (Patient) [name] provided verbal consent for this virtual visit at the beginning of the encounter.  Current Medications:  Current Outpatient Medications:    trimethoprim-polymyxin b (POLYTRIM) ophthalmic solution, Apply 1 drop to affected eye every 4 hours for 7 days, Disp: 10 mL, Rfl: 0   betamethasone dipropionate 0.05 % cream, Apply topically 2 (two) times daily., Disp: 30 g, Rfl: 1   Multiple Vitamin (MULTIVITAMIN) tablet, Take 1 tablet by mouth daily., Disp: , Rfl:    potassium chloride SA (KLOR-CON M) 20 MEQ tablet, Take 3 tablets (60 mEq total) by mouth 3 (three) times daily., Disp: 360 tablet, Rfl: 6   spironolactone (ALDACTONE) 50 MG tablet, Take 1 tablet by mouth twice daily, Disp: 60 tablet, Rfl: 0   Medications ordered in this encounter:  Meds ordered this encounter  Medications   trimethoprim-polymyxin b (POLYTRIM) ophthalmic solution    Sig: Apply 1 drop to affected eye every 4 hours for 7 days    Dispense:  10 mL    Refill:  0    Order Specific Question:   Supervising Provider    Answer:   Noemi Chapel [[id number]]     *If you need refills on other medications prior to your next appointment, please contact your pharmacy*  Follow-Up: Call back or seek an in-person evaluation if the symptoms worsen or if the condition fails to improve as anticipated.  Other Instructions Apply cool compresses to affected eye as instructed for relief of discomfort   If you have been instructed to have an in-person evaluation today at a local Urgent Care facility, please use the link below. It will take you to a list of all of our available [facility], including address, phone number  and hours of operation. Please do not delay care.  [facility]  If you or a family member do not have a primary care provider, use the link below to schedule a visit and establish care. When you choose a [facility] primary care physician or advanced practice provider, you gain a long-term partner in health. Find a Primary Care Provider  Learn more about [facility]'s in-office and virtual care options: [facility]

## 2021-09-23 NOTE — Progress Notes (Signed)
Virtual Visit Consent   Nicole Quinn, you are scheduled for a virtual visit with a [facility] provider today. Just as with appointments in the office, your consent must be obtained to participate. Your consent will be active for this visit and any virtual visit you may have with one of our providers in the next 365 days. If you have a MyChart account, a copy of this consent can be sent to you electronically.  As this is a virtual visit, video technology does not allow for your provider to perform a traditional examination. This may limit your provider's ability to fully assess your condition. If your provider identifies any concerns that need to be evaluated in person or the need to arrange testing (such as labs, EKG, etc.), we will make arrangements to do so. Although advances in technology are sophisticated, we cannot ensure that it will always work on either your end or our end. If the connection with a video visit is poor, the visit may have to be switched to a telephone visit. With either a video or telephone visit, we are not always able to ensure that we have a secure connection.  By engaging in this virtual visit, you consent to the provision of healthcare and authorize for your insurance to be billed (if applicable) for the services provided during this visit. Depending on your insurance coverage, you may receive a charge related to this service.  I need to obtain your verbal consent now. Are you willing to proceed with your visit today? Nicole Quinn has provided verbal consent on 09/23/2021 for a virtual visit (video or telephone). Gildardo Pounds, NP  Date: 09/23/2021 8:20 AM  Virtual Visit via Video Note   I, Gildardo Pounds, connected with  Nicole Quinn  ([id number], Feb 44 17, 1977) on 09/23/21 at  8:15 AM EDT by a video-enabled telemedicine application and verified that I am speaking with the correct person using two identifiers.  Location: Patient: Virtual Visit Location Patient:  Home Provider: Virtual Visit Location Provider: Home Office   I discussed the limitations of evaluation and management by telemedicine and the availability of in person appointments. The patient expressed understanding and agreed to proceed.    History of Present Illness: Nicole Quinn is a 46 y.o. who identifies as a female who was assigned female at birth, and is being seen today for bacterial conjunctivitis.  [name] was prescribed ciloxin eye solution by her PCP on 09-20-2021 for bacterial conjunctivitis. Today she states she has not noticed any improvement in the conjunctivitis over the past few days. She states the eye is redder with increased drainage, matting and irritation. Does not endorse any vision changes.   Problems:  Patient Active Problem List   Diagnosis Date Noted   Renal insufficiency 04/09/2021   Weight gain 02/05/2021   Mastalgia 02/05/2021   Acute renal failure (ARF) ([place]) 10/26/2020   Nonintractable headache 10/26/2020   Transaminitis 10/20/2020   Chronic hypokalemia 10/20/2020   Hypokalemia 10/20/2020   Abdominal pain 10/17/2020   Polyarthralgia 10/17/2020   Hot flashes 10/17/2020   Body aches 09/21/2020   Dysuria 02/13/2018   Arthralgia 02/13/2018   Angioedema 06/26/2016   Seasonal and perennial allergic rhinitis 06/26/2016   Allergic conjunctivitis 06/26/2016   Food allergy 06/26/2016   Allergic urticaria 06/26/2016   Depression 01/29/2016   Left wrist pain 11/29/2015   Obesity (BMI 30.0-34.9) 08/12/2013   Swelling 05/20/2013   Other and unspecified hyperlipidemia 02/24/2013   Chronic constipation 02/22/2013  Routine general medical examination at a health care facility 02/22/2013   Skin rash 01/13/2012   Anxiety associated with depression 10/10/2011   Dyspnea 08/24/2011   OTHER ACNE 06/04/2009   HYPERGLYCEMIA, BORDERLINE 06/04/2009   Essential hypertension 04/10/2009    Allergies:  Allergies  Allergen Reactions   Amoxicillin     rash    Ace Inhibitors     REACTION: lip swelling   Medications:  Current Outpatient Medications:    trimethoprim-polymyxin b (POLYTRIM) ophthalmic solution, Apply 1 drop to affected eye every 4 hours for 7 days, Disp: 10 mL, Rfl: 0   betamethasone dipropionate 0.05 % cream, Apply topically 2 (two) times daily., Disp: 30 g, Rfl: 1   Multiple Vitamin (MULTIVITAMIN) tablet, Take 1 tablet by mouth daily., Disp: , Rfl:    potassium chloride SA (KLOR-CON M) 20 MEQ tablet, Take 3 tablets (60 mEq total) by mouth 3 (three) times daily., Disp: 360 tablet, Rfl: 6   spironolactone (ALDACTONE) 50 MG tablet, Take 1 tablet by mouth twice daily, Disp: 60 tablet, Rfl: 0  Observations/Objective: Patient is well-developed, well-nourished in no acute distress.  Currently at work.  Head is normocephalic, atraumatic.  No labored breathing.  Speech is clear and coherent with logical content.  Patient is alert and oriented at baseline.    Assessment and Plan: 1. Acute bacterial conjunctivitis of right eye - trimethoprim-polymyxin b (POLYTRIM) ophthalmic solution; Apply 1 drop to affected eye every 4 hours for 7 days  Dispense: 10 mL; Refill: 0  Apply cool compresses to affected eye as instructed for relief of discomfort Follow up with PCP or eye doctor if no improvement or worsening of symptoms.   Follow Up Instructions: I discussed the assessment and treatment plan with the patient. The patient was provided an opportunity to ask questions and all were answered. The patient agreed with the plan and demonstrated an understanding of the instructions.  A copy of instructions were sent to the patient via MyChart unless otherwise noted below.    The patient was advised to call back or seek an in-person evaluation if the symptoms worsen or if the condition fails to improve as anticipated.  Time:  I spent 11 minutes with the patient via telehealth technology discussing the above problems/concerns.    Gildardo Pounds,  NP

## 2021-09-25 ENCOUNTER — Other Ambulatory Visit: Payer: Self-pay

## 2021-09-25 DIAGNOSIS — B179 Acute viral hepatitis, unspecified: Secondary | ICD-10-CM

## 2021-09-25 DIAGNOSIS — R7401 Elevation of levels of liver transaminase levels: Secondary | ICD-10-CM

## 2021-09-25 NOTE — Telephone Encounter (Signed)
I spoke to patient and advised her to contact her eye doctor and request a visit today for further evaluation. Pt verbalizes understanding.

## 2021-09-25 NOTE — Addendum Note (Signed)
Addended by: Debbrah Alar on: 09/25/2021 10:09 AM   Modules accepted: Orders

## 2021-09-26 ENCOUNTER — Other Ambulatory Visit (INDEPENDENT_AMBULATORY_CARE_PROVIDER_SITE_OTHER): Payer: BC Managed Care – PPO

## 2021-09-26 DIAGNOSIS — Z111 Encounter for screening for respiratory tuberculosis: Secondary | ICD-10-CM | POA: Diagnosis not present

## 2021-09-28 LAB — QUANTIFERON-TB GOLD PLUS
Mitogen-NIL: >10 IU/mL
NIL: 0.05 IU/mL
QuantiFERON-TB Gold Plus: NEGATIVE
TB1-NIL: <0 IU/mL
TB2-NIL: <0 IU/mL

## 2021-09-29 ENCOUNTER — Other Ambulatory Visit: Payer: Self-pay | Admitting: Family

## 2021-10-30 ENCOUNTER — Other Ambulatory Visit: Payer: Self-pay | Admitting: Family

## 2021-12-10 ENCOUNTER — Other Ambulatory Visit: Payer: Self-pay

## 2021-12-10 DIAGNOSIS — E876 Hypokalemia: Secondary | ICD-10-CM

## 2021-12-16 ENCOUNTER — Other Ambulatory Visit (INDEPENDENT_AMBULATORY_CARE_PROVIDER_SITE_OTHER): Payer: BC Managed Care – PPO

## 2021-12-16 DIAGNOSIS — R7401 Elevation of levels of liver transaminase levels: Secondary | ICD-10-CM | POA: Diagnosis not present

## 2021-12-16 DIAGNOSIS — E876 Hypokalemia: Secondary | ICD-10-CM | POA: Diagnosis not present

## 2021-12-16 DIAGNOSIS — B179 Acute viral hepatitis, unspecified: Secondary | ICD-10-CM

## 2021-12-16 LAB — HEPATIC FUNCTION PANEL
ALT: 19 U/L (ref 0–35)
AST: 19 U/L (ref 0–37)
Albumin: 4.7 g/dL (ref 3.5–5.2)
Alkaline Phosphatase: 89 U/L (ref 39–117)
Bilirubin, Direct: 0.1 mg/dL (ref 0.0–0.3)
Total Bilirubin: 0.3 mg/dL (ref 0.2–1.2)
Total Protein: 7.9 g/dL (ref 6.0–8.3)

## 2021-12-16 LAB — BASIC METABOLIC PANEL
BUN: 23 mg/dL (ref 6–23)
CO2: 34 mEq/L — ABNORMAL HIGH (ref 19–32)
Calcium: 9.9 mg/dL (ref 8.4–10.5)
Chloride: 92 mEq/L — ABNORMAL LOW (ref 96–112)
Creatinine, Ser: 1.33 mg/dL — ABNORMAL HIGH (ref 0.40–1.20)
GFR: 48.57 mL/min — ABNORMAL LOW (ref >60.00)
Glucose, Bld: 90 mg/dL (ref 70–99)
Potassium: 3.4 mEq/L — ABNORMAL LOW (ref 3.5–5.1)
Sodium: 135 mEq/L (ref 135–145)

## 2021-12-19 ENCOUNTER — Ambulatory Visit (INDEPENDENT_AMBULATORY_CARE_PROVIDER_SITE_OTHER): Payer: BC Managed Care – PPO

## 2021-12-19 DIAGNOSIS — Z23 Encounter for immunization: Secondary | ICD-10-CM | POA: Diagnosis not present

## 2021-12-19 LAB — ANA: Anti Nuclear Antibody (ANA): NEGATIVE

## 2021-12-19 NOTE — Progress Notes (Signed)
Pt here for Flu Shot   Pt tolerated well left deltoid

## 2021-12-20 ENCOUNTER — Encounter: Payer: Self-pay | Admitting: Family

## 2021-12-20 ENCOUNTER — Ambulatory Visit: Payer: BC Managed Care – PPO

## 2022-01-11 ENCOUNTER — Encounter: Payer: Self-pay | Admitting: Family

## 2022-01-19 ENCOUNTER — Other Ambulatory Visit: Payer: Self-pay | Admitting: Family

## 2022-01-21 ENCOUNTER — Other Ambulatory Visit: Payer: Self-pay | Admitting: Family

## 2022-01-21 DIAGNOSIS — Z1231 Encounter for screening mammogram for malignant neoplasm of breast: Secondary | ICD-10-CM

## 2022-01-24 ENCOUNTER — Ambulatory Visit
Admission: RE | Admit: 2022-01-24 | Discharge: 2022-01-24 | Disposition: A | Discharge status: Home / Self Care | Payer: BC Managed Care – PPO | Source: Ambulatory Visit | Attending: Family | Admitting: Family

## 2022-01-24 DIAGNOSIS — Z1231 Encounter for screening mammogram for malignant neoplasm of breast: Secondary | ICD-10-CM

## 2022-01-26 ENCOUNTER — Other Ambulatory Visit: Payer: Self-pay | Admitting: Internal Medicine

## 2022-01-29 ENCOUNTER — Encounter: Payer: Self-pay | Admitting: Family

## 2022-01-30 ENCOUNTER — Other Ambulatory Visit: Payer: Self-pay

## 2022-01-30 MED ORDER — POTASSIUM CHLORIDE CRYS ER 20 MEQ PO TBCR: 60.0000 meq | EXTENDED_RELEASE_TABLET | Freq: Three times a day (TID) | ORAL | 6 refills | Status: DC

## 2022-02-03 ENCOUNTER — Other Ambulatory Visit: Payer: Self-pay | Admitting: Internal Medicine

## 2022-02-05 ENCOUNTER — Ambulatory Visit (INDEPENDENT_AMBULATORY_CARE_PROVIDER_SITE_OTHER): Payer: BC Managed Care – PPO

## 2022-02-05 ENCOUNTER — Ambulatory Visit: Payer: BC Managed Care – PPO | Admitting: Podiatry

## 2022-02-05 DIAGNOSIS — M2041 Other hammer toe(s) (acquired), right foot: Secondary | ICD-10-CM

## 2022-02-05 DIAGNOSIS — M2042 Other hammer toe(s) (acquired), left foot: Secondary | ICD-10-CM

## 2022-02-05 NOTE — Progress Notes (Signed)
Subjective:   Patient ID: Nicole Quinn, female   DOB: 47 y.o.   MRN: [id number]   HPI Patient had a history of surgery a number of years ago approximate 9 years and has developed discomfort in the second toe left foot that is hard to wear shoe gear with and also has discomfort around the first metatarsal head left stating she has tried wider shoes she has tried cushioning and other modalities without relief of symptoms.  Patient other issues are doing well does not smoke likes to be active   Review of Systems  All other systems reviewed and are negative.       Objective:  Physical Exam  Neurovascular status intact muscle strength found to be adequate range of motion adequate.  Patient is noted to have structural bunion deformity left with prominence around the first metatarsal head and moderate hallux limitus with spur formation and elevated second digit left that is painful when pressed.  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  She wants to get this done but needs to wait till April and I recommended a biplanar osteotomy first metatarsal left digital fusion second digit left.  Reviewed this with her applied cushioning to the toe to take pressure off it and reappoint to recheck  X-rays indicate there is structural deformity with bone spur formation first metatarsal head and moderate elevation of the ankle

## 2022-02-06 ENCOUNTER — Encounter: Payer: Self-pay | Admitting: Family

## 2022-02-09 ENCOUNTER — Other Ambulatory Visit: Payer: Self-pay | Admitting: Family

## 2022-02-17 ENCOUNTER — Encounter: Payer: Self-pay | Admitting: Family

## 2022-02-18 ENCOUNTER — Ambulatory Visit (INDEPENDENT_AMBULATORY_CARE_PROVIDER_SITE_OTHER): Payer: BC Managed Care – PPO | Admitting: Family

## 2022-02-18 ENCOUNTER — Other Ambulatory Visit (HOSPITAL_COMMUNITY)
Admission: RE | Admit: 2022-02-18 | Discharge: 2022-02-18 | Disposition: A | Discharge status: Home / Self Care | Payer: BC Managed Care – PPO | Source: Ambulatory Visit | Attending: Family | Admitting: Family

## 2022-02-18 VITALS — BP 124/91 | HR 84 | Temp 98.0°F | Resp 16 | Wt 182.0 lb

## 2022-02-18 DIAGNOSIS — R319 Hematuria, unspecified: Secondary | ICD-10-CM

## 2022-02-18 DIAGNOSIS — E876 Hypokalemia: Secondary | ICD-10-CM

## 2022-02-18 DIAGNOSIS — N898 Other specified noninflammatory disorders of vagina: Secondary | ICD-10-CM | POA: Insufficient documentation

## 2022-02-18 DIAGNOSIS — R58 Hemorrhage, not elsewhere classified: Secondary | ICD-10-CM | POA: Diagnosis not present

## 2022-02-18 DIAGNOSIS — I10 Essential (primary) hypertension: Secondary | ICD-10-CM

## 2022-02-18 DIAGNOSIS — N289 Disorder of kidney and ureter, unspecified: Secondary | ICD-10-CM

## 2022-02-18 DIAGNOSIS — N1831 Chronic kidney disease, stage 3a: Secondary | ICD-10-CM

## 2022-02-18 LAB — POC URINALSYSI DIPSTICK (AUTOMATED)
Bilirubin, UA: NEGATIVE
Blood, UA: NEGATIVE
Glucose, UA: NEGATIVE
Ketones, UA: NEGATIVE
Nitrite, UA: NEGATIVE
Protein, UA: NEGATIVE
Spec Grav, UA: <=1.005 — AB (ref 1.010–1.025)
Urobilinogen, UA: 0.2 E.U./dL
pH, UA: 8 (ref 5.0–8.0)

## 2022-02-18 MED ORDER — AMLODIPINE BESYLATE 2.5 MG PO TABS: 2.5000 mg | ORAL_TABLET | Freq: Every day | ORAL | 0 refills | Status: DC

## 2022-02-18 NOTE — Progress Notes (Addendum)
Subjective:     Patient ID: Nicole Quinn, female    DOB: [birth year], 47 y.o.   MRN: [id number]  Chief Complaint  Patient presents with   Hematuria    Patient reports seeing blood in tissue after urinating    HPI Patient is in today to discuss vaginal bleeding.  No dysuria. Denies fever or low back pain.   Mild discharge.  She has her ovaries, uterus has been removed.   BP Readings from Last 3 Encounters:  03/18/22 136/81  02/18/22 (!) 124/91  08/07/21 121/89    Health Maintenance Due  Topic Date Due   COVID-19 Vaccine (3 - 2023-24 season) 09/20/2021   COLONOSCOPY (Pts 45-1yr Insurance coverage will need to be confirmed)  Never done    Past Medical History:  Diagnosis Date   Allergy    Anemia    Endometriosis    History of echocardiogram    Echo 6/18: EF 60-65, no RWMA, normal diastolic function   History of nuclear stress test    Myoview 6/18: EF 60, normal perfusion, Low Risk   History of UTI    Hypertension    pregnancy induced HTN/pre-eclampsia //  on meds since 2nd child (2000)    Past Surgical History:  Procedure Laterality Date   ABDOMINAL HYSTERECTOMY  2003   partial   BREAST BIOPSY Left 10/01/2017   BREAST BIOPSY Right    CARPAL TUNNEL RELEASE     right   GANGLION CYST EXCISION     right   Hammer Toe Repair Bilateral 12/29/2013   Lt #3, #4, Rt #2.    Family History  Problem Relation Age of Onset   Allergies Daughter    Allergic rhinitis Daughter    Allergies Son    Allergic rhinitis Son    Leukemia Paternal Grandmother    Arthritis Mother    Hyperlipidemia Mother    Hypertension Mother    Stroke Mother    Allergic rhinitis Mother    Hypertension Father    Diabetes Father    Arthritis Maternal Grandmother    Stroke Maternal Grandmother 586  Hypertension Maternal Grandmother    Heart attack Maternal Grandmother 557  Coronary artery disease Maternal Aunt 50   Coronary artery disease Maternal Aunt 50   Angioedema Neg Hx    Asthma  Neg Hx    Eczema Neg Hx    Immunodeficiency Neg Hx    Urticaria Neg Hx     Social History   Socioeconomic History   Marital status: Married    Spouse name: Not on file   Number of children: 2   Years of education: Not on file   Highest education level: Not on file  Occupational History    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   Married.  Lives with one child.  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     potassium chloride SA (KLOR-CON M) 20 MEQ tablet Take 3 tablets (60 mEq total) by mouth 3 (three) times daily. 360 tablet 6   spironolactone (ALDACTONE) 50 MG tablet Take 1 tablet by mouth twice daily 180 tablet 0   trimethoprim-polymyxin b (POLYTRIM) ophthalmic solution Apply 1 drop to affected eye every 4 hours for 7 days 10 mL 0   No facility-administered medications prior to visit.    Allergies  Allergen Reactions   Amoxicillin     rash   Ace Inhibitors     REACTION: lip swelling    ROS See HPI    Objective:    Physical Exam Exam conducted with a  chaperone present.  Constitutional:      General: She is not in acute distress.    Appearance: Normal appearance. She is well-developed.  HENT:     Head: Normocephalic and atraumatic.     Right Ear: External ear normal.     Left Ear: External ear normal.  Eyes:     General: No scleral icterus. Neck:     Thyroid: No thyromegaly.  Cardiovascular:     Rate and Rhythm: Normal rate and regular rhythm.     Heart sounds: Normal heart sounds. No murmur heard. Pulmonary:     Effort: Pulmonary effort is normal. No respiratory distress.     Breath sounds: Normal breath sounds. No wheezing.  Genitourinary:    General: Normal vulva.     Labia:        Left: No rash.      Vagina: Normal. No bleeding.     Uterus: Absent.      Adnexa: Right adnexa normal and left adnexa normal.       Right: No mass or tenderness.         Left: No mass or tenderness.       Comments: Normal speculum exam, no obvious bleeding, normal adnexa, s/p hysterectomy Musculoskeletal:     Cervical back: Neck supple.  Skin:    General: Skin is warm and dry.  Neurological:     Mental Status: She is alert and oriented to person, place, and time.  Psychiatric:        Mood and Affect: Mood normal.        Behavior: Behavior normal.        Thought Content: Thought content normal.        Judgment: Judgment normal.     BP (!) 124/91   Pulse 84   Temp 98 F (36.7 C) (Oral)   Resp 16   Wt 182 lb (82.6 kg)   SpO2 100%   BMI 31.24 kg/m  Wt Readings from Last 3 Encounters:  03/18/22 182 lb (82.6 kg)  02/18/22 182 lb (82.6 kg)  08/07/21 170 lb (77.1 kg)       Assessment & Plan:   Problem List Items Addressed This Visit       Unprioritized   Renal insufficiency    GFR has decreased since last check.  Recommended that she follow back up with  nephrology.  Referral placed.       Hypokalemia    Continues kdur, follow up K+ is stable.  Continue Kdur.       Relevant Orders   Basic Metabolic Panel (BMET)  (Completed)   Essential hypertension    BP above goal. Will restart amlodipine at 2.'5mg'$  once daily which she has taken in the past.       Relevant Medications   amLODipine (NORVASC) 2.5 MG tablet   Bleeding    Resolved. No obvious vaginal source of bleeding. UA negative for blood.  Will obtain IFOB to exclude rectal source.  Pt is advised to call if recurrent bleeding occurs.        Relevant Orders   Urine Culture (Completed)   Fecal occult blood, imunochemical([facility]) (Completed)   Other Visit Diagnoses     Hematuria, unspecified type    -  Primary   Relevant Orders   POCT Urinalysis Dipstick (Automated) (Completed)   Vaginal discharge       Relevant Orders   Cervicovaginal ancillary only( [facility]) (Completed)   Stage 3a chronic kidney disease ([place])           I have discontinued Zailey M. Hosea's trimethoprim-polymyxin b. I am also having her start on amLODipine. Additionally, I am having her maintain her multivitamin, betamethasone dipropionate, potassium chloride SA, and spironolactone.  Meds ordered this encounter  Medications   amLODipine (NORVASC) 2.5 MG tablet    Sig: Take 1 tablet (2.5 mg total) by mouth daily.    Dispense:  90 tablet    Refill:  0    Order Specific Question:   Supervising Provider    Answer:   Penni Homans A [[id number]]

## 2022-02-19 ENCOUNTER — Telehealth: Payer: Self-pay | Admitting: Family

## 2022-02-19 DIAGNOSIS — N1831 Chronic kidney disease, stage 3a: Secondary | ICD-10-CM | POA: Insufficient documentation

## 2022-02-19 LAB — BASIC METABOLIC PANEL
BUN: 24 mg/dL — ABNORMAL HIGH (ref 6–23)
CO2: 29 mEq/L (ref 19–32)
Calcium: 9.7 mg/dL (ref 8.4–10.5)
Chloride: 95 mEq/L — ABNORMAL LOW (ref 96–112)
Creatinine, Ser: 1.4 mg/dL — ABNORMAL HIGH (ref 0.40–1.20)
GFR: 45.61 mL/min — ABNORMAL LOW (ref >60.00)
Glucose, Bld: 85 mg/dL (ref 70–99)
Potassium: 4.3 mEq/L (ref 3.5–5.1)
Sodium: 134 mEq/L — ABNORMAL LOW (ref 135–145)

## 2022-02-19 LAB — URINE CULTURE
MICRO NUMBER:: 14493118
Result:: NO GROWTH
SPECIMEN QUALITY:: ADEQUATE

## 2022-02-19 NOTE — Telephone Encounter (Signed)
Please advise pt that her kidney function remains mildly reduced.  Possibly due to her history of high blood pressure.  I would recommend that she see nephrology again and I am placing referral.   Stay well hydrated and avoid NSAIDS to protect kidneys.

## 2022-02-20 DIAGNOSIS — R58 Hemorrhage, not elsewhere classified: Secondary | ICD-10-CM | POA: Insufficient documentation

## 2022-02-20 LAB — CERVICOVAGINAL ANCILLARY ONLY
Bacterial Vaginitis (gardnerella): NEGATIVE
Candida Glabrata: NEGATIVE
Candida Vaginitis: NEGATIVE
Chlamydia: NEGATIVE
Comment: NEGATIVE
Comment: NEGATIVE
Comment: NEGATIVE
Comment: NEGATIVE
Comment: NEGATIVE
Comment: NORMAL
Neisseria Gonorrhea: NEGATIVE
Trichomonas: NEGATIVE

## 2022-02-20 NOTE — Telephone Encounter (Signed)
Called but no answer lvm

## 2022-02-20 NOTE — Telephone Encounter (Signed)
Patient advised of provider's comments.  

## 2022-02-20 NOTE — Assessment & Plan Note (Signed)
BP above goal. Will restart amlodipine at 2.'5mg'$  once daily which she has taken in the past.

## 2022-02-20 NOTE — Assessment & Plan Note (Signed)
Continues kdur, follow up K+ is stable.  Continue Kdur.

## 2022-02-20 NOTE — Assessment & Plan Note (Signed)
GFR has decreased since last check.  Recommended that she follow back up with nephrology.  Referral placed.

## 2022-02-20 NOTE — Assessment & Plan Note (Signed)
Resolved. No obvious vaginal source of bleeding. UA negative for blood.  Will obtain IFOB to exclude rectal source.  Pt is advised to call if recurrent bleeding occurs.

## 2022-02-27 ENCOUNTER — Encounter: Payer: Self-pay | Admitting: Family

## 2022-02-27 DIAGNOSIS — H00024 Hordeolum internum left upper eyelid: Secondary | ICD-10-CM | POA: Diagnosis not present

## 2022-02-27 DIAGNOSIS — R519 Headache, unspecified: Secondary | ICD-10-CM | POA: Diagnosis not present

## 2022-03-07 ENCOUNTER — Other Ambulatory Visit (INDEPENDENT_AMBULATORY_CARE_PROVIDER_SITE_OTHER): Payer: BC Managed Care – PPO

## 2022-03-07 DIAGNOSIS — R58 Hemorrhage, not elsewhere classified: Secondary | ICD-10-CM

## 2022-03-07 NOTE — Addendum Note (Signed)
Addended by: Manuela Schwartz on: 03/07/2022 03:27 PM   Modules accepted: Orders

## 2022-03-11 LAB — FECAL OCCULT BLOOD, IMMUNOCHEMICAL: Fecal Occult Bld: POSITIVE — AB

## 2022-03-17 NOTE — Progress Notes (Signed)
Subjective:   By signing my name below, I, Nicole Quinn, attest that this documentation has been prepared under the direction and in the presence of Nicole Pear, NP 03/19/22   Patient ID: Nicole Quinn, female    DOB: 1975/08/30, 47 y.o.   MRN: [id number]  No chief complaint on file.   HPI Patient is in today for a 4 week follow up.   Blood pressure: She has been compliant with Amlodipine 2.5 mg daily and is tolerating it. She also endorses swelling.  BP Readings from Last 3 Encounters:  03/18/22 136/81  02/18/22 (!) 124/91  08/07/21 121/89   Vaginal bleeding: She reports she noticed some spotting while wiping. She does not believe it was rectal.   Stye on left eye: She has had a stye on her left eye for which she recently went to urgent care. She endorses pain, soreness, and redness. She states she was prescribed Tobramycin, which did not seem to help. She has also tried the warm compress.   Past Medical History:  Diagnosis Date   Allergy    Anemia    Endometriosis    History of echocardiogram    Echo 6/18: EF 60-65, no RWMA, normal diastolic function   History of nuclear stress test    Myoview 6/18: EF 60, normal perfusion, Low Risk   History of UTI    Hypertension    pregnancy induced HTN/pre-eclampsia //  on meds since 2nd child (2000)    Past Surgical History:  Procedure Laterality Date   ABDOMINAL HYSTERECTOMY  2003   partial   BREAST BIOPSY Left 10/01/2017   BREAST BIOPSY Right    CARPAL TUNNEL RELEASE     right   GANGLION CYST EXCISION     right   Hammer Toe Repair Bilateral 12/29/2013   Lt #3, #4, Rt #2.    Family History  Problem Relation Age of Onset   Allergies Daughter    Allergic rhinitis Daughter    Allergies Son    Allergic rhinitis Son    Leukemia Paternal Grandmother    Arthritis Mother    Hyperlipidemia Mother    Hypertension Mother    Stroke Mother    Allergic rhinitis Mother    Hypertension Father    Diabetes Father     Arthritis Maternal Grandmother    Stroke Maternal Grandmother 35   Hypertension Maternal Grandmother    Heart attack Maternal Grandmother 4   Coronary artery disease Maternal Aunt 50   Coronary artery disease Maternal Aunt 50   Angioedema Neg Hx    Asthma Neg Hx    Eczema Neg Hx    Immunodeficiency Neg Hx    Urticaria Neg Hx     Social History   Socioeconomic History   Marital status: Married    Spouse name: Not on file   Number of children: 2   Years of education: Not on file   Highest education level: Not on file  Occupational History    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  Married.  Lives with one child.  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 90 tablet 0   betamethasone dipropionate 0.05 % cream APPLY  CREAM TOPICALLY TWICE DAILY 30 g 0   Multiple Vitamin (MULTIVITAMIN) tablet Take 1 tablet by mouth daily.     potassium chloride SA (KLOR-CON M) 20 MEQ tablet Take 3 tablets (60 mEq total) by mouth 3 (three) times daily. 360 tablet 6   spironolactone (ALDACTONE) 50 MG tablet Take 1 tablet by mouth twice daily 180 tablet 0   No  facility-administered medications prior to visit.    Allergies  Allergen Reactions   Amoxicillin     rash   Ace Inhibitors     REACTION: lip swelling    Review of Systems  Eyes:        Stye on left eye  Genitourinary:        Vaginal spotting       Objective:    Physical Exam Constitutional:      General: She is not in acute distress.    Appearance: Normal appearance. She is well-developed.  HENT:     Head: Normocephalic and atraumatic.     Right Ear: External ear normal.     Left Ear: External ear normal.  Eyes:     General: No scleral icterus.    Comments: Stye on left upper eyelid  Neck:     Thyroid: No thyromegaly.  Cardiovascular:     Rate and Rhythm: Normal rate and regular rhythm.     Heart sounds: Normal heart sounds. No murmur heard. Pulmonary:     Effort: Pulmonary effort is normal. No respiratory distress.     Breath sounds: Normal breath sounds. No wheezing.  Musculoskeletal:     Cervical back: Neck supple.  Skin:    General: Skin is warm and dry.  Neurological:     Mental Status: She is alert and oriented to person, place, and time.  Psychiatric:        Mood and Affect: Mood normal.        Behavior: Behavior normal.        Thought Content: Thought content normal.        Judgment: Judgment normal.     BP 136/81 (BP Location: Right Arm, Patient Position: Sitting, Cuff Size: Small)   Pulse 84   Temp 97.7 F (36.5 C) (Oral)   Resp 16   Wt 182 lb (82.6 kg)   SpO2 100%   BMI 31.24 kg/m  Wt Readings from Last 3 Encounters:  03/18/22 182 lb (82.6 kg)  02/18/22 182 lb (82.6 kg)  08/07/21 170 lb (77.1 kg)       Assessment & Plan:  Hordeolum externum of left upper eyelid Assessment & Plan: Pt reports that it is improving.  Continue warm compresses. Let me know if symptoms do not improve.    Heme positive stool Assessment & Plan: New.   Lab Results  Component Value Date  WBC 7.1 03/26/2021   HGB 10.9 (L) 03/26/2021   HCT 32.4 (L)  03/26/2021   MCV 91.8 03/26/2021   PLT 343.0 03/26/2021  New. Will refer to GI for further evaluation. Will ask pt to return for cbc and iron studies.   She is s/p hysterectomy, I am not sure that her "vaginal bleeding" is actually vaginal. Normal pelvic exam last visit. UA was negative for blood 1 month ago.   Orders: -     Ambulatory referral to Gastroenterology  Essential hypertension Assessment & Plan: BP is improved on amlodipine 2.'5mg'$ .  Continue same.       I,Rachel Rivera,acting as a Education administrator for Nicole Pear, NP.,have documented all relevant documentation on the behalf of Nicole Pear, NP,as directed by  Nicole Pear, NP while in the presence of Nicole Pear, NP.   I, Nicole Pear, NP, personally preformed the services described in this documentation.  All medical record entries made by the scribe were at my direction and in my presence.  I have reviewed the chart and discharge instructions (if applicable) and agree that the record reflects my personal performance and is accurate and complete. 03/19/22   Nicole Pear, NP

## 2022-03-18 ENCOUNTER — Ambulatory Visit (INDEPENDENT_AMBULATORY_CARE_PROVIDER_SITE_OTHER): Payer: BC Managed Care – PPO | Admitting: Family

## 2022-03-18 VITALS — BP 136/81 | HR 84 | Temp 97.7°F | Resp 16 | Wt 182.0 lb

## 2022-03-18 DIAGNOSIS — R195 Other fecal abnormalities: Secondary | ICD-10-CM | POA: Diagnosis not present

## 2022-03-18 DIAGNOSIS — I10 Essential (primary) hypertension: Secondary | ICD-10-CM

## 2022-03-18 DIAGNOSIS — H00014 Hordeolum externum left upper eyelid: Secondary | ICD-10-CM

## 2022-03-19 ENCOUNTER — Telehealth: Payer: Self-pay | Admitting: Family

## 2022-03-19 DIAGNOSIS — R195 Other fecal abnormalities: Secondary | ICD-10-CM | POA: Insufficient documentation

## 2022-03-19 DIAGNOSIS — H00014 Hordeolum externum left upper eyelid: Secondary | ICD-10-CM | POA: Insufficient documentation

## 2022-03-19 NOTE — Assessment & Plan Note (Signed)
Pt reports that it is improving.  Continue warm compresses. Let me know if symptoms do not improve.

## 2022-03-19 NOTE — Assessment & Plan Note (Signed)
BP is improved on amlodipine 2.'5mg'$ .  Continue same.

## 2022-03-19 NOTE — Telephone Encounter (Signed)
Please advise pt that I was reviewing her chart.  Since her stool has hidden blood in it, I would recommend that she see gastroenterology for consultation.  She is due for colonoscopy this year anyway.  Let me know if they have not called her back in 1 week.  I also realized that we haven't updated her blood count in a long time. Given the finding of blood in the stool, we should repeat iron studies and blood count when she can return for a lab visit please.

## 2022-03-19 NOTE — Assessment & Plan Note (Addendum)
New.   Lab Results  Component Value Date   WBC 7.1 03/26/2021   HGB 10.9 (L) 03/26/2021   HCT 32.4 (L) 03/26/2021   MCV 91.8 03/26/2021   PLT 343.0 03/26/2021  New. Will refer to GI for further evaluation. Will ask pt to return for cbc and iron studies.   She is s/p hysterectomy, I am not sure that her "vaginal bleeding" is actually vaginal. Normal pelvic exam last visit. UA was negative for blood 1 month ago.

## 2022-03-20 NOTE — Telephone Encounter (Signed)
Called patient but no answer, left voice mail for patinet to call back.

## 2022-03-21 ENCOUNTER — Encounter: Payer: Self-pay | Admitting: General Practice

## 2022-03-21 NOTE — Telephone Encounter (Signed)
Mychart message sent with this information

## 2022-03-25 DIAGNOSIS — K5904 Chronic idiopathic constipation: Secondary | ICD-10-CM | POA: Diagnosis not present

## 2022-03-25 DIAGNOSIS — K6289 Other specified diseases of anus and rectum: Secondary | ICD-10-CM | POA: Diagnosis not present

## 2022-03-25 DIAGNOSIS — K600 Acute anal fissure: Secondary | ICD-10-CM | POA: Diagnosis not present

## 2022-03-25 DIAGNOSIS — K625 Hemorrhage of anus and rectum: Secondary | ICD-10-CM | POA: Diagnosis not present

## 2022-03-25 MED ORDER — METOPROLOL SUCCINATE ER 25 MG PO TB24: 25.0000 mg | ORAL_TABLET | Freq: Every day | ORAL | 0 refills | Status: DC

## 2022-03-25 NOTE — Addendum Note (Signed)
Addended by: Debbrah Alar on: 03/25/2022 11:03 AM   Modules accepted: Orders

## 2022-03-27 DIAGNOSIS — H00024 Hordeolum internum left upper eyelid: Secondary | ICD-10-CM | POA: Diagnosis not present

## 2022-03-27 DIAGNOSIS — B300 Keratoconjunctivitis due to adenovirus: Secondary | ICD-10-CM | POA: Diagnosis not present

## 2022-03-28 ENCOUNTER — Encounter: Payer: Self-pay | Admitting: Family

## 2022-03-28 ENCOUNTER — Ambulatory Visit (INDEPENDENT_AMBULATORY_CARE_PROVIDER_SITE_OTHER): Payer: BC Managed Care – PPO | Admitting: Family

## 2022-03-28 VITALS — BP 118/82 | HR 83 | Temp 98.7°F | Resp 16 | Ht 64.0 in | Wt 178.0 lb

## 2022-03-28 DIAGNOSIS — E785 Hyperlipidemia, unspecified: Secondary | ICD-10-CM | POA: Diagnosis not present

## 2022-03-28 DIAGNOSIS — Z0000 Encounter for general adult medical examination without abnormal findings: Secondary | ICD-10-CM

## 2022-03-28 DIAGNOSIS — N1831 Chronic kidney disease, stage 3a: Secondary | ICD-10-CM | POA: Diagnosis not present

## 2022-03-28 LAB — LIPID PANEL
Cholesterol: 184 mg/dL (ref 0–200)
HDL: 40.1 mg/dL (ref >39.00)
LDL Cholesterol: 130 mg/dL — ABNORMAL HIGH (ref 0–99)
NonHDL: 143.99
Total CHOL/HDL Ratio: 5
Triglycerides: 69 mg/dL (ref 0.0–149.0)
VLDL: 13.8 mg/dL (ref 0.0–40.0)

## 2022-03-28 LAB — COMPREHENSIVE METABOLIC PANEL
ALT: 17 U/L (ref 0–35)
AST: 16 U/L (ref 0–37)
Albumin: 3.8 g/dL (ref 3.5–5.2)
Alkaline Phosphatase: 86 U/L (ref 39–117)
BUN: 16 mg/dL (ref 6–23)
CO2: 28 mEq/L (ref 19–32)
Calcium: 9 mg/dL (ref 8.4–10.5)
Chloride: 105 mEq/L (ref 96–112)
Creatinine, Ser: 1.1 mg/dL (ref 0.40–1.20)
GFR: 60.88 mL/min (ref >60.00)
Glucose, Bld: 80 mg/dL (ref 70–99)
Potassium: 3 mEq/L — ABNORMAL LOW (ref 3.5–5.1)
Sodium: 142 mEq/L (ref 135–145)
Total Bilirubin: 0.4 mg/dL (ref 0.2–1.2)
Total Protein: 6.6 g/dL (ref 6.0–8.3)

## 2022-03-28 NOTE — Assessment & Plan Note (Signed)
Discussed healthy diet, exercise.  Mammo up to date. S/p hysterectomy therefore no pap.  She needs a colonoscopy and recently met with GI for rectal bleeding. I asked her to follow up with them re: need for colonoscopy.  She will also follow back up with Nephrology for her consultation.  Ophthalmology will see her for her left eyelid non-healing stye.  Encouraged her to get her covid booster at the pharmacy.

## 2022-03-28 NOTE — Progress Notes (Signed)
Subjective:   By signing my name below, I, Madelin Rear, attest that this documentation has been prepared under the direction and in the presence of Debbrah Alar, NP.  03/28/2022.   Patient ID: Nicole Quinn, female    DOB: [birth year], 47 y.o.   MRN: [id number]  Chief Complaint  Patient presents with   Annual Exam    HPI Patient is in today for a comprehensive physical exam.  Blood Pressure:  Her blood pressure was a little high initially at 131/98. She attributes this to a stressful morning. On manual recheck of her right arm, her BP is 118/82. BP Readings from Last 3 Encounters:  03/28/22 118/82  03/18/22 136/81  02/18/22 (!) 124/91   Social history:  No changes to her family medical history. No alcohol or drug use. No smoking history. Her children are no longer living with her.  Colonoscopy: She follows with Dr. Collene Mares. No bleeding issues recently. However she does complain of diarrhea consisting of just water as a side effect of Trulance.  Mammogram:  Last completed  01/24/2022.  Immunizations:  Covid vaccine last received 10/22/2019. Tdap last received 02/22/2013. Influenza vaccine last received 12/19/2021.  Diet/Exercise:  She is trying to work on dietary changes and exercise.  Dental: She is up to date.  Vision: She is up to date.  Denies having any fever, new muscle pain, joint pain, new moles, congestion, sinus pain, sore throat, chest pain, palpitations, cough, SOB, wheezing, constipation, blood in stool, dysuria, frequency, hematuria, at this time.  Past Medical History:  Diagnosis Date   Acute renal failure (ARF) ([place]) 10/26/2020   Allergy    Anemia    CKD (chronic kidney disease) stage 3, GFR 30-59 ml/min ([employer]) 04/09/2021   Endometriosis    History of echocardiogram    Echo 6/18: EF 60-65, no RWMA, normal diastolic function   History of nuclear stress test    Myoview 6/18: EF 60, normal perfusion, Low Risk   History of UTI    Hypertension    pregnancy  induced HTN/pre-eclampsia //  on meds since 2nd child (2000)    Past Surgical History:  Procedure Laterality Date   ABDOMINAL HYSTERECTOMY  2003   partial   BREAST BIOPSY Left 10/01/2017   BREAST BIOPSY Right    CARPAL TUNNEL RELEASE     right   GANGLION CYST EXCISION     right   Hammer Toe Repair Bilateral 12/29/2013   Lt #3, #4, Rt #2.    Family History  Problem Relation Age of Onset   Allergies Daughter    Allergic rhinitis Daughter    Allergies Son    Allergic rhinitis Son    Leukemia Paternal Grandmother    Arthritis Mother    Hyperlipidemia Mother    Hypertension Mother    Stroke Mother    Allergic rhinitis Mother    Hypertension Father    Diabetes Father    Arthritis Maternal Grandmother    Stroke Maternal Grandmother 70   Hypertension Maternal Grandmother    Heart attack Maternal Grandmother 60   Coronary artery disease Maternal Aunt 50   Coronary artery disease Maternal Aunt 50   Angioedema Neg Hx    Asthma Neg Hx    Eczema Neg Hx    Immunodeficiency Neg Hx    Urticaria Neg Hx     Social History   Socioeconomic History   Marital status: Married    Spouse name: Not on file   Number of children: 2  Years of education: Not on file   Highest education level: Not on file  Occupational History    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    Married.     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      Social Determinants of Health   Financial Resource Strain: Not on file  Food Insecurity: Not on file  Transportation Needs: Not on file   Physical Activity: Not on file  Stress: Not on file  Social Connections: Not on file  Intimate Partner Violence: Not on file    Outpatient Medications Prior to Visit  Medication Sig Dispense Refill   betamethasone dipropionate 0.05 % cream APPLY  CREAM TOPICALLY TWICE DAILY 30 g 0   metoprolol succinate (TOPROL-XL) 25 MG 24 hr tablet Take 1 tablet (25 mg total) by mouth daily. 90 tablet 0   Multiple Vitamin (MULTIVITAMIN) tablet Take 1 tablet by mouth daily.     potassium chloride SA (KLOR-CON M) 20 MEQ tablet Take 3 tablets (60 mEq total) by mouth 3 (three) times daily. 360 tablet 6   spironolactone (ALDACTONE) 50 MG tablet Take 1 tablet by mouth twice daily 180 tablet 0   No facility-administered medications prior to visit.    Allergies  Allergen Reactions   Amoxicillin     rash   Ace Inhibitors     REACTION: lip swelling    Review of Systems  Constitutional:  Negative for fever.  HENT:  Negative for congestion, sinus pain and sore throat.   Respiratory:  Negative for cough, shortness of breath and wheezing.   Cardiovascular:  Negative for chest pain and palpitations.  Gastrointestinal:  Positive for diarrhea. Negative for blood in stool, constipation, nausea and vomiting.  Genitourinary:  Negative for dysuria, frequency and hematuria.  Musculoskeletal:  Negative for joint pain and myalgias.       Objective:    Physical Exam Constitutional:      Appearance: Normal appearance.  HENT:     Head: Normocephalic and atraumatic.     Right Ear: Tympanic membrane, ear canal and external ear normal.     Left Ear: Tympanic membrane, ear canal and external ear normal.     Mouth/Throat:     Mouth: Mucous membranes are moist.     Pharynx: Oropharynx is clear. No oropharyngeal exudate.  Eyes:     Extraocular Movements: Extraocular movements intact.     Pupils: Pupils are equal, round, and reactive to light.     Comments: Left upper lid hordeolum- unchanged since last visit   Cardiovascular:     Rate and Rhythm: Normal rate and regular rhythm.     Heart sounds: Normal heart sounds. No murmur heard.    No gallop.  Pulmonary:     Effort: Pulmonary effort is normal. No respiratory distress.     Breath sounds: Normal breath sounds. No wheezing or rales.  Abdominal:     General: Bowel sounds are normal. There is no distension.  Palpations: Abdomen is soft.     Tenderness: There is no abdominal tenderness. There is no guarding.  Musculoskeletal:        General: Normal range of motion.  Skin:    General: Skin is warm and dry.  Neurological:     General: No focal deficit present.     Mental Status: She is alert and oriented to person, place, and time.     Motor: Motor function is intact.     Coordination: Coordination is intact.     Comments: 5/5 muscle strength of bilateral upper extremities.  Psychiatric:        Mood and Affect: Mood normal.        Behavior: Behavior normal.     BP 118/82   Pulse 83   Temp 98.7 F (37.1 C) (Oral)   Resp 16   Ht '5\' 4"'$  (1.626 m)   Wt 178 lb (80.7 kg)   SpO2 100%   BMI 30.55 kg/m  Wt Readings from Last 3 Encounters:  03/28/22 178 lb (80.7 kg)  03/18/22 182 lb (82.6 kg)  02/18/22 182 lb (82.6 kg)      Assessment & Plan:   Problem List Items Addressed This Visit       Unprioritized   Routine general medical examination at a health care facility    Discussed healthy diet, exercise.  Mammo up to date. S/p hysterectomy therefore no pap.  She needs a colonoscopy and recently met with GI for rectal bleeding. I asked her to follow up with them re: need for colonoscopy.  She will also follow back up with Nephrology for her consultation.  Ophthalmology will see her for her left eyelid non-healing stye.  Encouraged her to get her covid booster at the pharmacy.        CKD (chronic kidney disease) stage 3, GFR 30-59 ml/min ([facility])   Relevant Orders   Comp Met (CMET)   Other Visit Diagnoses     Hyperlipidemia,  unspecified hyperlipidemia type    -  Primary   Relevant Orders   Lipid panel        No orders of the defined types were placed in this encounter.   I, [name], NP, personally preformed the services described in this documentation.  All medical record entries made by the scribe were at my direction and in my presence.  I have reviewed the chart and discharge instructions (if applicable) and agree that the record reflects my personal performance and is accurate and complete.  03/28/2022.  I,Mathew Stumpf,acting as a Education administrator for [employer], NP.,have documented all relevant documentation on the behalf of [name], NP,as directed by  [name], NP while in the presence of [name], NP.   [name], NP

## 2022-04-10 ENCOUNTER — Encounter: Payer: Self-pay | Admitting: Podiatry

## 2022-04-14 ENCOUNTER — Telehealth: Payer: Self-pay | Admitting: Urology

## 2022-04-14 ENCOUNTER — Encounter: Payer: Self-pay | Admitting: Podiatry

## 2022-04-14 ENCOUNTER — Ambulatory Visit: Payer: BC Managed Care – PPO | Admitting: Podiatry

## 2022-04-14 DIAGNOSIS — M2041 Other hammer toe(s) (acquired), right foot: Secondary | ICD-10-CM | POA: Diagnosis not present

## 2022-04-14 DIAGNOSIS — M2042 Other hammer toe(s) (acquired), left foot: Secondary | ICD-10-CM

## 2022-04-14 DIAGNOSIS — M21619 Bunion of unspecified foot: Secondary | ICD-10-CM

## 2022-04-14 DIAGNOSIS — M21612 Bunion of left foot: Secondary | ICD-10-CM | POA: Diagnosis not present

## 2022-04-14 NOTE — Progress Notes (Signed)
Subjective:   Patient ID: Nicole Quinn, female   DOB: 47 y.o.   MRN: [id number]   HPI Patient presents stating that she is ready for surgery and wants to review what will be done with painful bunion deformity left and second toe and also the fourth toe left has been sore as she is trying to wear shoe gear   ROS      Objective:  Physical Exam  Neurovascular status intact structural bunion left with redness with spurring of the dorsal surface along with digital deformity second and fourth toe left foot     Assessment:  HAV deformity symptomatic painful hammertoe deformity second and fourth left     Plan:  H&P reviewed discussed conditions at great length reviewed the consent form going over osteotomy first metatarsal left with removal of bone spur along with digital fusion digit to left and arthroplasty digit for explaining surgery and allowing her to review and then read consent form line bilaterally with all questions answered.  I explained to her all possible complications and today I did dispense air fracture walker properly fitted to her lower leg and I want her to wear this prior to surgery to get used to it so that she will be able to wear it postoperative.  All questions answered scheduled for outpatient surgery with patient understanding recovery can take approximately 6 months for full final recovery

## 2022-04-14 NOTE — Telephone Encounter (Addendum)
DOS - 04/29/22  [place] BUNIONECTOMY LEFT --- ID:[id number] HAMMERTOE REPAIR 2,4 LEFT --- [id number]  [employer]   DR. Paulla Dolly DID A PEER TO PEER WITH DR. [place] AND CPT CODES 47425 AND 95638 HAVE BEEN APPROVED, AUTH # [id number], GOOD FROM 04/29/22 - 06/27/22.   04/21/22  DR. REGAL DID A PEER TO PEER WITH DR. Ammie Ferrier WITH [facility] FOR THE ADDED CODE 75643. CODE HAS BEEN APPROVED, AUTH # [id number], GOOD FROM 04/29/22 - 06/27/22.

## 2022-04-23 ENCOUNTER — Ambulatory Visit: Payer: BC Managed Care – PPO | Admitting: Podiatry

## 2022-04-28 MED ORDER — OXYCODONE-ACETAMINOPHEN 5-325 MG PO TABS: 1.0000 | ORAL_TABLET | Freq: Four times a day (QID) | ORAL | 0 refills | Status: AC | PRN

## 2022-04-28 MED ORDER — ONDANSETRON HCL 4 MG PO TABS: 4.0000 mg | ORAL_TABLET | Freq: Three times a day (TID) | ORAL | 0 refills | Status: DC | PRN

## 2022-04-28 NOTE — Addendum Note (Signed)
Addended by: Lenn Sink on: 04/28/2022 04:40 PM   Modules accepted: Orders

## 2022-04-29 DIAGNOSIS — M21612 Bunion of left foot: Secondary | ICD-10-CM | POA: Diagnosis not present

## 2022-04-29 DIAGNOSIS — M2042 Other hammer toe(s) (acquired), left foot: Secondary | ICD-10-CM | POA: Diagnosis not present

## 2022-04-29 DIAGNOSIS — M2012 Hallux valgus (acquired), left foot: Secondary | ICD-10-CM | POA: Diagnosis not present

## 2022-04-30 ENCOUNTER — Encounter: Payer: Self-pay | Admitting: Podiatry

## 2022-05-01 ENCOUNTER — Telehealth: Payer: Self-pay | Admitting: Podiatry

## 2022-05-01 NOTE — Telephone Encounter (Signed)
I responded to her.

## 2022-05-01 NOTE — Telephone Encounter (Signed)
Patient called and stated she just had sx. She is still in severe pain and could not rest last night. Her pain meds are not even touching the pain.  Patient would like to know what to do

## 2022-05-02 ENCOUNTER — Other Ambulatory Visit: Payer: Self-pay | Admitting: Family

## 2022-05-02 ENCOUNTER — Other Ambulatory Visit: Payer: Self-pay | Admitting: Podiatry

## 2022-05-02 MED ORDER — HYDROMORPHONE HCL 4 MG PO TABS: 4.0000 mg | ORAL_TABLET | ORAL | 0 refills | Status: AC | PRN

## 2022-05-02 NOTE — Telephone Encounter (Signed)
Please check on her and see if we need to write another pain medication

## 2022-05-05 ENCOUNTER — Ambulatory Visit (INDEPENDENT_AMBULATORY_CARE_PROVIDER_SITE_OTHER): Payer: BC Managed Care – PPO

## 2022-05-05 ENCOUNTER — Ambulatory Visit (INDEPENDENT_AMBULATORY_CARE_PROVIDER_SITE_OTHER): Payer: BC Managed Care – PPO | Admitting: Podiatry

## 2022-05-05 ENCOUNTER — Encounter: Payer: Self-pay | Admitting: Podiatry

## 2022-05-05 DIAGNOSIS — M21612 Bunion of left foot: Secondary | ICD-10-CM | POA: Diagnosis not present

## 2022-05-05 DIAGNOSIS — M21619 Bunion of unspecified foot: Secondary | ICD-10-CM | POA: Diagnosis not present

## 2022-05-05 NOTE — Progress Notes (Signed)
Subjective:   Patient ID: Nicole Quinn, female   DOB: 47 y.o.   MRN: [id number]   HPI Patient states that she is doing fine with minimal discomfort did have some pain earlier but that is better neurovascular   ROS      Objective:  Physical Exam  Status intact negative Denna Haggard' sign noted left foot healing well wound edges well coapted hallux in rectus position pin intact second digit     Assessment:  Doing well post forefoot reconstruction left     Plan:  Reviewed condition recommended continuation of conservative care continuation of boot and surgical shoe usage and compression.  Reappoint in 2 weeks suture removal earlier if needed and gave instructions for range of motion exercises  X-rays indicate osteotomies healing well fixation in place joint congruence second toe healing well

## 2022-05-07 ENCOUNTER — Encounter: Payer: Self-pay | Admitting: Podiatry

## 2022-05-09 ENCOUNTER — Ambulatory Visit: Payer: BC Managed Care – PPO | Admitting: Obstetrics and Gynecology

## 2022-05-09 ENCOUNTER — Other Ambulatory Visit (HOSPITAL_COMMUNITY)
Admission: RE | Admit: 2022-05-09 | Discharge: 2022-05-09 | Disposition: A | Discharge status: Home / Self Care | Payer: BC Managed Care – PPO | Source: Ambulatory Visit | Attending: Obstetrics and Gynecology | Admitting: Obstetrics and Gynecology

## 2022-05-09 ENCOUNTER — Encounter: Payer: Self-pay | Admitting: Obstetrics and Gynecology

## 2022-05-09 VITALS — BP 113/79 | HR 93 | Ht 64.0 in

## 2022-05-09 DIAGNOSIS — N898 Other specified noninflammatory disorders of vagina: Secondary | ICD-10-CM | POA: Insufficient documentation

## 2022-05-09 NOTE — Progress Notes (Signed)
NEW GYNECOLOGY PATIENT Patient name: Nicole Quinn MRN [id number]  Date of birth: 12-Jul-1975 Chief Complaint:   Vaginal Bleeding     History:  Nicole Quinn is a 47 y.o. G2P2 being seen today for vaginal bleeding. Had a hysterectomy about 20+ years ago - not sure if she has a cervix but not getting pap smears. She noted spotting and when she uses the restroom.  Noted some blood dripped into toilet. No burning with urination.  Started in February.  Hasn't seen aything in the last month - was going on for a few weeks previously. Consistently when voiding she would see the spotting. Sexually active - intermittent pain with intercourse; sharp pain, does not feel dry, more so uncomfortable. Has constipation at baseline.      Gynecologic History No LMP recorded. Patient has had a hysterectomy. Contraception: status post hysterectomy Last Pap: n/a Last Mammogram: 01/2022 BIRADS 1   Obstetric History OB History  Gravida Para Term Preterm AB Living  SAB IAB Ectopic Multiple Live Births          2    # Outcome Date GA Lbr Len/2nd Weight Sex Delivery Anes PTL Lv  2 Para      Vag-Spont     1 Para      Vag-Spont       Past Medical History:  Diagnosis Date   Acute renal failure (ARF) 10/26/2020   Allergy    Anemia    CKD (chronic kidney disease) stage 3, GFR 30-59 ml/min 04/09/2021   Endometriosis    History of echocardiogram    Echo 6/18: EF 60-65, no RWMA, normal diastolic function   History of nuclear stress test    Myoview 6/18: EF 60, normal perfusion, Low Risk   History of UTI    Hypertension    pregnancy induced HTN/pre-eclampsia //  on meds since 2nd child (2000)    Past Surgical History:  Procedure Laterality Date   ABDOMINAL HYSTERECTOMY  2003   partial   BREAST BIOPSY Left 10/01/2017   BREAST BIOPSY Right    CARPAL TUNNEL RELEASE     right   GANGLION CYST EXCISION     right   Hammer Toe Repair Bilateral 12/29/2013   Lt #3, #4, Rt #2.     Current Outpatient Medications on File Prior to Visit  Medication Sig Dispense Refill   betamethasone dipropionate 0.05 % cream APPLY  CREAM TOPICALLY TWICE DAILY 30 g 0   metoprolol succinate (TOPROL-XL) 25 MG 24 hr tablet Take 1 tablet (25 mg total) by mouth daily. 90 tablet 0   Multiple Vitamin (MULTIVITAMIN) tablet Take 1 tablet by mouth daily.     ondansetron (ZOFRAN) 4 MG tablet Take 1 tablet (4 mg total) by mouth every 8 (eight) hours as needed for nausea or vomiting. 20 tablet 0   potassium chloride SA (KLOR-CON M) 20 MEQ tablet Take 3 tablets (60 mEq total) by mouth 3 (three) times daily. 360 tablet 6   spironolactone (ALDACTONE) 50 MG tablet Take 1 tablet by mouth twice daily (Patient not taking: Reported on 05/09/2022) 180 tablet 0   No current facility-administered medications on file prior to visit.    Allergies  Allergen Reactions   Amoxicillin     rash   Ace Inhibitors     REACTION: lip swelling    Social History:  reports that she has never smoked. She has never used smokeless tobacco.  She reports that she does not drink alcohol and does not use drugs.  Family History  Problem Relation Age of Onset   Allergies Daughter    Allergic rhinitis Daughter    Allergies Son    Allergic rhinitis Son    Leukemia Paternal Grandmother    Arthritis Mother    Hyperlipidemia Mother    Hypertension Mother    Stroke Mother    Allergic rhinitis Mother    Hypertension Father    Diabetes Father    Arthritis Maternal Grandmother    Stroke Maternal Grandmother 50   Hypertension Maternal Grandmother    Heart attack Maternal Grandmother 86   Coronary artery disease Maternal Aunt 50   Coronary artery disease Maternal Aunt 50   Angioedema Neg Hx    Asthma Neg Hx    Eczema Neg Hx    Immunodeficiency Neg Hx    Urticaria Neg Hx     The following portions of the patient's history were reviewed and updated as appropriate: allergies, current medications, past family history, past  medical history, past social history, past surgical history and problem list.  Review of Systems Pertinent items noted in HPI and remainder of comprehensive ROS otherwise negative.  Physical Exam:  BP 113/79   Pulse 93   Ht  (1.626 m)   BMI 30.55 kg/m  Physical Exam Vitals and nursing note reviewed. Exam conducted with a chaperone present.  Constitutional:      Appearance: Normal appearance.  Cardiovascular:     Rate and Rhythm: Normal rate.  Pulmonary:     Effort: Pulmonary effort is normal.     Breath sounds: Normal breath sounds.  Genitourinary:    General: Normal vulva.     Vagina: Normal.     Comments: Normal external genitalia No external hemorrhoids noted Normal appearing urethra Normal vaginal canal White discharge noted within the vault Nontender bilateral levator ani Mild tenderness of cuff - very posterior, likely secondary to suspension procedure No cervix seen Neurological:     General: No focal deficit present.     Mental Status: She is alert and oriented to person, place, and time.  Psychiatric:        Mood and Affect: Mood normal.        Behavior: Behavior normal.        Thought Content: Thought content normal.        Judgment: Judgment normal.      Assessment and Plan:   1. Vaginal irritation Vaginal discharge noted on exam, concern for vaginitis. No lesions noted within the vaginal canal. Reviewed paps not needed. Will follow up vaginitis swab.   Routine preventative health maintenance measures emphasized. Please refer to After Visit Summary for other counseling recommendations.   Follow-up: Return if symptoms worsen or fail to improve.      Lorriane Shire, MD Obstetrician & Gynecologist, [facility] [employer], [facility]

## 2022-05-09 NOTE — Progress Notes (Signed)
Patient states she noticed some vaginal bleeding. Patient denies any pain. Patient had hysterectomy 20+ years. Armandina Stammer RN

## 2022-05-12 LAB — CERVICOVAGINAL ANCILLARY ONLY
Bacterial Vaginitis (gardnerella): NEGATIVE
Candida Glabrata: NEGATIVE
Candida Vaginitis: NEGATIVE
Comment: NEGATIVE
Comment: NEGATIVE
Comment: NEGATIVE

## 2022-05-19 ENCOUNTER — Ambulatory Visit (INDEPENDENT_AMBULATORY_CARE_PROVIDER_SITE_OTHER): Payer: BC Managed Care – PPO

## 2022-05-19 ENCOUNTER — Encounter (INDEPENDENT_AMBULATORY_CARE_PROVIDER_SITE_OTHER): Payer: BC Managed Care – PPO | Admitting: Family

## 2022-05-19 ENCOUNTER — Ambulatory Visit (INDEPENDENT_AMBULATORY_CARE_PROVIDER_SITE_OTHER): Payer: BC Managed Care – PPO | Admitting: Podiatry

## 2022-05-19 ENCOUNTER — Encounter: Payer: Self-pay | Admitting: Podiatry

## 2022-05-19 DIAGNOSIS — M21612 Bunion of left foot: Secondary | ICD-10-CM

## 2022-05-19 DIAGNOSIS — T7840XA Allergy, unspecified, initial encounter: Secondary | ICD-10-CM | POA: Diagnosis not present

## 2022-05-19 DIAGNOSIS — M2042 Other hammer toe(s) (acquired), left foot: Secondary | ICD-10-CM

## 2022-05-19 DIAGNOSIS — M21619 Bunion of unspecified foot: Secondary | ICD-10-CM | POA: Diagnosis not present

## 2022-05-19 DIAGNOSIS — M2041 Other hammer toe(s) (acquired), right foot: Secondary | ICD-10-CM

## 2022-05-19 MED ORDER — PREDNISONE 10 MG PO TABS: ORAL_TABLET | ORAL | 0 refills | Status: DC

## 2022-05-19 NOTE — Progress Notes (Signed)
Subjective:   Patient ID: Nicole Quinn, female   DOB: 47 y.o.   MRN: [id number]   HPI Patient states doing really well with foot ready to get the pins out of the second and third toe   ROS      Objective:  Physical Exam  Neuro vascular status intact with pins in place second and third digits just starting to move out the distal direction in the second toe with good alignment noted first MPJ healing well     Assessment:  Doing very well post osteotomy fusion and bunion surgery     Plan:  H&P he reviewed pins removed sterile dressings applied x-ray taken may gradual return to shoe gear over the next couple weeks continue rigid bottom shoes reappoint 5 weeks or earlier if necessary  X-rays indicate fixation is in place no indications currently of movement with good alignment noted

## 2022-05-19 NOTE — Telephone Encounter (Signed)

## 2022-06-02 ENCOUNTER — Ambulatory Visit (INDEPENDENT_AMBULATORY_CARE_PROVIDER_SITE_OTHER): Payer: BC Managed Care – PPO

## 2022-06-02 ENCOUNTER — Encounter: Payer: Self-pay | Admitting: Podiatry

## 2022-06-02 ENCOUNTER — Ambulatory Visit (INDEPENDENT_AMBULATORY_CARE_PROVIDER_SITE_OTHER): Payer: BC Managed Care – PPO | Admitting: Podiatry

## 2022-06-02 DIAGNOSIS — M2042 Other hammer toe(s) (acquired), left foot: Secondary | ICD-10-CM

## 2022-06-02 DIAGNOSIS — M2041 Other hammer toe(s) (acquired), right foot: Secondary | ICD-10-CM

## 2022-06-04 ENCOUNTER — Other Ambulatory Visit (HOSPITAL_COMMUNITY)
Admission: RE | Admit: 2022-06-04 | Discharge: 2022-06-04 | Disposition: A | Discharge status: Home / Self Care | Payer: BC Managed Care – PPO | Source: Ambulatory Visit | Attending: Family | Admitting: Family

## 2022-06-04 ENCOUNTER — Ambulatory Visit (INDEPENDENT_AMBULATORY_CARE_PROVIDER_SITE_OTHER): Payer: BC Managed Care – PPO | Admitting: Family

## 2022-06-04 VITALS — BP 132/94 | HR 96 | Resp 18 | Ht 64.0 in | Wt 167.2 lb

## 2022-06-04 DIAGNOSIS — N898 Other specified noninflammatory disorders of vagina: Secondary | ICD-10-CM | POA: Insufficient documentation

## 2022-06-04 DIAGNOSIS — R21 Rash and other nonspecific skin eruption: Secondary | ICD-10-CM | POA: Diagnosis not present

## 2022-06-04 MED ORDER — CETIRIZINE HCL 10 MG PO TABS: 10.0000 mg | ORAL_TABLET | Freq: Every day | ORAL | 11 refills | Status: DC

## 2022-06-04 MED ORDER — BETAMETHASONE DIPROPIONATE 0.05 % EX CREA: TOPICAL_CREAM | CUTANEOUS | 0 refills | Status: DC

## 2022-06-04 NOTE — Progress Notes (Signed)
Subjective:     Patient ID: Nicole Quinn, female    DOB: 1975/05/30, 47 y.o.   MRN: [id number]  Chief Complaint  Patient presents with   Rash    Upper body   vaginal odor    Rash   Patient is in today with complaint of rash on her upper body. Had foot surgery last month.  She was given oxycodone, developed itching.  This was changed to hydrocodone with same issue.  Prednisone helped a little briefly but it worsened again after she came off prednisone  Also notes vaginal odor-reports that she noted on Sunday. Some white discharge, no new partners.   She has an appointment in June with allergy.   Health Maintenance Due  Topic Date Due   COVID-19 Vaccine (3 - 2023-24 season) 09/20/2021   COLONOSCOPY (Pts 45-50yrs Insurance coverage will need to be confirmed)  Never done    Past Medical History:  Diagnosis Date   Acute renal failure (ARF) (HCC) 10/26/2020   Allergy    Anemia    CKD (chronic kidney disease) stage 3, GFR 30-59 ml/min (HCC) 04/09/2021   Endometriosis    History of echocardiogram    Echo 6/18: EF 60-65, no RWMA, normal diastolic function   History of nuclear stress test    Myoview 6/18: EF 60, normal perfusion, Low Risk   History of UTI    Hypertension    pregnancy induced HTN/pre-eclampsia //  on meds since 2nd child (2000)    Past Surgical History:  Procedure Laterality Date   ABDOMINAL HYSTERECTOMY  2003   partial   BREAST BIOPSY Left 10/01/2017   BREAST BIOPSY Right    CARPAL TUNNEL RELEASE     right   GANGLION CYST EXCISION     right   Hammer Toe Repair Bilateral 12/29/2013   Lt #3, #4, Rt #2.    Family History  Problem Relation Age of Onset   Allergies Daughter    Allergic rhinitis Daughter    Allergies Son    Allergic rhinitis Son    Leukemia Paternal Grandmother    Arthritis Mother    Hyperlipidemia Mother    Hypertension Mother    Stroke Mother    Allergic rhinitis Mother    Hypertension Father    Diabetes Father     Arthritis Maternal Grandmother    Stroke Maternal Grandmother 50   Hypertension Maternal Grandmother    Heart attack Maternal Grandmother 37   Coronary artery disease Maternal Aunt 50   Coronary artery disease Maternal Aunt 50   Angioedema Neg Hx    Asthma Neg Hx    Eczema Neg Hx    Immunodeficiency Neg Hx    Urticaria Neg Hx     Social History   Socioeconomic History   Marital status: Married    Spouse name: Not on file   Number of children: 2   Years of education: Not on file   Highest education level: Associate degree: occupational, Scientist, product/process development, or vocational program  Occupational History    Employer: [employer]    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    Married.     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      Social Determinants of Health   Financial Resource Strain: Low Risk  (06/03/2022)   Overall Financial Resource Strain (CARDIA)    Difficulty of Paying Living Expenses: Not hard at all  Food Insecurity: No Food Insecurity (06/03/2022)   Hunger Vital Sign    Worried About Running Out of Food in the Last Year: Never true    Ran Out of Food in the Last Year: Never true  Transportation Needs: No Transportation Needs (06/03/2022)   PRAPARE - Administrator, Civil Service (Medical): No    Lack of Transportation (Non-Medical): No  Physical Activity: Sufficiently Active (06/03/2022)   Exercise Vital Sign    Days of Exercise per Week: 4 days    Minutes of Exercise per Session: 50 min  Stress: No Stress Concern Present (06/03/2022)   [employer] of Occupational  Health - Occupational Stress Questionnaire    Feeling of Stress : Not at all  Social Connections: Moderately Integrated (06/03/2022)   Social Connection and Isolation Panel [NHANES]    Frequency of Communication with Friends and Family: More than three times a week    Frequency of Social Gatherings with Friends and Family: More than three times a week    Attends Religious Services: 1 to 4 times per year    Active Member of [employer] or Organizations: Yes    Attends Banker Meetings: 1 to 4 times per year    Marital Status: Separated  Intimate Partner Violence: Not on file    Outpatient Medications Prior to Visit  Medication Sig Dispense Refill   metoprolol succinate (TOPROL-XL) 25 MG 24 hr tablet Take 1 tablet (25 mg total) by mouth daily. 90 tablet 0   Multiple Vitamin (MULTIVITAMIN) tablet Take 1 tablet by mouth daily.     ondansetron (ZOFRAN) 4 MG tablet Take 1 tablet (4 mg total) by mouth every 8 (eight) hours as needed for nausea or vomiting. 20 tablet 0   potassium chloride SA (KLOR-CON M) 20 MEQ tablet Take 3 tablets (60 mEq total) by mouth 3 (three) times daily. 360 tablet 6   predniSONE (DELTASONE) 10 MG tablet 4 tabs by mouth once daily for 2 days, then 3 tabs daily x 2 days, then 2 tabs daily x 2 days, then 1 tab daily x 2 days 20 tablet 0   spironolactone (ALDACTONE) 50 MG tablet Take 1 tablet by mouth twice daily (Patient not taking: Reported on 05/09/2022) 180 tablet 0   betamethasone dipropionate 0.05 % cream APPLY  CREAM TOPICALLY TWICE DAILY 30 g 0   No facility-administered medications prior to visit.    Allergies  Allergen Reactions   Amoxicillin     rash   Ace Inhibitors     REACTION: lip swelling    Review of Systems  Skin:  Positive for rash.       Objective:    Physical Exam Exam conducted with a chaperone present.  Constitutional:      Appearance: Normal appearance.  Genitourinary:    General: Normal vulva.     Exam position: Lithotomy  position.  Skin:    General: Skin is warm and dry.     Comments: Fine maculopapular rash noted on trunk.   Neurological:     Mental Status: She is alert.  Psychiatric:  Mood and Affect: Mood normal.        Behavior: Behavior normal.        Thought Content: Thought content normal.        Judgment: Judgment normal.     BP (!) 132/94   Pulse 96   Resp 18   Ht 5\' 4"  (1.626 m)   Wt 167 lb 3.2 oz (75.8 kg)   SpO2 98%   BMI 28.70 kg/m  Wt Readings from Last 3 Encounters:  06/04/22 167 lb 3.2 oz (75.8 kg)  03/28/22 178 lb (80.7 kg)  03/18/22 182 lb (82.6 kg)        Assessment & Plan:   Problem List Items Addressed This Visit       Unprioritized   Vaginal odor    New. Suspect BV, swab will be sent for BV and yeast.       Relevant Orders   Cervicovaginal ancillary only( [facility])   Skin rash - Primary    Uncontrolled. Advised pt to change detergents to free and clear. Add zyrtec 10mg  once daily, topical betamethasone cream prn and keep appointment with dermatology.       Relevant Medications   cetirizine (ZYRTEC) 10 MG tablet    I am having Sueann M. Schlegel start on cetirizine. I am also having her maintain her multivitamin, potassium chloride SA, metoprolol succinate, ondansetron, spironolactone, predniSONE, and betamethasone dipropionate.  Meds ordered this encounter  Medications   cetirizine (ZYRTEC) 10 MG tablet    Sig: Take 1 tablet (10 mg total) by mouth daily.    Dispense:  30 tablet    Refill:  11    Order Specific Question:   Supervising Provider    Answer:   Danise Edge A [[id number]]   betamethasone dipropionate 0.05 % cream    Sig: APPLY  CREAM TOPICALLY TWICE DAILY    Dispense:  45 g    Refill:  0    Order Specific Question:   Supervising Provider    Answer:   Danise Edge A [[id number]]

## 2022-06-04 NOTE — Patient Instructions (Signed)
Change to Free and Clear Detergent. Add zyrtec once daily.  You can apply betamethasone cream twice daily as needed to rash.

## 2022-06-04 NOTE — Assessment & Plan Note (Signed)
New. Suspect BV, swab will be sent for BV and yeast.

## 2022-06-04 NOTE — Assessment & Plan Note (Signed)
Uncontrolled. Advised pt to change detergents to free and clear. Add zyrtec 10mg  once daily, topical betamethasone cream prn and keep appointment with dermatology.

## 2022-06-04 NOTE — Progress Notes (Signed)
Subjective:   Patient ID: Nicole Quinn, female   DOB: 47 y.o.   MRN: [id number]   HPI Patient states doing well with surgery very pleased with minimal pain incisions healing well neurovascular   ROS      Objective:  Physical Exam  Status intact negative Denna Haggard' sign noted wound edges coapted well alignment good second toe and placed distal digit first metatarsal good with good range of motion     Assessment:  Patient healing well post surgery     Plan:  Pins removed second digit wound edges well coapted patient may slowly return to soft shoe gear instructed on range of motion exercises keeping the second toe down reappoint to recheck 4 weeks or earlier if needed  X-rays indicate good healing around the first metatarsal fixation in place joint congruence second digit good alignment currently

## 2022-06-05 ENCOUNTER — Telehealth: Payer: Self-pay | Admitting: Family

## 2022-06-05 ENCOUNTER — Encounter: Payer: Self-pay | Admitting: Podiatry

## 2022-06-05 LAB — CERVICOVAGINAL ANCILLARY ONLY
Bacterial Vaginitis (gardnerella): POSITIVE — AB
Candida Glabrata: NEGATIVE
Candida Vaginitis: NEGATIVE
Comment: NEGATIVE
Comment: NEGATIVE
Comment: NEGATIVE

## 2022-06-05 MED ORDER — METRONIDAZOLE 0.75 % VA GEL: 1.0000 | Freq: Every day | VAGINAL | 0 refills | Status: DC

## 2022-06-05 NOTE — Telephone Encounter (Signed)
See mychart.  

## 2022-06-30 ENCOUNTER — Other Ambulatory Visit: Payer: Self-pay | Admitting: Family

## 2022-07-02 ENCOUNTER — Ambulatory Visit: Payer: BC Managed Care – PPO | Admitting: Internal Medicine

## 2022-07-10 ENCOUNTER — Encounter: Payer: Self-pay | Admitting: Family

## 2022-07-11 MED ORDER — ALPRAZOLAM 0.5 MG PO TABS: 0.5000 mg | ORAL_TABLET | Freq: Three times a day (TID) | ORAL | 0 refills | Status: DC | PRN

## 2022-07-14 ENCOUNTER — Other Ambulatory Visit: Payer: Self-pay | Admitting: Podiatry

## 2022-07-14 ENCOUNTER — Encounter: Payer: Self-pay | Admitting: Podiatry

## 2022-07-14 ENCOUNTER — Ambulatory Visit (INDEPENDENT_AMBULATORY_CARE_PROVIDER_SITE_OTHER): Payer: BC Managed Care – PPO

## 2022-07-14 ENCOUNTER — Ambulatory Visit (INDEPENDENT_AMBULATORY_CARE_PROVIDER_SITE_OTHER): Payer: BC Managed Care – PPO | Admitting: Podiatry

## 2022-07-14 DIAGNOSIS — M21619 Bunion of unspecified foot: Secondary | ICD-10-CM

## 2022-07-14 DIAGNOSIS — M21612 Bunion of left foot: Secondary | ICD-10-CM

## 2022-07-14 NOTE — Progress Notes (Signed)
Subjective:   Patient ID: Nicole Quinn, female   DOB: 47 y.o.   MRN: [id number]   HPI Patient states overall doing well states still gets some swelling if she has been on it for long periods of time   ROS      Objective:  Physical Exam  Neurovascular status intact with patient found to have mild swelling still noted in the left foot but minimal.     Assessment:  Doing very well forefoot surgery left good recovery mild discomfort around the fifth metatarsal head which is probably walked to the outside of her foot     Plan:  Advised on continuing to increase her activity levels at this point I am very satisfied with where the patient is at and I do not see any big issues.  Patient will be seen back to recheck as needed and is discharged  X-rays indicate osteotomy is healing well fixation in place joint congruence digits in good alignment

## 2022-07-28 ENCOUNTER — Other Ambulatory Visit: Payer: Self-pay | Admitting: Family

## 2022-07-29 NOTE — Telephone Encounter (Signed)
Requesting: alprazolam 0.5mg  Contract: No UDS: none Last Visit: 06/04/2022 Next Visit: Visit date not found Last Refill: 07/11/22  Please Advise

## 2022-08-03 ENCOUNTER — Other Ambulatory Visit: Payer: Self-pay | Admitting: Family

## 2022-08-04 ENCOUNTER — Ambulatory Visit: Payer: BC Managed Care – PPO | Admitting: Family

## 2022-08-04 VITALS — BP 119/84 | HR 72 | Temp 97.8°F | Resp 16 | Wt 172.0 lb

## 2022-08-04 DIAGNOSIS — F4321 Adjustment disorder with depressed mood: Secondary | ICD-10-CM | POA: Insufficient documentation

## 2022-08-04 MED ORDER — HYDROXYZINE PAMOATE 25 MG PO CAPS: 25.0000 mg | ORAL_CAPSULE | Freq: Three times a day (TID) | ORAL | 2 refills | Status: DC | PRN

## 2022-08-04 NOTE — Progress Notes (Signed)
Subjective:     Patient ID: Nicole Quinn, female    DOB: 1975-09-24, 47 y.o.   MRN: [id number]  Chief Complaint  Patient presents with   Depression    Patient reports having increase in symptoms   Anxiety    Follow up    Depression        Past medical history includes anxiety.   Anxiety      Discussed the use of AI scribe software for clinical note transcription with the patient, who gave verbal consent to proceed.  History of Present Illness   The patient presents with grief and anxiety following the recent death of her son. Her adult son who lived with her was found deceased on the couch.  She and the family are waiting for information on toxicology in the hopes that they can have more answers on the cause of his unexpected death.  She describes her emotional state as fluctuating, with periods of agitation and a desire to 'shut down.' She has returned to work, which she finds both challenging and helpful as a distraction. She reports difficulty sleeping, but denies any other physical symptoms. She has been using Xanax to manage her anxiety and to aid sleep, but expresses concern about becoming dependent on it. She has not previously sought counseling, but is open to the idea of grief counseling.          Health Maintenance Due  Topic Date Due   COVID-19 Vaccine (3 - 2023-24 season) 09/20/2021   Colonoscopy  Never done    Past Medical History:  Diagnosis Date   Acute renal failure (ARF) (HCC) 10/26/2020   Allergy    Anemia    CKD (chronic kidney disease) stage 3, GFR 30-59 ml/min (HCC) 04/09/2021   Endometriosis    History of echocardiogram    Echo 6/18: EF 60-65, no RWMA, normal diastolic function   History of nuclear stress test    Myoview 6/18: EF 60, normal perfusion, Low Risk   History of UTI    Hypertension    pregnancy induced HTN/pre-eclampsia //  on meds since 2nd child (2000)    Past Surgical History:  Procedure Laterality Date   ABDOMINAL  HYSTERECTOMY  2003   partial   BREAST BIOPSY Left 10/01/2017   BREAST BIOPSY Right    CARPAL TUNNEL RELEASE     right   GANGLION CYST EXCISION     right   Hammer Toe Repair Bilateral 12/29/2013   Lt #3, #4, Rt #2.    Family History  Problem Relation Age of Onset   Allergies Daughter    Allergic rhinitis Daughter    Allergies Son    Allergic rhinitis Son    Leukemia Paternal Grandmother    Arthritis Mother    Hyperlipidemia Mother    Hypertension Mother    Stroke Mother    Allergic rhinitis Mother    Hypertension Father    Diabetes Father    Arthritis Maternal Grandmother    Stroke Maternal Grandmother 50   Hypertension Maternal Grandmother    Heart attack Maternal Grandmother 47   Coronary artery disease Maternal Aunt 50   Coronary artery disease Maternal Aunt 50   Angioedema Neg Hx    Asthma Neg Hx    Eczema Neg Hx    Immunodeficiency Neg Hx    Urticaria Neg Hx     Social History   Socioeconomic History   Marital status: Married    Spouse name: Not on file  Number of children: 2   Years of education: Not on file   Highest education level: Associate degree: occupational, Scientist, product/process development, or vocational program  Occupational History    Employer: [employer]    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    Married.     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      Social Determinants of Health   Financial Resource Strain: Low Risk  (06/03/2022)   Overall Financial Resource Strain (CARDIA)    Difficulty of Paying Living Expenses: Not hard at all  Food Insecurity: No Food  Insecurity (06/03/2022)   Hunger Vital Sign    Worried About Running Out of Food in the Last Year: Never true    Ran Out of Food in the Last Year: Never true  Transportation Needs: No Transportation Needs (06/03/2022)   PRAPARE - Administrator, Civil Service (Medical): No    Lack of Transportation (Non-Medical): No  Physical Activity: Sufficiently Active (06/03/2022)   Exercise Vital Sign    Days of Exercise per Week: 4 days    Minutes of Exercise per Session: 50 min  Stress: No Stress Concern Present (06/03/2022)   [employer] of Occupational Health - Occupational Stress Questionnaire    Feeling of Stress : Not at all  Social Connections: Moderately Integrated (06/03/2022)   Social Connection and Isolation Panel [NHANES]    Frequency of Communication with Friends and Family: More than three times a week    Frequency of Social Gatherings with Friends and Family: More than three times a week    Attends Religious Services: 1 to 4 times per year    Active Member of [employer] or Organizations: Yes    Attends Banker Meetings: 1 to 4 times per year    Marital Status: Separated  Intimate Partner Violence: Unknown (02/28/2022)   Received from [facility]   HITS    Physically Hurt: Not on file    Insult or Talk Down To: Not on file    Threaten Physical Harm: Not on file    Scream or Curse: Not on file    Outpatient Medications Prior to Visit  Medication Sig Dispense Refill   ALPRAZolam (XANAX) 0.5 MG tablet Take 1 tablet (0.5 mg total) by mouth 3 (three) times daily as needed for anxiety. 20 tablet 0   betamethasone dipropionate 0.05 % cream APPLY  CREAM TOPICALLY TWICE DAILY 45 g 0   cetirizine (ZYRTEC) 10 MG tablet Take 1 tablet (10 mg total) by mouth daily. 30 tablet 11   metoprolol succinate (TOPROL-XL) 25 MG 24 hr tablet Take 1 tablet (25 mg total) by mouth daily. 90 tablet 0   Multiple Vitamin (MULTIVITAMIN) tablet Take 1 tablet by mouth daily.     potassium  chloride SA (KLOR-CON M) 20 MEQ tablet Take 3 tablets (60 mEq total) by mouth 3 (three) times daily. 360 tablet 6   spironolactone (ALDACTONE) 50 MG tablet Take 1 tablet by mouth twice daily 180 tablet 0   ondansetron (  ZOFRAN) 4 MG tablet Take 1 tablet (4 mg total) by mouth every 8 (eight) hours as needed for nausea or vomiting. 20 tablet 0   predniSONE (DELTASONE) 10 MG tablet 4 tabs by mouth once daily for 2 days, then 3 tabs daily x 2 days, then 2 tabs daily x 2 days, then 1 tab daily x 2 days 20 tablet 0   No facility-administered medications prior to visit.    Allergies  Allergen Reactions   Amoxicillin     rash   Ace Inhibitors     REACTION: lip swelling    Review of Systems  Psychiatric/Behavioral:  Positive for depression.        Objective:    Physical Exam Constitutional:      Appearance: Normal appearance.  Pulmonary:     Effort: No respiratory distress.  Neurological:     Mental Status: She is alert.  Psychiatric:        Attention and Perception: Attention normal.        Mood and Affect: Affect is tearful.        Speech: Speech normal.        Behavior: Behavior normal.        Thought Content: Thought content normal.        Judgment: Judgment normal.      BP 119/84   Pulse 72   Temp 97.8 F (36.6 C) (Oral)   Resp 16   Wt 172 lb (78 kg)   SpO2 100%   BMI 29.52 kg/m  Wt Readings from Last 3 Encounters:  08/04/22 172 lb (78 kg)  06/04/22 167 lb 3.2 oz (75.8 kg)  03/28/22 178 lb (80.7 kg)       Assessment & Plan:   Problem List Items Addressed This Visit       Unprioritized   Grief reaction - Primary    Recent loss of a child with significant emotional distress and anxiety. Difficulty sleeping and moments of agitation. Benefited from Xanax but concerned about potential for addiction. -Refer to grief counseling. -Prescribe Hydroxyzine for anxiety and sleep as a non-habit forming alternative to Xanax. -Check in 3 months or sooner if patient feels  the need for daily anxiety/depression medication.       I have discontinued Nansi M. Mac's ondansetron and predniSONE. I am also having her start on hydrOXYzine. Additionally, I am having her maintain her multivitamin, potassium chloride SA, metoprolol succinate, cetirizine, betamethasone dipropionate, ALPRAZolam, and spironolactone.  Meds ordered this encounter  Medications   hydrOXYzine (VISTARIL) 25 MG capsule    Sig: Take 1 capsule (25 mg total) by mouth every 8 (eight) hours as needed (anxiety or insomnia).    Dispense:  30 capsule    Refill:  2    Order Specific Question:   Supervising Provider    Answer:   Danise Edge A [[id number]]

## 2022-08-04 NOTE — Assessment & Plan Note (Signed)
Recent loss of a child with significant emotional distress and anxiety. Difficulty sleeping and moments of agitation. Benefited from Xanax but concerned about potential for addiction. -Refer to grief counseling. -Prescribe Hydroxyzine for anxiety and sleep as a non-habit forming alternative to Xanax. -Check in 3 months or sooner if patient feels the need for daily anxiety/depression medication.

## 2022-08-04 NOTE — Patient Instructions (Signed)
VISIT SUMMARY:  During our visit, we discussed your feelings of grief and anxiety following the recent loss of your son. You mentioned that your emotional state fluctuates, with periods of agitation and a desire to 'shut down.' You have been using Xanax to manage your anxiety and to aid sleep, but expressed concern about becoming dependent on it. You are open to the idea of grief counseling.  YOUR PLAN:  -GRIEF AND ANXIETY: You are experiencing strong emotional distress and anxiety due to the recent loss of your son. To help you cope with these feelings, I am recommending grief counseling. I am also prescribing Hydroxyzine, a medication that can help with anxiety and sleep issues. Unlike Xanax, Hydroxyzine is not habit-forming. We will check in again in 3 months, or sooner if you feel the need for daily anxiety/depression medication.  INSTRUCTIONS:  Please start attending grief counseling as soon as possible. Begin taking the prescribed Hydroxyzine for your anxiety and sleep issues. If you feel the need for daily anxiety/depression medication, please contact me sooner than the scheduled 50-month check-in.

## 2022-08-05 ENCOUNTER — Other Ambulatory Visit: Payer: Self-pay | Admitting: Family

## 2022-08-05 NOTE — Telephone Encounter (Signed)
Pharmacy also called regarding the xanax refill fyi.

## 2022-08-08 ENCOUNTER — Encounter: Payer: Self-pay | Admitting: Family

## 2022-08-08 MED ORDER — POTASSIUM CHLORIDE CRYS ER 20 MEQ PO TBCR: 60.0000 meq | EXTENDED_RELEASE_TABLET | Freq: Three times a day (TID) | ORAL | 5 refills | Status: DC

## 2022-08-09 ENCOUNTER — Encounter: Payer: Self-pay | Admitting: Podiatry

## 2022-09-01 DIAGNOSIS — F4323 Adjustment disorder with mixed anxiety and depressed mood: Secondary | ICD-10-CM | POA: Diagnosis not present

## 2022-09-02 ENCOUNTER — Encounter: Payer: Self-pay | Admitting: Family

## 2022-09-15 DIAGNOSIS — F4323 Adjustment disorder with mixed anxiety and depressed mood: Secondary | ICD-10-CM | POA: Diagnosis not present

## 2022-09-17 ENCOUNTER — Telehealth: Payer: Self-pay

## 2022-09-17 ENCOUNTER — Ambulatory Visit: Payer: BC Managed Care – PPO | Admitting: Family

## 2022-09-17 ENCOUNTER — Other Ambulatory Visit: Payer: Self-pay

## 2022-09-17 VITALS — BP 114/85 | HR 87 | Temp 97.8°F | Resp 16 | Wt 182.0 lb

## 2022-09-17 DIAGNOSIS — F5102 Adjustment insomnia: Secondary | ICD-10-CM | POA: Insufficient documentation

## 2022-09-17 DIAGNOSIS — E876 Hypokalemia: Secondary | ICD-10-CM | POA: Diagnosis not present

## 2022-09-17 DIAGNOSIS — E875 Hyperkalemia: Secondary | ICD-10-CM

## 2022-09-17 DIAGNOSIS — F4321 Adjustment disorder with depressed mood: Secondary | ICD-10-CM

## 2022-09-17 DIAGNOSIS — I10 Essential (primary) hypertension: Secondary | ICD-10-CM

## 2022-09-17 LAB — BASIC METABOLIC PANEL
BUN: 23 mg/dL (ref 6–23)
CO2: 29 mEq/L (ref 19–32)
Calcium: 9.6 mg/dL (ref 8.4–10.5)
Chloride: 101 mEq/L (ref 96–112)
Creatinine, Ser: 1.17 mg/dL (ref 0.40–1.20)
GFR: 56.34 mL/min — ABNORMAL LOW (ref >60.00)
Glucose, Bld: 80 mg/dL (ref 70–99)
Potassium: 6.2 mEq/L (ref 3.5–5.1)
Sodium: 134 mEq/L — ABNORMAL LOW (ref 135–145)

## 2022-09-17 MED ORDER — TRAZODONE HCL 50 MG PO TABS: 25.0000 mg | ORAL_TABLET | Freq: Every evening | ORAL | 0 refills | Status: DC | PRN

## 2022-09-17 NOTE — Patient Instructions (Signed)
VISIT SUMMARY:  During our visit, we discussed your ongoing struggle with insomnia, grief and emotional distress due to the loss of a loved one, and your concern about your potassium levels. We also checked your blood pressure, which is well controlled at this time.  YOUR PLAN:  -INSOMNIA: The medication you were taking for sleep, hydroxyzine, has not been effective. We will stop this medication and start a new one called Trazodone. You will start with half a pill and can increase to a full pill if needed. Insomnia is a sleep disorder that makes it hard for you to fall asleep, stay asleep, or both.  -GRIEF AND EMOTIONAL DISTRESS: You are dealing with a significant loss, which is causing you emotional distress. It's good that you have started counseling sessions. Please continue these as scheduled. Grief is a natural response to loss, and it's different for everyone. It's not just about the emotional pain, but can also cause physical, cognitive, behavioral, social, and philosophical distress.  -HYPOKALEMIA: You mentioned feeling like your potassium levels are low. We will order lab work to check your current potassium levels. Hypokalemia is a condition where the potassium level in your blood is lower than normal.  -HYPERTENSION: Your blood pressure is well controlled at this visit. Please continue your current management. Hypertension, or high blood pressure, is a condition where the force of the blood against the artery walls is too high.  INSTRUCTIONS:  Please follow the new plan for your insomnia and continue your counseling sessions. We will check your potassium levels with lab work. Continue your current management for your blood pressure. Follow-up in three months or sooner if needed.

## 2022-09-17 NOTE — Assessment & Plan Note (Signed)
Hydroxyzine ineffective for sleep. Discussed options including Trazodone, Xanax, and Ambien. -Discontinue Hydroxyzine. -Start Trazodone, begin with half a pill and increase to full pill if needed.

## 2022-09-17 NOTE — Assessment & Plan Note (Signed)
  Blood pressure well controlled at this visit. -Continue current management.

## 2022-09-17 NOTE — Telephone Encounter (Signed)
[place] lab called with critical potassium value of 6.2. Per Sandford Craze patient was advised not to take any more potassium today and tomorrow. Patient will come in at 10:45 for stat BMET

## 2022-09-17 NOTE — Assessment & Plan Note (Signed)
Patient reports feeling like potassium is low. Currently on potassium supplementation. -Order lab work to check current potassium levels.

## 2022-09-17 NOTE — Assessment & Plan Note (Signed)
Ongoing grief related to a significant loss. Patient has initiated counseling sessions. -Continue counseling sessions as scheduled.

## 2022-09-17 NOTE — Progress Notes (Signed)
Subjective:     Patient ID: Nicole Quinn, female    DOB: Sep 16, 1975, 47 y.o.   MRN: [id number]  Chief Complaint  Patient presents with   Grief reaction    HPI  Discussed the use of AI scribe software for clinical note transcription with the patient, who gave verbal consent to proceed.  History of Present Illness   The patient, who lost her adult son unexpectedly 2 months ago, presents for follow up.  Last vist we gave her an rx for hydroxyzine for her insomnia. She reports that the hydroxyzine prescribed at the last visit has not improved her sleep. She continues to struggle with sleeplessness. The patient is also dealing with the loss of a loved one, and is awaiting autopsy results. She expresses frustration with the delay in receiving these results. She has been able to see her daughter and granddaughter, who are also dealing with the loss. The patient has started seeing a counselor, [facility], in [place], and has had two visits so far. She reports crying a lot during these sessions. The patient also mentions feeling like her potassium is "out of whack." She is currently taking three Kdur tablets three times a day.          Health Maintenance Due  Topic Date Due   COVID-19 Vaccine (3 - 2023-24 season) 09/20/2021   Colonoscopy  Never done   INFLUENZA VACCINE  08/21/2022    Past Medical History:  Diagnosis Date   Acute renal failure (ARF) (HCC) 10/26/2020   Allergy    Anemia    CKD (chronic kidney disease) stage 3, GFR 30-59 ml/min (HCC) 04/09/2021   Endometriosis    History of echocardiogram    Echo 6/18: EF 60-65, no RWMA, normal diastolic function   History of nuclear stress test    Myoview 6/18: EF 60, normal perfusion, Low Risk   History of UTI    Hypertension    pregnancy induced HTN/pre-eclampsia //  on meds since 2nd child (2000)    Past Surgical History:  Procedure Laterality Date   ABDOMINAL HYSTERECTOMY  2003   partial   BREAST BIOPSY Left  10/01/2017   BREAST BIOPSY Right    CARPAL TUNNEL RELEASE     right   GANGLION CYST EXCISION     right   Hammer Toe Repair Bilateral 12/29/2013   Lt #3, #4, Rt #2.    Family History  Problem Relation Age of Onset   Allergies Daughter    Allergic rhinitis Daughter    Allergies Son    Allergic rhinitis Son    Leukemia Paternal Grandmother    Arthritis Mother    Hyperlipidemia Mother    Hypertension Mother    Stroke Mother    Allergic rhinitis Mother    Hypertension Father    Diabetes Father    Arthritis Maternal Grandmother    Stroke Maternal Grandmother 50   Hypertension Maternal Grandmother    Heart attack Maternal Grandmother 18   Coronary artery disease Maternal Aunt 50   Coronary artery disease Maternal Aunt 50   Angioedema Neg Hx    Asthma Neg Hx    Eczema Neg Hx    Immunodeficiency Neg Hx    Urticaria Neg Hx     Social History   Socioeconomic History   Marital status: Married    Spouse name: Not on file   Number of children: 2   Years of education: Not on file   Highest education level: Associate degree:  occupational, Scientist, product/process development, or vocational program  Occupational History    Employer: [employer]    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    Married.     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      Social Determinants of Health   Financial Resource Strain: Low Risk  (06/03/2022)   Overall Financial Resource Strain (CARDIA)    Difficulty of Paying Living Expenses: Not hard at all  Food Insecurity: No Food Insecurity (06/03/2022)   Hunger Vital Sign    Worried  About Running Out of Food in the Last Year: Never true    Ran Out of Food in the Last Year: Never true  Transportation Needs: No Transportation Needs (06/03/2022)   PRAPARE - Administrator, Civil Service (Medical): No    Lack of Transportation (Non-Medical): No  Physical Activity: Sufficiently Active (06/03/2022)   Exercise Vital Sign    Days of Exercise per Week: 4 days    Minutes of Exercise per Session: 50 min  Stress: No Stress Concern Present (06/03/2022)   [employer] of Occupational Health - Occupational Stress Questionnaire    Feeling of Stress : Not at all  Social Connections: Moderately Integrated (06/03/2022)   Social Connection and Isolation Panel [NHANES]    Frequency of Communication with Friends and Family: More than three times a week    Frequency of Social Gatherings with Friends and Family: More than three times a week    Attends Religious Services: 1 to 4 times per year    Active Member of [employer] or Organizations: Yes    Attends Banker Meetings: 1 to 4 times per year    Marital Status: Separated  Intimate Partner Violence: Unknown (02/28/2022)   Received from [facility]   HITS    Physically Hurt: Not on file    Insult or Talk Down To: Not on file    Threaten Physical Harm: Not on file    Scream or Curse: Not on file    Outpatient Medications Prior to Visit  Medication Sig Dispense Refill   betamethasone dipropionate 0.05 % cream APPLY  CREAM TOPICALLY TWICE DAILY 45 g 0   cetirizine (ZYRTEC) 10 MG tablet Take 1 tablet (10 mg total) by mouth daily. 30 tablet 11   metoprolol succinate (TOPROL-XL) 25 MG 24 hr tablet Take 1 tablet (25 mg total) by mouth daily. 90 tablet 0   metroNIDAZOLE (METROGEL) 0.75 % vaginal gel INSERT 1 APPLICATORFUL VAGINALLY  AT BEDTIME FOR 5 DAYS 70 g 0   Multiple Vitamin (MULTIVITAMIN) tablet Take 1 tablet by mouth daily.     potassium chloride SA (KLOR-CON M) 20 MEQ tablet Take 3 tablets (60 mEq total) by  mouth 3 (three) times daily. 270 tablet 5   spironolactone (ALDACTONE) 50 MG tablet Take 1 tablet by mouth twice daily 180 tablet 0   ALPRAZolam (XANAX) 0.5 MG tablet Take 1 tablet (0.5 mg total) by mouth 3 (three) times daily as needed for anxiety. 20 tablet 0  hydrOXYzine (VISTARIL) 25 MG capsule Take 1 capsule (25 mg total) by mouth every 8 (eight) hours as needed (anxiety or insomnia). 30 capsule 2   No facility-administered medications prior to visit.    Allergies  Allergen Reactions   Amoxicillin     rash   Ace Inhibitors     REACTION: lip swelling    ROS    See HPI Objective:    Physical Exam Constitutional:      Comments: Patient appears tired  HENT:     Head: Normocephalic and atraumatic.  Neurological:     Mental Status: She is alert and oriented to person, place, and time.  Psychiatric:        Mood and Affect: Mood normal.        Behavior: Behavior normal.        Thought Content: Thought content normal.        Judgment: Judgment normal.      BP 114/85 (BP Location: Right Arm, Patient Position: Sitting, Cuff Size: Small)   Pulse 87   Temp 97.8 F (36.6 C) (Oral)   Resp 16   Wt 182 lb (82.6 kg)   SpO2 100%   BMI 31.24 kg/m  Wt Readings from Last 3 Encounters:  09/17/22 182 lb (82.6 kg)  08/04/22 172 lb (78 kg)  06/04/22 167 lb 3.2 oz (75.8 kg)       Assessment & Plan:   Problem List Items Addressed This Visit       Unprioritized   Hypokalemia - Primary    Patient reports feeling like potassium is low. Currently on potassium supplementation. -Order lab work to check current potassium levels.       Relevant Orders   Basic Metabolic Panel (BMET)   Grief reaction    Ongoing grief related to a significant loss. Patient has initiated counseling sessions. -Continue counseling sessions as scheduled.       Essential hypertension     Blood pressure well controlled at this visit. -Continue current management.      Adjustment insomnia     Hydroxyzine ineffective for sleep. Discussed options including Trazodone, Xanax, and Ambien. -Discontinue Hydroxyzine. -Start Trazodone, begin with half a pill and increase to full pill if needed.       I have discontinued Isa M. Strauser's ALPRAZolam and hydrOXYzine. I am also having her start on traZODone. Additionally, I am having her maintain her multivitamin, metoprolol succinate, cetirizine, betamethasone dipropionate, spironolactone, metroNIDAZOLE, and potassium chloride SA.  Meds ordered this encounter  Medications   traZODone (DESYREL) 50 MG tablet    Sig: Take 0.5-1 tablets (25-50 mg total) by mouth at bedtime as needed for sleep.    Dispense:  90 tablet    Refill:  0    Order Specific Question:   Supervising Provider    Answer:   Danise Edge A [[id number]]

## 2022-09-18 ENCOUNTER — Other Ambulatory Visit: Payer: BC Managed Care – PPO

## 2022-09-18 ENCOUNTER — Other Ambulatory Visit (INDEPENDENT_AMBULATORY_CARE_PROVIDER_SITE_OTHER): Payer: BC Managed Care – PPO

## 2022-09-18 DIAGNOSIS — E875 Hyperkalemia: Secondary | ICD-10-CM

## 2022-09-18 LAB — BASIC METABOLIC PANEL
BUN/Creatinine Ratio: 19 (calc) (ref 6–22)
BUN: 23 mg/dL (ref 7–25)
CO2: 30 mmol/L (ref 20–32)
Calcium: 9.1 mg/dL (ref 8.6–10.2)
Chloride: 98 mmol/L (ref 98–110)
Creat: 1.19 mg/dL — ABNORMAL HIGH (ref 0.50–0.99)
Glucose, Bld: 78 mg/dL (ref 65–99)
Potassium: 3.6 mmol/L (ref 3.5–5.3)
Sodium: 136 mmol/L (ref 135–146)

## 2022-09-19 ENCOUNTER — Telehealth: Payer: Self-pay | Admitting: Family

## 2022-09-19 DIAGNOSIS — E875 Hyperkalemia: Secondary | ICD-10-CM

## 2022-09-19 NOTE — Telephone Encounter (Signed)
Left detailed message for patient to restart kdur 3 tabs TID and call to schedule a lab visit in 1 week for repeat K+.

## 2022-10-10 DIAGNOSIS — F4323 Adjustment disorder with mixed anxiety and depressed mood: Secondary | ICD-10-CM | POA: Diagnosis not present

## 2022-10-26 ENCOUNTER — Other Ambulatory Visit: Payer: Self-pay | Admitting: Family

## 2022-10-28 ENCOUNTER — Other Ambulatory Visit: Payer: Self-pay | Admitting: Family

## 2022-10-28 DIAGNOSIS — Z1231 Encounter for screening mammogram for malignant neoplasm of breast: Secondary | ICD-10-CM

## 2022-10-30 DIAGNOSIS — F4323 Adjustment disorder with mixed anxiety and depressed mood: Secondary | ICD-10-CM | POA: Diagnosis not present

## 2022-11-10 IMAGING — MG MM DIGITAL DIAGNOSTIC UNILAT*R* W/ TOMO W/ CAD
4 series · 4 of 12 positions shown · non-contrast
Comparison: Previous exam(s).

CLINICAL DATA: Screening recall for a possible right breast mass.

EXAM:
DIGITAL DIAGNOSTIC UNILATERAL RIGHT MAMMOGRAM WITH TOMOSYNTHESIS AND
CAD; ULTRASOUND RIGHT BREAST LIMITED
TECHNIQUE: Right digital diagnostic mammography and breast tomosynthesis was
performed. The images were evaluated with computer-aided detection.;
Targeted ultrasound examination of the right breast was performed

[R MLO synth-2D]
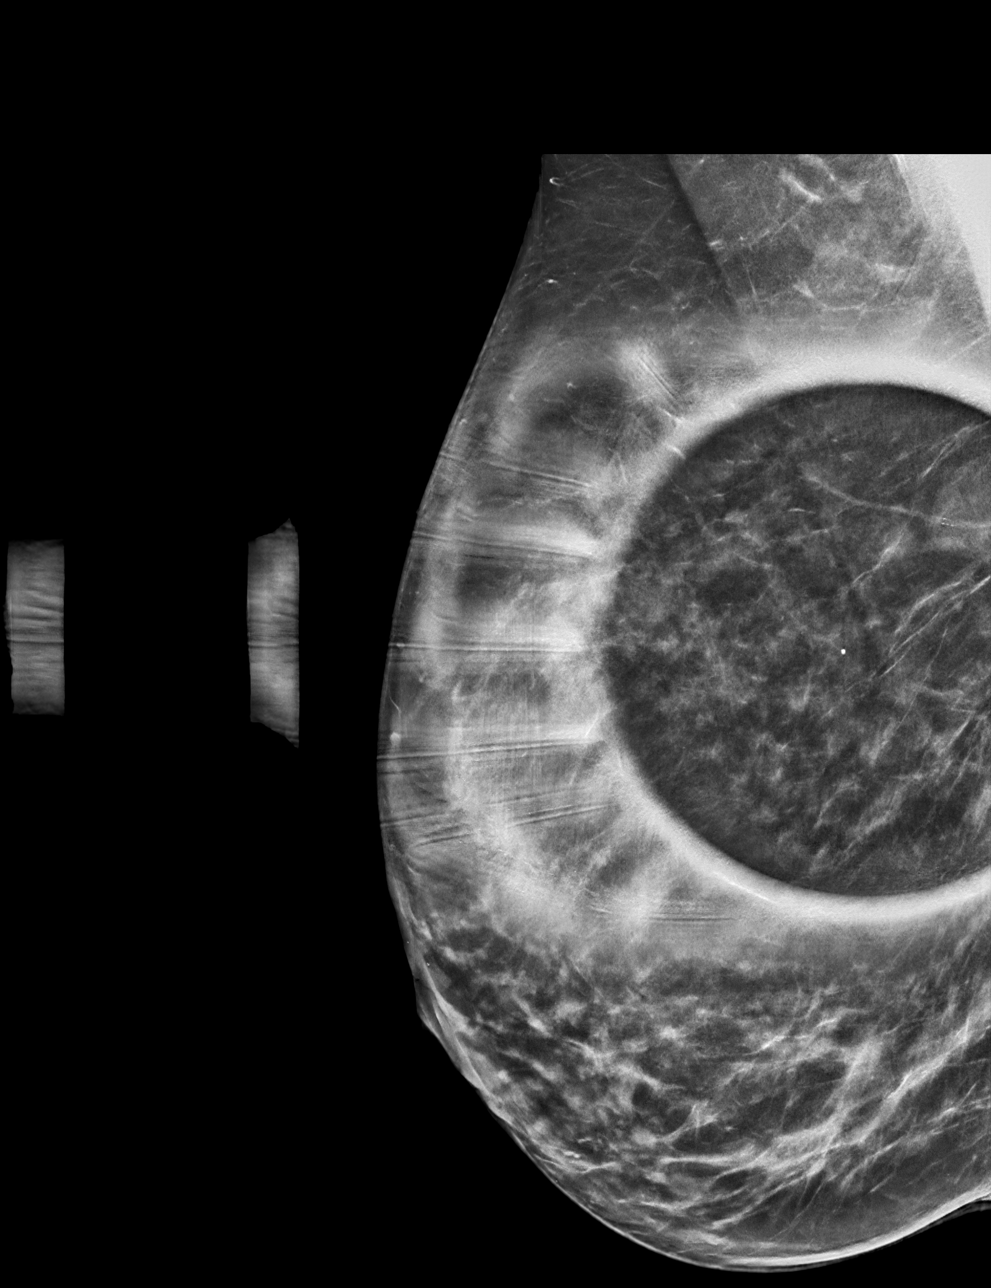

[R ML synth-2D]
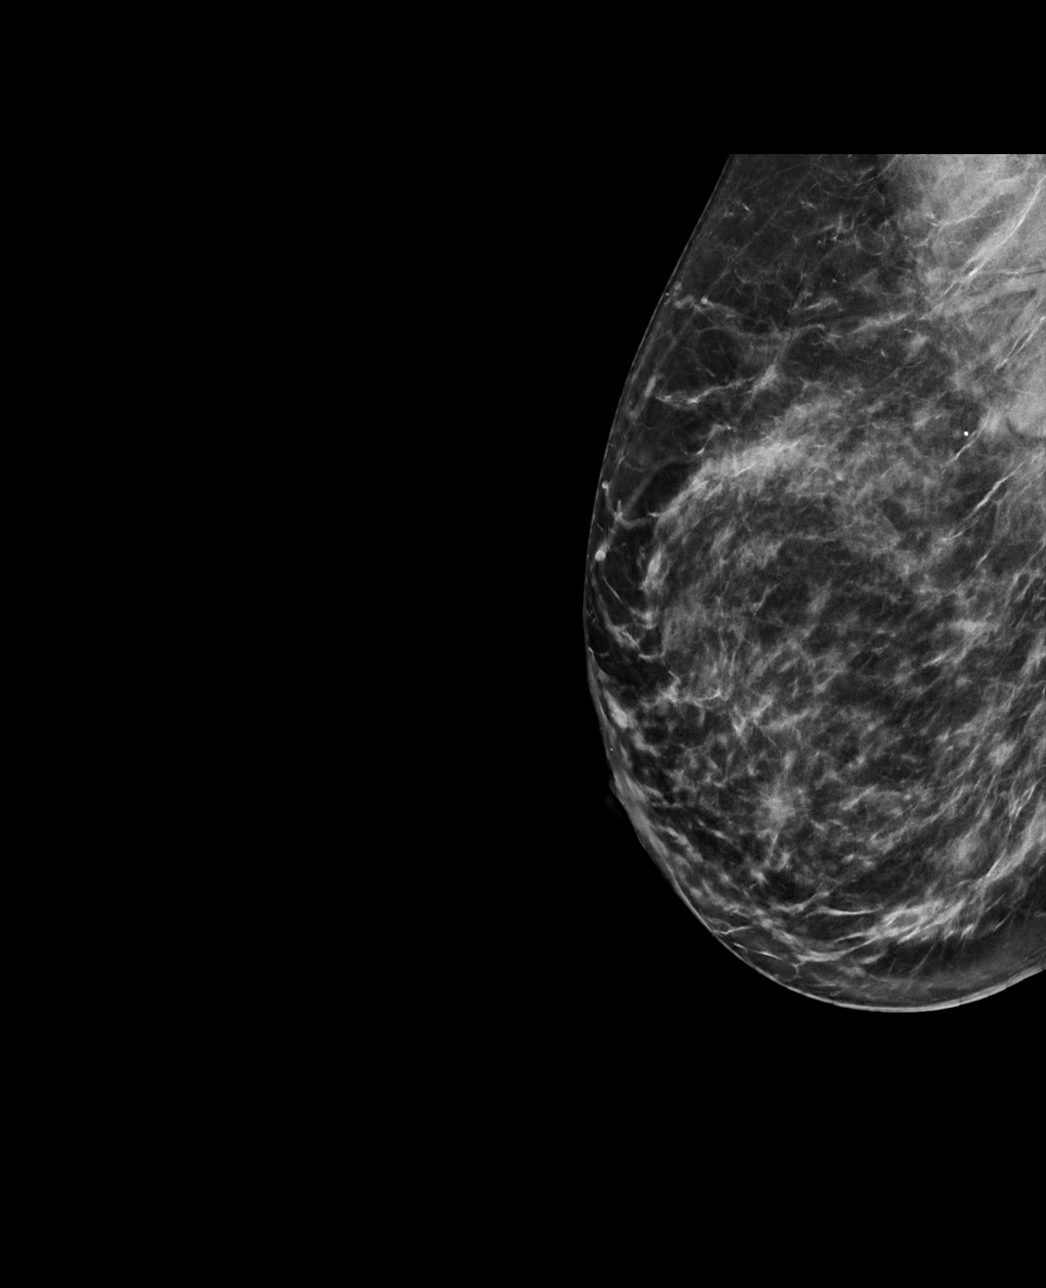

[R MLO tomo · tomo slice 35/68.0]
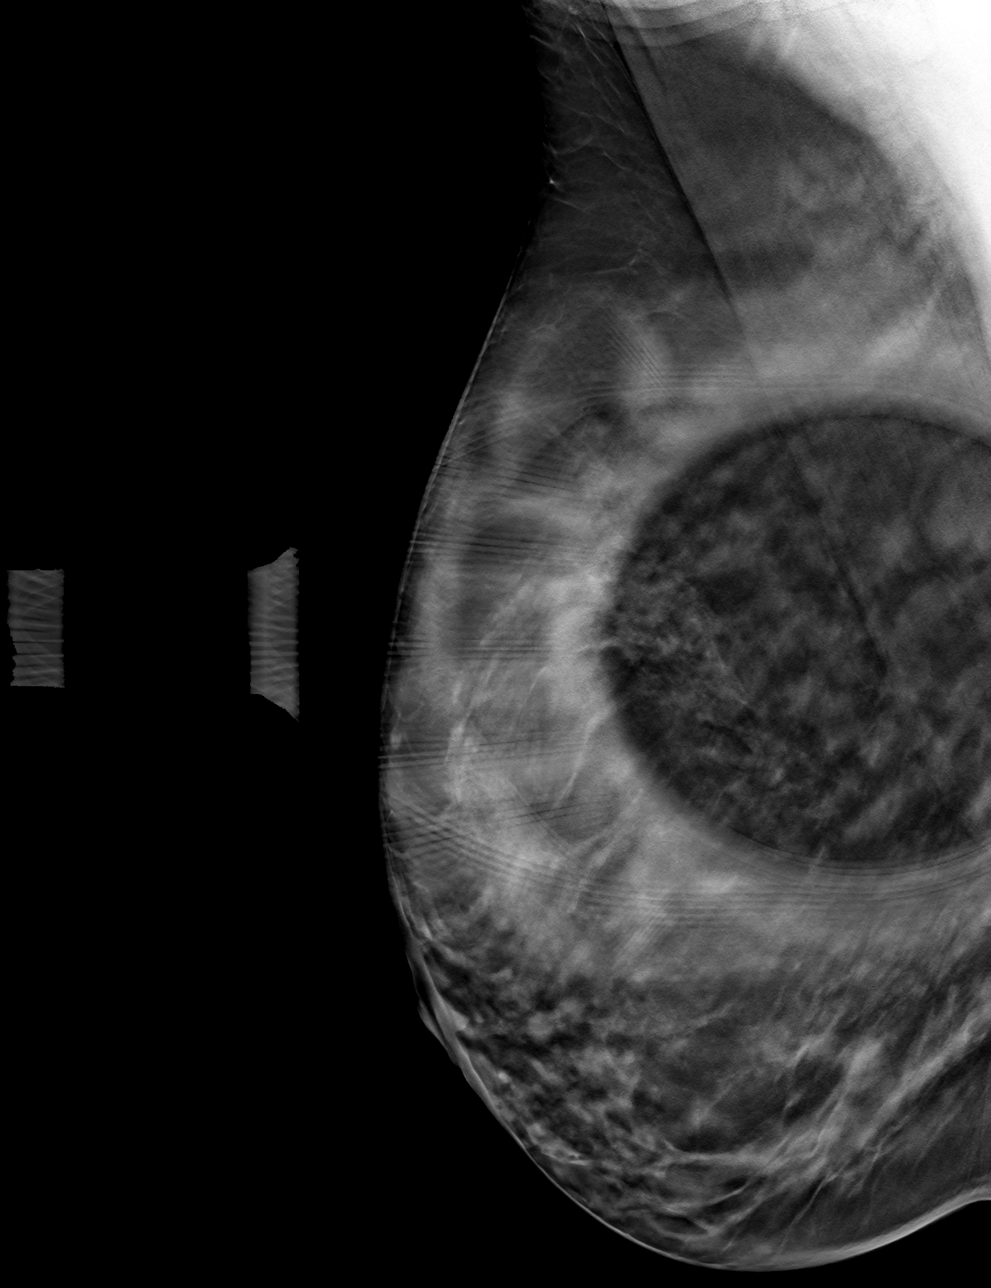

[R ML tomo · tomo slice 39/77.0]
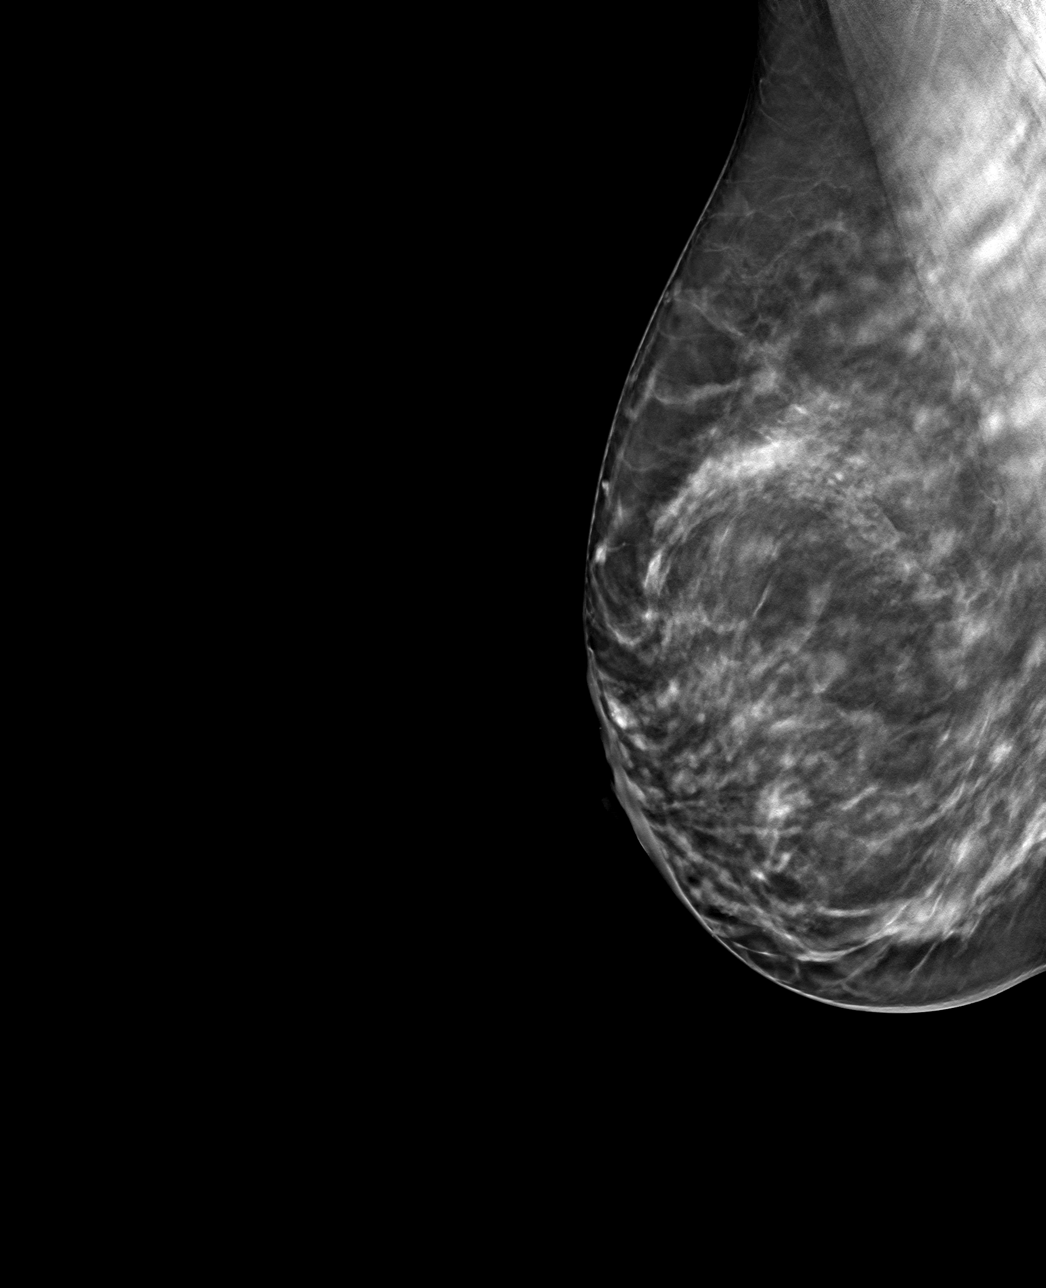

[4 of 12 positions shown; findings below may reference images not displayed]

ACR Breast Density Category c: The breast tissue is heterogeneously
dense, which may obscure small masses.
FINDINGS: Spot compression tomosynthesis images in the MLO projection
demonstrates a partially visualized round mass measuring
approximately 1.7 cm. This is not visualized on the patient's prior
mammograms, however due to the far posterior positioning may not
have been included in the field of view.

Ultrasound targeted right breast at 10 o'clock, 8 cm from the nipple
demonstrates a circumscribed oval hypoechoic mass measuring 1.9 x
0.5 x 1.8 cm. Ultrasound of the remainder of the axilla demonstrates
multiple normal-appearing lymph nodes.
IMPRESSION: 1. There is an indeterminate 1.9 cm mass in the right breast at 10
o'clock.

2.  No evidence of right axillary lymphadenopathy.

RECOMMENDATION:
Ultrasound guided biopsy is recommended for the right breast mass.
This has been scheduled for 10/05/2020 at [DATE] a.m.

I have discussed the findings and recommendations with the patient.
If applicable, a reminder letter will be sent to the patient
regarding the next appointment.

BI-RADS CATEGORY  4: Suspicious.

## 2022-11-13 ENCOUNTER — Other Ambulatory Visit (INDEPENDENT_AMBULATORY_CARE_PROVIDER_SITE_OTHER): Payer: BC Managed Care – PPO

## 2022-11-13 DIAGNOSIS — E875 Hyperkalemia: Secondary | ICD-10-CM

## 2022-11-13 LAB — BASIC METABOLIC PANEL
BUN: 24 mg/dL — ABNORMAL HIGH (ref 6–23)
CO2: 30 meq/L (ref 19–32)
Calcium: 11.4 mg/dL — ABNORMAL HIGH (ref 8.4–10.5)
Chloride: 94 meq/L — ABNORMAL LOW (ref 96–112)
Creatinine, Ser: 1.47 mg/dL — ABNORMAL HIGH (ref 0.40–1.20)
GFR: 42.8 mL/min — ABNORMAL LOW (ref >60.00)
Glucose, Bld: 97 mg/dL (ref 70–99)
Potassium: 4.2 meq/L (ref 3.5–5.1)
Sodium: 135 meq/L (ref 135–145)

## 2022-11-14 ENCOUNTER — Telehealth: Payer: Self-pay | Admitting: Family

## 2022-11-14 IMAGING — US US  BREAST BX W/ LOC DEV 1ST LESION IMG BX SPEC US GUIDE*R*
1 series · 12 of 12 positions shown · non-contrast
Comparison: Previous exam(s).
COMPARISON: Previous exam(s).

Addendum:
CLINICAL DATA: 45-year-old female presenting for ultrasound-guided
biopsy of a right breast mass.

EXAM:
ULTRASOUND GUIDED RIGHT BREAST CORE NEEDLE BIOPSY

[Series 1: us breast bx w/ loc dev 1st lesion img bx spec us  · 0.06mm/px · 12 of 12 slices shown]
[im 1/12]
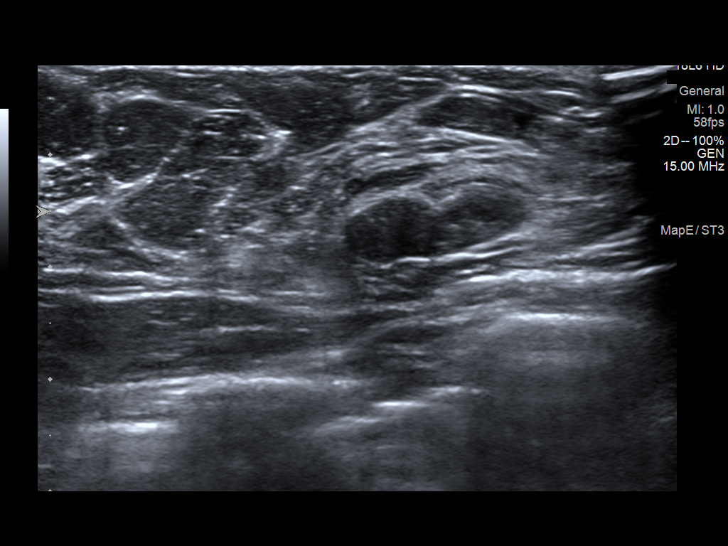
[im 2/12]
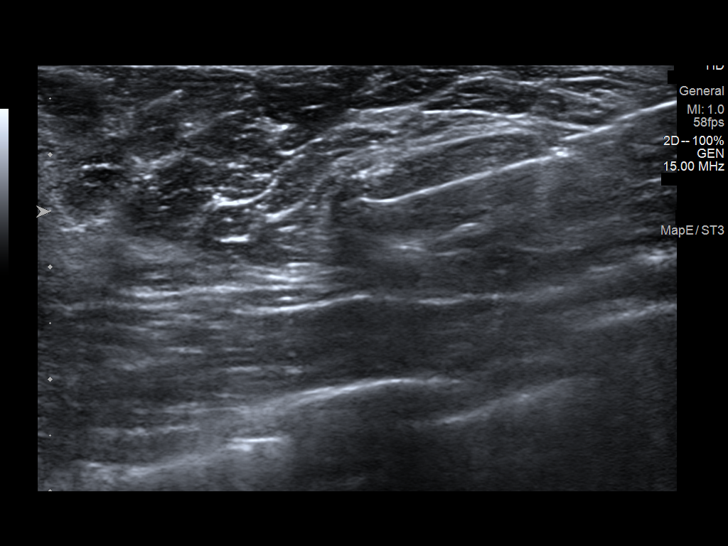
[im 3/12]
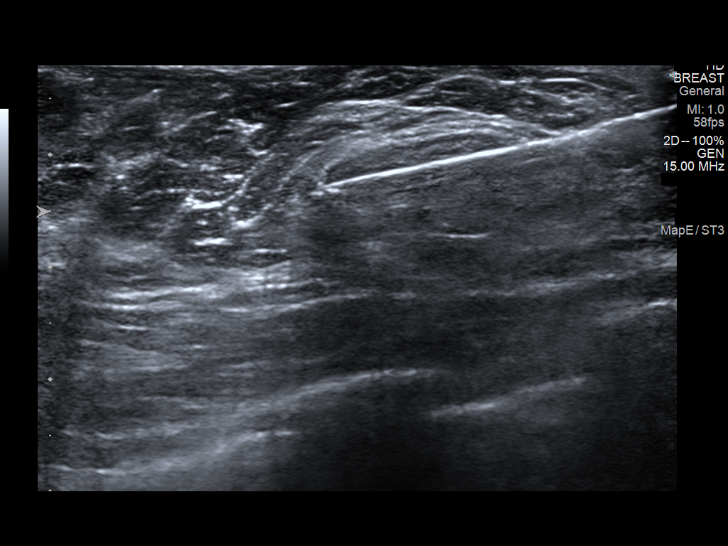
[im 4/12]
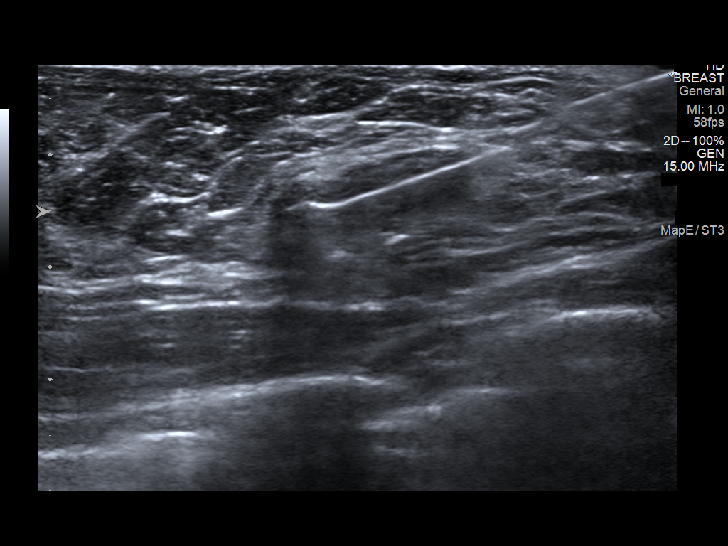
[im 5/12]
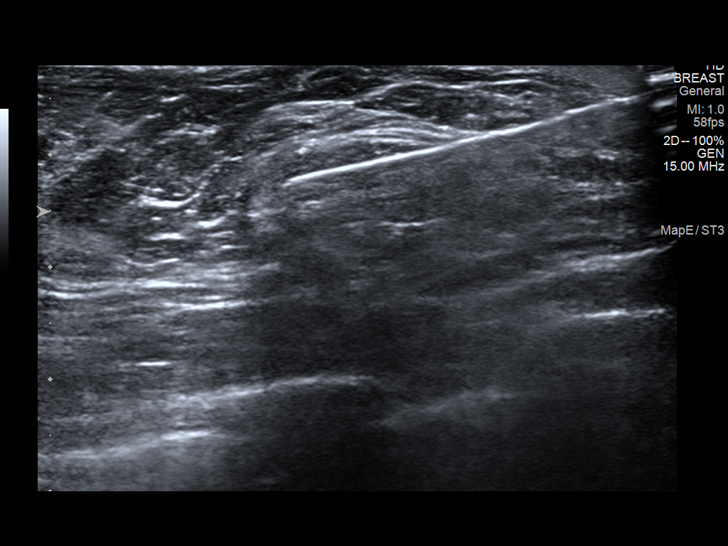
[im 6/12]
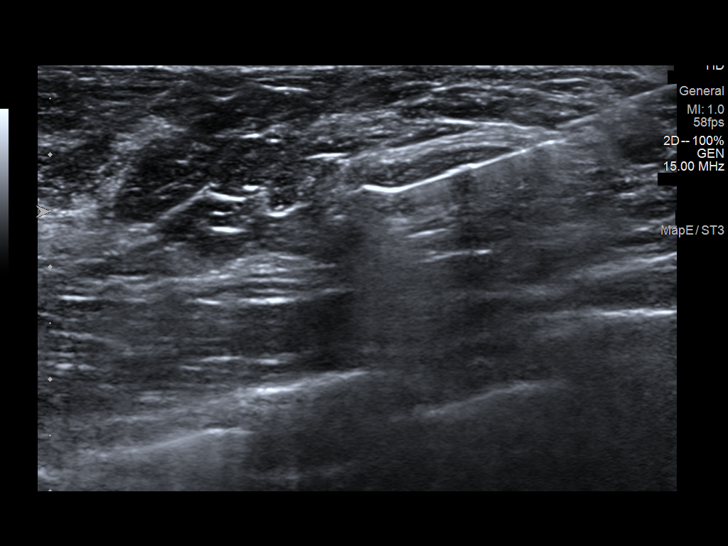
[im 7/12]
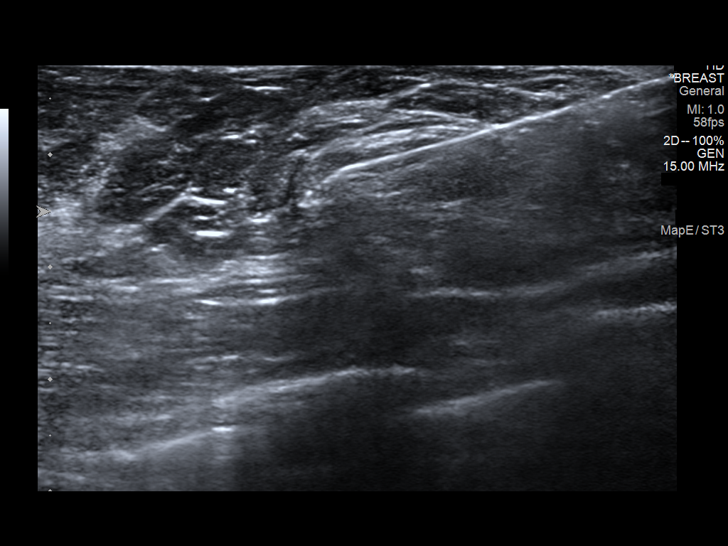
[im 8/12]
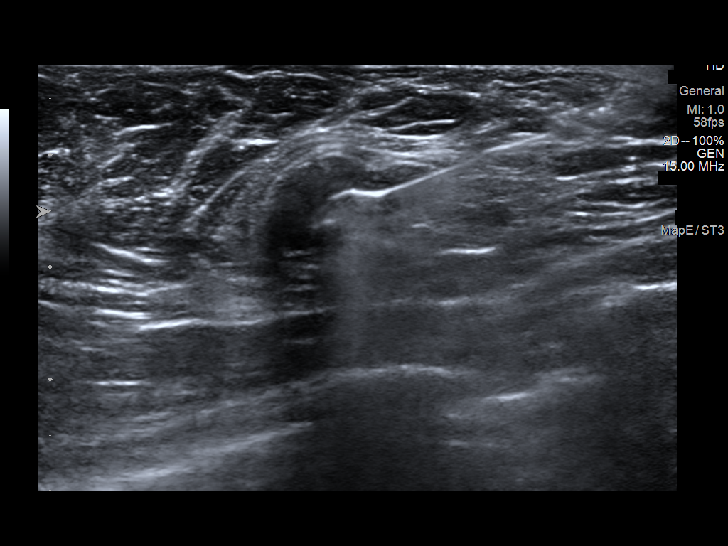
[im 9/12]
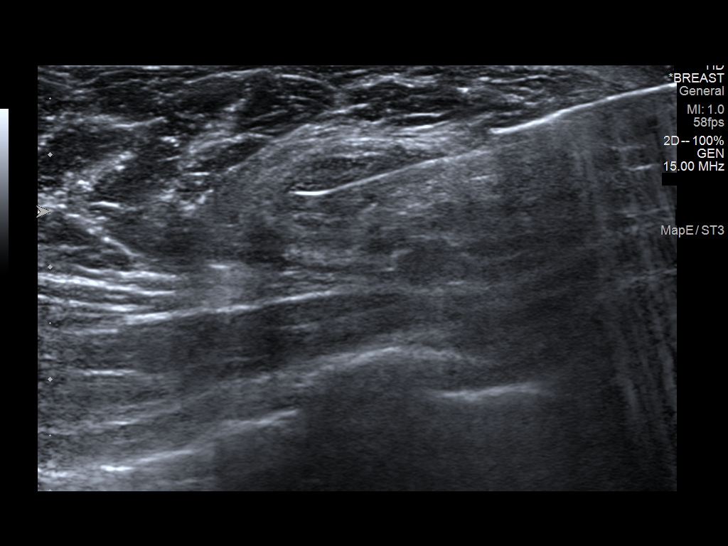
[im 10/12]
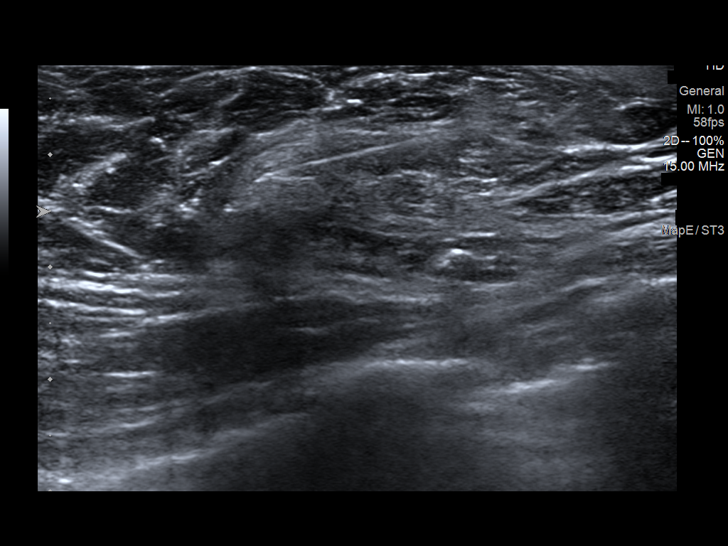
[im 11/12]
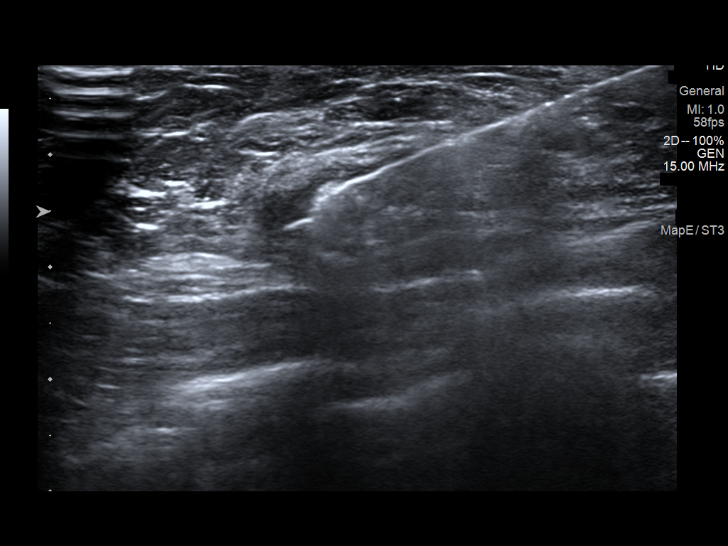
[im 12/12]
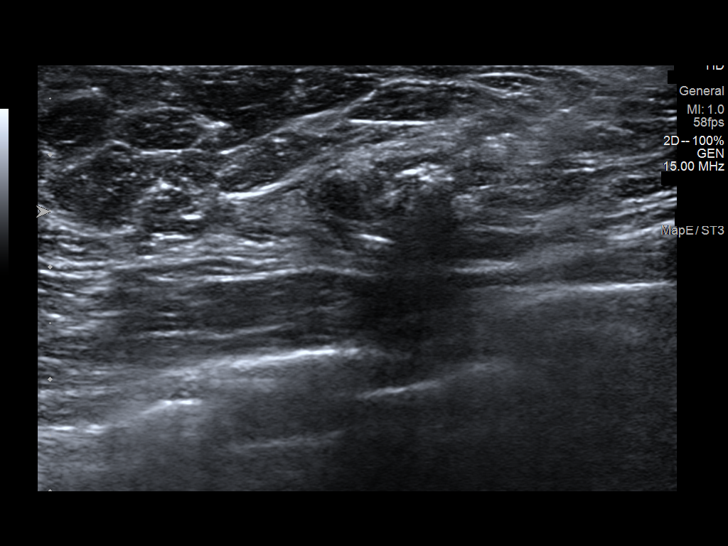

[12 of 12 positions shown; findings below may reference images not displayed]



Lesion quadrant: Upper outer quadrant

Using sterile technique and 1% Lidocaine as local anesthetic, under
direct ultrasound visualization, a 14 gauge Zack Hs device was
used to perform biopsy of a right breast mass at 10 o'clock, 8 cm
from the nipple using a lateral approach. At the conclusion of the
procedure a tribell shaped tissue marker clip was deployed into the
biopsy cavity. A post biopsy mammogram was not performed as the mass
was only partially visualized mammographically.
IMPRESSION: Ultrasound guided biopsy of a right breast mass at 10 o'clock. No
apparent complications.

ADDENDUM:
Pathology revealed FIBROADENOMA WITH USUAL DUCTAL HYPERPLASIA-
NEGATIVE FOR CARCINOMA of the RIGHT breast, 10 o'clock, 5cmfn,
tribel clip. This was found to be concordant by Dr. Minna
Nobita.

Pathology results were discussed with the patient by telephone with
Alma Delia Seng RN. The patient reported doing well after the biopsy
with tenderness at the site. Post biopsy instructions and care were
reviewed and questions were answered. The patient was encouraged to
call The [REDACTED] for any additional
concerns.

The patient was instructed to return for annual screening
mammography and informed a reminder notice would be sent regarding
this appointment.

Pathology results reported by Wee Siong Landicho RN on 10/08/2020.



Lesion quadrant: Upper outer quadrant

Using sterile technique and 1% Lidocaine as local anesthetic, under
direct ultrasound visualization, a 14 gauge Zack Hs device was
used to perform biopsy of a right breast mass at 10 o'clock, 8 cm
from the nipple using a lateral approach. At the conclusion of the
procedure a tribell shaped tissue marker clip was deployed into the
biopsy cavity. A post biopsy mammogram was not performed as the mass
was only partially visualized mammographically.
IMPRESSION: Ultrasound guided biopsy of a right breast mass at 10 o'clock. No
apparent complications.

## 2022-11-14 NOTE — Telephone Encounter (Signed)
Patient called back and notified of results and provider's recommendations. She was scheduled to return for labs next week.

## 2022-11-14 NOTE — Telephone Encounter (Signed)
Her potassium looks good, however her calcium is quite elevated and her kidney function has decreased.    I would like for her to hydrate, avoid any supplements, avoid anti-inflammatories and return for lab work in 1 week.  Also, it is important that she see the kidney doctor.  Was she ever able to get an appointment set up after last visit?  If not we should send a new referral to [facility] for CKD, hypokalemia and Hypercalcemia please.

## 2022-11-14 NOTE — Telephone Encounter (Signed)
Called patient but no answer, left voice mail for patient to call back.   

## 2022-11-20 ENCOUNTER — Other Ambulatory Visit (INDEPENDENT_AMBULATORY_CARE_PROVIDER_SITE_OTHER): Payer: BC Managed Care – PPO

## 2022-11-20 LAB — BASIC METABOLIC PANEL
BUN: 24 mg/dL — ABNORMAL HIGH (ref 6–23)
CO2: 34 meq/L — ABNORMAL HIGH (ref 19–32)
Calcium: 10.4 mg/dL (ref 8.4–10.5)
Chloride: 92 meq/L — ABNORMAL LOW (ref 96–112)
Creatinine, Ser: 1.31 mg/dL — ABNORMAL HIGH (ref 0.40–1.20)
GFR: 49.14 mL/min — ABNORMAL LOW (ref >60.00)
Glucose, Bld: 84 mg/dL (ref 70–99)
Potassium: 3.4 meq/L — ABNORMAL LOW (ref 3.5–5.1)
Sodium: 136 meq/L (ref 135–145)

## 2022-11-21 LAB — PARATHYROID HORMONE, INTACT (NO CA): PTH: 27 pg/mL (ref 16–77)

## 2022-11-26 ENCOUNTER — Other Ambulatory Visit: Payer: Self-pay

## 2022-11-26 ENCOUNTER — Other Ambulatory Visit: Payer: Self-pay | Admitting: Family

## 2022-11-26 MED ORDER — BETAMETHASONE DIPROPIONATE 0.05 % EX CREA: TOPICAL_CREAM | CUTANEOUS | 0 refills | Status: DC

## 2022-12-01 ENCOUNTER — Telehealth: Payer: Self-pay | Admitting: Family

## 2022-12-01 NOTE — Telephone Encounter (Signed)
This is for betamethasone dipropionate 0.05 % cream  Pharmacy asking for specific directions on how many grams per application. Please advise, rx sent 11/06

## 2022-12-01 NOTE — Telephone Encounter (Signed)
[facility]) called stating that in directions, it needs to specify grams used and the location where it is being applied. He stated that he can take a call in with this info.

## 2022-12-02 MED ORDER — BETAMETHASONE DIPROPIONATE 0.05 % EX CREA: TOPICAL_CREAM | CUTANEOUS | 0 refills | Status: DC

## 2022-12-02 NOTE — Addendum Note (Signed)
Addended by: Sandford Craze on: 12/02/2022 12:57 PM   Modules accepted: Orders

## 2022-12-05 ENCOUNTER — Other Ambulatory Visit: Payer: Self-pay

## 2022-12-05 ENCOUNTER — Telehealth: Payer: Self-pay | Admitting: Family

## 2022-12-05 MED ORDER — BETAMETHASONE DIPROPIONATE 0.05 % EX CREA: TOPICAL_CREAM | CUTANEOUS | 0 refills | Status: DC

## 2022-12-05 NOTE — Telephone Encounter (Signed)
Pharmacy called to request a prescription update. Margaretmary Lombard said that the "as needed" may need to be removed from the prescription so that it's as specific as possible. Margaretmary Lombard said that insurance wants the specific gram and the location where the cream should be applied which it already says but it says as needed.

## 2022-12-05 NOTE — Telephone Encounter (Signed)
Prescription sent, again, as needed comment removed

## 2022-12-08 ENCOUNTER — Other Ambulatory Visit: Payer: Self-pay | Admitting: Family

## 2022-12-10 ENCOUNTER — Ambulatory Visit: Payer: BC Managed Care – PPO | Admitting: Registered"

## 2022-12-13 ENCOUNTER — Other Ambulatory Visit: Payer: Self-pay | Admitting: Family

## 2022-12-19 ENCOUNTER — Encounter: Payer: Self-pay | Admitting: Family

## 2022-12-21 NOTE — Telephone Encounter (Signed)
Left phone message with same information  on voicemail as mychart message.

## 2023-01-26 ENCOUNTER — Ambulatory Visit: Payer: BC Managed Care – PPO

## 2023-01-26 ENCOUNTER — Encounter: Payer: Self-pay | Admitting: Podiatry

## 2023-01-28 ENCOUNTER — Other Ambulatory Visit: Payer: Self-pay | Admitting: Podiatry

## 2023-01-28 MED ORDER — DICLOFENAC SODIUM 75 MG PO TBEC: 75.0000 mg | DELAYED_RELEASE_TABLET | Freq: Two times a day (BID) | ORAL | 2 refills | Status: DC

## 2023-01-28 NOTE — Telephone Encounter (Signed)
Sent in med

## 2023-02-01 ENCOUNTER — Other Ambulatory Visit: Payer: Self-pay | Admitting: Family

## 2023-02-02 NOTE — Telephone Encounter (Signed)
 Please contact pt to schedule a follow up visit.

## 2023-02-03 NOTE — Telephone Encounter (Signed)
 Lvm 2 sch.

## 2023-02-11 ENCOUNTER — Ambulatory Visit: Payer: BC Managed Care – PPO

## 2023-02-13 ENCOUNTER — Encounter: Payer: Self-pay | Admitting: Family

## 2023-02-17 ENCOUNTER — Encounter: Payer: BC Managed Care – PPO | Admitting: Family

## 2023-02-25 ENCOUNTER — Ambulatory Visit
Admission: RE | Admit: 2023-02-25 | Discharge: 2023-02-25 | Disposition: A | Discharge status: Home / Self Care | Payer: BC Managed Care – PPO | Source: Ambulatory Visit | Attending: Family | Admitting: Family

## 2023-02-25 DIAGNOSIS — Q438 Other specified congenital malformations of intestine: Secondary | ICD-10-CM | POA: Diagnosis not present

## 2023-02-25 DIAGNOSIS — Z1231 Encounter for screening mammogram for malignant neoplasm of breast: Secondary | ICD-10-CM | POA: Diagnosis not present

## 2023-02-25 DIAGNOSIS — K648 Other hemorrhoids: Secondary | ICD-10-CM | POA: Diagnosis not present

## 2023-02-25 DIAGNOSIS — Z860100 Personal history of colon polyps, unspecified: Secondary | ICD-10-CM | POA: Diagnosis not present

## 2023-02-25 DIAGNOSIS — Z1211 Encounter for screening for malignant neoplasm of colon: Secondary | ICD-10-CM | POA: Diagnosis not present

## 2023-02-25 LAB — HM COLONOSCOPY

## 2023-03-02 ENCOUNTER — Other Ambulatory Visit: Payer: Self-pay | Admitting: Family

## 2023-03-02 ENCOUNTER — Other Ambulatory Visit: Payer: Self-pay | Admitting: Emergency Medicine

## 2023-03-02 DIAGNOSIS — R928 Other abnormal and inconclusive findings on diagnostic imaging of breast: Secondary | ICD-10-CM

## 2023-03-03 NOTE — Telephone Encounter (Signed)
Please contact pt to schedule follow up.

## 2023-03-06 ENCOUNTER — Other Ambulatory Visit: Payer: Self-pay | Admitting: Family

## 2023-03-06 DIAGNOSIS — R928 Other abnormal and inconclusive findings on diagnostic imaging of breast: Secondary | ICD-10-CM

## 2023-03-12 ENCOUNTER — Ambulatory Visit
Admission: RE | Admit: 2023-03-12 | Discharge: 2023-03-12 | Disposition: A | Discharge status: Home / Self Care | Payer: BC Managed Care – PPO | Source: Ambulatory Visit | Attending: Family | Admitting: Family

## 2023-03-12 ENCOUNTER — Ambulatory Visit
Admission: RE | Admit: 2023-03-12 | Discharge: 2023-03-12 | Disposition: A | Discharge status: Home / Self Care | Payer: BC Managed Care – PPO | Source: Ambulatory Visit | Attending: Emergency Medicine | Admitting: Emergency Medicine

## 2023-03-12 DIAGNOSIS — R928 Other abnormal and inconclusive findings on diagnostic imaging of breast: Secondary | ICD-10-CM | POA: Diagnosis not present

## 2023-03-12 DIAGNOSIS — N6323 Unspecified lump in the left breast, lower outer quadrant: Secondary | ICD-10-CM | POA: Diagnosis not present

## 2023-03-14 ENCOUNTER — Other Ambulatory Visit: Payer: Self-pay | Admitting: Family

## 2023-03-14 DIAGNOSIS — N6489 Other specified disorders of breast: Secondary | ICD-10-CM

## 2023-03-14 DIAGNOSIS — N6320 Unspecified lump in the left breast, unspecified quadrant: Secondary | ICD-10-CM

## 2023-03-27 ENCOUNTER — Ambulatory Visit: Payer: BC Managed Care – PPO | Admitting: Family

## 2023-04-07 ENCOUNTER — Other Ambulatory Visit: Payer: Self-pay | Admitting: Family

## 2023-04-07 NOTE — Telephone Encounter (Signed)
 Please contact pt to schedule a follow up appointment.

## 2023-04-08 ENCOUNTER — Ambulatory Visit: Payer: BC Managed Care – PPO | Admitting: Family

## 2023-04-08 NOTE — Telephone Encounter (Signed)
 Lvm 2 sch.

## 2023-05-06 ENCOUNTER — Other Ambulatory Visit: Payer: Self-pay | Admitting: Family

## 2023-05-06 NOTE — Telephone Encounter (Signed)
 Please contact pt to schedule a follow up visit.

## 2023-05-06 NOTE — Telephone Encounter (Signed)
Lvm 2 schedule.

## 2023-05-29 ENCOUNTER — Ambulatory Visit: Admitting: Family

## 2023-05-29 ENCOUNTER — Encounter: Payer: Self-pay | Admitting: Family

## 2023-05-29 VITALS — BP 122/89 | HR 70 | Ht 64.0 in | Wt 162.2 lb

## 2023-05-29 DIAGNOSIS — N183 Chronic kidney disease, stage 3 unspecified: Secondary | ICD-10-CM

## 2023-05-29 DIAGNOSIS — F419 Anxiety disorder, unspecified: Secondary | ICD-10-CM

## 2023-05-29 DIAGNOSIS — J3089 Other allergic rhinitis: Secondary | ICD-10-CM

## 2023-05-29 DIAGNOSIS — I10 Essential (primary) hypertension: Secondary | ICD-10-CM

## 2023-05-29 DIAGNOSIS — F32A Depression, unspecified: Secondary | ICD-10-CM | POA: Diagnosis not present

## 2023-05-29 DIAGNOSIS — R5383 Other fatigue: Secondary | ICD-10-CM

## 2023-05-29 DIAGNOSIS — Z23 Encounter for immunization: Secondary | ICD-10-CM

## 2023-05-29 LAB — COMPREHENSIVE METABOLIC PANEL WITH GFR
AG Ratio: 1.5 (calc) (ref 1.0–2.5)
ALT: 13 U/L (ref 6–29)
AST: 16 U/L (ref 10–35)
Albumin: 4.6 g/dL (ref 3.6–5.1)
Alkaline phosphatase (APISO): 73 U/L (ref 31–125)
BUN/Creatinine Ratio: 16 (calc) (ref 6–22)
BUN: 17 mg/dL (ref 7–25)
CO2: 29 mmol/L (ref 20–32)
Calcium: 10 mg/dL (ref 8.6–10.2)
Chloride: 99 mmol/L (ref 98–110)
Creat: 1.06 mg/dL — ABNORMAL HIGH (ref 0.50–0.99)
Globulin: 3 g/dL (ref 1.9–3.7)
Glucose, Bld: 78 mg/dL (ref 65–99)
Potassium: 3.3 mmol/L — ABNORMAL LOW (ref 3.5–5.3)
Sodium: 138 mmol/L (ref 135–146)
Total Bilirubin: 0.3 mg/dL (ref 0.2–1.2)
Total Protein: 7.6 g/dL (ref 6.1–8.1)
eGFR: 66 mL/min/{1.73_m2} (ref >=60)

## 2023-05-29 LAB — CBC WITH DIFFERENTIAL/PLATELET
Absolute Lymphocytes: 2918 {cells}/uL (ref 850–3900)
Absolute Monocytes: 371 {cells}/uL (ref 200–950)
Basophils Absolute: 38 {cells}/uL (ref 0–200)
Basophils Relative: 0.6 %
Eosinophils Absolute: 58 {cells}/uL (ref 15–500)
Eosinophils Relative: 0.9 %
HCT: 34.6 % — ABNORMAL LOW (ref 35.0–45.0)
Hemoglobin: 11.5 g/dL — ABNORMAL LOW (ref 11.7–15.5)
MCH: 30.2 pg (ref 27.0–33.0)
MCHC: 33.2 g/dL (ref 32.0–36.0)
MCV: 90.8 fL (ref 80.0–100.0)
MPV: 10.3 fL (ref 7.5–12.5)
Monocytes Relative: 5.8 %
Neutro Abs: 3014 {cells}/uL (ref 1500–7800)
Neutrophils Relative %: 47.1 %
Platelets: 375 10*3/uL (ref 140–400)
RBC: 3.81 10*6/uL (ref 3.80–5.10)
RDW: 12.3 % (ref 11.0–15.0)
Total Lymphocyte: 45.6 %
WBC: 6.4 10*3/uL (ref 3.8–10.8)

## 2023-05-29 LAB — MAGNESIUM: Magnesium: 2.1 mg/dL (ref 1.5–2.5)

## 2023-05-29 LAB — TSH: TSH: 0.73 m[IU]/L

## 2023-05-29 MED ORDER — BUPROPION HCL ER (XL) 150 MG PO TB24
150.0000 mg | ORAL_TABLET | Freq: Every day | ORAL | 0 refills | Status: DC
Start: 2023-05-29 — End: 2023-08-18

## 2023-05-29 MED ORDER — AMLODIPINE BESYLATE 2.5 MG PO TABS: 2.5000 mg | ORAL_TABLET | Freq: Every day | ORAL | 0 refills | Status: DC

## 2023-05-29 NOTE — Assessment & Plan Note (Signed)
 Has not seen nephrology in some time. Pt advised to schedule a follow up.

## 2023-05-29 NOTE — Assessment & Plan Note (Signed)
 Depression is uncontrolled. Will add wellbutrin  xl once daily.

## 2023-05-29 NOTE — Progress Notes (Signed)
 Subjective:     Patient ID: Nicole Quinn, female    DOB: 10-Jun-1975, 48 y.o.   MRN: [id number]  Chief Complaint  Patient presents with   Medical Management of Chronic Issues    Patient  presents for a 8 month follow-up   Quality Metric Gaps    Pap,TDAP, pneumococcal    HPI  Discussed the use of AI scribe software for clinical note transcription with the patient, who gave verbal consent to proceed.  History of Present Illness   Nicole Quinn is a 48 year old female who presents with fatigue and excessive sleepiness.  She experiences significant fatigue and excessive sleepiness, often sleeping when not working. She feels depressed, cries frequently, and lacks motivation. She is not receiving counseling due to scheduling conflicts. She is still waiting on state to perform autopsy on her son who died nearly 1 year ago.  This is a stressor for her.  She is on metoprolol  for hypertension with a recent blood pressure of 122/89 mmHg. She takes Aldactone  for acne and Zyrtec  for allergies, both well-controlled. She takes potassium supplements but has difficulty swallowing them.  She has lost 20 pounds, attributing this to attempts at eating well and working out, though she admits to not always eating enough.  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 Staying busy helps her avoid feeling worse, as inactivity worsens her mood.    Health Maintenance Due  Topic Date Due   Pneumococcal Vaccine 1-72 Years old (1 of 2 - PCV) Never done    Past Medical History:  Diagnosis Date   Acute renal failure (ARF) (HCC) 10/26/2020   Allergy    Anemia    CKD (chronic kidney disease) stage 3, GFR 30-59 ml/min (HCC) 04/09/2021   Endometriosis    History of echocardiogram    Echo 6/18: EF 60-65, no RWMA, normal diastolic function   History of nuclear stress test    Myoview  6/18: EF 60, normal perfusion, Low Risk   History of UTI     Hypertension    pregnancy induced HTN/pre-eclampsia //  on meds since 2nd child (2000)    Past Surgical History:  Procedure Laterality Date   BREAST BIOPSY Left 10/01/2017   BREAST BIOPSY Right    CARPAL TUNNEL RELEASE     right   GANGLION CYST EXCISION     right   Hammer Toe Repair Bilateral 12/29/2013   Lt #3, #4, Rt #2.   TOTAL ABDOMINAL HYSTERECTOMY  2003   ovaries intat    Family History  Problem Relation Age of Onset   Allergies Daughter    Allergic rhinitis Daughter    Allergies Son    Allergic rhinitis Son    Leukemia Paternal Grandmother    Arthritis Mother    Hyperlipidemia Mother    Hypertension Mother    Stroke Mother    Allergic rhinitis Mother    Hypertension Father    Diabetes Father    Arthritis Maternal Grandmother    Stroke Maternal Grandmother 50   Hypertension Maternal Grandmother    Heart attack Maternal Grandmother 28   Coronary artery disease Maternal Aunt 50   Coronary artery disease Maternal Aunt 50   Angioedema Neg Hx    Asthma Neg Hx    Eczema Neg Hx    Immunodeficiency Neg Hx    Urticaria Neg Hx     Social History   Socioeconomic History   Marital  status: Married    Spouse name: Not on file   Number of children: 2   Years of education: Not on file   Highest education level: Some college, no degree  Occupational History    Employer: [employer]    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    Married.     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      Social Drivers of Health   Financial Resource Strain: Low Risk  (05/28/2023)   Overall  Financial Resource Strain (CARDIA)    Difficulty of Paying Living Expenses: Not hard at all  Food Insecurity: No Food Insecurity (05/28/2023)   Hunger Vital Sign    Worried About Running Out of Food in the Last Year: Never true    Ran Out of Food in the Last Year: Never true  Transportation Needs: No Transportation Needs (05/28/2023)   PRAPARE - Administrator, Civil Service (Medical): No    Lack of Transportation (Non-Medical): No  Physical Activity: Insufficiently Active (05/28/2023)   Exercise Vital Sign    Days of Exercise per Week: 3 days    Minutes of Exercise per Session: 30 min  Stress: Stress Concern Present (05/28/2023)   [employer] of Occupational Health - Occupational Stress Questionnaire    Feeling of Stress : Very much  Social Connections: Socially Integrated (05/28/2023)   Social Connection and Isolation Panel [NHANES]    Frequency of Communication with Friends and Family: More than three times a week    Frequency of Social Gatherings with Friends and Family: More than three times a week    Attends Religious Services: More than 4 times per year    Active Member of [employer] or Organizations: No    Attends Engineer, structural: 1 to 4 times per year    Marital Status: Married  Catering manager Violence: Unknown (02/28/2022)   Received from [facility], [facility]   HITS    Physically Hurt: Not on file    Insult or Talk Down To: Not on file    Threaten Physical Harm: Not on file    Scream or Curse: Not on file    Outpatient Medications Prior to Visit  Medication Sig Dispense Refill   betamethasone  dipropionate 0.05 % cream APPLY 0.25 GRAMS TO AFFECTED AREA ON LEGS TWICE DAILY 45 g 0   cetirizine  (ZYRTEC ) 10 MG tablet Take 1 tablet (10 mg total) by mouth daily. 30 tablet 11   diclofenac  (VOLTAREN ) 75 MG EC tablet Take 1 tablet (75 mg total) by mouth 2 (two) times daily. 50 tablet 2   metroNIDAZOLE  (METROGEL ) 0.75 % vaginal gel INSERT 1 APPLICATORAFUL  VAGINALLY AT BEDTIME FOR 5 DAYS 70 g 0   Multiple Vitamin (MULTIVITAMIN) tablet Take 1 tablet by mouth daily.     potassium chloride  SA (KLOR-CON  M) 20 MEQ tablet Take 3 tablets (60 mEq total) by mouth 3 (three) times daily. 270 tablet 5   spironolactone  (ALDACTONE ) 50 MG tablet Take 1 tablet by mouth  twice daily 60 tablet 0   traZODone  (DESYREL ) 50 MG tablet TAKE 1/2 TO 1 (ONE-HALF TO ONE) TABLET BY MOUTH AT BEDTIME AS NEEDED FOR SLEEP 90 tablet 0   metoprolol  succinate (TOPROL -XL) 25 MG 24 hr tablet Take 1 tablet (25 mg total) by mouth daily. 90 tablet 0   No facility-administered medications prior to visit.    Allergies  Allergen Reactions   Amoxicillin      rash   Ace Inhibitors     REACTION: lip swelling   Shrimp Extract     ROS See HPI    Objective:     Physical Exam Constitutional:      General: She is not in acute distress.    Appearance: Normal appearance. She is well-developed.  HENT:     Head: Normocephalic and atraumatic.     Right Ear: External ear normal.     Left Ear: External ear normal.  Eyes:     General: No scleral icterus. Neck:     Thyroid : No thyromegaly.  Cardiovascular:     Rate and Rhythm: Normal rate and regular rhythm.     Heart sounds: Normal heart sounds. No murmur heard. Pulmonary:     Effort: Pulmonary effort is normal. No respiratory distress.     Breath sounds: Normal breath sounds. No wheezing.  Musculoskeletal:     Cervical back: Neck supple.  Skin:    General: Skin is warm and dry.  Neurological:     Mental Status: She is alert and oriented to person, place, and time.  Psychiatric:        Mood and Affect: Mood normal.        Behavior: Behavior normal.        Thought Content: Thought content normal.        Judgment: Judgment normal.      BP 122/89   Pulse 70   Ht 5\' 4"  (1.626 m)   Wt 162 lb 3.2 oz (73.6 kg)   SpO2 99%   BMI 27.84 kg/m  Wt Readings from Last 3 Encounters:  05/29/23 162 lb 3.2 oz (73.6 kg)  09/17/22  182 lb (82.6 kg)  08/04/22 172 lb (78 kg)       Assessment & Plan:   Problem List Items Addressed This Visit       Unprioritized   Seasonal and perennial allergic rhinitis   Stable on zyrtec . Continue same.      Essential hypertension   BP stable.  Change metoprolol  to amlodipine  due to potential interaction with wellbutrin . Continue aldactone .      Relevant Medications   amLODipine  (NORVASC ) 2.5 MG tablet   Other Relevant Orders   Comp Met (CMET)   CKD (chronic kidney disease) stage 3, GFR 30-59 ml/min (HCC)   Has not seen nephrology in some time. Pt advised to schedule a follow up.      Anxiety and depression   Depression is uncontrolled. Will add wellbutrin  xl once daily.       Relevant Medications   buPROPion  (WELLBUTRIN  XL) 150 MG 24 hr tablet   Other Visit Diagnoses       Depression, unspecified depression type    -  Primary   Relevant Medications   buPROPion  (WELLBUTRIN  XL) 150 MG 24 hr tablet     Fatigue, unspecified type       Relevant Orders   CBC with Differential/Platelet   TSH   Magnesium      Need for diphtheria-tetanus-pertussis (Tdap) vaccine  Relevant Orders   Tdap vaccine greater than or equal to 7yo IM (Completed)       I have discontinued Mettie M. Beedy's metoprolol  succinate. I am also having her start on amLODipine  and buPROPion . Additionally, I am having her maintain her multivitamin, cetirizine , potassium chloride  SA, metroNIDAZOLE , traZODone , diclofenac , betamethasone  dipropionate, and spironolactone .  Meds ordered this encounter  Medications   amLODipine  (NORVASC ) 2.5 MG tablet    Sig: Take 1 tablet (2.5 mg total) by mouth daily.    Dispense:  90 tablet    Refill:  0    Supervising Provider:   Randie Bustle A [[id number]]   buPROPion  (WELLBUTRIN  XL) 150 MG 24 hr tablet    Sig: Take 1 tablet (150 mg total) by mouth daily.    Dispense:  90 tablet    Refill:  0    Supervising Provider:   Randie Bustle A [[id number]]

## 2023-05-29 NOTE — Assessment & Plan Note (Signed)
 BP stable.  Change metoprolol  to amlodipine  due to potential interaction with wellbutrin . Continue aldactone .

## 2023-05-29 NOTE — Assessment & Plan Note (Signed)
Stable on zyrtec.  Continue same.  

## 2023-05-29 NOTE — Addendum Note (Signed)
 Addended by: Susa Engman A on: 05/29/2023 02:17 PM   Modules accepted: Orders

## 2023-05-29 NOTE — Patient Instructions (Signed)
 VISIT SUMMARY:  Today, we addressed your fatigue, excessive sleepiness, and feelings of depression. We also reviewed your hypertension management, potassium deficiency, and general health maintenance needs.  YOUR PLAN:  HYPERTENSION: Your blood pressure is currently well-controlled at 122/89 mmHg, but we need to switch your medication due to an interaction with Wellbutrin . -Stop taking metoprolol . -Start taking amlodipine  2.5 mg daily. -Monitor your blood pressure at home and report the readings after a few days. -Follow up in six weeks.  DEPRESSION: You are experiencing symptoms of depression, including excessive sleeping and crying. Wellbutrin  has been chosen to help with these symptoms. -Start taking Wellbutrin  in the morning. -Try to resume counseling as soon as your schedule allows.  POTASSIUM DEFICIENCY: You have a persistent potassium deficiency and difficulty swallowing the tablets. -Continue your current potassium supplements. -We will check your potassium levels.  GENERAL HEALTH MAINTENANCE: You are due for a tetanus immunization and other routine vaccines. -You will receive a tetanus vaccine today. -Plan to get your flu shot and COVID booster in the fall.

## 2023-06-01 ENCOUNTER — Encounter: Payer: Self-pay | Admitting: Family

## 2023-06-02 ENCOUNTER — Ambulatory Visit (INDEPENDENT_AMBULATORY_CARE_PROVIDER_SITE_OTHER): Admitting: Family

## 2023-06-02 VITALS — BP 120/85 | HR 75 | Temp 98.4°F | Resp 16 | Ht 64.0 in | Wt 164.0 lb

## 2023-06-02 DIAGNOSIS — N766 Ulceration of vulva: Secondary | ICD-10-CM | POA: Insufficient documentation

## 2023-06-02 MED ORDER — VALACYCLOVIR HCL 1 G PO TABS: 1000.0000 mg | ORAL_TABLET | Freq: Two times a day (BID) | ORAL | 0 refills | Status: DC

## 2023-06-02 NOTE — Progress Notes (Signed)
 Subjective:     Patient ID: Nicole Quinn, female    DOB: [birth year], 48 y.o.   MRN: [id number]  Chief Complaint  Patient presents with   Vaginal Pain    Patient complains of vaginal pain since Saturday.     Vaginal Pain    Discussed the use of AI scribe software for clinical note transcription with the patient, who gave verbal consent to proceed.  History of Present Illness  Nicole Quinn is a 48 year old female who presents with vaginal pain.  She has experienced vaginal pain since Saturday, located internally "towards the top", and describes it as "very painful". She denies intercourse the night before the pain began. There is no abnormal discharge or itching. She denies any previous hx of vaginal rashes/ulcers or HSV.     Health Maintenance Due  Topic Date Due   Pneumococcal Vaccine 45-63 Years old (1 of 2 - PCV) Never done    Past Medical History:  Diagnosis Date   Acute renal failure (ARF) (HCC) 10/26/2020   Allergy    Anemia    CKD (chronic kidney disease) stage 3, GFR 30-59 ml/min (HCC) 04/09/2021   Endometriosis    History of echocardiogram    Echo 6/18: EF 60-65, no RWMA, normal diastolic function   History of nuclear stress test    Myoview  6/18: EF 60, normal perfusion, Low Risk   History of UTI    Hypertension    pregnancy induced HTN/pre-eclampsia //  on meds since 2nd child (2000)    Past Surgical History:  Procedure Laterality Date   BREAST BIOPSY Left 10/01/2017   BREAST BIOPSY Right    CARPAL TUNNEL RELEASE     right   GANGLION CYST EXCISION     right   Hammer Toe Repair Bilateral 12/29/2013   Lt #3, #4, Rt #2.   TOTAL ABDOMINAL HYSTERECTOMY  2003   ovaries intat    Family History  Problem Relation Age of Onset   Allergies Daughter    Allergic rhinitis Daughter    Allergies Son    Allergic rhinitis Son    Leukemia Paternal Grandmother    Arthritis Mother    Hyperlipidemia Mother    Hypertension Mother    Stroke Mother     Allergic rhinitis Mother    Hypertension Father    Diabetes Father    Arthritis Maternal Grandmother    Stroke Maternal Grandmother 50   Hypertension Maternal Grandmother    Heart attack Maternal Grandmother 28   Coronary artery disease Maternal Aunt 50   Coronary artery disease Maternal Aunt 50   Angioedema Neg Hx    Asthma Neg Hx    Eczema Neg Hx    Immunodeficiency Neg Hx    Urticaria Neg Hx     Social History   Socioeconomic History   Marital status: Married    Spouse name: Not on file   Number of children: 2   Years of education: Not on file   Highest education level: Some college, no degree  Occupational History    Employer: [employer]    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    Married.     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      Social Drivers of Health   Financial Resource Strain: Low Risk  (05/28/2023)   Overall Financial Resource Strain (CARDIA)    Difficulty of Paying Living Expenses: Not hard at all  Food Insecurity: No Food Insecurity (05/28/2023)   Hunger Vital Sign    Worried About Running Out of Food in the Last Year: Never true    Ran Out of Food in the Last Year: Never true  Transportation Needs: No Transportation Needs (05/28/2023)   PRAPARE - Administrator, Civil Service (Medical): No    Lack of Transportation (Non-Medical): No  Physical Activity: Insufficiently Active (05/28/2023)   Exercise Vital Sign    Days of Exercise per Week: 3 days    Minutes of Exercise per Session: 30 min  Stress: Stress Concern Present (05/28/2023)   [employer] of Occupational Health - Occupational Stress Questionnaire    Feeling of Stress : Very much  Social Connections: Socially Integrated (05/28/2023)   Social Connection and Isolation Panel [NHANES]    Frequency of Communication with Friends and Family: More than three times a week    Frequency of Social Gatherings with Friends and Family: More than three times a week    Attends Religious Services: More than 4 times per year    Active Member of [employer] or Organizations: No    Attends Engineer, structural: 1 to 4 times per year    Marital Status: Married  Catering manager Violence: Unknown (02/28/2022)   Received from [facility], [facility]   HITS    Physically Hurt: Not on file    Insult or Talk Down To: Not on file    Threaten Physical Harm: Not on file    Scream or Curse: Not on file    Outpatient Medications Prior to Visit  Medication Sig Dispense Refill   amLODipine  (NORVASC ) 2.5 MG tablet Take 1 tablet (2.5 mg total) by mouth daily. 90 tablet 0   betamethasone  dipropionate 0.05 % cream APPLY 0.25 GRAMS TO AFFECTED AREA ON LEGS TWICE DAILY 45 g 0   buPROPion  (WELLBUTRIN  XL) 150 MG 24 hr tablet Take 1 tablet (150 mg total) by mouth daily. 90 tablet 0   cetirizine  (ZYRTEC ) 10 MG tablet Take 1 tablet (10 mg total) by mouth daily. 30 tablet 11   diclofenac  (VOLTAREN ) 75 MG EC tablet Take 1 tablet (75 mg total) by mouth 2 (two) times daily. 50 tablet 2   metroNIDAZOLE  (METROGEL ) 0.75 % vaginal gel INSERT 1 APPLICATORAFUL VAGINALLY AT BEDTIME FOR 5 DAYS 70 g 0   Multiple Vitamin (MULTIVITAMIN) tablet Take 1 tablet by mouth daily.     potassium chloride  SA (KLOR-CON  M) 20 MEQ tablet Take 3 tablets (60 mEq total) by mouth 3 (three) times daily. 270 tablet 5   spironolactone  (ALDACTONE ) 50 MG tablet Take 1 tablet by mouth twice daily 60 tablet 0   traZODone  (DESYREL ) 50 MG tablet TAKE 1/2 TO 1 (ONE-HALF TO ONE) TABLET BY MOUTH AT BEDTIME AS NEEDED FOR SLEEP 90 tablet 0   No  facility-administered medications prior to visit.    Allergies  Allergen Reactions   Amoxicillin      rash   Ace Inhibitors     REACTION: lip swelling   Shrimp Extract  Review of Systems  Genitourinary:  Positive for vaginal pain.       Objective:     Physical Exam Exam conducted with a chaperone present.  Constitutional:      Appearance: Normal appearance.  Cardiovascular:     Rate and Rhythm: Normal rate.  Pulmonary:     Effort: Pulmonary effort is normal.  Genitourinary:    Comments: Several shallow tender ulcerated lesions noted above the introitus Skin:    General: Skin is warm and dry.  Neurological:     Mental Status: She is alert.      BP 120/85 (BP Location: Right Arm, Patient Position: Sitting, Cuff Size: Normal)   Pulse 75   Temp 98.4 F (36.9 C) (Oral)   Resp 16   Ht 5\' 4"  (1.626 m)   Wt 164 lb (74.4 kg)   SpO2 100%   BMI 28.15 kg/m  Wt Readings from Last 3 Encounters:  06/02/23 164 lb (74.4 kg)  05/29/23 162 lb 3.2 oz (73.6 kg)  09/17/22 182 lb (82.6 kg)       Assessment & Plan:   Problem List Items Addressed This Visit       Unprioritized   Genital ulcer, female - Primary   Acute internal vaginal pain with ulcerative lesions likely due to herpes simplex virus. Discussed virus nature, latency, reactivation, prevalence, and antibody implications. Explained treatment options and swab test reliability. - Order viral swab for herpes simplex virus testing. - Order blood test for herpes type 2 antibodies. - Prescribe Valtrex to [facility] pharmacy. - Advise Valtrex use for current and future outbreaks. - Discuss potential daily suppressive therapy for frequent outbreaks.      Relevant Medications   valACYclovir (VALTREX) 1000 MG tablet   Other Relevant Orders   Herpes simplex virus culture   HSV(herpes simplex vrs) 1+2 ab-IgG    I am having Demaria M. Glore start on valACYclovir. I am also having her maintain her multivitamin,  cetirizine , potassium chloride  SA, metroNIDAZOLE , traZODone , diclofenac , betamethasone  dipropionate, spironolactone , amLODipine , and buPROPion .  Meds ordered this encounter  Medications   valACYclovir (VALTREX) 1000 MG tablet    Sig: Take 1 tablet (1,000 mg total) by mouth 2 (two) times daily.    Dispense:  14 tablet    Refill:  0    Supervising Provider:   Randie Bustle A [[id number]]

## 2023-06-02 NOTE — Patient Instructions (Signed)
 VISIT SUMMARY:  You came in today because of vaginal pain that started a few days ago. We discussed the possibility of herpes as the cause and have started some tests and treatment.  YOUR PLAN:  GENITAL ULCERS: You have acute internal vaginal pain with ulcerative lesions that are likely due to the herpes simplex virus. -We have ordered a viral swab for herpes simplex virus testing. -We have also ordered a blood test for herpes type 2 antibodies for confirmation. -A prescription for Valtrex has been sent to your [facility] pharmacy. -Use Valtrex as directed. -We discussed the option of daily suppressive therapy if you experience frequent outbreaks.

## 2023-06-02 NOTE — Assessment & Plan Note (Addendum)
 Acute internal vaginal pain with ulcerative lesions likely due to herpes simplex virus. Discussed virus nature, latency, reactivation, prevalence, and antibody implications. Explained treatment options and swab test reliability. - Order viral swab for herpes simplex virus testing. - Order blood test for herpes type 2 antibodies. - Prescribe Valtrex to [facility] pharmacy. - Advise Valtrex use for current and future outbreaks. - Discuss potential daily suppressive therapy for frequent outbreaks.

## 2023-06-03 ENCOUNTER — Ambulatory Visit: Admitting: Family

## 2023-06-03 LAB — HSV(HERPES SIMPLEX VRS) I + II AB-IGG
HSV 1 IGG,TYPE SPECIFIC AB: <0.9 {index}
HSV 2 IGG,TYPE SPECIFIC AB: <0.9 {index}

## 2023-06-04 ENCOUNTER — Telehealth: Payer: Self-pay | Admitting: *Deleted

## 2023-06-04 LAB — HERPES SIMPLEX VIRUS CULTURE
MICRO NUMBER:: 16448930
SPECIMEN QUALITY:: ADEQUATE

## 2023-06-04 LAB — TIQ- AMBIGUOUS ORDER

## 2023-06-04 NOTE — Telephone Encounter (Signed)
 Nicole Quinn from [name] called and he needed to know the source of the HSV culture.  Information given to [name].  Source was vaginal.

## 2023-06-05 ENCOUNTER — Ambulatory Visit: Payer: Self-pay

## 2023-06-05 DIAGNOSIS — Z8742 Personal history of other diseases of the female genital tract: Secondary | ICD-10-CM

## 2023-06-16 ENCOUNTER — Encounter: Payer: Self-pay | Admitting: Student

## 2023-07-28 DIAGNOSIS — F4323 Adjustment disorder with mixed anxiety and depressed mood: Secondary | ICD-10-CM | POA: Diagnosis not present

## 2023-08-04 ENCOUNTER — Other Ambulatory Visit: Payer: Self-pay | Admitting: Family

## 2023-08-09 ENCOUNTER — Other Ambulatory Visit: Payer: Self-pay | Admitting: Family

## 2023-08-18 ENCOUNTER — Other Ambulatory Visit (HOSPITAL_COMMUNITY)
Admission: RE | Admit: 2023-08-18 | Discharge: 2023-08-18 | Disposition: A | Discharge status: Home / Self Care | Source: Ambulatory Visit | Attending: Family | Admitting: Family

## 2023-08-18 ENCOUNTER — Encounter: Payer: Self-pay | Admitting: Family

## 2023-08-18 ENCOUNTER — Ambulatory Visit (INDEPENDENT_AMBULATORY_CARE_PROVIDER_SITE_OTHER): Admitting: Family

## 2023-08-18 VITALS — BP 115/87 | HR 78 | Temp 97.7°F | Resp 16 | Ht 64.0 in | Wt 159.0 lb

## 2023-08-18 DIAGNOSIS — E876 Hypokalemia: Secondary | ICD-10-CM | POA: Diagnosis not present

## 2023-08-18 DIAGNOSIS — N9089 Other specified noninflammatory disorders of vulva and perineum: Secondary | ICD-10-CM | POA: Diagnosis not present

## 2023-08-18 DIAGNOSIS — F4323 Adjustment disorder with mixed anxiety and depressed mood: Secondary | ICD-10-CM | POA: Diagnosis not present

## 2023-08-18 DIAGNOSIS — Z113 Encounter for screening for infections with a predominantly sexual mode of transmission: Secondary | ICD-10-CM

## 2023-08-18 DIAGNOSIS — Z0000 Encounter for general adult medical examination without abnormal findings: Secondary | ICD-10-CM

## 2023-08-18 DIAGNOSIS — R195 Other fecal abnormalities: Secondary | ICD-10-CM | POA: Diagnosis not present

## 2023-08-18 DIAGNOSIS — D649 Anemia, unspecified: Secondary | ICD-10-CM | POA: Diagnosis not present

## 2023-08-18 DIAGNOSIS — F32A Depression, unspecified: Secondary | ICD-10-CM | POA: Diagnosis not present

## 2023-08-18 LAB — COMPREHENSIVE METABOLIC PANEL WITH GFR
ALT: 10 U/L (ref 0–35)
AST: 15 U/L (ref 0–37)
Albumin: 5.1 g/dL (ref 3.5–5.2)
Alkaline Phosphatase: 87 U/L (ref 39–117)
BUN: 21 mg/dL (ref 6–23)
CO2: 30 meq/L (ref 19–32)
Calcium: 10.3 mg/dL (ref 8.4–10.5)
Chloride: 98 meq/L (ref 96–112)
Creatinine, Ser: 1.22 mg/dL — ABNORMAL HIGH (ref 0.40–1.20)
GFR: 53.24 mL/min — ABNORMAL LOW (ref >60.00)
Glucose, Bld: 63 mg/dL — ABNORMAL LOW (ref 70–99)
Potassium: 3.4 meq/L — ABNORMAL LOW (ref 3.5–5.1)
Sodium: 138 meq/L (ref 135–145)
Total Bilirubin: 0.5 mg/dL (ref 0.2–1.2)
Total Protein: 8.4 g/dL — ABNORMAL HIGH (ref 6.0–8.3)

## 2023-08-18 LAB — MAGNESIUM: Magnesium: 2.3 mg/dL (ref 1.5–2.5)

## 2023-08-18 MED ORDER — BUPROPION HCL ER (XL) 300 MG PO TB24
300.0000 mg | ORAL_TABLET | Freq: Every day | ORAL | 1 refills | Status: DC
  Filled 2024-01-10 – 2024-01-18 (×4): qty 90, 90d supply, fill #0
  Filled 2024-02-23: qty 30, 30d supply, fill #1

## 2023-08-18 MED ORDER — POTASSIUM CHLORIDE 20 MEQ/15ML (10%) PO SOLN: 60.0000 meq | Freq: Three times a day (TID) | ORAL | 0 refills | Status: DC

## 2023-08-18 NOTE — Assessment & Plan Note (Signed)
 Uncontrolled. Will increase wellbutrin  xl from 150mg  to 300mg  once daily.

## 2023-08-18 NOTE — Assessment & Plan Note (Signed)
 Will see if she can better tolerate potassium liquid instead of tablets.

## 2023-08-18 NOTE — Assessment & Plan Note (Signed)
 Immunizations reviewed and up to date. Continue healthy diet and regular exercise.  Mammo scheduled.

## 2023-08-18 NOTE — Assessment & Plan Note (Addendum)
 Had colo which noted hemorrhoids.

## 2023-08-18 NOTE — Progress Notes (Signed)
 Subjective:     Patient ID: Nicole Quinn, female    DOB: 1975-06-05, 48 y.o.   MRN: [id number]  Chief Complaint  Patient presents with   Annual Exam    HPI  Discussed the use of AI scribe software for clinical note transcription with the patient, who gave verbal consent to proceed.  History of Present Illness  Pt presents today for annual cpe. She is compliant with her potassium supplement tablets and is interested in trying liquid potassium for ease of use. She maintains a healthy diet, exercises regularly, and has experienced recent weight loss, which she finds satisfactory. She requests STI screening and follow-up on antibodies due to previous labial lesions. Her mood is fair, and she is open to increasing her Wellbutrin  dose from 150 mg to 300 mg daily.  Immunizations: up to date Diet:  good Exercise: regular Colonoscopy: up to date Pap Smear:N/A hysterectomy Mammogram: scheduled Vision: up to date Dental: up to date  Son's autopsy showed severe hyponatremia/dehydration and it was felt that his unexpected death was due to cardiac arrhythmia.     Past Medical History:  Diagnosis Date   Acute renal failure (ARF) (HCC) 10/26/2020   Allergy    Anemia    CKD (chronic kidney disease) stage 3, GFR 30-59 ml/min (HCC) 04/09/2021   Endometriosis    History of echocardiogram    Echo 6/18: EF 60-65, no RWMA, normal diastolic function   History of nuclear stress test    Myoview  6/18: EF 60, normal perfusion, Low Risk   History of UTI    Hypertension    pregnancy induced HTN/pre-eclampsia //  on meds since 2nd child (2000)    Past Surgical History:  Procedure Laterality Date   BREAST BIOPSY Left 10/01/2017   BREAST BIOPSY Right    CARPAL TUNNEL RELEASE     right   GANGLION CYST EXCISION     right   Hammer Toe Repair Bilateral 12/29/2013   Lt #3, #4, Rt #2.   TOTAL ABDOMINAL HYSTERECTOMY  2003   ovaries intat    Family History  Problem Relation Age of Onset    Arthritis Mother    Hyperlipidemia Mother    Hypertension Mother    Stroke Mother    Allergic rhinitis Mother    Hypertension Father    Diabetes Father    Arthritis Maternal Grandmother    Stroke Maternal Grandmother 17   Hypertension Maternal Grandmother    Heart attack Maternal Grandmother 62   Leukemia Paternal Grandmother    Allergies Daughter    Allergic rhinitis Daughter    Allergies Son        died from hyponatremia/cardiac arrythmia early 20's   Allergic rhinitis Son    Coronary artery disease Maternal Aunt 50   Coronary artery disease Maternal Aunt 50   Angioedema Neg Hx    Asthma Neg Hx    Eczema Neg Hx    Immunodeficiency Neg Hx    Urticaria Neg Hx     Social History   Socioeconomic History   Marital status: Married    Spouse name: Not on file   Number of children: 2   Years of education: Not on file   Highest education level: Some college, no degree  Occupational History    Employer: [employer]    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    Married.     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      Social Drivers of Health   Financial Resource Strain: Low Risk  (05/28/2023)   Overall Financial Resource Strain (CARDIA)    Difficulty of Paying Living Expenses: Not hard at all  Food Insecurity: No Food Insecurity (05/28/2023)   Hunger Vital Sign    Worried About Running Out of Food in the Last Year: Never true    Ran Out of Food in the Last Year: Never true  Transportation Needs: No Transportation Needs (05/28/2023)    PRAPARE - Administrator, Civil Service (Medical): No    Lack of Transportation (Non-Medical): No  Physical Activity: Insufficiently Active (05/28/2023)   Exercise Vital Sign    Days of Exercise per Week: 3 days    Minutes of Exercise per Session: 30 min  Stress: Stress Concern Present (05/28/2023)   [employer] of Occupational Health - Occupational Stress Questionnaire    Feeling of Stress : Very much  Social Connections: Moderately Integrated (05/28/2023)   Social Connection and Isolation Panel    Frequency of Communication with Friends and Family: More than three times a week    Frequency of Social Gatherings with Friends and Family: More than three times a week    Attends Religious Services: More than 4 times per year    Active Member of [employer] or Organizations: No    Attends Banker Meetings: Not on file    Marital Status: Married  Intimate Partner Violence: Unknown (02/28/2022)   Received from [facility]   HITS    Physically Hurt: Not on file    Insult or Talk Down To: Not on file    Threaten Physical Harm: Not on file    Scream or Curse: Not on file    Outpatient Medications Prior to Visit  Medication Sig Dispense Refill   amLODipine  (NORVASC ) 2.5 MG tablet Take 1 tablet (2.5 mg total) by mouth daily. 90 tablet 0   betamethasone  dipropionate 0.05 % cream APPLY 0.25 GRAMS TO AFFECTED AREA ON LEGS TWICE DAILY 45 g 0   cetirizine  (ZYRTEC ) 10 MG tablet Take 1 tablet (10 mg total) by mouth daily. 30 tablet 11   diclofenac  (VOLTAREN ) 75 MG EC tablet Take 1 tablet (75 mg total) by mouth 2 (two) times daily. 50 tablet 2   metroNIDAZOLE  (METROGEL ) 0.75 % vaginal gel INSERT 1 APPLICATORAFUL VAGINALLY AT BEDTIME FOR 5 DAYS 70 g 0   Multiple Vitamin (MULTIVITAMIN) tablet Take 1 tablet by mouth daily.     spironolactone  (ALDACTONE ) 50 MG tablet Take 1 tablet by mouth twice daily 60 tablet 0   traZODone  (DESYREL ) 50 MG tablet TAKE 1/2 TO 1 (ONE-HALF TO ONE) TABLET  BY MOUTH AT BEDTIME AS NEEDED FOR SLEEP 90 tablet 0   valACYclovir  (VALTREX ) 1000 MG tablet Take 1 tablet (1,000 mg total) by mouth 2 (two) times daily. 14 tablet 0   buPROPion  (WELLBUTRIN  XL) 150 MG 24 hr tablet Take 1 tablet (150 mg total) by mouth daily. 90 tablet 0   potassium  chloride SA (KLOR-CON  M) 20 MEQ tablet TAKE 3 TABLETS BY MOUTH THREE TIMES DAILY 270 tablet 0   No facility-administered medications prior to visit.    Allergies  Allergen Reactions   Amoxicillin      rash   Ace Inhibitors     REACTION: lip swelling   Shrimp Extract     Review of Systems  Constitutional:  Positive for weight loss.  HENT:  Negative for congestion and hearing loss.   Eyes:  Negative for blurred vision.  Respiratory:  Negative for cough.   Cardiovascular:  Positive for leg swelling (intermittent).  Gastrointestinal:  Negative for constipation and diarrhea.  Genitourinary:  Negative for dysuria and frequency.  Musculoskeletal:  Negative for joint pain and myalgias.  Skin:  Negative for rash.  Neurological:  Negative for headaches.  Psychiatric/Behavioral:         Mood is fair.        Objective:    Physical Exam Exam conducted with a chaperone present.  Constitutional:      General: She is not in acute distress.    Appearance: Normal appearance. She is well-developed.  HENT:     Head: Normocephalic and atraumatic.     Right Ear: Tympanic membrane, ear canal and external ear normal.     Left Ear: Tympanic membrane, ear canal and external ear normal.  Eyes:     General: No scleral icterus. Neck:     Thyroid : No thyromegaly.  Cardiovascular:     Rate and Rhythm: Normal rate and regular rhythm.     Heart sounds: Normal heart sounds. No murmur heard. Pulmonary:     Effort: Pulmonary effort is normal. No respiratory distress.     Breath sounds: Normal breath sounds. No wheezing.  Genitourinary:    Labia:        Right: No rash or lesion.        Left: No rash or lesion.    Musculoskeletal:        General: No swelling.     Cervical back: Neck supple.  Skin:    General: Skin is warm and dry.  Neurological:     Mental Status: She is alert and oriented to person, place, and time.  Psychiatric:        Mood and Affect: Mood normal.        Behavior: Behavior normal.        Thought Content: Thought content normal.        Judgment: Judgment normal.      BP 115/87 (BP Location: Right Arm, Patient Position: Sitting, Cuff Size: Normal)   Pulse 78   Temp 97.7 F (36.5 C) (Oral)   Resp 16   Ht 5' 4 (1.626 m)   Wt 159 lb (72.1 kg)   SpO2 98%   BMI 27.29 kg/m  Wt Readings from Last 3 Encounters:  08/18/23 159 lb (72.1 kg)  06/02/23 164 lb (74.4 kg)  05/29/23 162 lb 3.2 oz (73.6 kg)       Assessment & Plan:   Problem List Items Addressed This Visit       Unprioritized   Preventative health care - Primary   Immunizations reviewed and up to date. Continue healthy diet and regular exercise.  Mammo scheduled.      Hypokalemia   Will see if she can better tolerate potassium liquid instead of tablets.      Relevant Medications   potassium chloride  20 MEQ/15ML (10%) SOLN   Other Relevant Orders  Comp Met (CMET)   Magnesium    Heme positive stool   Had colo which noted hemorrhoids.       Depression   Uncontrolled. Will increase wellbutrin  xl from 150mg  to 300mg  once daily.       Relevant Medications   buPROPion  (WELLBUTRIN  XL) 300 MG 24 hr tablet   Other Visit Diagnoses       Lesion of labia       Relevant Orders   HSV 1 antibody, IgG   HSV 2 antibody, IgG     Screening examination for STI       Relevant Orders   Cervicovaginal ancillary only( [facility])   HepB+HepC+HIV Panel   RPR     Anemia, unspecified type       Relevant Orders   Iron, TIBC and Ferritin Panel       I have discontinued Natelie M. Miyasaki's buPROPion  and potassium chloride  SA. I am also having her start on buPROPion  and potassium chloride . Additionally, I  am having her maintain her multivitamin, cetirizine , metroNIDAZOLE , traZODone , diclofenac , spironolactone , amLODipine , valACYclovir , and betamethasone  dipropionate.  Meds ordered this encounter  Medications   buPROPion  (WELLBUTRIN  XL) 300 MG 24 hr tablet    Sig: Take 1 tablet (300 mg total) by mouth daily.    Dispense:  90 tablet    Refill:  1    Supervising Provider:   DOMENICA BLACKBIRD A [[id number]]   potassium chloride  20 MEQ/15ML (10%) SOLN    Sig: Take 45 mLs (60 mEq total) by mouth 3 (three) times daily.    Dispense:  4500 mL    Refill:  0    Supervising Provider:   DOMENICA BLACKBIRD A [[id number]]

## 2023-08-19 ENCOUNTER — Encounter: Payer: Self-pay | Admitting: Family

## 2023-08-19 LAB — HSV 1 ANTIBODY, IGG: HSV 1 Glycoprotein G Ab, IgG: <0.9 {index}

## 2023-08-19 LAB — RPR: RPR Ser Ql: NONREACTIVE

## 2023-08-19 LAB — IRON,TIBC AND FERRITIN PANEL
%SAT: 34 % (ref 16–45)
Ferritin: 91 ng/mL (ref 16–232)
Iron: 110 ug/dL (ref 40–190)
TIBC: 325 ug/dL (ref 250–450)

## 2023-08-19 LAB — CERVICOVAGINAL ANCILLARY ONLY
Chlamydia: NEGATIVE
Comment: NEGATIVE
Comment: NEGATIVE
Comment: NORMAL
Neisseria Gonorrhea: NEGATIVE
Trichomonas: NEGATIVE

## 2023-08-19 LAB — HEPB+HEPC+HIV PANEL
HIV Screen 4th Generation wRfx: NONREACTIVE
Hep B C IgM: NEGATIVE
Hep B Core Total Ab: NEGATIVE
Hep B E Ab: NONREACTIVE
Hep B E Ag: NEGATIVE
Hep B Surface Ab, Qual: REACTIVE
Hep C Virus Ab: NONREACTIVE
Hepatitis B Surface Ag: NEGATIVE

## 2023-08-19 LAB — HSV 2 ANTIBODY, IGG: HSV 2 Glycoprotein G Ab, IgG: <0.9 {index}

## 2023-08-20 ENCOUNTER — Other Ambulatory Visit: Payer: Self-pay | Admitting: Family

## 2023-08-20 ENCOUNTER — Ambulatory Visit: Payer: Self-pay | Admitting: Family

## 2023-08-20 DIAGNOSIS — E876 Hypokalemia: Secondary | ICD-10-CM

## 2023-08-20 MED ORDER — POTASSIUM CHLORIDE CRYS ER 20 MEQ PO TBCR: 60.0000 meq | EXTENDED_RELEASE_TABLET | Freq: Three times a day (TID) | ORAL | 5 refills | Status: DC

## 2023-08-21 ENCOUNTER — Other Ambulatory Visit: Payer: Self-pay | Admitting: Family

## 2023-08-21 DIAGNOSIS — E876 Hypokalemia: Secondary | ICD-10-CM

## 2023-08-30 DIAGNOSIS — U071 COVID-19: Secondary | ICD-10-CM | POA: Diagnosis not present

## 2023-09-05 ENCOUNTER — Other Ambulatory Visit: Payer: Self-pay | Admitting: Family

## 2023-09-05 DIAGNOSIS — E876 Hypokalemia: Secondary | ICD-10-CM

## 2023-09-10 ENCOUNTER — Inpatient Hospital Stay
Admission: RE | Admit: 2023-09-10 | Discharge: 2023-09-10 | Discharge status: Home / Self Care | Source: Ambulatory Visit | Attending: Family | Admitting: Family

## 2023-09-10 ENCOUNTER — Other Ambulatory Visit: Payer: Self-pay | Admitting: Family

## 2023-09-10 ENCOUNTER — Ambulatory Visit
Admission: RE | Admit: 2023-09-10 | Discharge: 2023-09-10 | Disposition: A | Discharge status: Home / Self Care | Source: Ambulatory Visit | Attending: Family | Admitting: Family

## 2023-09-10 DIAGNOSIS — N6489 Other specified disorders of breast: Secondary | ICD-10-CM

## 2023-09-10 DIAGNOSIS — N6320 Unspecified lump in the left breast, unspecified quadrant: Secondary | ICD-10-CM

## 2023-09-10 DIAGNOSIS — R928 Other abnormal and inconclusive findings on diagnostic imaging of breast: Secondary | ICD-10-CM | POA: Diagnosis not present

## 2023-09-10 DIAGNOSIS — N6323 Unspecified lump in the left breast, lower outer quadrant: Secondary | ICD-10-CM | POA: Diagnosis not present

## 2023-10-01 DIAGNOSIS — F4323 Adjustment disorder with mixed anxiety and depressed mood: Secondary | ICD-10-CM | POA: Diagnosis not present

## 2023-10-20 DIAGNOSIS — F4323 Adjustment disorder with mixed anxiety and depressed mood: Secondary | ICD-10-CM | POA: Diagnosis not present

## 2023-10-22 ENCOUNTER — Encounter: Payer: Self-pay | Admitting: Obstetrics and Gynecology

## 2023-10-28 DIAGNOSIS — F4323 Adjustment disorder with mixed anxiety and depressed mood: Secondary | ICD-10-CM | POA: Diagnosis not present

## 2023-10-29 ENCOUNTER — Encounter: Payer: Self-pay | Admitting: Family

## 2023-10-29 DIAGNOSIS — F4323 Adjustment disorder with mixed anxiety and depressed mood: Secondary | ICD-10-CM | POA: Diagnosis not present

## 2023-11-02 MED ORDER — FLUCONAZOLE 150 MG PO TABS: 150.0000 mg | ORAL_TABLET | Freq: Once | ORAL | 0 refills | Status: DC

## 2023-11-02 NOTE — Addendum Note (Signed)
 Addended by: DARYL SETTER on: 11/02/2023 10:05 AM   Modules accepted: Orders

## 2023-11-04 DIAGNOSIS — F4323 Adjustment disorder with mixed anxiety and depressed mood: Secondary | ICD-10-CM | POA: Diagnosis not present

## 2023-11-04 DIAGNOSIS — Z111 Encounter for screening for respiratory tuberculosis: Secondary | ICD-10-CM | POA: Diagnosis not present

## 2023-11-05 ENCOUNTER — Other Ambulatory Visit: Payer: Self-pay | Admitting: Family

## 2023-11-12 ENCOUNTER — Ambulatory Visit: Admitting: Family Medicine

## 2023-11-12 VITALS — BP 107/76 | HR 79 | Ht 64.0 in | Wt 167.0 lb

## 2023-11-12 DIAGNOSIS — N951 Menopausal and female climacteric states: Secondary | ICD-10-CM

## 2023-11-12 MED ORDER — ESTRADIOL 0.05 MG/24HR TD PTTW
1.0000 | MEDICATED_PATCH | TRANSDERMAL | 12 refills | Status: DC
  Filled 2023-12-26: qty 8, 28d supply, fill #0
  Filled 2024-01-20 (×2): qty 8, 28d supply, fill #1
  Filled 2024-01-20: qty 8, 28d supply, fill #0

## 2023-11-12 NOTE — Progress Notes (Signed)
   Acute Office Visit  Subjective:     Patient ID: Nicole Quinn, female    DOB: Feb 23, 1975, 48 y.o.   MRN: [id number]  Chief Complaint  Patient presents with   Menopause    Night sweats, hot flashes, mood swing and not feeling good     HPI  Discussed the use of AI scribe software for clinical note transcription with the patient, who gave verbal consent to proceed.  History of Present Illness Nicole Quinn is a 48 year old female who presents with symptoms suggestive of perimenopause.  She has been experiencing frequent hot flashes several times a day and night sweats that disrupt her sleep over the past six months. Additionally, she notes mood swings, vaginal dryness, and a decreased interest in sex.  Approximately 20 years ago, she underwent a hysterectomy but retains her ovaries. She is not currently on any medications except for potassium supplements, which she takes three times a day, totaling nine pills daily, due to chronic low potassium levels. Despite consultations with multiple specialists, including a nephrologist and endocrinologist, the cause of her low potassium remains undiagnosed. She has been hospitalized for severe hypokalemia, where her potassium levels continued to drop even during IV administration.  There is no family history of breast, ovarian, or uterine cancer. She has a history of abnormal mammograms since age 73, with biopsies in 2019 and 2022 showing fibroadenoma without atypical cells.  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     ROS Per HPI      Objective:    BP 107/76 (BP Location: Left Arm, Patient Position: Sitting, Cuff Size: Normal)   Pulse 79   Ht 5' 4 (1.626 m)   Wt 167 lb 0.6 oz (75.8 kg)   BMI 28.67 kg/m    Physical Exam Vitals reviewed.  Constitutional:      Appearance: Normal appearance.  Cardiovascular:     Rate and Rhythm: Normal rate and regular rhythm.  Pulmonary:     Effort: Pulmonary  effort is normal.     Breath sounds: Normal breath sounds.  Abdominal:     General: Abdomen is flat.     Palpations: Abdomen is soft.  Neurological:     General: No focal deficit present.     Mental Status: She is alert.  Psychiatric:        Mood and Affect: Mood normal.        Behavior: Behavior normal.        Thought Content: Thought content normal.        Judgment: Judgment normal.     No results found for any visits on 11/12/23.      Assessment & Plan:   Assessment and Plan Assessment & Plan Menopausal symptoms Symptoms consistent with menopause post-hysterectomy. Estrogen therapy deemed safe due to benign breast fibroadenoma history. - Prescribe biweekly estrogen patch. - Order blood tests for estrogen and FSH levels. - Advise silicone or water-based lubricants for vaginal dryness. - Schedule follow-up in 2-3 months.     Orders Placed This Encounter  Procedures   FSH   Estrogens, Total     No orders of the defined types were placed in this encounter.   Return in about 3 months (around 02/12/2024) for f/u menopausal symptoms.  Eldonna Neuenfeldt J Patriciaann Rabanal, DO

## 2023-11-13 ENCOUNTER — Ambulatory Visit: Payer: Self-pay | Admitting: Family Medicine

## 2023-11-13 LAB — FOLLICLE STIMULATING HORMONE: FSH: 66 m[IU]/mL

## 2023-11-13 LAB — ESTROGENS, TOTAL: Estrogen: 260 pg/mL

## 2023-11-17 ENCOUNTER — Ambulatory Visit: Admitting: Family

## 2023-11-18 ENCOUNTER — Ambulatory Visit: Admitting: Family

## 2023-11-18 VITALS — BP 114/79 | HR 76 | Temp 98.4°F | Resp 16 | Ht 64.0 in | Wt 170.2 lb

## 2023-11-18 DIAGNOSIS — F32A Depression, unspecified: Secondary | ICD-10-CM

## 2023-11-18 DIAGNOSIS — L309 Dermatitis, unspecified: Secondary | ICD-10-CM

## 2023-11-18 MED ORDER — DULOXETINE HCL 30 MG PO CPEP: ORAL_CAPSULE | ORAL | 0 refills | Status: DC

## 2023-11-18 MED ORDER — BETAMETHASONE DIPROPIONATE 0.05 % EX CREA
0.2500 mg | TOPICAL_CREAM | Freq: Every day | CUTANEOUS | 0 refills | Status: DC
  Filled 2023-12-25: qty 30, 30d supply, fill #0

## 2023-11-18 NOTE — Assessment & Plan Note (Signed)
 New, posterior neck. Recommended good emollient such as cetaphil and betamethasone  daily up to 2 weeks then prn.

## 2023-11-18 NOTE — Progress Notes (Signed)
 Subjective:     Patient ID: Nicole Quinn, female    DOB: [birth year], 48 y.o.   MRN: [id number]  Chief Complaint  Patient presents with   Grief reaction    Here for follow up   Depression    Patient reports increasing symptoms   Anxiety    Patient reports increasing symptoms    Depression        Past medical history includes anxiety.   Anxiety      Discussed the use of AI scribe software for clinical note transcription with the patient, who gave verbal consent to proceed.  History of Present Illness  Nicole Quinn is a 48 year old female who presents with uncontrolled symptoms of depression and anxiety. She lost her son unexpectedly in June 2024.  She feels that her  depression and anxiety symptoms have worsened over the past six months. She is currently taking Wellbutrin  XL 300 mg and has previously tried Effexor , Trintellix , Lexapro , Prozac , Zoloft , and Cymbalta . She recently started hormone therapy patches for menopause symptoms but feels that it is too early to tell how helpful it is.   She finds work helpful in managing her symptoms because it distracts her, but being at home is challenging and leads to inactivity. Her family provides support, and she attends regular counseling sessions.  She has a spreading rash on her neck resembling eczema.     11/18/2023    2:39 PM 08/18/2023   11:44 AM 08/04/2022    2:07 PM  PHQ9 SCORE ONLY  PHQ-9 Total Score 13 15  24       Data saved with a previous flowsheet row definition        Health Maintenance Due  Topic Date Due   COVID-19 Vaccine (3 - 2025-26 season) 09/21/2023    Past Medical History:  Diagnosis Date   Acute renal failure (ARF) 10/26/2020   Allergy    Anemia    CKD (chronic kidney disease) stage 3, GFR 30-59 ml/min (HCC) 04/09/2021   Endometriosis    History of echocardiogram    Echo 6/18: EF 60-65, no RWMA, normal diastolic function   History of nuclear stress test    Myoview  6/18: EF 60, normal  perfusion, Low Risk   History of UTI    Hypertension    pregnancy induced HTN/pre-eclampsia //  on meds since 2nd child (2000)    Past Surgical History:  Procedure Laterality Date   BREAST BIOPSY Left 10/01/2017   BREAST BIOPSY Right    CARPAL TUNNEL RELEASE     right   GANGLION CYST EXCISION     right   Hammer Toe Repair Bilateral 12/29/2013   Lt #3, #4, Rt #2.   TOTAL ABDOMINAL HYSTERECTOMY  2003   ovaries intat    Family History  Problem Relation Age of Onset   Arthritis Mother    Hyperlipidemia Mother    Hypertension Mother    Stroke Mother    Allergic rhinitis Mother    Hypertension Father    Diabetes Father    Arthritis Maternal Grandmother    Stroke Maternal Grandmother 34   Hypertension Maternal Grandmother    Heart attack Maternal Grandmother 74   Leukemia Paternal Grandmother    Allergies Daughter    Allergic rhinitis Daughter    Allergies Son        died from hyponatremia/cardiac arrythmia early 20's   Allergic rhinitis Son    Coronary artery disease Maternal Aunt 33   Coronary artery  disease Maternal Aunt 50   Angioedema Neg Hx    Asthma Neg Hx    Eczema Neg Hx    Immunodeficiency Neg Hx    Urticaria Neg Hx     Social History   Socioeconomic History   Marital status: Married    Spouse name: Not on file   Number of children: 2   Years of education: Not on file   Highest education level: Associate degree: occupational, scientist, product/process development, or vocational program  Occupational History    Employer: [employer]    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    Married.     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      Social Drivers of Health   Financial Resource Strain: Low Risk  (11/17/2023)   Overall Financial Resource Strain (CARDIA)    Difficulty of Paying Living Expenses: Not hard at all  Food Insecurity: No Food Insecurity (11/17/2023)   Hunger Vital Sign    Worried About Running Out of Food in the Last Year: Never true    Ran Out of Food in the Last Year: Never true  Transportation Needs: No Transportation Needs (11/17/2023)   PRAPARE - Administrator, Civil Service (Medical): No    Lack of Transportation (Non-Medical): No  Physical Activity: Sufficiently Active (11/17/2023)   Exercise Vital Sign    Days of Exercise per Week: 3 days    Minutes of Exercise per Session: 60 min  Stress: Stress Concern Present (11/17/2023)   [employer] of Occupational Health - Occupational Stress Questionnaire    Feeling of Stress: Very much  Social Connections: Moderately Integrated (11/17/2023)   Social Connection and Isolation Panel    Frequency of Communication with Friends and Family: More than three times a week    Frequency of Social Gatherings with Friends and Family: Once a week    Attends Religious Services: More than 4 times per year    Active Member of [employer] or Organizations: No    Attends Engineer, Structural: Not on file    Marital Status: Married  Intimate Partner Violence: Unknown (02/28/2022)   Received from [facility]   HITS    Physically Hurt: Not on file    Insult or Talk Down To: Not on file    Threaten Physical Harm: Not on file    Scream or Curse: Not on file    Outpatient Medications Prior to Visit  Medication Sig Dispense Refill   amLODipine  (NORVASC ) 2.5 MG tablet Take 1 tablet (2.5 mg total) by mouth daily. 90 tablet 0   buPROPion  (WELLBUTRIN  XL) 300 MG 24 hr tablet Take 1 tablet (300 mg total) by mouth daily. 90 tablet 1   cetirizine   (ZYRTEC ) 10 MG tablet Take 1 tablet (10 mg total) by mouth daily. 30 tablet 11   diclofenac  (VOLTAREN ) 75 MG EC tablet Take 1 tablet (75 mg total) by mouth 2 (two) times daily. 50 tablet 2  estradiol  (VIVELLE -DOT) 0.05 MG/24HR patch Place 1 patch (0.05 mg total) onto the skin 2 (two) times a week. 8 patch 12   metroNIDAZOLE  (METROGEL ) 0.75 % vaginal gel INSERT 1 APPLICATORAFUL VAGINALLY AT BEDTIME FOR 5 DAYS 70 g 0   Multiple Vitamin (MULTIVITAMIN) tablet Take 1 tablet by mouth daily.     potassium chloride  SA (KLOR-CON  M) 20 MEQ tablet Take 3 tablets (60 mEq total) by mouth 3 (three) times daily. 270 tablet 5   spironolactone  (ALDACTONE ) 50 MG tablet Take 1 tablet by mouth twice daily 60 tablet 0   traZODone  (DESYREL ) 50 MG tablet TAKE 1/2 TO 1 (ONE-HALF TO ONE) TABLET BY MOUTH AT BEDTIME AS NEEDED FOR SLEEP 90 tablet 0   valACYclovir  (VALTREX ) 1000 MG tablet Take 1 tablet (1,000 mg total) by mouth 2 (two) times daily. 14 tablet 0   betamethasone  dipropionate 0.05 % cream APPLY 0.25 GRAMS TO AFFECTED AREA ON LEGS TWICE DAILY 45 g 0   fluconazole  (DIFLUCAN ) 150 MG tablet TAKE ONE TABLET BY MOUTH AS A ONE-TIME DOSE (Patient not taking: Reported on 11/12/2023) 1 tablet 0   No facility-administered medications prior to visit.    Allergies  Allergen Reactions   Amoxicillin      rash   Ace Inhibitors     REACTION: lip swelling   Shrimp Extract     Review of Systems  Psychiatric/Behavioral:  Positive for depression.       See  HPI Objective:    Physical Exam Constitutional:      Appearance: Normal appearance.  Cardiovascular:     Rate and Rhythm: Normal rate.  Pulmonary:     Effort: Pulmonary effort is normal.  Skin:    Findings: Rash present. Rash is scaling (dry scaling rash posterior neck).  Neurological:     Mental Status: She is alert and oriented to person, place, and time.  Psychiatric:        Attention and Perception: Attention normal.        Mood and Affect: Affect is  tearful.        Speech: Speech normal.        Behavior: Behavior normal. Behavior is cooperative.        Cognition and Memory: Cognition normal.        Judgment: Judgment normal.      BP 114/79 (BP Location: Right Arm, Patient Position: Sitting, Cuff Size: Normal)   Pulse 76   Temp 98.4 F (36.9 C) (Oral)   Resp 16   Ht 5' 4 (1.626 m)   Wt 170 lb 3.2 oz (77.2 kg)   SpO2 100%   BMI 29.21 kg/m  Wt Readings from Last 3 Encounters:  11/18/23 170 lb 3.2 oz (77.2 kg)  11/12/23 167 lb 0.6 oz (75.8 kg)  08/18/23 159 lb (72.1 kg)       Assessment & Plan:   Problem List Items Addressed This Visit       Unprioritized   Eczema   New, posterior neck. Recommended good emollient such as cetaphil and betamethasone  daily up to 2 weeks then prn.       Relevant Medications   betamethasone  dipropionate 0.05 % cream   Depression - Primary    Depression remains uncontrolled with worsening symptoms. Current medication is Wellbutrin  XL 300 mg. PHQ-9 score improved from 15 in July to 13 today. Cymbalta  considered for weight neutrality and ease of discontinuation. - Prescribed Cymbalta , starting with 30 mg daily for one week, then increase to 60 mg  daily. Reassess in one month. - Continue Wellbutrin  XL 300 mg daily.       Relevant Medications   DULoxetine  (CYMBALTA ) 30 MG capsule    I have changed Nicole Quinn's betamethasone  dipropionate. I am also having her start on DULoxetine . Additionally, I am having her maintain her multivitamin, cetirizine , metroNIDAZOLE , traZODone , diclofenac , spironolactone , amLODipine , valACYclovir , buPROPion , potassium chloride  SA, fluconazole , and estradiol .  Meds ordered this encounter  Medications   DULoxetine  (CYMBALTA ) 30 MG capsule    Sig: 30mg  po daily for 1 week, then increase to 60mg  PO daily on week two    Dispense:  60 capsule    Refill:  0    Supervising Provider:   DOMENICA BLACKBIRD A [[id number]]   betamethasone  dipropionate 0.05 % cream    Sig:  Apply topically daily. For up to two weeks to affected area, then as needed.    Dispense:  45 g    Refill:  0    Supervising Provider:   DOMENICA BLACKBIRD A [[id number]]

## 2023-11-18 NOTE — Assessment & Plan Note (Signed)
  Depression remains uncontrolled with worsening symptoms. Current medication is Wellbutrin  XL 300 mg. PHQ-9 score improved from 15 in July to 13 today. Cymbalta  considered for weight neutrality and ease of discontinuation. - Prescribed Cymbalta , starting with 30 mg daily for one week, then increase to 60 mg daily. Reassess in one month. - Continue Wellbutrin  XL 300 mg daily.

## 2023-11-18 NOTE — Patient Instructions (Signed)
 VISIT SUMMARY:  Today, we discussed your ongoing symptoms of depression and anxiety, as well as a spreading rash on your neck. We made some adjustments to your medications and provided treatment for your rash.  YOUR PLAN:  DEPRESSION: Your depression symptoms have worsened over the past six months despite taking Wellbutrin  XL 300 mg. Your PHQ-9 score has slightly improved from 15 to 13. -Start taking Cymbalta , beginning with 30 mg daily for one week, then increase to 60 mg daily. -Continue taking Wellbutrin  XL 300 mg daily. -We will reassess your symptoms in one month.  RASH ON NECK (DERMATITIS): You have a spreading rash on your neck that looks like eczema. -Apply betamethasone  cream to the affected area once daily for two weeks. -Use a heavy, unscented moisturizer like Cetaphil to keep your skin hydrated.

## 2023-11-24 ENCOUNTER — Encounter: Payer: Self-pay | Admitting: Dietician

## 2023-11-24 ENCOUNTER — Encounter: Attending: Family | Admitting: Dietician

## 2023-11-24 VITALS — Wt 165.7 lb

## 2023-11-24 DIAGNOSIS — I10 Essential (primary) hypertension: Secondary | ICD-10-CM | POA: Insufficient documentation

## 2023-11-24 NOTE — Patient Instructions (Signed)
 Goals Established by Pt  Goal 1: on days off aim for at least 2 balanced meals and 1 snack.    When snacking, aim to include a complex carb and protein.  At meals, aim to include 1/2 plate non-starchy vegetables, 1/4 plate protein, and 1/4 plate complex carbs.   Goal 2: eat at least 2 fruits and 2 vegetables daily.   Get vitamin b12, vitamin D , and A1c tested.

## 2023-11-24 NOTE — Progress Notes (Signed)
 Medical Nutrition Therapy  Appointment Start time:  1455  Appointment End time:  1535  Primary concerns today: overall health   Referral diagnosis: Employee Visit 1 Preferred learning style: no preference indicated Learning readiness: ready   NUTRITION ASSESSMENT   Anthropometrics   Wt 11/24/23: 165.7 lb  Clinical Medical Hx: reviewed; allergies, anemia, HTN, kidney disease stage 3, prediabetes Medications: reviewed Labs: 08/18/23 GFR 53, A1c in 2023 was 5.7%, vit D in 2018 was 19.  Notable Signs/Symptoms: none Food Allergies: shrimp  Lifestyle & Dietary Hx  Pt reports she feels her eating habits are okay but notices sometimes skipping meals. Pt reports she also deals with low potassium and supplements daily but has had times where she experienced cramping or even fainting.   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 Pt reports on work days her eating is more structured, often having 3 meals per day and maybe snacks. Pt states on work days she often grabs food out on the way home. Pt states on days off she does more skipping meals, but usually has dinner at home.   Pt reports she lost her son a year ago, increasing stress in her day-to-day life. Pt reports this has made her feel that she is eating less.   Estimated daily fluid intake: 80 oz Supplements: MVI, potassium Sleep: 6-10 hours Stress / self-care: moderate Current average weekly physical activity: gym 2-3 times per week  24-Hr Dietary Recall First Meal: 6:30am: smoothie OR 9:30am: cafeteria bacon egg and cheese Snack: none Second Meal: 2pm: starch, protein, veggie Snack: none OR fruit/cheese  Third Meal: 7:30pm: salad OR [employer] OR chicken and starch and veggie Snack: none Beverages: water, 1 soda per day   NUTRITION DIAGNOSIS  NB-1.1 Food and nutrition-related knowledge deficit As related to lack of prior education by a registered dietitian.  As evidenced by pt  report.   NUTRITION INTERVENTION  Nutrition education (E-1) on the following topics:   Vitamin D  Sunlight Exposure: Spend time outdoors: Exposure to sunlight is a natural way for the body to produce vitamin D . Aim for around 10-30 minutes of sun exposure on the face, arms, and legs, at least twice a week. Time of day: Sun exposure is most effective when the sun is high in the sky (between 10 am and 3 pm).  Dietary Sources: Fatty fish: Include salmon, mackerel, and sardines in your diet. Fortified foods: Consume vitamin D -fortified foods such as fortified milk, orange juice, and cereals. Eggs: Eat egg yolks, which naturally contain vitamin D . Supplements: Vitamin D3 (cholecalciferol) is often recommended as it is more effective at raising and maintaining vitamin D  levels in the body.  Regular Monitoring: Blood tests: Periodically monitor vitamin D  levels through blood tests to assess progress and adjust supplementation if necessary.  Plate Method Fruits & Vegetables: Aim to fill half your plate with a variety of fruits and vegetables. They are rich in vitamins, minerals, and fiber, and can help reduce the risk of chronic diseases. Choose a colorful assortment of fruits and vegetables to ensure you get a wide range of nutrients. Grains and Starches: Make at least half of your grain choices whole grains, such as brown rice, whole wheat bread, and oats. Whole grains provide fiber, which aids in digestion and healthy cholesterol levels. Aim for whole forms of starchy vegetables such as potatoes, sweet potatoes, beans, peas, and corn, which are fiber rich and provide many vitamins and minerals.  Protein: Incorporate lean sources of  protein, such as poultry, fish, beans, nuts, and seeds, into your meals. Protein is essential for building and repairing tissues, staying full, balancing blood sugar, as well as supporting immune function. Dairy: Include low-fat or fat-free dairy products like milk,  yogurt, and cheese in your diet. Dairy foods are excellent sources of calcium and vitamin D , which are crucial for bone health.   Handouts Provided Include  Plate Method  Learning Style & Readiness for Change Teaching method utilized: Visual & Auditory  Demonstrated degree of understanding via: Teach Back  Barriers to learning/adherence to lifestyle change: none  Goals Established by Pt  Goal 1: on days off aim for at least 2 balanced meals and 1 snack.    When snacking, aim to include a complex carb and protein.  At meals, aim to include 1/2 plate non-starchy vegetables, 1/4 plate protein, and 1/4 plate complex carbs.   Goal 2: eat at least 2 fruits and 2 vegetables daily.   Get vitamin b12, vitamin D , and A1c tested.   MONITORING & EVALUATION Dietary intake, weekly physical activity, and follow up in 3 weeks.  Next Steps  Patient is to call for questions.

## 2023-11-30 ENCOUNTER — Encounter: Payer: Self-pay | Admitting: Family

## 2023-12-04 ENCOUNTER — Ambulatory Visit: Admitting: Dietician

## 2023-12-10 ENCOUNTER — Encounter: Payer: Self-pay | Admitting: Family

## 2023-12-11 ENCOUNTER — Encounter: Attending: Family | Admitting: Dietician

## 2023-12-11 ENCOUNTER — Encounter: Payer: Self-pay | Admitting: Dietician

## 2023-12-11 DIAGNOSIS — I10 Essential (primary) hypertension: Secondary | ICD-10-CM | POA: Insufficient documentation

## 2023-12-11 NOTE — Progress Notes (Signed)
 Medical Nutrition Therapy  Appointment Start time:  0800  Appointment End time:  0811  Primary concerns today: overall health   Referral diagnosis: Employee Visit 2 Preferred learning style: no preference indicated Learning readiness: ready   NUTRITION ASSESSMENT   Anthropometrics   Wt 11/24/23: 165.7 lb  Clinical Medical Hx: reviewed; allergies, anemia, HTN, kidney disease stage 3, prediabetes Medications: reviewed Labs: 08/18/23 GFR 53, A1c in 2023 was 5.7%, vit D in 2018 was 19.  Notable Signs/Symptoms: none Food Allergies: shrimp  Lifestyle & Dietary Hx  Pt reports she has been working on her diet/nutrition. Pt states she has been trying to incorporate more fruits for snacks, and more vegetables with meals. Pt states she has met her goal of 2 fruits and 2 veggies daily most days.   Pt reports she has been trying to cook more so that she would eat out less.   Pt reports on days off she has been able to eat 2 meals and at least 1 snack, and feels she is doing better with eating overall.    Estimated daily fluid intake: 80 oz Supplements: MVI, potassium Sleep: 6-10 hours Stress / self-care: moderate Current average weekly physical activity: gym 2-3 times per week  24-Hr Dietary Recall First Meal: 6:30am: 2 boiled eggs, bacon, strawberries Snack: pineapple Second Meal: 2pm: grilled chicken salad Snack: grapes  Third Meal: 7:30pm: chicken, cabbage, mashed potatoes Snack: none Beverages: water, 1 soda per day   NUTRITION DIAGNOSIS  NB-1.1 Food and nutrition-related knowledge deficit As related to lack of prior education by a registered dietitian.  As evidenced by pt report.   NUTRITION INTERVENTION  Nutrition education (E-1) on the following topics:   Vitamin D  Sunlight Exposure: Spend time outdoors: Exposure to sunlight is a natural way for the body to produce vitamin D . Aim for around 10-30 minutes of sun exposure on the face, arms, and legs, at least twice a  week. Time of day: Sun exposure is most effective when the sun is high in the sky (between 10 am and 3 pm).  Dietary Sources: Fatty fish: Include salmon, mackerel, and sardines in your diet. Fortified foods: Consume vitamin D -fortified foods such as fortified milk, orange juice, and cereals. Eggs: Eat egg yolks, which naturally contain vitamin D . Supplements: Vitamin D3 (cholecalciferol) is often recommended as it is more effective at raising and maintaining vitamin D  levels in the body.  Regular Monitoring: Blood tests: Periodically monitor vitamin D  levels through blood tests to assess progress and adjust supplementation if necessary.  Plate Method Fruits & Vegetables: Aim to fill half your plate with a variety of fruits and vegetables. They are rich in vitamins, minerals, and fiber, and can help reduce the risk of chronic diseases. Choose a colorful assortment of fruits and vegetables to ensure you get a wide range of nutrients. Grains and Starches: Make at least half of your grain choices whole grains, such as brown rice, whole wheat bread, and oats. Whole grains provide fiber, which aids in digestion and healthy cholesterol levels. Aim for whole forms of starchy vegetables such as potatoes, sweet potatoes, beans, peas, and corn, which are fiber rich and provide many vitamins and minerals.  Protein: Incorporate lean sources of protein, such as poultry, fish, beans, nuts, and seeds, into your meals. Protein is essential for building and repairing tissues, staying full, balancing blood sugar, as well as supporting immune function. Dairy: Include low-fat or fat-free dairy products like milk, yogurt, and cheese in your diet. Dairy foods  are excellent sources of calcium and vitamin D , which are crucial for bone health.   Handouts Provided Include (initial assessment) Plate Method  Learning Style & Readiness for Change Teaching method utilized: Visual & Auditory  Demonstrated degree of  understanding via: Teach Back  Barriers to learning/adherence to lifestyle change: none  Assessment of Previous Goals Established by Pt  Goal 1: on days off aim for at least 2 balanced meals and 1 snack.  - goal met! Great job!  When snacking, aim to include a complex carb and protein.  At meals, aim to include 1/2 plate non-starchy vegetables, 1/4 plate protein, and 1/4 plate complex carbs.   Goal 2: eat at least 2 fruits and 2 vegetables daily. - goal met, continue!!  Get vitamin b12, vitamin D , and A1c tested.   MONITORING & EVALUATION Dietary intake, weekly physical activity, and follow up PRN.   Next Steps  Patient is to call for questions.

## 2023-12-15 ENCOUNTER — Telehealth: Payer: Self-pay | Admitting: Family

## 2023-12-15 DIAGNOSIS — Z0184 Encounter for antibody response examination: Secondary | ICD-10-CM

## 2023-12-15 NOTE — Telephone Encounter (Signed)
 Form completed.

## 2023-12-15 NOTE — Telephone Encounter (Deleted)
 Please advise pt that her paperwork is requesting titers for MMR, Varicella and Hep B.  I will place future lab orders for her.

## 2023-12-22 ENCOUNTER — Other Ambulatory Visit: Payer: Self-pay

## 2023-12-22 DIAGNOSIS — F32A Depression, unspecified: Secondary | ICD-10-CM

## 2023-12-22 MED ORDER — DULOXETINE HCL 30 MG PO CPEP
ORAL_CAPSULE | ORAL | 1 refills | Status: DC
  Filled 2023-12-25: qty 60, 3d supply, fill #0
  Filled 2023-12-28: qty 7, 3d supply, fill #0
  Filled 2024-01-20: qty 60, 3d supply, fill #0

## 2023-12-23 ENCOUNTER — Other Ambulatory Visit (HOSPITAL_COMMUNITY): Payer: Self-pay

## 2023-12-24 ENCOUNTER — Other Ambulatory Visit (HOSPITAL_COMMUNITY): Payer: Self-pay

## 2023-12-24 MED ORDER — DULOXETINE HCL 30 MG PO CPEP
60.0000 mg | ORAL_CAPSULE | Freq: Every day | ORAL | 1 refills | Status: DC
  Filled 2023-12-25 – 2024-01-20 (×2): qty 60, 30d supply, fill #0
  Filled 2024-01-20: qty 60, 30d supply, fill #1

## 2023-12-24 MED ORDER — POTASSIUM CHLORIDE 20 MEQ/15ML (10%) PO SOLN
60.0000 meq | Freq: Three times a day (TID) | ORAL | 0 refills | Status: DC
  Filled 2023-12-25: qty 4257, 32d supply, fill #0

## 2023-12-25 ENCOUNTER — Other Ambulatory Visit (HOSPITAL_COMMUNITY): Payer: Self-pay

## 2023-12-25 DIAGNOSIS — Z111 Encounter for screening for respiratory tuberculosis: Secondary | ICD-10-CM | POA: Diagnosis not present

## 2023-12-27 ENCOUNTER — Other Ambulatory Visit: Payer: Self-pay

## 2023-12-28 ENCOUNTER — Other Ambulatory Visit: Payer: Self-pay

## 2023-12-28 ENCOUNTER — Other Ambulatory Visit: Payer: Self-pay | Admitting: Family

## 2023-12-28 ENCOUNTER — Other Ambulatory Visit (HOSPITAL_COMMUNITY): Payer: Self-pay

## 2023-12-28 DIAGNOSIS — Z111 Encounter for screening for respiratory tuberculosis: Secondary | ICD-10-CM | POA: Diagnosis not present

## 2023-12-28 DIAGNOSIS — E876 Hypokalemia: Secondary | ICD-10-CM

## 2023-12-28 MED ORDER — POTASSIUM CHLORIDE CRYS ER 20 MEQ PO TBCR
60.0000 meq | EXTENDED_RELEASE_TABLET | Freq: Three times a day (TID) | ORAL | 1 refills | Status: AC
  Filled 2023-12-28 (×2): qty 270, 30d supply, fill #0
  Filled 2024-02-23: qty 270, 30d supply, fill #1

## 2023-12-29 ENCOUNTER — Encounter: Payer: Self-pay | Admitting: Family

## 2023-12-29 NOTE — Telephone Encounter (Signed)
 Can you please pull her form so I can review it?

## 2024-01-11 ENCOUNTER — Other Ambulatory Visit: Payer: Self-pay

## 2024-01-11 ENCOUNTER — Other Ambulatory Visit (HOSPITAL_COMMUNITY): Payer: Self-pay

## 2024-01-12 ENCOUNTER — Other Ambulatory Visit: Payer: Self-pay

## 2024-01-18 ENCOUNTER — Other Ambulatory Visit (HOSPITAL_COMMUNITY): Payer: Self-pay

## 2024-01-18 ENCOUNTER — Other Ambulatory Visit: Payer: Self-pay

## 2024-01-19 ENCOUNTER — Other Ambulatory Visit (HOSPITAL_COMMUNITY): Payer: Self-pay

## 2024-01-19 ENCOUNTER — Other Ambulatory Visit: Payer: Self-pay

## 2024-01-19 ENCOUNTER — Other Ambulatory Visit (HOSPITAL_BASED_OUTPATIENT_CLINIC_OR_DEPARTMENT_OTHER): Payer: Self-pay

## 2024-01-20 ENCOUNTER — Other Ambulatory Visit (HOSPITAL_COMMUNITY): Payer: Self-pay

## 2024-01-20 ENCOUNTER — Other Ambulatory Visit: Payer: Self-pay

## 2024-01-28 ENCOUNTER — Other Ambulatory Visit (HOSPITAL_COMMUNITY): Payer: Self-pay

## 2024-01-28 ENCOUNTER — Ambulatory Visit: Payer: Self-pay | Admitting: Podiatry

## 2024-01-28 ENCOUNTER — Ambulatory Visit: Admitting: Family Medicine

## 2024-01-28 VITALS — BP 124/91 | HR 81 | Wt 166.0 lb

## 2024-01-28 DIAGNOSIS — N951 Menopausal and female climacteric states: Secondary | ICD-10-CM

## 2024-01-28 DIAGNOSIS — R6889 Other general symptoms and signs: Secondary | ICD-10-CM | POA: Diagnosis not present

## 2024-01-28 MED ORDER — ESTRADIOL 0.05 MG/24HR TD PTTW
2.0000 | MEDICATED_PATCH | TRANSDERMAL | 12 refills | Status: DC
  Filled 2024-01-28: qty 16, 28d supply, fill #0
  Filled 2024-02-15: qty 48, 84d supply, fill #0

## 2024-01-28 MED ORDER — ESTRADIOL 0.05 MG/24HR TD PTTW: 2.0000 | MEDICATED_PATCH | TRANSDERMAL | 12 refills | Status: DC

## 2024-01-28 NOTE — Progress Notes (Signed)
 "  Acute Office Visit  Subjective:     Patient ID: Nicole Quinn, female    DOB: 07-26-1975, 49 y.o.   MRN: [id number]  Chief Complaint  Patient presents with   Follow-up    HPI  Discussed the use of AI scribe software for clinical note transcription with the patient, who gave verbal consent to proceed.  History of Present Illness Nicole Quinn is a 49 year old female who presents with persistent sleep disruption and hot flashes.  She experiences persistent sleep disruption and hot flashes, with her sleep being disrupted by night sweats and poor sleep quality. She reports using one estrogen patch twice a week and has not noticed any improvement in her symptoms.  She also experiences ongoing fatigue, which has not improved. Despite these symptoms, she has recently been able to return to the gym, which she had previously been unable to do due to low energy and time constraints.  Her diet and appetite are reported as 'pretty good'. She has no children at home but has grandchildren living nearby.  No chest pain or breathing problems. She occasionally experiences vaginal dryness and notes the appearance of pimples occasionally after using the patches. She has not experienced any negative side effects from the estrogen patches aside from the occasional pimple.     ROS Per HPI      Objective:    BP (!) 124/91 (BP Location: Right Arm, Patient Position: Sitting, Cuff Size: Large)   Pulse 81   Wt 166 lb 0.6 oz (75.3 kg)   BMI 28.50 kg/m    Physical Exam Constitutional:      Appearance: Normal appearance.  Cardiovascular:     Rate and Rhythm: Normal rate and regular rhythm.     Pulses: Normal pulses.     Heart sounds: Normal heart sounds.  Pulmonary:     Effort: Pulmonary effort is normal.  Skin:    Capillary Refill: Capillary refill takes less than 2 seconds.  Neurological:     General: No focal deficit present.     Mental Status: She is alert.  Psychiatric:         Mood and Affect: Mood normal.        Behavior: Behavior normal.        Thought Content: Thought content normal.        Judgment: Judgment normal.     No results found for any visits on 01/28/24.      Assessment & Plan:   Assessment and Plan Assessment & Plan Menopausal symptoms Persistent symptoms despite current estrogen patch therapy. No significant side effects except occasional pimples. Vaginal dryness noted. - Increased estrogen patch to two patches per week. - Consider adding progesterone at night if sleep issues persist after 4-6 weeks. - Consider vaginal estrogen if vaginal dryness persists. - Updated prescription sent to [facility].  Difficulty losing weight Discussed dietary modifications to improve energy and manage weight. Emphasized increased protein intake and focus on nonstarchy vegetables. - Increase dietary protein intake to 100-120 grams per day. - Focus on nonstarchy vegetables. - Consider using AI tools for meal planning. - Offer referral to a dietitian if needed.     No orders of the defined types were placed in this encounter.    Meds ordered this encounter  Medications   estradiol  (VIVELLE -DOT) 0.05 MG/24HR patch    Sig: Place 2 patches (0.1 mg total) onto the skin 2 (two) times a week.    Dispense:  16  patch    Refill:  12    Return in about 3 months (around 04/27/2024) for HRT.  Dalayah Deahl J Fateh Kindle, DO  "

## 2024-02-11 ENCOUNTER — Ambulatory Visit: Admitting: Podiatry

## 2024-02-16 ENCOUNTER — Other Ambulatory Visit: Payer: Self-pay

## 2024-02-16 ENCOUNTER — Other Ambulatory Visit (HOSPITAL_COMMUNITY): Payer: Self-pay

## 2024-02-17 ENCOUNTER — Encounter (HOSPITAL_COMMUNITY): Payer: Self-pay

## 2024-02-18 ENCOUNTER — Other Ambulatory Visit (HOSPITAL_COMMUNITY): Payer: Self-pay

## 2024-02-18 ENCOUNTER — Telehealth (HOSPITAL_COMMUNITY): Payer: Self-pay | Admitting: Pharmacist

## 2024-02-19 ENCOUNTER — Other Ambulatory Visit: Payer: Self-pay | Admitting: Family Medicine

## 2024-02-19 ENCOUNTER — Encounter (HOSPITAL_COMMUNITY): Payer: Self-pay

## 2024-02-19 ENCOUNTER — Other Ambulatory Visit (HOSPITAL_COMMUNITY): Payer: Self-pay

## 2024-02-19 ENCOUNTER — Other Ambulatory Visit: Payer: Self-pay

## 2024-02-19 ENCOUNTER — Telehealth (HOSPITAL_COMMUNITY): Payer: Self-pay

## 2024-02-19 MED ORDER — ESTRADIOL 0.1 MG/24HR TD PTTW
1.0000 | MEDICATED_PATCH | TRANSDERMAL | 12 refills | Status: AC
  Filled 2024-02-19: qty 24, 84d supply, fill #0
  Filled 2024-02-19: qty 8, 28d supply, fill #0
  Filled 2024-02-23: qty 24, 84d supply, fill #1

## 2024-02-19 MED ORDER — ESTRADIOL 0.1 MG/24HR TD PTTW: 1.0000 | MEDICATED_PATCH | TRANSDERMAL | 12 refills | Status: DC

## 2024-02-19 NOTE — Telephone Encounter (Signed)
 PA request has been Received. New Encounter has been or will be created for follow up. For additional info see Pharmacy Prior Auth telephone encounter from 02/19/24.

## 2024-02-19 NOTE — Telephone Encounter (Signed)
 Pharmacy Patient Advocate Encounter   Received notification from Pt Calls Messages that prior authorization for Estradiol  0.05 mg/24 HR patch is required/requested.   Insurance verification completed.   The patient is insured through [facility].   Per test claim: Quantity limit exceeds maximum. Directions are 2 patches 2 times a week. I spoke with the Pharmacist and she said typically it is not written that way unless it is being used for infertility, which I don't see on her chart. Can you please clarify the directions.

## 2024-02-22 ENCOUNTER — Other Ambulatory Visit (HOSPITAL_COMMUNITY): Payer: Self-pay

## 2024-02-23 ENCOUNTER — Other Ambulatory Visit: Payer: Self-pay | Admitting: Family

## 2024-02-23 ENCOUNTER — Other Ambulatory Visit: Payer: Self-pay

## 2024-02-23 ENCOUNTER — Other Ambulatory Visit (HOSPITAL_COMMUNITY): Payer: Self-pay

## 2024-02-23 DIAGNOSIS — F32A Depression, unspecified: Secondary | ICD-10-CM

## 2024-02-23 MED ORDER — BUPROPION HCL ER (XL) 300 MG PO TB24
300.0000 mg | ORAL_TABLET | Freq: Every day | ORAL | 1 refills | Status: AC
  Filled 2024-02-23: qty 90, 90d supply, fill #0

## 2024-02-23 MED FILL — Duloxetine HCl Enteric Coated Pellets Cap 30 MG (Base Eq): ORAL | 30 days supply | Qty: 60 | Fill #0 | Status: CN

## 2024-02-24 ENCOUNTER — Other Ambulatory Visit (HOSPITAL_COMMUNITY): Payer: Self-pay

## 2024-03-04 ENCOUNTER — Ambulatory Visit: Admitting: Family
